# Patient Record
Sex: Female | Born: 1950
Health system: Southern US, Community
[De-identification: ages and names within clinical notes are randomized; demographics above are authoritative.]

## PROBLEM LIST (undated history)

## (undated) DIAGNOSIS — M1990 Unspecified osteoarthritis, unspecified site: Secondary | ICD-10-CM

## (undated) DIAGNOSIS — R112 Nausea with vomiting, unspecified: Secondary | ICD-10-CM

## (undated) DIAGNOSIS — E162 Hypoglycemia, unspecified: Secondary | ICD-10-CM

## (undated) DIAGNOSIS — Z0000 Encounter for general adult medical examination without abnormal findings: Secondary | ICD-10-CM

## (undated) DIAGNOSIS — R5381 Other malaise: Secondary | ICD-10-CM

## (undated) DIAGNOSIS — F419 Anxiety disorder, unspecified: Secondary | ICD-10-CM

## (undated) DIAGNOSIS — F319 Bipolar disorder, unspecified: Secondary | ICD-10-CM

## (undated) DIAGNOSIS — F32A Depression, unspecified: Secondary | ICD-10-CM

## (undated) DIAGNOSIS — Z9889 Other specified postprocedural states: Secondary | ICD-10-CM

## (undated) DIAGNOSIS — R5383 Other fatigue: Secondary | ICD-10-CM

## (undated) DIAGNOSIS — I10 Essential (primary) hypertension: Secondary | ICD-10-CM

## (undated) DIAGNOSIS — E039 Hypothyroidism, unspecified: Secondary | ICD-10-CM

## (undated) DIAGNOSIS — K219 Gastro-esophageal reflux disease without esophagitis: Secondary | ICD-10-CM

## (undated) DIAGNOSIS — E079 Disorder of thyroid, unspecified: Secondary | ICD-10-CM

## (undated) DIAGNOSIS — F4310 Post-traumatic stress disorder, unspecified: Secondary | ICD-10-CM

## (undated) HISTORY — PX: NECK SURGERY: SHX720

## (undated) HISTORY — DX: Other fatigue: R53.83

## (undated) HISTORY — DX: Other malaise: R53.81

## (undated) HISTORY — PX: EYE SURGERY: SHX253

## (undated) HISTORY — PX: TUBAL LIGATION: SHX77

## (undated) HISTORY — DX: Unspecified osteoarthritis, unspecified site: M19.90

## (undated) HISTORY — DX: Encounter for general adult medical examination without abnormal findings: Z00.00

## (undated) HISTORY — DX: Depression, unspecified: F32.A

## (undated) HISTORY — PX: DG 4TH DIGIT LEFT FOOT: HXRAD1649

## (undated) HISTORY — DX: Gastro-esophageal reflux disease without esophagitis: K21.9

---
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
## 2003-07-24 ENCOUNTER — Encounter: Admission: RE | Admit: 2003-07-24 | Discharge: 2003-07-24 | Payer: Self-pay | Admitting: Psychiatry

## 2003-12-08 ENCOUNTER — Ambulatory Visit (HOSPITAL_COMMUNITY): Payer: Self-pay | Admitting: Psychiatry

## 2004-01-22 ENCOUNTER — Ambulatory Visit (HOSPITAL_COMMUNITY): Payer: Self-pay | Admitting: Psychiatry

## 2004-05-22 ENCOUNTER — Ambulatory Visit (HOSPITAL_COMMUNITY): Payer: Self-pay | Admitting: Psychiatry

## 2004-07-24 ENCOUNTER — Ambulatory Visit (HOSPITAL_COMMUNITY): Payer: Self-pay | Admitting: Psychiatry

## 2008-02-28 ENCOUNTER — Ambulatory Visit (HOSPITAL_COMMUNITY): Payer: Self-pay | Admitting: Psychiatry

## 2009-09-05 ENCOUNTER — Ambulatory Visit (HOSPITAL_COMMUNITY): Payer: Self-pay | Admitting: Psychiatry

## 2009-10-12 ENCOUNTER — Ambulatory Visit (HOSPITAL_COMMUNITY): Payer: Self-pay | Admitting: Psychiatry

## 2009-12-03 ENCOUNTER — Ambulatory Visit (HOSPITAL_COMMUNITY): Payer: Self-pay | Admitting: Psychiatry

## 2010-01-28 ENCOUNTER — Ambulatory Visit (HOSPITAL_COMMUNITY): Payer: Self-pay | Admitting: Psychiatry

## 2010-03-25 ENCOUNTER — Encounter (HOSPITAL_COMMUNITY): Payer: Commercial Managed Care - PPO | Admitting: Psychiatry

## 2010-03-25 DIAGNOSIS — F3189 Other bipolar disorder: Secondary | ICD-10-CM

## 2010-05-20 ENCOUNTER — Encounter (HOSPITAL_COMMUNITY): Payer: Commercial Managed Care - PPO | Admitting: Psychiatry

## 2010-05-20 DIAGNOSIS — F3189 Other bipolar disorder: Secondary | ICD-10-CM

## 2010-07-15 ENCOUNTER — Encounter (HOSPITAL_COMMUNITY): Payer: Commercial Managed Care - PPO | Admitting: Psychiatry

## 2010-07-15 DIAGNOSIS — F3189 Other bipolar disorder: Secondary | ICD-10-CM

## 2010-09-16 ENCOUNTER — Encounter (HOSPITAL_COMMUNITY): Payer: Commercial Managed Care - PPO | Admitting: Psychiatry

## 2010-10-21 ENCOUNTER — Encounter (HOSPITAL_COMMUNITY): Payer: Commercial Managed Care - PPO | Admitting: Psychiatry

## 2010-10-21 DIAGNOSIS — F3189 Other bipolar disorder: Secondary | ICD-10-CM

## 2010-12-16 ENCOUNTER — Encounter (HOSPITAL_COMMUNITY): Payer: Commercial Managed Care - PPO | Admitting: Psychiatry

## 2010-12-22 ENCOUNTER — Emergency Department (HOSPITAL_BASED_OUTPATIENT_CLINIC_OR_DEPARTMENT_OTHER)
Admission: EM | Admit: 2010-12-22 | Discharge: 2010-12-22 | Disposition: A | Discharge status: Home / Self Care | Payer: 59 | Attending: Emergency Medicine | Admitting: Emergency Medicine

## 2010-12-22 ENCOUNTER — Emergency Department (INDEPENDENT_AMBULATORY_CARE_PROVIDER_SITE_OTHER): Payer: 59

## 2010-12-22 ENCOUNTER — Encounter: Payer: Self-pay | Admitting: *Deleted

## 2010-12-22 DIAGNOSIS — Z79899 Other long term (current) drug therapy: Secondary | ICD-10-CM | POA: Insufficient documentation

## 2010-12-22 DIAGNOSIS — E079 Disorder of thyroid, unspecified: Secondary | ICD-10-CM | POA: Insufficient documentation

## 2010-12-22 DIAGNOSIS — W19XXXA Unspecified fall, initial encounter: Secondary | ICD-10-CM

## 2010-12-22 DIAGNOSIS — W010XXA Fall on same level from slipping, tripping and stumbling without subsequent striking against object, initial encounter: Secondary | ICD-10-CM | POA: Insufficient documentation

## 2010-12-22 DIAGNOSIS — Y92009 Unspecified place in unspecified non-institutional (private) residence as the place of occurrence of the external cause: Secondary | ICD-10-CM | POA: Insufficient documentation

## 2010-12-22 DIAGNOSIS — F319 Bipolar disorder, unspecified: Secondary | ICD-10-CM | POA: Insufficient documentation

## 2010-12-22 DIAGNOSIS — M25539 Pain in unspecified wrist: Secondary | ICD-10-CM

## 2010-12-22 DIAGNOSIS — S63509A Unspecified sprain of unspecified wrist, initial encounter: Secondary | ICD-10-CM | POA: Insufficient documentation

## 2010-12-22 HISTORY — DX: Bipolar disorder, unspecified: F31.9

## 2010-12-22 HISTORY — DX: Disorder of thyroid, unspecified: E07.9

## 2010-12-22 HISTORY — DX: Hypoglycemia, unspecified: E16.2

## 2010-12-22 HISTORY — DX: Post-traumatic stress disorder, unspecified: F43.10

## 2010-12-22 NOTE — ED Provider Notes (Signed)
Medical screening examination/treatment/procedure(s) were performed by non-physician practitioner and as supervising physician I was immediately available for consultation/collaboration.  Hurman Horn, MD 12/22/10 [phone]

## 2010-12-22 NOTE — ED Notes (Signed)
Slipped in leaves and injured left wrist. + radial pulse. Moves fingers. Feels touch. Cap refill < 3 sec

## 2010-12-22 NOTE — ED Provider Notes (Signed)
History     CSN: [id number] Arrival date & time: 12/22/2010  5:11 PM   First MD Initiated Contact with Patient 12/22/10 1802      Chief Complaint  Patient presents with  . Wrist Injury    (Consider location/radiation/quality/duration/timing/severity/associated sxs/prior treatment) HPI Comments: Pt states that she fell in the leaves and landed on the wrist:pt states that she noticed some swelling in her wrist and it is sore with movement  Patient is a 60 y.o. female presenting with wrist injury. The history is provided by the patient. No language interpreter was used.  Wrist Injury  The incident occurred 3 to 5 hours ago. The incident occurred at home. The injury mechanism was a fall. The pain is present in the left wrist. The quality of the pain is described as aching. The pain is moderate. The pain has been constant since the incident. She reports no foreign bodies present. The symptoms are aggravated by movement.    Past Medical History  Diagnosis Date  . Bipolar disorder   . PTSD (post-traumatic stress disorder)   . Thyroid disease   . Hypoglycemia     History reviewed. No pertinent past surgical history.  History reviewed. No pertinent family history.  History  Substance Use Topics  . Smoking status: Not on file  . Smokeless tobacco: Not on file  . Alcohol Use:     OB History    Grav Para Term Preterm Abortions TAB SAB Ect Mult Living                  Review of Systems  All other systems reviewed and are negative.    Allergies  Codeine; Darvon; and Sulfa drugs cross reactors  Home Medications   Current Outpatient Rx  Name Route Sig Dispense Refill  . AMITRIPTYLINE HCL 50 MG PO TABS Oral Take 50 mg by mouth at bedtime.      . IBUPROFEN 200 MG PO TABS Oral Take 400-800 mg by mouth every 6 (six) hours as needed. For pain     . INFLUENZA VIRUS VACC SPLIT PF IM SUSP Intramuscular Inject into the muscle once.      Marland Kitchen LEVOTHYROXINE SODIUM 88 MCG PO TABS Oral  Take 88 mcg by mouth daily.      Marland Kitchen LITHIUM CARBONATE 300 MG PO CAPS Oral Take 300-600 mg by mouth 2 (two) times daily. Take 1 tab in the morning and 2 tabs at night     . PRESCRIPTION MEDICATION Topical Apply topically as needed. Psoriasis      . RISPERIDONE 2 MG PO TABS Oral Take 2 mg by mouth at bedtime.        BP 137/70  Pulse 100  Temp(Src) 98 F (36.7 C) (Oral)  Resp 18  Ht 5\' 7"  (1.702 m)  Wt 194 lb (87.998 kg)  BMI 30.38 kg/m2  SpO2 100%  Physical Exam  Nursing note and vitals reviewed. Constitutional: She is oriented to person, place, and time. She appears well-developed and well-nourished.  HENT:  Head: Normocephalic.  Cardiovascular: Normal rate and regular rhythm.   Pulmonary/Chest: Effort normal and breath sounds normal.  Musculoskeletal: Normal range of motion.       Mild swelling noted to the left wrist:pt has full WUJ:WJXBJY intact  Neurological: She is alert and oriented to person, place, and time.  Skin: Skin is warm and dry.  Psychiatric: She has a normal mood and affect.    ED Course  Procedures (including critical care time)  Labs Reviewed - No data to display Dg Wrist Complete Left  12/22/2010  *RADIOLOGY REPORT*  Clinical Data: Wrist pain secondary to a fall.  LEFT WRIST - COMPLETE 3+ VIEW  Comparison: None.  Findings: There is no fracture or dislocation.  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  IMPRESSION: No acute osseous abnormalities.  Original Report Authenticated By: Gwynn Burly, M.D.     1. Wrist sprain       MDM  Pt splinted for comfort:no acute findings noted on x-ray        Teressa Lower, NP 12/22/10 1823

## 2010-12-22 NOTE — Discharge Instructions (Signed)
Sprains Sprains are painful injuries to joints as a result of partial or complete tearing of ligaments. HOME CARE INSTRUCTIONS   For the first 24 hours, keep the injured limb raised on 2 pillows while lying down.   Apply ice bags about every 2 hours for 20 to 30 minutes, while awake, to the injured area for the first 24 hours. Then apply as directed by your caregiver. Place the ice in a plastic bag with a towel around it to prevent frostbite to the skin.   Only take over-the-counter or prescription medicines for pain, discomfort, or fever as directed by your caregiver.   If an ace bandage (a stretchy, elastic wrapping bandage) has been applied today, remove and reapply every 3 to 4 hours. Apply firm enough to keep swelling down. Donot apply tightly. Watch fingers or toes for swelling, bluish discoloration, coldness, numbness, or excessive pain. If any of these problems (symptoms) occur, remove the ace bandage and reapply it more loosely. Contact your caregiver or return to this location if these symptoms persist.  Persistent pain and inability to use the injured area for more than 2 to 3 days are warning signs. See a caregiver for a follow-up visit as soon as possible. A hairline fracture (broken bone) may not show on X-rays. Persistent pain and swelling indicate that further evaluation, use of crutches, and/or more X-rays are needed. X-rays may sometimes not show a small fracture until a week or ten days later. Make a follow-up appointment with your own caregiver or to whom we have referred you. A specialist in reading X-rays(radiologist) will re-read your X-rays. Make sure you know how to obtain your X-ray results. Do not assume everything is normal if you do not hear from [place]. SEEK IMMEDIATE MEDICAL CARE IF:  You develop severe pain or more swelling.   The pain is not controlled with medicine.   Your skin or nails below the injury turn blue or grey or feel cold or numb.  Document Released:  01/25/2000 Document Revised: 10/09/2010 Document Reviewed: 09/13/2007 [facility] Patient Information 2012 [place], [place].

## 2011-01-06 ENCOUNTER — Other Ambulatory Visit (HOSPITAL_COMMUNITY): Payer: Self-pay | Admitting: Psychiatry

## 2011-01-06 MED ORDER — RISPERIDONE 2 MG PO TABS: 2.0000 mg | ORAL_TABLET | Freq: Every day | ORAL | Status: DC

## 2011-01-06 MED ORDER — LITHIUM CARBONATE 300 MG PO TABS: 300.0000 mg | ORAL_TABLET | Freq: Every day | ORAL | Status: DC

## 2011-01-06 MED ORDER — AMITRIPTYLINE HCL 50 MG PO TABS: 50.0000 mg | ORAL_TABLET | Freq: Every day | ORAL | Status: DC

## 2011-01-13 ENCOUNTER — Ambulatory Visit (INDEPENDENT_AMBULATORY_CARE_PROVIDER_SITE_OTHER): Payer: 59 | Admitting: Psychiatry

## 2011-01-13 DIAGNOSIS — F3189 Other bipolar disorder: Secondary | ICD-10-CM

## 2011-01-13 DIAGNOSIS — Z79899 Other long term (current) drug therapy: Secondary | ICD-10-CM

## 2011-01-13 NOTE — Progress Notes (Signed)
Patient came for her followup appointment. She is going on her current medication. She is sleeping better. She has been compliant with her medication and reported no side effects. 3 weeks ago she hit her head and then complain of headache but it is resolving. Overall she has been stable on her medication. She reported no agitation anger or mood swings. She denies any angry episodes. She reported no tremors shakes or extrapyramidal side effects. She is seeing her primary care physician regularly.  Mental status examination Patient is pleasant calm cooperative. She described her mood is good and her affect is mood congruent. She maintained good eye contact. Her speech is fast but clear coherent. Her thought process is logical linear and goal-directed. She denies any active or passive suicidal thinking homicidal thinking. There no psychotic symptoms present. She denies any orderly or visual hallucination. She's alert and oriented x3. Her insight judgment and pulse control is okay  Assessment Bipolar disorder NOS  Plan I will continue her current medication which is Risperdal 2 mg at bedtime lithium 300 mg in the morning and 6 mg at bedtime amitriptyline 50 mg at bedtime. I had explained risks and benefits of medication and recommended to call if she has any question or concern. I will see her again in 2 months. I have also order routine CBC metabolic panel and lithium level.

## 2011-01-31 ENCOUNTER — Other Ambulatory Visit (HOSPITAL_COMMUNITY): Payer: Self-pay | Admitting: Physician Assistant

## 2011-01-31 DIAGNOSIS — F3189 Other bipolar disorder: Secondary | ICD-10-CM

## 2011-01-31 MED ORDER — LITHIUM CARBONATE 300 MG PO TABS: ORAL_TABLET | ORAL | Status: DC

## 2011-02-10 ENCOUNTER — Other Ambulatory Visit (HOSPITAL_COMMUNITY): Payer: Self-pay | Admitting: *Deleted

## 2011-02-10 DIAGNOSIS — F319 Bipolar disorder, unspecified: Secondary | ICD-10-CM

## 2011-02-13 MED ORDER — AMITRIPTYLINE HCL 50 MG PO TABS: 50.0000 mg | ORAL_TABLET | Freq: Every day | ORAL | Status: DC

## 2011-02-13 MED ORDER — RISPERIDONE 2 MG PO TABS: 2.0000 mg | ORAL_TABLET | Freq: Every day | ORAL | Status: DC

## 2011-03-10 ENCOUNTER — Other Ambulatory Visit (HOSPITAL_COMMUNITY): Payer: Self-pay | Admitting: Psychiatry

## 2011-03-11 ENCOUNTER — Other Ambulatory Visit (HOSPITAL_COMMUNITY): Payer: Self-pay | Admitting: Psychiatry

## 2011-03-17 ENCOUNTER — Ambulatory Visit (INDEPENDENT_AMBULATORY_CARE_PROVIDER_SITE_OTHER): Payer: 59 | Admitting: Psychiatry

## 2011-03-17 ENCOUNTER — Encounter (HOSPITAL_COMMUNITY): Payer: Self-pay | Admitting: Psychiatry

## 2011-03-17 VITALS — BP 108/84 | HR 96 | Ht 67.0 in | Wt 206.0 lb

## 2011-03-17 DIAGNOSIS — F3189 Other bipolar disorder: Secondary | ICD-10-CM

## 2011-03-17 MED ORDER — RISPERIDONE 2 MG PO TABS: 2.0000 mg | ORAL_TABLET | Freq: Every day | ORAL | Status: DC

## 2011-03-17 MED ORDER — LITHIUM CARBONATE 300 MG PO TABS: ORAL_TABLET | ORAL | Status: DC

## 2011-03-17 MED ORDER — AMITRIPTYLINE HCL 50 MG PO TABS: 50.0000 mg | ORAL_TABLET | Freq: Every day | ORAL | Status: DC

## 2011-03-17 NOTE — Progress Notes (Signed)
Patient came for her followup appointment. She's been stable on her medication. She's sleeping good however she is concerned about her weight gain. She has recently seen her primary care Dr. labs are drawn. Her WBC CBC and blood chemistries are within normal limits. Her lithium level is 0.9. She is sleeping fine. She denies any agitation anger or mood swings. She reported no side effects of medication. She is wondering if her thyroid needs to be changed since she has gained a lot of weight in 2 months.  Medical history Thyroid problem  Obesity  Vitals Reviewed  Mental status examination Patient is casually dressed and fairly groomed. She is morbid obese. She maintained good eye contact. Her speech is soft clear and coherent. Her thought process logical linear and goal-directed. Her attention and concentration is fair. She denies any active or passive suicidal thoughts or homicidal thoughts. She denies any auditory or visual hallucination. Her attention concentration is fair. She's alert and oriented x3. Her insight judgment and pulse control is okay.  Assessment Axis I bipolar disorder Axis II deferred Axis III thyroid problems, obesity Axis IV mild Axis V 65-70  Plan I have reviewed blood work which was done in December. Her labs are normal. Her lithium level is 0.9. She reported no side effects of medication. I have explained risks and benefits of medication in detail. She is scheduled to see her primary care physician to talk about adjusting the thyroid medication. If she continues to have issue with her weight we will try to lower her Risperdal in future visits. I have explained this to benefit of medication. I will see her again in 2 months.

## 2011-05-09 ENCOUNTER — Other Ambulatory Visit (HOSPITAL_COMMUNITY): Payer: Self-pay | Admitting: Psychiatry

## 2011-05-12 ENCOUNTER — Encounter (HOSPITAL_COMMUNITY): Payer: Self-pay | Admitting: Psychiatry

## 2011-05-12 ENCOUNTER — Ambulatory Visit (INDEPENDENT_AMBULATORY_CARE_PROVIDER_SITE_OTHER): Payer: 59 | Admitting: Psychiatry

## 2011-05-12 DIAGNOSIS — Z79899 Other long term (current) drug therapy: Secondary | ICD-10-CM

## 2011-05-12 DIAGNOSIS — F3189 Other bipolar disorder: Secondary | ICD-10-CM

## 2011-05-12 MED ORDER — RISPERIDONE 2 MG PO TABS: 2.0000 mg | ORAL_TABLET | Freq: Every day | ORAL | Status: DC

## 2011-05-12 MED ORDER — LITHIUM CARBONATE 300 MG PO CAPS: ORAL_CAPSULE | ORAL | Status: DC

## 2011-05-12 MED ORDER — LITHIUM CARBONATE 300 MG PO TABS: ORAL_TABLET | ORAL | Status: DC

## 2011-05-12 NOTE — Progress Notes (Signed)
Chief complaint Medication management and followup.  History of present illness Patient came today or her followup appointment. She's been compliant and stable on her medication.  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  She is relief as she is still working 40 hours a week .  She's sleeping good.  She denies any side effects of medication.  She reported her mood has been stable.  She denies any agitation anger or mood swings.  We are still waiting her blood work results from her primary care physician.  She has some tremors but she denies any other concern and does not want to take Cogentin.  She denies any paranoia or any hallucination.  Current psychiatric medication Lithium 300 mg one a morning and 2 at bedtime Risperdal 2 mg at bedtime  Medical history Thyroid problem  Obesity  Mental status examination Patient is casually dressed and fairly groomed. She is pleasant, calm and cooperative. She maintained good eye contact. Her speech is soft clear and coherent. Her thought process logical linear and goal-directed. Her attention and concentration is fair. She denies any active or passive suicidal thoughts or homicidal thoughts. She denies any auditory or visual hallucination. Her attention concentration is fair. She's alert and oriented x3. Her insight judgment and pulse control is okay.  Assessment Axis I bipolar disorder Axis II deferred Axis III thyroid problems, obesity Axis IV mild Axis V 65-70  Plan I will continue her current medication which is lithium 300 mg in the morning and 2 at bedtime and Risperdal 2 mg at bedtime. I recommend to have blood work done since we had not results from her primary care physician and even received it'll be 8 months old .  Patient acknowledged .  CBC , CMP and lithium level ordered .  I recommended to call us if she has any question or concern about the medication otherwise I will see her in 2 months.

## 2011-07-14 ENCOUNTER — Other Ambulatory Visit (HOSPITAL_COMMUNITY): Payer: Self-pay | Admitting: Psychiatry

## 2011-07-14 DIAGNOSIS — F3189 Other bipolar disorder: Secondary | ICD-10-CM

## 2011-07-21 ENCOUNTER — Ambulatory Visit (INDEPENDENT_AMBULATORY_CARE_PROVIDER_SITE_OTHER): Payer: 59 | Admitting: Internal Medicine

## 2011-07-21 ENCOUNTER — Encounter: Payer: Self-pay | Admitting: Internal Medicine

## 2011-07-21 ENCOUNTER — Telehealth: Payer: Self-pay | Admitting: Internal Medicine

## 2011-07-21 VITALS — BP 116/80 | HR 86 | Temp 98.4°F | Resp 18 | Ht 65.5 in | Wt 193.0 lb

## 2011-07-21 DIAGNOSIS — Z1239 Encounter for other screening for malignant neoplasm of breast: Secondary | ICD-10-CM

## 2011-07-21 DIAGNOSIS — Z79899 Other long term (current) drug therapy: Secondary | ICD-10-CM

## 2011-07-21 DIAGNOSIS — F319 Bipolar disorder, unspecified: Secondary | ICD-10-CM

## 2011-07-21 DIAGNOSIS — E039 Hypothyroidism, unspecified: Secondary | ICD-10-CM

## 2011-07-21 LAB — CBC WITH DIFFERENTIAL/PLATELET
Basophils Relative: 0 % (ref 0–1)
Eosinophils Absolute: 0.3 10*3/uL (ref 0.0–0.7)
Eosinophils Relative: 4 % (ref 0–5)
HCT: 41.1 % (ref 36.0–46.0)
Hemoglobin: 13.9 g/dL (ref 12.0–15.0)
MCH: 31 pg (ref 26.0–34.0)
MCHC: 33.8 g/dL (ref 30.0–36.0)
MCV: 91.7 fL (ref 78.0–100.0)
Monocytes Absolute: 0.6 10*3/uL (ref 0.1–1.0)
Monocytes Relative: 8 % (ref 3–12)

## 2011-07-21 LAB — LIPID PANEL
Cholesterol: 202 mg/dL — ABNORMAL HIGH (ref 0–200)
HDL: 44 mg/dL (ref >39)

## 2011-07-21 LAB — BASIC METABOLIC PANEL
BUN: 12 mg/dL (ref 6–23)
Calcium: 10.1 mg/dL (ref 8.4–10.5)
Creat: 1.15 mg/dL — ABNORMAL HIGH (ref 0.50–1.10)
Glucose, Bld: 100 mg/dL — ABNORMAL HIGH (ref 70–99)
Potassium: 4.7 mEq/L (ref 3.5–5.3)

## 2011-07-21 LAB — HEPATIC FUNCTION PANEL
ALT: 19 U/L (ref 0–35)
AST: 15 U/L (ref 0–37)
Alkaline Phosphatase: 98 U/L (ref 39–117)
Bilirubin, Direct: 0.1 mg/dL (ref 0.0–0.3)
Total Bilirubin: 0.4 mg/dL (ref 0.3–1.2)

## 2011-07-21 LAB — T4, FREE: Free T4: 1.05 ng/dL (ref 0.80–1.80)

## 2011-07-21 LAB — TSH: TSH: 1.255 u[IU]/mL (ref 0.350–4.500)

## 2011-07-21 NOTE — Telephone Encounter (Signed)
Lab order entered for December 2013. 

## 2011-07-21 NOTE — Patient Instructions (Signed)
Please schedule tsh,free t4 (hypothyroidism) prior to next visit

## 2011-07-22 LAB — HEMOGLOBIN A1C
Hgb A1c MFr Bld: 5.7 % — ABNORMAL HIGH (ref <5.7)
Mean Plasma Glucose: 117 mg/dL — ABNORMAL HIGH (ref <117)

## 2011-07-27 DIAGNOSIS — F319 Bipolar disorder, unspecified: Secondary | ICD-10-CM | POA: Insufficient documentation

## 2011-07-27 DIAGNOSIS — E039 Hypothyroidism, unspecified: Secondary | ICD-10-CM | POA: Insufficient documentation

## 2011-07-27 NOTE — Assessment & Plan Note (Signed)
Obtain tsh/free t4 

## 2011-07-27 NOTE — Progress Notes (Signed)
  Subjective:    Patient ID: Margaret [name], female    DOB: [birth year], 61 y.o.   MRN: [id number]  HPI Pt presents to clinic for followup of multiple medical problems. Sees psychiatry for bipolar taking lithium and states risperdal recently added. Takes ibuprofen prn without gi adverse effect. Declines screening colonoscopy.  Willing to schedule mammogram and will consider future pap smears. Believes tetanus booster ~2y ago. H/o hypothyroidism without current sx's of hypo or hyperthyroidism. Needs labs drawn here for her upcoming psychiatry appt. No active complaint  Past Medical History  Diagnosis Date  . Bipolar disorder   . PTSD (post-traumatic stress disorder)   . Thyroid disease   . Hypoglycemia    Past Surgical History  Procedure Date  . Tubal ligation     reversal    reports that she has quit smoking. She has never used smokeless tobacco. She reports that she does not drink alcohol or use illicit drugs. family history includes Cancer in her paternal grandmother; Diabetes in her mother; Esophageal cancer in her maternal grandmother; Heart failure in her mother; Hypertension in her brother; Melanoma in her brother; Parkinsonism in her father; and Prostate cancer in her father.  There is no history of Breast cancer and Colon cancer. Allergies  Allergen Reactions  . Codeine Nausea Only  . Darvon     Hallucinations   . Sulfa Drugs Cross Reactors Nausea And Vomiting      Review of Systems  Respiratory: Negative for cough and shortness of breath.   Cardiovascular: Negative for chest pain.  All other systems reviewed and are negative.       Objective:   Physical Exam  Nursing note and vitals reviewed. Constitutional: She appears well-developed and well-nourished. No distress.  HENT:  Head: Normocephalic and atraumatic.  Right Ear: External ear normal.  Left Ear: External ear normal.  Eyes: Conjunctivae are normal. No scleral icterus.  Neck: Neck supple. No thyromegaly present.   Cardiovascular: Normal rate, regular rhythm and normal heart sounds.  Exam reveals no gallop and no friction rub.   No murmur heard. Pulmonary/Chest: Effort normal and breath sounds normal. No respiratory distress. She has no wheezes. She has no rales.  Lymphadenopathy:    She has no cervical adenopathy.  Neurological: She is alert.  Skin: Skin is warm and dry. She is not diaphoretic.  Psychiatric: She has a normal mood and affect.          Assessment & Plan:

## 2011-07-27 NOTE — Assessment & Plan Note (Signed)
Obtain lithium level, chem7, lft and a1c. Cautioned re using nsaids and lithium together. States understanding

## 2011-07-28 ENCOUNTER — Inpatient Hospital Stay (HOSPITAL_BASED_OUTPATIENT_CLINIC_OR_DEPARTMENT_OTHER): Admission: RE | Admit: 2011-07-28 | Payer: 59 | Source: Ambulatory Visit

## 2011-07-28 ENCOUNTER — Ambulatory Visit: Payer: 59 | Admitting: Internal Medicine

## 2011-07-29 ENCOUNTER — Inpatient Hospital Stay (HOSPITAL_BASED_OUTPATIENT_CLINIC_OR_DEPARTMENT_OTHER): Admission: RE | Admit: 2011-07-29 | Payer: 59 | Source: Ambulatory Visit

## 2011-07-30 ENCOUNTER — Ambulatory Visit (HOSPITAL_BASED_OUTPATIENT_CLINIC_OR_DEPARTMENT_OTHER): Payer: 59

## 2011-07-30 ENCOUNTER — Other Ambulatory Visit (HOSPITAL_COMMUNITY): Payer: Self-pay | Admitting: Psychiatry

## 2011-07-31 ENCOUNTER — Ambulatory Visit (HOSPITAL_BASED_OUTPATIENT_CLINIC_OR_DEPARTMENT_OTHER)
Admission: RE | Admit: 2011-07-31 | Discharge: 2011-07-31 | Disposition: A | Discharge status: Home / Self Care | Payer: 59 | Source: Ambulatory Visit | Attending: Internal Medicine | Admitting: Internal Medicine

## 2011-07-31 DIAGNOSIS — Z1239 Encounter for other screening for malignant neoplasm of breast: Secondary | ICD-10-CM

## 2011-07-31 DIAGNOSIS — Z1231 Encounter for screening mammogram for malignant neoplasm of breast: Secondary | ICD-10-CM | POA: Insufficient documentation

## 2011-08-11 ENCOUNTER — Ambulatory Visit (HOSPITAL_COMMUNITY): Payer: 59 | Admitting: Psychiatry

## 2011-09-01 ENCOUNTER — Ambulatory Visit (INDEPENDENT_AMBULATORY_CARE_PROVIDER_SITE_OTHER): Payer: 59 | Admitting: Psychiatry

## 2011-09-01 ENCOUNTER — Encounter (HOSPITAL_COMMUNITY): Payer: Self-pay | Admitting: Psychiatry

## 2011-09-01 DIAGNOSIS — F319 Bipolar disorder, unspecified: Secondary | ICD-10-CM

## 2011-09-01 DIAGNOSIS — F3189 Other bipolar disorder: Secondary | ICD-10-CM

## 2011-09-01 LAB — LITHIUM LEVEL: Lithium Lvl: 1.04 mEq/L (ref 0.80–1.40)

## 2011-09-01 MED ORDER — LITHIUM CARBONATE 300 MG PO CAPS: ORAL_CAPSULE | ORAL | Status: DC

## 2011-09-01 MED ORDER — RISPERIDONE 2 MG PO TABS: 2.0000 mg | ORAL_TABLET | Freq: Every day | ORAL | Status: DC

## 2011-09-01 MED ORDER — AMITRIPTYLINE HCL 50 MG PO TABS: 50.0000 mg | ORAL_TABLET | Freq: Every day | ORAL | Status: DC

## 2011-09-01 NOTE — Progress Notes (Signed)
Chief complaint I have noticed more agitation and mood swing.    History of present illness Patient came today or her followup appointment.  She has notice more mood swing agitation and anger in past few weeks.  She also endorse frequent urination however she believed due to stress at work.  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  Now she is working to job .  She does not like her first Job and reported stress there.  She admitted poor sleep and racing thoughts.  She denies any side effects other than noticed more frequent urination .  She admitted getting easily irritable and frustrated.  However she denies any active or passive suicidal thoughts or homicidal thoughts.  She had blood work which was done in June .  Her hemoglobin A1c is 5.7  , her TSH is 1.25 and cholesterol 200.  Her creatinine is 1.15 and BUN is 12.  Her hepatic function was normal.  However she did not have any lithium level.  Patient denies any tremors or shakes.  Current psychiatric medication Lithium 300 mg one a morning and 2 at bedtime Risperdal 2 mg at bedtime  Medical history Thyroid problem  Obesity  Mental status examination Patient is casually dressed and fairly groomed. She is cooperative and maintained fair eye contact.  She described her mood sometimes irritable and her affect is mood appropriate.  She denies any active or passive suicidal thoughts or homicidal thoughts.  Her attention and concentration is fair.  Her speech is fast but relevant.  There were no delusion or hallucination present at this time.  She denies any tremors or shakes.  Her thought processes fast.  She's alert and oriented x3.  Her insight judgment and pulse control is fair.  Assessment Axis I bipolar disorder Axis II deferred Axis III thyroid problems, obesity Axis IV mild Axis V 65-70  Plan I discuss the patient about the side effects , response to the medication , blood results especially creatinine.  We do not have  any lithium level .  I believe patient may have some side effects of lithium causing frequent urination .  We will do lithium level since it has been not done.  I also discussed with her option regarding medication.  We discuss about Lamictal however we will get lithium level first.  For now we'll continue her current psychiatric medication however once she had a lithium level I will see her again in 3 weeks.  We may consider either increasing Risperdal or change in lithium to Lamictal.  I recommend to call us if she is any question or concern or if she feels worsening of the symptoms.  I will see her again in 3 weeks.  Time spent 30 minutes.

## 2011-09-04 ENCOUNTER — Telehealth: Payer: Self-pay | Admitting: Internal Medicine

## 2011-09-04 NOTE — Telephone Encounter (Signed)
She can schedule OV to have ears checked & we can irrigate with MD request/SLS

## 2011-09-05 NOTE — Telephone Encounter (Signed)
Patient returned phone call. She states that she will call back when ready to schedule appointment

## 2011-09-15 ENCOUNTER — Ambulatory Visit (HOSPITAL_COMMUNITY): Payer: Self-pay | Admitting: Psychiatry

## 2011-09-29 ENCOUNTER — Encounter (HOSPITAL_COMMUNITY): Payer: Self-pay | Admitting: Psychiatry

## 2011-09-29 ENCOUNTER — Ambulatory Visit (INDEPENDENT_AMBULATORY_CARE_PROVIDER_SITE_OTHER): Payer: 59 | Admitting: Psychiatry

## 2011-09-29 VITALS — BP 137/77 | HR 82 | Wt 199.2 lb

## 2011-09-29 DIAGNOSIS — F3189 Other bipolar disorder: Secondary | ICD-10-CM

## 2011-09-29 MED ORDER — RISPERIDONE 2 MG PO TABS: 2.0000 mg | ORAL_TABLET | Freq: Every day | ORAL | Status: DC

## 2011-09-29 MED ORDER — LITHIUM CARBONATE 300 MG PO CAPS: ORAL_CAPSULE | ORAL | Status: DC

## 2011-09-29 MED ORDER — AMITRIPTYLINE HCL 50 MG PO TABS: 50.0000 mg | ORAL_TABLET | Freq: Every day | ORAL | Status: DC

## 2011-09-29 NOTE — Progress Notes (Signed)
Chief complaint I stress at work but I'm doing good.     History of present illness Patient came today or her followup appointment.  She endorse increased stress at work but overall her anger and mood has been better from the past.  She's compliant with the medication and reported no side effects.  Her lithium level is 1.1 on July 22.  It is therapeutic.  Patient admitted less agitation and anger .  She still has frequent urination at night but she is very resistant to change her lithium.  She's been taking lithium since 1970s and had a very good response.  Apart from frequent urination she sleeping better.  She's working to job and has been very busy.  She denies recent agitation anger mood swing.  She denies any active or passive suicidal thoughts.  She had blood work in June 2030 which shows hemoglobin A1c is 5.7  , her TSH is 1.25 and cholesterol 200.  Her creatinine is 1.15 and BUN is 12.  Her hepatic function was normal.  She's not drinking or using any illegal substance.  She doesn't have any tremors or shakes. We had discussed on the last visit that if she continued to have frequent urination then we can consider either increasing Risperdal or switching to Lamictal however patient does not want to change her medication.  Current psychiatric medication Lithium 300 mg one a morning and 2 at bedtime Risperdal 2 mg at bedtime Amitriptyline 50 mg at bedtime.  Past psychiatric history Patient has a long history of bipolar disorder.  She is been admitted twice due to manic symptoms.  Her last psychiatric admission was 10 years ago.  In the past she had tried Abilify with limited response.  She's been taking lithium since 1970s.  She denies any history of suicidal attempt.  Medical history Thyroid problem  Obesity  Psychosocial history Patient has been separated since 2005.  She is single and lives alone.  Mental status examination Patient is casually dressed and fairly groomed. She is  cooperative and maintained fair eye contact.  She described her mood good and her affect is labile.  Her speech is fast but clear and coherent.  Her thought process is logical linear and goal-directed.  Her attention and concentration is distracted at times but overall she is relevant in conversation.  She's alert and oriented x3.  There were no delusion or psychotic symptoms present at this time.  She denies any active or passive suicidal thoughts or homicidal thoughts.  She's oriented x3.  Her insight judgment and pulse control is okay.  Assessment Axis I bipolar disorder Axis II deferred Axis III thyroid problems, obesity Axis IV mild Axis V 65-70  Plan I reviewed chart, last progress note and recent lithium level.  Patient is comfortable on her current psychiatric medication.  She does not want to change her lithium.  We'll continue her current psychiatric medication.  She has therapeutic level of lithium.  I recommend to call us if she is a question or concern about the medication or if she feels worsening of the symptoms.  I will see her again in 2 months.  Time spent 30 minutes.  Portion of this note is generated with voice dictation software and may contain typographical error. Marland Kitchen

## 2011-10-01 ENCOUNTER — Other Ambulatory Visit (HOSPITAL_COMMUNITY): Payer: Self-pay | Admitting: Psychiatry

## 2011-12-01 ENCOUNTER — Ambulatory Visit (HOSPITAL_COMMUNITY): Payer: Self-pay | Admitting: Psychiatry

## 2011-12-08 ENCOUNTER — Ambulatory Visit: Payer: Self-pay | Admitting: Family

## 2011-12-15 ENCOUNTER — Ambulatory Visit (INDEPENDENT_AMBULATORY_CARE_PROVIDER_SITE_OTHER): Payer: 59 | Admitting: Psychiatry

## 2011-12-15 ENCOUNTER — Encounter (HOSPITAL_COMMUNITY): Payer: Self-pay | Admitting: Psychiatry

## 2011-12-15 VITALS — BP 122/75 | HR 86 | Wt 200.4 lb

## 2011-12-15 DIAGNOSIS — F319 Bipolar disorder, unspecified: Secondary | ICD-10-CM

## 2011-12-15 DIAGNOSIS — F3189 Other bipolar disorder: Secondary | ICD-10-CM

## 2011-12-15 MED ORDER — AMITRIPTYLINE HCL 50 MG PO TABS: 50.0000 mg | ORAL_TABLET | Freq: Every day | ORAL | Status: DC

## 2011-12-15 MED ORDER — LITHIUM CARBONATE 300 MG PO CAPS: ORAL_CAPSULE | ORAL | Status: DC

## 2011-12-15 MED ORDER — RISPERIDONE 2 MG PO TABS: 2.0000 mg | ORAL_TABLET | Freq: Every day | ORAL | Status: DC

## 2011-12-15 NOTE — Progress Notes (Signed)
Chief complaint I have some time jerks in my right arm but I'm doing better on the current medication.     History of present illness Patient is 61 year old Caucasian female who came for her followup appointment.  She's been compliant with the medication.  She reports better mood despite his stress at work.  She has noticed some time jerking movements in her right arm which happens few times in the past.  She is not concern about these moments but wondering what is the cause.  I explain some time long use of lithium and Risperdal may give these jerky movements.  Patient does not want to stop her lithium and Risperdal.  She have felt best .  These medication.  I also recommend to see neurologist if the moments continued to get more intense and frequent.  Patient acknowledged.  She like to get retired next year and hoping at that time she may come off from the medication.  She sleeping better.  She denies any recent agitation anger mood swing.  She's also concerned about her left ear hearing loss .  She was told that she has psoriasis and it runs in her family.  Patient does not have any additional appointment with her rheumatologist for further workup.  Her last lithium level was done in July 2013 which was 1.1.  Her creatinine was 1.15 and her BUN is 12.  Her hemoglobin A1c is 5.7 and TSH 1.25 at bedtime.  Patient wants to keep her current medication.  She's not drinking or using any illegal substance.  Current psychiatric medication Lithium 300 mg one a morning and 2 at bedtime Risperdal 2 mg at bedtime Amitriptyline 50 mg at bedtime.  Past psychiatric history Patient has a long history of bipolar disorder.  She is been admitted twice due to manic symptoms.  Her last psychiatric admission was 10 years ago.  In the past she had tried Abilify with limited response.  She's been taking lithium since 1970s.  She denies any history of suicidal attempt.  Medical history Thyroid problem   Obesity  Psychosocial history Patient has been separated since 2005.  She is single and lives alone.  Patient worked at [employer].  Mental status examination Patient is casually dressed and fairly groomed. She is cooperative and maintained fair eye contact.  She is relevant in conversation.  She described her mood is good and her affect is mood appropriate.  Her speech is soft clear and coherent.  Her thought process logical linear and goal-directed.  I do not notice any tremors or shakes at this time.  Her thought processes logical goal-directed.  Her attention and concentration is fair.  She's alert and oriented x3.  She denies any active or passive suicidal thoughts or homicidal thoughts.  She denies any auditory or visual hallucination.  She's alert and oriented x3.  There no psychotic symptoms present at this time.  Her insight judgment and impulse control is okay.  Assessment Axis I bipolar disorder Axis II deferred Axis III thyroid problems, obesity Axis IV mild Axis V 65-70  Plan I encourage her to discuss her primary care physician and do further workup for her right ear hearing loss.  I also encouraged her to see neurologist for jerky movements.  However patient like to monitor if the moments get worse .  She like to continue her current psychiatric medication.  I explained the risk and benefits of medication in detail.  I recommend to call  us if she is any question or concern if she feels worsening of the symptom.  I will see him again in 2 months.  We will consider lithium level and creatinine on her next visit.    Portion of this note is generated with voice dictation software and may contain typographical error. Marland Kitchen

## 2011-12-23 ENCOUNTER — Encounter: Payer: Self-pay | Admitting: Internal Medicine

## 2012-01-05 ENCOUNTER — Ambulatory Visit (INDEPENDENT_AMBULATORY_CARE_PROVIDER_SITE_OTHER): Payer: 59 | Admitting: Internal Medicine

## 2012-01-05 ENCOUNTER — Other Ambulatory Visit (HOSPITAL_COMMUNITY)
Admission: RE | Admit: 2012-01-05 | Discharge: 2012-01-05 | Disposition: A | Discharge status: Home / Self Care | Payer: 59 | Source: Ambulatory Visit | Attending: Internal Medicine | Admitting: Internal Medicine

## 2012-01-05 ENCOUNTER — Encounter: Payer: Self-pay | Admitting: Internal Medicine

## 2012-01-05 VITALS — BP 118/76 | HR 84 | Temp 97.8°F | Resp 16 | Wt 201.2 lb

## 2012-01-05 DIAGNOSIS — Z124 Encounter for screening for malignant neoplasm of cervix: Secondary | ICD-10-CM

## 2012-01-05 DIAGNOSIS — E7800 Pure hypercholesterolemia, unspecified: Secondary | ICD-10-CM

## 2012-01-05 DIAGNOSIS — E039 Hypothyroidism, unspecified: Secondary | ICD-10-CM

## 2012-01-05 DIAGNOSIS — Z01419 Encounter for gynecological examination (general) (routine) without abnormal findings: Secondary | ICD-10-CM | POA: Insufficient documentation

## 2012-01-05 DIAGNOSIS — Z79899 Other long term (current) drug therapy: Secondary | ICD-10-CM

## 2012-01-05 DIAGNOSIS — E785 Hyperlipidemia, unspecified: Secondary | ICD-10-CM

## 2012-01-05 DIAGNOSIS — F319 Bipolar disorder, unspecified: Secondary | ICD-10-CM

## 2012-01-05 NOTE — Assessment & Plan Note (Signed)
Obtain fasting lipid profile 

## 2012-01-05 NOTE — Assessment & Plan Note (Signed)
Obtain TSH and free T4 

## 2012-01-05 NOTE — Assessment & Plan Note (Signed)
Obtain Chem-7 because of lithium use

## 2012-01-05 NOTE — Progress Notes (Signed)
  Subjective:    Patient ID: Margaret [name], female    DOB: [birth year], 61 y.o.   MRN: [id number]  HPI Pt presents to clinic for followup of multiple medical problems and for Pap smear. History of hypothyroidism and due for recheck of TSH and free T4. Bipolar followed by psychiatry and maintained on lithium. No active complaint  Past Medical History  Diagnosis Date  . Bipolar disorder   . PTSD (post-traumatic stress disorder)   . Thyroid disease   . Hypoglycemia    Past Surgical History  Procedure Date  . Tubal ligation     reversal    reports that she has quit smoking. She has never used smokeless tobacco. She reports that she does not drink alcohol or use illicit drugs. family history includes Cancer in her paternal grandmother; Diabetes in her mother; Esophageal cancer in her maternal grandmother; Heart failure in her mother; Hypertension in her brother; Melanoma in her brother; Parkinsonism in her father; and Prostate cancer in her father.  There is no history of Breast cancer and Colon cancer. Allergies  Allergen Reactions  . Codeine Nausea Only  . Darvon     Hallucinations   . Sulfa Drugs Cross Reactors Nausea And Vomiting     Review of Systems see history of present illness     Objective:   Physical Exam  Nursing note and vitals reviewed. Constitutional: She appears well-developed and well-nourished. No distress.  HENT:  Head: Normocephalic and atraumatic.  Eyes: Conjunctivae normal are normal. No scleral icterus.  Genitourinary:       With female nurse escort exam was performed. External genitalia within normal limits. Speculum exam performed with suboptimal visualization of cervix however patient notes pain during today's exam as well as all past pelvic exams. No further speculum maneuvering was attempted. Pap smear sample obtained from what appeared to be distal cervix. Also breast exam performed. No axillary adenopathy, nipple discharge nipple retraction or obvious breast  mass appreciated.  Neurological: She is alert.  Skin: Skin is warm and dry. She is not diaphoretic.  Psychiatric: She has a normal mood and affect.          Assessment & Plan:

## 2012-01-05 NOTE — Assessment & Plan Note (Signed)
Pap smear pending.

## 2012-01-06 LAB — TSH: TSH: 1.252 u[IU]/mL (ref 0.350–4.500)

## 2012-01-06 LAB — BASIC METABOLIC PANEL
Calcium: 10.5 mg/dL (ref 8.4–10.5)
Creat: 1.14 mg/dL — ABNORMAL HIGH (ref 0.50–1.10)

## 2012-01-06 LAB — LIPID PANEL
Cholesterol: 167 mg/dL (ref 0–200)
HDL: 47 mg/dL (ref >39)
Triglycerides: 98 mg/dL (ref <150)

## 2012-01-06 LAB — T4, FREE: Free T4: 1.38 ng/dL (ref 0.80–1.80)

## 2012-01-14 ENCOUNTER — Other Ambulatory Visit: Payer: Self-pay | Admitting: *Deleted

## 2012-01-14 MED ORDER — LEVOTHYROXINE SODIUM 88 MCG PO TABS: 88.0000 ug | ORAL_TABLET | Freq: Every day | ORAL | Status: DC

## 2012-01-14 NOTE — Telephone Encounter (Signed)
Rx to pharmacy/[facility] 

## 2012-01-19 ENCOUNTER — Ambulatory Visit: Payer: 59 | Admitting: Internal Medicine

## 2012-02-11 HISTORY — PX: OTHER SURGICAL HISTORY: SHX169

## 2012-03-01 ENCOUNTER — Other Ambulatory Visit (HOSPITAL_COMMUNITY): Payer: Self-pay | Admitting: *Deleted

## 2012-03-01 ENCOUNTER — Ambulatory Visit (HOSPITAL_COMMUNITY): Payer: Self-pay | Admitting: Psychiatry

## 2012-03-01 DIAGNOSIS — F3189 Other bipolar disorder: Secondary | ICD-10-CM

## 2012-03-01 MED ORDER — LITHIUM CARBONATE 300 MG PO CAPS: ORAL_CAPSULE | ORAL | Status: DC

## 2012-03-08 ENCOUNTER — Ambulatory Visit (INDEPENDENT_AMBULATORY_CARE_PROVIDER_SITE_OTHER): Payer: 59 | Admitting: Psychiatry

## 2012-03-08 ENCOUNTER — Encounter (HOSPITAL_COMMUNITY): Payer: Self-pay | Admitting: Psychiatry

## 2012-03-08 DIAGNOSIS — F319 Bipolar disorder, unspecified: Secondary | ICD-10-CM

## 2012-03-08 DIAGNOSIS — F3189 Other bipolar disorder: Secondary | ICD-10-CM

## 2012-03-08 MED ORDER — AMITRIPTYLINE HCL 50 MG PO TABS: 50.0000 mg | ORAL_TABLET | Freq: Every day | ORAL | Status: DC

## 2012-03-08 MED ORDER — RISPERIDONE 2 MG PO TABS: 2.0000 mg | ORAL_TABLET | Freq: Every day | ORAL | Status: DC

## 2012-03-08 NOTE — Progress Notes (Signed)
Chief complaint I'm doing better on the medication.  I need refills.     History of present illness Patient is 62 year old Caucasian female who came for her followup appointment.  She's been compliant with the medication.  She denies any side effects of medication.  She does not have any jerky movements and past few months.  She stop using ibuprofen on a daily basis due to the concern of kidney and lithium.  She has recently seen her primary care physician and her thyroid was normal.  Overall her mood has been stable.  She denies any agitation anger mood swing.  She denies any tremors or shakes.  She sleeps better.  She has some stress from her work but there has been no issues recently. She's not drinking or using any illegal substance.  Current psychiatric medication Lithium 300 mg one a morning and 2 at bedtime Risperdal 2 mg at bedtime Amitriptyline 50 mg at bedtime.  Past psychiatric history Patient has a long history of bipolar disorder.  She is been admitted twice due to manic symptoms.  Her last psychiatric admission was 10 years ago.  In the past she had tried Abilify with limited response.  She's been taking lithium since 1970s.  She denies any history of suicidal attempt.  Medical history Thyroid problem  Obesity  Psychosocial history Patient has been separated since 2005.  She is single and lives alone.  Patient worked at [employer].  Mental status examination Patient is casually dressed and fairly groomed. She is cooperative and maintained fair eye contact.  She is relevant in conversation.  She described her mood is good and her affect is mood appropriate.  Her speech is soft clear and coherent.  Her thought process logical linear and goal-directed.  I do not notice any tremors or shakes at this time.  Her thought processes logical goal-directed.  Her attention and concentration is fair.  She's alert and oriented x3.  She denies any active or passive  suicidal thoughts or homicidal thoughts.  She denies any auditory or visual hallucination.  She's alert and oriented x3.  There no psychotic symptoms present at this time.  Her insight judgment and impulse control is okay.  Assessment Axis I bipolar disorder Axis II deferred Axis III thyroid problems, obesity Axis IV mild Axis V 65-70  Plan I will continue her current psychiatric medication.  I encourage her to call us if she is any question or concern if he feel worsening of the symptom.  Risk and benefits of medication explain in detail.  Will do lithium level on her next visit.  I will see her again in 3 months.  Recommend call when necessary.  Portion of this note is generated with voice dictation software and may contain typographical error. Marland Kitchen

## 2012-04-01 ENCOUNTER — Other Ambulatory Visit (HOSPITAL_COMMUNITY): Payer: Self-pay | Admitting: Psychiatry

## 2012-04-01 NOTE — Telephone Encounter (Signed)
Given script on 03/08/12 with two additional refills. Too soon to refill

## 2012-04-28 ENCOUNTER — Other Ambulatory Visit (HOSPITAL_COMMUNITY): Payer: Self-pay | Admitting: Psychiatry

## 2012-04-29 ENCOUNTER — Other Ambulatory Visit (HOSPITAL_COMMUNITY): Payer: Self-pay | Admitting: Psychiatry

## 2012-04-29 DIAGNOSIS — F3189 Other bipolar disorder: Secondary | ICD-10-CM

## 2012-04-29 MED ORDER — LITHIUM CARBONATE 300 MG PO CAPS: ORAL_CAPSULE | ORAL | Status: DC

## 2012-05-21 ENCOUNTER — Encounter: Payer: Self-pay | Admitting: Internal Medicine

## 2012-05-31 ENCOUNTER — Other Ambulatory Visit (HOSPITAL_COMMUNITY): Payer: Self-pay | Admitting: Psychiatry

## 2012-05-31 DIAGNOSIS — F3189 Other bipolar disorder: Secondary | ICD-10-CM

## 2012-06-07 ENCOUNTER — Ambulatory Visit (INDEPENDENT_AMBULATORY_CARE_PROVIDER_SITE_OTHER): Payer: 59 | Admitting: Psychiatry

## 2012-06-07 ENCOUNTER — Encounter (HOSPITAL_COMMUNITY): Payer: Self-pay | Admitting: Psychiatry

## 2012-06-07 VITALS — BP 120/80 | HR 88 | Wt 207.0 lb

## 2012-06-07 DIAGNOSIS — F3189 Other bipolar disorder: Secondary | ICD-10-CM

## 2012-06-07 DIAGNOSIS — F319 Bipolar disorder, unspecified: Secondary | ICD-10-CM

## 2012-06-07 DIAGNOSIS — Z79899 Other long term (current) drug therapy: Secondary | ICD-10-CM

## 2012-06-07 LAB — CBC WITH DIFFERENTIAL/PLATELET
Basophils Absolute: 0.1 10*3/uL (ref 0.0–0.1)
HCT: 39.2 % (ref 36.0–46.0)
Hemoglobin: 13.3 g/dL (ref 12.0–15.0)
Lymphocytes Relative: 19 % (ref 12–46)
Monocytes Absolute: 0.8 10*3/uL (ref 0.1–1.0)
Neutro Abs: 6 10*3/uL (ref 1.7–7.7)
RBC: 4.36 MIL/uL (ref 3.87–5.11)
RDW: 14 % (ref 11.5–15.5)
WBC: 9.1 10*3/uL (ref 4.0–10.5)

## 2012-06-07 LAB — HEMOGLOBIN A1C: Mean Plasma Glucose: 114 mg/dL (ref <117)

## 2012-06-07 MED ORDER — RISPERIDONE 2 MG PO TABS: 2.0000 mg | ORAL_TABLET | Freq: Every day | ORAL | Status: DC

## 2012-06-07 MED ORDER — AMITRIPTYLINE HCL 50 MG PO TABS: 50.0000 mg | ORAL_TABLET | Freq: Every day | ORAL | Status: DC

## 2012-06-07 MED ORDER — LITHIUM CARBONATE 300 MG PO CAPS: ORAL_CAPSULE | ORAL | Status: DC

## 2012-06-07 NOTE — Progress Notes (Signed)
[facility] [id number] Progress Note  ELLYCE LAFEVERS [id number] 62 y.o.  06/07/2012 10:30 AM  Chief Complaint: I am thinking to retire.  I like my medication.  History of Present Illness: Patient is 62 year old Caucasian female who came for her followup appointment.  She is compliant with her medication and denies any side effects.  She is tending to get her apartment in July.  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  She is also in the process of buying a house.  She is very excited and happy about it.  She is hoping to have closing in few weeks.  She sleeps better.  She denies any agitation anger or mood swings.  There were no tremors or shakes.  She is not drinking or using any illegal substance.  She's not taking ibuprofen since she was told that it may cause high creatinine as patient is taking lithium.  Patient denies any paranoia or any hallucination.  Suicidal Ideation: No Plan Formed: No Patient has means to carry out plan: No  Homicidal Ideation: No Plan Formed: No Patient has means to carry out plan: No  Review of Systems  Constitutional: Negative.   Respiratory: Negative.   Cardiovascular: Negative.   Gastrointestinal: Negative.   Musculoskeletal: Positive for back pain.  Neurological: Positive for headaches.   Psychiatric: Agitation: No Hallucination: No Depressed Mood: No Insomnia: No Hypersomnia: No Altered Concentration: No Feels Worthless: No Grandiose Ideas: No Belief In Special Powers: No New/Increased Substance Abuse: No Compulsions: No  Neurologic: Headache: Yes Seizure: No Paresthesias: No  Medical History: Patient has a history of bladder problems, obesity and chronic back pain.  Psychosocial history. She is separated since 2005.  She is a single and lives alone.  Outpatient Encounter Prescriptions as of 06/07/2012  Medication Sig Dispense Refill  . amitriptyline (ELAVIL) 50 MG tablet Take 1 tablet (50 mg total) by mouth at bedtime.  30  tablet  2  . influenza  inactive virus vaccine (FLUZONE/FLUARIX) injection Inject into the muscle once.        Marland Kitchen levothyroxine (SYNTHROID, LEVOTHROID) 88 MCG tablet Take 1 tablet (88 mcg total) by mouth daily.  30 tablet  6  . lithium carbonate 300 MG capsule TAKE 1 CAPSULE BY MOUTH EVERY MORNING AND TAKE 2 CAPSULES AT BEDTIME  90 capsule  2  . risperiDONE (RISPERDAL) 2 MG tablet Take 1 tablet (2 mg total) by mouth daily.  30 tablet  2  . [DISCONTINUED] amitriptyline (ELAVIL) 50 MG tablet Take 1 tablet (50 mg total) by mouth at bedtime.  30 tablet  2  . [DISCONTINUED] lithium carbonate 300 MG capsule TAKE 1 CAPSULE BY MOUTH EVERY MORNING AND TAKE 2 CAPSULES AT BEDTIME  90 capsule  0  . [DISCONTINUED] risperiDONE (RISPERDAL) 2 MG tablet Take 1 tablet (2 mg total) by mouth daily.  30 tablet  2  . ibuprofen (ADVIL,MOTRIN) 200 MG tablet Take 400-800 mg by mouth every 6 (six) hours as needed. For pain       . PRESCRIPTION MEDICATION Apply topically as needed. Psoriasis         No facility-administered encounter medications on file as of 06/07/2012.    Past Psychiatric History/Hospitalization(s): Anxiety: No Bipolar Disorder: Yes Depression: Yes Mania: Yes Psychosis: No Schizophrenia: No Personality Disorder: No Hospitalization for psychiatric illness: Yes History of Electroconvulsive Shock Therapy: No Prior Suicide Attempts: No  Physical Exam: Constitutional:  BP 120/80  Pulse 88  Wt 207 lb (93.895 kg)  BMI 33.91  kg/m2  General Appearance: alert, oriented, no acute distress, well nourished and obese  Musculoskeletal: Strength & Muscle Tone: within normal limits Gait & Station: normal Patient leans: N/A  Psychiatric: Speech (describe rate, volume, coherence, spontaneity, and abnormalities if any): Fast but clear and coherent  Thought Process (describe rate, content, abstract reasoning, and computation): Logical and goal directed.  Associations: Coherent, Relevant and  Intact  Thoughts: normal  Mental Status: Orientation: oriented to person, place, time/date and situation Mood & Affect: hypomanic and Labile Attention Span & Concentration: Fair  Medical Decision Making (Choose Three): Established Problem, Stable/Improving (1), Review of Psycho-Social Stressors (1), Review or order clinical lab tests (1), Review of Last Therapy Session (1) and Review of Medication Regimen & Side Effects (2)  Assessment: Axis I: Bipolar disorder  Axis II: Deferred  Axis III: Obesity and hypothyroidism   Axis IV: Mild  Axis V: 65-70   Plan: Review of psychosocial stressors, current medication and response to current medication.  She is doing better on lithium and Gestalt.  She takes amitriptyline at bedtime.  At this time she does not have any side effects including any tremors or shakes.  Discussed weight gain issues and recommend to watch her calorie intake and monitor her diet .  I would also do blood work which has not done in past 7 months.  We will do CBC, CMP, TSH and hemoglobin A1c.  We would also do a lithium level.  Recommend to call us back if she has any questions or concerns in the dorsum of the symptom.  I will see him again in 3 months.  Time spent 25 minutes.  More than 50% of the time spent his identification costing incorporation of care.    Merek Niu T., MD 06/07/2012

## 2012-06-08 LAB — TSH: TSH: 7.333 u[IU]/mL — ABNORMAL HIGH (ref 0.350–4.500)

## 2012-06-09 ENCOUNTER — Telehealth (HOSPITAL_COMMUNITY): Payer: Self-pay | Admitting: Psychiatry

## 2012-06-09 NOTE — Telephone Encounter (Signed)
Left message about her blood work.  Recommend to call us back.

## 2012-06-28 ENCOUNTER — Ambulatory Visit: Payer: Self-pay | Admitting: Internal Medicine

## 2012-07-12 ENCOUNTER — Encounter: Payer: Self-pay | Admitting: Family Medicine

## 2012-07-12 ENCOUNTER — Ambulatory Visit (INDEPENDENT_AMBULATORY_CARE_PROVIDER_SITE_OTHER): Payer: 59 | Admitting: Family Medicine

## 2012-07-12 VITALS — BP 118/82 | HR 95 | Temp 97.9°F | Ht 67.0 in | Wt 205.1 lb

## 2012-07-12 DIAGNOSIS — R5381 Other malaise: Secondary | ICD-10-CM

## 2012-07-12 DIAGNOSIS — L989 Disorder of the skin and subcutaneous tissue, unspecified: Secondary | ICD-10-CM

## 2012-07-12 DIAGNOSIS — F319 Bipolar disorder, unspecified: Secondary | ICD-10-CM

## 2012-07-12 DIAGNOSIS — E039 Hypothyroidism, unspecified: Secondary | ICD-10-CM

## 2012-07-12 DIAGNOSIS — E785 Hyperlipidemia, unspecified: Secondary | ICD-10-CM

## 2012-07-12 LAB — CBC
MCH: 30.1 pg (ref 26.0–34.0)
MCHC: 33.4 g/dL (ref 30.0–36.0)
Platelets: 291 10*3/uL (ref 150–400)

## 2012-07-12 LAB — HEPATIC FUNCTION PANEL
ALT: 22 U/L (ref 0–35)
AST: 18 U/L (ref 0–37)
Alkaline Phosphatase: 81 U/L (ref 39–117)
Indirect Bilirubin: 0.3 mg/dL (ref 0.0–0.9)
Total Protein: 6.9 g/dL (ref 6.0–8.3)

## 2012-07-12 LAB — LIPID PANEL
HDL: 41 mg/dL (ref >39)
LDL Cholesterol: 83 mg/dL (ref 0–99)
Triglycerides: 201 mg/dL — ABNORMAL HIGH (ref <150)

## 2012-07-12 LAB — RENAL FUNCTION PANEL
BUN: 8 mg/dL (ref 6–23)
Chloride: 106 mEq/L (ref 96–112)
Glucose, Bld: 95 mg/dL (ref 70–99)
Potassium: 4.1 mEq/L (ref 3.5–5.3)

## 2012-07-12 NOTE — Patient Instructions (Addendum)
DASH Diet  The DASH diet stands for "Dietary Approaches to Stop Hypertension." It is a healthy eating plan that has been shown to reduce high blood pressure (hypertension) in as little as 14 days, while also possibly providing other significant health benefits. These other health benefits include reducing the risk of breast cancer after menopause and reducing the risk of type 2 diabetes, heart disease, colon cancer, and stroke. Health benefits also include weight loss and slowing kidney failure in patients with chronic kidney disease.   DIET GUIDELINES  · Limit salt (sodium). Your diet should contain less than 1500 mg of sodium daily.  · Limit refined or processed carbohydrates. Your diet should include mostly whole grains. Desserts and added sugars should be used sparingly.  · Include small amounts of heart-healthy fats. These types of fats include nuts, oils, and tub margarine. Limit saturated and trans fats. These fats have been shown to be harmful in the body.  CHOOSING FOODS   The following food groups are based on a 2000 calorie diet. See your Registered Dietitian for individual calorie needs.  Grains and Grain Products (6 to 8 servings daily)  · Eat More Often: Whole-wheat bread, brown rice, whole-grain or wheat pasta, quinoa, popcorn without added fat or salt (air popped).  · Eat Less Often: White bread, white pasta, white rice, cornbread.  Vegetables (4 to 5 servings daily)  · Eat More Often: Fresh, frozen, and canned vegetables. Vegetables may be raw, steamed, roasted, or grilled with a minimal amount of fat.  · Eat Less Often/Avoid: Creamed or fried vegetables. Vegetables in a cheese sauce.  Fruit (4 to 5 servings daily)  · Eat More Often: All fresh, canned (in natural juice), or frozen fruits. Dried fruits without added sugar. One hundred percent fruit juice (½ cup [237 mL] daily).  · Eat Less Often: Dried fruits with added sugar. Canned fruit in light or heavy syrup.  Lean Meats, Fish, and Poultry (2  servings or less daily. One serving is 3 to 4 oz [85-114 g]).  · Eat More Often: Ninety percent or leaner ground beef, tenderloin, sirloin. Round cuts of beef, chicken breast, turkey breast. All fish. Grill, bake, or broil your meat. Nothing should be fried.  · Eat Less Often/Avoid: Fatty cuts of meat, turkey, or chicken leg, thigh, or wing. Fried cuts of meat or fish.  Dairy (2 to 3 servings)  · Eat More Often: Low-fat or fat-free milk, low-fat plain or light yogurt, reduced-fat or part-skim cheese.  · Eat Less Often/Avoid: Milk (whole, 2%). Whole milk yogurt. Full-fat cheeses.  Nuts, Seeds, and Legumes (4 to 5 servings per week)  · Eat More Often: All without added salt.  · Eat Less Often/Avoid: Salted nuts and seeds, canned beans with added salt.  Fats and Sweets (limited)  · Eat More Often: Vegetable oils, tub margarines without trans fats, sugar-free gelatin. Mayonnaise and salad dressings.  · Eat Less Often/Avoid: Coconut oils, palm oils, butter, stick margarine, cream, half and half, cookies, candy, pie.  FOR MORE INFORMATION  The Dash Diet Eating Plan: www.dashdiet.org  Document Released: 01/16/2011 Document Revised: 04/21/2011 Document Reviewed: 01/16/2011  Exit[employer]® Patient Information ©2014 [employer].

## 2012-07-13 ENCOUNTER — Encounter: Payer: Self-pay | Admitting: Family Medicine

## 2012-07-13 NOTE — Progress Notes (Signed)
Patient ID: TYANNE DEROCHER, female   DOB: [birth year], 62 y.o.   MRN: [id number] TRINNITY BREUNIG [id number] [birth year] 07/13/2012      Progress Note-Follow Up  Subjective  Chief Complaint  Chief Complaint  Patient presents with  . Follow-up    HPI  Patient is a 62 year old female who is in generally doing well but does have a few complaints. One is she notes persistent fatigue. Just complains of some dry mouth. She denies any recent illness. No headache, fevers, chest pain, palpitations, shortness of breath, GI or GU concerns. Is following with psychiatry for her bipolar disorder reports she's doing well  Past Medical History  Diagnosis Date  . Bipolar disorder   . PTSD (post-traumatic stress disorder)   . Thyroid disease   . Hypoglycemia     Past Surgical History  Procedure Laterality Date  . Tubal ligation      reversal    Family History  Problem Relation Age of Onset  . Breast cancer Neg Hx   . Colon cancer Neg Hx   . Prostate cancer Father   . Diabetes Mother     Brother 1 of 2  . Heart failure Mother   . Hypertension Brother   . Esophageal cancer Maternal Grandmother   . Cancer Paternal Grandmother     cancer of jawbone 1 of 2  . Melanoma Brother   . Parkinsonism Father     deceased    History   Social History  . Marital Status: Divorced    Spouse Name: N/A    Number of Children: N/A  . Years of Education: N/A   Occupational History  . Not on file.   Social History Main Topics  . Smoking status: Former Games developer  . Smokeless tobacco: Never Used  . Alcohol Use: No  . Drug Use: No  . Sexually Active: Not on file   Other Topics Concern  . Not on file   Social History Narrative  . No narrative on file    Current Outpatient Prescriptions on File Prior to Visit  Medication Sig Dispense Refill  . amitriptyline (ELAVIL) 50 MG tablet Take 1 tablet (50 mg total) by mouth at bedtime.  30 tablet  2  . ibuprofen (ADVIL,MOTRIN) 200 MG tablet Take 400-800 mg by mouth  every 6 (six) hours as needed. For pain       . influenza  inactive virus vaccine (FLUZONE/FLUARIX) injection Inject into the muscle once.        Marland Kitchen levothyroxine (SYNTHROID, LEVOTHROID) 88 MCG tablet Take 1 tablet (88 mcg total) by mouth daily.  30 tablet  6  . lithium carbonate 300 MG capsule TAKE 1 CAPSULE BY MOUTH EVERY MORNING AND TAKE 2 CAPSULES AT BEDTIME  90 capsule  2  . PRESCRIPTION MEDICATION Apply topically as needed. Psoriasis        . risperiDONE (RISPERDAL) 2 MG tablet Take 1 tablet (2 mg total) by mouth daily.  30 tablet  2   No current facility-administered medications on file prior to visit.    Allergies  Allergen Reactions  . Codeine Nausea Only  . Darvon     Hallucinations   . Sulfa Drugs Cross Reactors Nausea And Vomiting    Review of Systems  Review of Systems  Constitutional: Positive for malaise/fatigue. Negative for fever.  HENT: Negative for congestion.   Eyes: Negative for discharge.  Respiratory: Negative for shortness of breath.   Cardiovascular: Negative for chest pain, palpitations and leg swelling.  Gastrointestinal: Negative for nausea, abdominal pain and diarrhea.  Genitourinary: Negative for dysuria.  Musculoskeletal: Negative for falls.  Skin: Negative for rash.  Neurological: Negative for loss of consciousness and headaches.  Endo/Heme/Allergies: Negative for polydipsia.  Psychiatric/Behavioral: Negative for depression and suicidal ideas. The patient is not nervous/anxious and does not have insomnia.     Objective  BP 118/82  Pulse 95  Temp(Src) 97.9 F (36.6 C) (Oral)  Ht 5\' 7"  (1.702 m)  Wt 205 lb 1.9 oz (93.042 kg)  BMI 32.12 kg/m2  SpO2 97%  Physical Exam  Physical Exam  Constitutional: She is oriented to person, place, and time and well-developed, well-nourished, and in no distress. No distress.  HENT:  Head: Normocephalic and atraumatic.  Eyes: Conjunctivae are normal.  Neck: Neck supple. No thyromegaly present.   Cardiovascular: Normal rate, regular rhythm and normal heart sounds.   No murmur heard. Pulmonary/Chest: Effort normal and breath sounds normal. She has no wheezes.  Abdominal: She exhibits no distension and no mass.  Musculoskeletal: She exhibits no edema.  Lymphadenopathy:    She has no cervical adenopathy.  Neurological: She is alert and oriented to person, place, and time.  Skin: Skin is warm and dry. No rash noted. She is not diaphoretic.  Psychiatric: Memory, affect and judgment normal.    Lab Results  Component Value Date   TSH 7.735* 07/12/2012   Lab Results  Component Value Date   WBC 10.3 07/12/2012   HGB 13.3 07/12/2012   HCT 39.8 07/12/2012   MCV 90.0 07/12/2012   PLT 291 07/12/2012   Lab Results  Component Value Date   CREATININE 1.10 07/12/2012   BUN 8 07/12/2012   NA 141 07/12/2012   K 4.1 07/12/2012   CL 106 07/12/2012   CO2 26 07/12/2012   Lab Results  Component Value Date   ALT 22 07/12/2012   AST 18 07/12/2012   ALKPHOS 81 07/12/2012   BILITOT 0.4 07/12/2012   Lab Results  Component Value Date   CHOL 164 07/12/2012   Lab Results  Component Value Date   HDL 41 07/12/2012   Lab Results  Component Value Date   LDLCALC 83 07/12/2012   Lab Results  Component Value Date   TRIG 201* 07/12/2012   Lab Results  Component Value Date   CHOLHDL 4.0 07/12/2012     Assessment & Plan  Borderline hyperlipidemia Triglycerides some, minimize simple carbs and add krill oil caps  Bipolar disorder Follows with psychiatry and is stable on current meds  Hypothyroidism tsh still elevated will have her increase her Levothyroxine slightly. Will continue the 88 mcg dose but will add an extra dose on saturdays

## 2012-07-13 NOTE — Assessment & Plan Note (Signed)
tsh still elevated will have her increase her Levothyroxine slightly. Will continue the 88 mcg dose but will add an extra dose on saturdays

## 2012-07-13 NOTE — Assessment & Plan Note (Signed)
Follows with psychiatry and is stable on current meds

## 2012-07-13 NOTE — Assessment & Plan Note (Signed)
Triglycerides some, minimize simple carbs and add krill oil caps

## 2012-07-14 ENCOUNTER — Telehealth: Payer: Self-pay

## 2012-07-14 MED ORDER — LEVOTHYROXINE SODIUM 88 MCG PO TABS: ORAL_TABLET | ORAL | Status: DC

## 2012-07-14 MED ORDER — LEVOTHYROXINE SODIUM 100 MCG PO TABS: 100.0000 ug | ORAL_TABLET | Freq: Every day | ORAL | Status: DC

## 2012-07-14 NOTE — Telephone Encounter (Signed)
Interesting, they are both good options and I obviously would consider either option. Was she made aware of either option? If so stick with that one, if not use the 88 mcg dose

## 2012-07-14 NOTE — Telephone Encounter (Signed)
Left a message for pt to return my call  RX sent

## 2012-07-14 NOTE — Telephone Encounter (Signed)
Message copied by Court Joy on Wed Jul 14, 2012  4:29 PM ------      Message from: Danise Edge A      Created: Tue Jul 13, 2012  9:37 PM       Her tsh is still up have her increase her Levothyroxine to 88 mcg tabs 1 tab po daily except Saturday take 2 tabs  ------

## 2012-07-14 NOTE — Telephone Encounter (Signed)
There were 2 different messages sent to me about Levothyroxine? One was for 88 mcg take 1 daily and 2 tabs on Saturday and one was 100 mcg daily.  Please advise?

## 2012-07-14 NOTE — Addendum Note (Signed)
Addended by: Court Joy on: 07/14/2012 10:06 AM   Modules accepted: Orders

## 2012-07-15 NOTE — Telephone Encounter (Signed)
Spoke to pharmacy and Margaret [name] states that pt was came by and got the 100 mcg because she stated MD told her to take that one.  i will update med list

## 2012-09-06 ENCOUNTER — Ambulatory Visit (HOSPITAL_COMMUNITY): Payer: Self-pay | Admitting: Psychiatry

## 2012-09-10 ENCOUNTER — Other Ambulatory Visit: Payer: Self-pay | Admitting: Family Medicine

## 2012-09-16 ENCOUNTER — Encounter: Payer: Self-pay | Admitting: Family Medicine

## 2012-09-16 ENCOUNTER — Ambulatory Visit (INDEPENDENT_AMBULATORY_CARE_PROVIDER_SITE_OTHER): Payer: 59 | Admitting: Family Medicine

## 2012-09-16 VITALS — BP 110/80 | HR 94 | Temp 98.7°F | Resp 16 | Ht 67.0 in | Wt 208.0 lb

## 2012-09-16 DIAGNOSIS — K219 Gastro-esophageal reflux disease without esophagitis: Secondary | ICD-10-CM

## 2012-09-16 DIAGNOSIS — R7309 Other abnormal glucose: Secondary | ICD-10-CM

## 2012-09-16 DIAGNOSIS — R739 Hyperglycemia, unspecified: Secondary | ICD-10-CM

## 2012-09-16 DIAGNOSIS — R609 Edema, unspecified: Secondary | ICD-10-CM

## 2012-09-16 DIAGNOSIS — R109 Unspecified abdominal pain: Secondary | ICD-10-CM

## 2012-09-16 DIAGNOSIS — E039 Hypothyroidism, unspecified: Secondary | ICD-10-CM

## 2012-09-16 DIAGNOSIS — R5383 Other fatigue: Secondary | ICD-10-CM

## 2012-09-16 DIAGNOSIS — R5381 Other malaise: Secondary | ICD-10-CM

## 2012-09-16 DIAGNOSIS — E785 Hyperlipidemia, unspecified: Secondary | ICD-10-CM

## 2012-09-16 DIAGNOSIS — R0602 Shortness of breath: Secondary | ICD-10-CM

## 2012-09-16 LAB — HEPATIC FUNCTION PANEL
ALT: 23 U/L (ref 0–35)
Albumin: 4.2 g/dL (ref 3.5–5.2)
Bilirubin, Direct: 0.1 mg/dL (ref 0.0–0.3)
Total Bilirubin: 0.4 mg/dL (ref 0.3–1.2)

## 2012-09-16 LAB — CBC
MCHC: 33.6 g/dL (ref 30.0–36.0)
RDW: 14.1 % (ref 11.5–15.5)

## 2012-09-16 LAB — TSH: TSH: 1.709 u[IU]/mL (ref 0.350–4.500)

## 2012-09-16 MED ORDER — FUROSEMIDE 20 MG PO TABS: 20.0000 mg | ORAL_TABLET | Freq: Every day | ORAL | Status: DC | PRN

## 2012-09-16 NOTE — Patient Instructions (Signed)
Probiotic daily such as Digestive Advantage or a generic Add a fiber supplement such as Metamucil/Psyllium once to twice daily or Benefiber powder  Compression hose, light weight 10-20 mmhg on in am off in pm, good company [employer]  Fatigue Fatigue is a feeling of tiredness, lack of energy, lack of motivation, or feeling tired all the time. Having enough rest, good nutrition, and reducing stress will normally reduce fatigue. Consult your caregiver if it persists. The nature of your fatigue will help your caregiver to find out its cause. The treatment is based on the cause.  CAUSES  There are many causes for fatigue. Most of the time, fatigue can be traced to one or more of your habits or routines. Most causes fit into one or more of three general areas. They are: Lifestyle problems  Sleep disturbances.  Overwork.  Physical exertion.  Unhealthy habits.  Poor eating habits or eating disorders.  Alcohol and/or drug use .  Lack of proper nutrition (malnutrition). Psychological problems  Stress and/or anxiety problems.  Depression.  Grief.  Boredom. Medical Problems or Conditions  Anemia.  Pregnancy.  Thyroid gland problems.  Recovery from major surgery.  Continuous pain.  Emphysema or asthma that is not well controlled  Allergic conditions.  Diabetes.  Infections (such as mononucleosis).  Obesity.  Sleep disorders, such as sleep apnea.  Heart failure or other heart-related problems.  Cancer.  Kidney disease.  Liver disease.  Effects of certain medicines such as antihistamines, cough and cold remedies, prescription pain medicines, heart and blood pressure medicines, drugs used for treatment of cancer, and some antidepressants. SYMPTOMS  The symptoms of fatigue include:   Lack of energy.  Lack of drive (motivation).  Drowsiness.  Feeling of indifference to the surroundings. DIAGNOSIS  The details of how you feel help guide your caregiver in finding  out what is causing the fatigue. You will be asked about your present and past health condition. It is important to review all medicines that you take, including prescription and non-prescription items. A thorough exam will be done. You will be questioned about your feelings, habits, and normal lifestyle. Your caregiver may suggest blood tests, urine tests, or other tests to look for common medical causes of fatigue.  TREATMENT  Fatigue is treated by correcting the underlying cause. For example, if you have continuous pain or depression, treating these causes will improve how you feel. Similarly, adjusting the dose of certain medicines will help in reducing fatigue.  HOME CARE INSTRUCTIONS   Try to get the required amount of good sleep every night.  Eat a healthy and nutritious diet, and drink enough water throughout the day.  Practice ways of relaxing (including yoga or meditation).  Exercise regularly.  Make plans to change situations that cause stress. Act on those plans so that stresses decrease over time. Keep your work and personal routine reasonable.  Avoid street drugs and minimize use of alcohol.  Start taking a daily multivitamin after consulting your caregiver. SEEK MEDICAL CARE IF:   You have persistent tiredness, which cannot be accounted for.  You have fever.  You have unintentional weight loss.  You have headaches.  You have disturbed sleep throughout the night.  You are feeling sad.  You have constipation.  You have dry skin.  You have gained weight.  You are taking any new or different medicines that you suspect are causing fatigue.  You are unable to sleep at night.  You develop any unusual swelling of your legs or  other parts of your body. SEEK IMMEDIATE MEDICAL CARE IF:   You are feeling confused.  Your vision is blurred.  You feel faint or pass out.  You develop severe headache.  You develop severe abdominal, pelvic, or back pain.  You  develop chest pain, shortness of breath, or an irregular or fast heartbeat.  You are unable to pass a normal amount of urine.  You develop abnormal bleeding such as bleeding from the rectum or you vomit blood.  You have thoughts about harming yourself or committing suicide.  You are worried that you might harm someone else. MAKE SURE YOU:   Understand these instructions.  Will watch your condition.  Will get help right away if you are not doing well or get worse. Document Released: 11/24/2006 Document Revised: 04/21/2011 Document Reviewed: 11/24/2006 [facility] Patient Information 2014 [place], [place].

## 2012-09-16 NOTE — Progress Notes (Signed)
Patient ID: Margaret [name], female   DOB: December 15, 1950, 62 y.o.   MRN: [id number] HELGA ASBURY [id number] 1950-06-17 09/16/2012      Progress Note-Follow Up  Subjective  Chief Complaint  Chief Complaint  Patient presents with  . Fatigue    Pt reports fatigue, weakness, shortness of breath and bilateral foot and leg pain and swelling worsening x 1 month.    HPI  Patient is a 62 year old Caucasian female who is in today complaining of fatigue. She says it has been slowly worsening over several weeks. She is also complaining of diarrhea roughly 2 loose stool daily. No fevers or chills. No chest pain or palpitations. She's having of the heart. Some dyspepsia and bloating noted. Complains of swelling and pain in her joints as well. No fevers or chills. No headache or neurologic complaints  Past Medical History  Diagnosis Date  . Bipolar disorder   . PTSD (post-traumatic stress disorder)   . Thyroid disease   . Hypoglycemia     Past Surgical History  Procedure Laterality Date  . Tubal ligation      reversal    Family History  Problem Relation Age of Onset  . Breast cancer Neg Hx   . Colon cancer Neg Hx   . Prostate cancer Father   . Diabetes Mother     Brother 1 of 2  . Heart failure Mother   . Hypertension Brother   . Esophageal cancer Maternal Grandmother   . Cancer Paternal Grandmother     cancer of jawbone 1 of 2  . Melanoma Brother   . Parkinsonism Father     deceased    History   Social History  . Marital Status: Divorced    Spouse Name: N/A    Number of Children: N/A  . Years of Education: N/A   Occupational History  . Not on file.   Social History Main Topics  . Smoking status: Former Games developer  . Smokeless tobacco: Never Used  . Alcohol Use: No  . Drug Use: No  . Sexually Active: Not on file   Other Topics Concern  . Not on file   Social History Narrative  . No narrative on file    Current Outpatient Prescriptions on File Prior to Visit  Medication Sig  Dispense Refill  . amitriptyline (ELAVIL) 50 MG tablet Take 1 tablet (50 mg total) by mouth at bedtime.  30 tablet  2  . ibuprofen (ADVIL,MOTRIN) 200 MG tablet Take 400-800 mg by mouth every 6 (six) hours as needed. For pain       . levothyroxine (SYNTHROID, LEVOTHROID) 100 MCG tablet TAKE 1 TABLET (100 MCG TOTAL) BY MOUTH DAILY BEFORE BREAKFAST.  30 tablet  5  . lithium carbonate 300 MG capsule TAKE 1 CAPSULE BY MOUTH EVERY MORNING AND TAKE 2 CAPSULES AT BEDTIME  90 capsule  2  . PRESCRIPTION MEDICATION Apply topically as needed. Psoriasis        . risperiDONE (RISPERDAL) 2 MG tablet Take 1 tablet (2 mg total) by mouth daily.  30 tablet  2   No current facility-administered medications on file prior to visit.    Allergies  Allergen Reactions  . Codeine Nausea Only  . Darvon     Hallucinations   . Sulfa Drugs Cross Reactors Nausea And Vomiting    Review of Systems  Review of Systems  Constitutional: Positive for malaise/fatigue. Negative for fever.  HENT: Negative for congestion.   Eyes: Negative for pain and discharge.  Respiratory: Negative for shortness of breath.   Cardiovascular: Negative for chest pain, palpitations and leg swelling.  Gastrointestinal: Positive for heartburn and abdominal pain. Negative for nausea and diarrhea.  Genitourinary: Negative for dysuria.  Musculoskeletal: Negative for falls.  Skin: Negative for rash.  Neurological: Negative for loss of consciousness and headaches.  Endo/Heme/Allergies: Negative for polydipsia.  Psychiatric/Behavioral: Negative for depression and suicidal ideas. The patient is not nervous/anxious and does not have insomnia.     Objective  BP 110/80  Pulse 94  Temp(Src) 98.7 F (37.1 C) (Oral)  Resp 16  Ht 5\' 7"  (1.702 m)  Wt 208 lb 0.6 oz (94.366 kg)  BMI 32.58 kg/m2  SpO2 97%  Physical Exam  Physical Exam  Constitutional: She is oriented to person, place, and time and well-developed, well-nourished, and in no  distress. No distress.  HENT:  Head: Normocephalic and atraumatic.  Eyes: Conjunctivae are normal.  Neck: Neck supple. No thyromegaly present.  Cardiovascular: Normal rate and regular rhythm.  Exam reveals no gallop.   No murmur heard. Pulmonary/Chest: Effort normal and breath sounds normal. She has no wheezes.  Abdominal: She exhibits no distension and no mass.  Musculoskeletal: She exhibits no edema.  Lymphadenopathy:    She has no cervical adenopathy.  Neurological: She is alert and oriented to person, place, and time.  Skin: Skin is warm and dry. No rash noted. She is not diaphoretic.  Psychiatric: Memory, affect and judgment normal.    Lab Results  Component Value Date   TSH 7.735* 07/12/2012   Lab Results  Component Value Date   WBC 10.3 07/12/2012   HGB 13.3 07/12/2012   HCT 39.8 07/12/2012   MCV 90.0 07/12/2012   PLT 291 07/12/2012   Lab Results  Component Value Date   CREATININE 1.10 07/12/2012   BUN 8 07/12/2012   NA 141 07/12/2012   K 4.1 07/12/2012   CL 106 07/12/2012   CO2 26 07/12/2012   Lab Results  Component Value Date   ALT 22 07/12/2012   AST 18 07/12/2012   ALKPHOS 81 07/12/2012   BILITOT 0.4 07/12/2012   Lab Results  Component Value Date   CHOL 164 07/12/2012   Lab Results  Component Value Date   HDL 41 07/12/2012   Lab Results  Component Value Date   LDLCALC 83 07/12/2012   Lab Results  Component Value Date   TRIG 201* 07/12/2012   Lab Results  Component Value Date   CHOLHDL 4.0 07/12/2012     Assessment & Plan   Hyperglycemia hgba1c 5.8, minimize simple carbs.   Hypothyroidism Thyroid studies wnl, continue current dose of Levothyroxine.   Borderline hyperlipidemia Mild elevation of triglycerides, minimize simple carbs. Add krill oil caps daily  Esophageal reflux H pylori negative, avoid offending foods, add a probiotic, may use Tums prn, add Rantidine if symptoms persist.   Other malaise and fatigue Multifactorial, encouraged 8 hours sleep, regular  exercise, small, frequent meals, return if symptoms worsen.

## 2012-09-17 LAB — HEMOGLOBIN A1C: Hgb A1c MFr Bld: 5.8 % — ABNORMAL HIGH (ref <5.7)

## 2012-09-19 ENCOUNTER — Encounter: Payer: Self-pay | Admitting: Family Medicine

## 2012-09-19 DIAGNOSIS — R739 Hyperglycemia, unspecified: Secondary | ICD-10-CM | POA: Insufficient documentation

## 2012-09-19 DIAGNOSIS — K219 Gastro-esophageal reflux disease without esophagitis: Secondary | ICD-10-CM

## 2012-09-19 DIAGNOSIS — R5381 Other malaise: Secondary | ICD-10-CM | POA: Insufficient documentation

## 2012-09-19 HISTORY — DX: Gastro-esophageal reflux disease without esophagitis: K21.9

## 2012-09-19 HISTORY — DX: Other malaise: R53.81

## 2012-09-19 NOTE — Assessment & Plan Note (Signed)
Thyroid studies wnl, continue current dose of Levothyroxine.

## 2012-09-19 NOTE — Assessment & Plan Note (Signed)
Multifactorial, encouraged 8 hours sleep, regular exercise, small, frequent meals, return if symptoms worsen.

## 2012-09-19 NOTE — Assessment & Plan Note (Signed)
Mild elevation of triglycerides, minimize simple carbs. Add krill oil caps daily

## 2012-09-19 NOTE — Assessment & Plan Note (Signed)
hgba1c 5.8, minimize simple carbs.

## 2012-09-19 NOTE — Assessment & Plan Note (Signed)
H pylori negative, avoid offending foods, add a probiotic, may use Tums prn, add Rantidine if symptoms persist.

## 2012-09-22 MED ORDER — RANITIDINE HCL 300 MG PO TABS: 300.0000 mg | ORAL_TABLET | Freq: Every day | ORAL | Status: DC

## 2012-09-22 NOTE — Addendum Note (Signed)
Addended by: Court Joy on: 09/22/2012 12:59 PM   Modules accepted: Orders

## 2012-09-23 ENCOUNTER — Telehealth: Payer: Self-pay

## 2012-09-23 DIAGNOSIS — D72829 Elevated white blood cell count, unspecified: Secondary | ICD-10-CM

## 2012-09-23 NOTE — Telephone Encounter (Signed)
Pt informed of MD's instructions and lab order placed

## 2012-09-27 ENCOUNTER — Other Ambulatory Visit (HOSPITAL_COMMUNITY): Payer: Self-pay | Admitting: Psychiatry

## 2012-09-27 DIAGNOSIS — F3189 Other bipolar disorder: Secondary | ICD-10-CM

## 2012-10-06 ENCOUNTER — Ambulatory Visit (HOSPITAL_BASED_OUTPATIENT_CLINIC_OR_DEPARTMENT_OTHER): Payer: Self-pay

## 2012-10-18 ENCOUNTER — Ambulatory Visit (INDEPENDENT_AMBULATORY_CARE_PROVIDER_SITE_OTHER): Payer: 59 | Admitting: Psychiatry

## 2012-10-18 ENCOUNTER — Encounter (HOSPITAL_COMMUNITY): Payer: Self-pay | Admitting: Psychiatry

## 2012-10-18 VITALS — BP 130/72 | HR 89 | Ht 67.0 in | Wt 213.0 lb

## 2012-10-18 DIAGNOSIS — F3189 Other bipolar disorder: Secondary | ICD-10-CM

## 2012-10-18 DIAGNOSIS — F319 Bipolar disorder, unspecified: Secondary | ICD-10-CM

## 2012-10-18 MED ORDER — LITHIUM CARBONATE 300 MG PO CAPS: ORAL_CAPSULE | ORAL | Status: DC

## 2012-10-18 MED ORDER — RISPERIDONE 2 MG PO TABS: ORAL_TABLET | ORAL | Status: DC

## 2012-10-18 MED ORDER — AMITRIPTYLINE HCL 50 MG PO TABS: ORAL_TABLET | ORAL | Status: DC

## 2012-10-18 NOTE — Progress Notes (Signed)
[facility] [id number] Progress Note  Margaret [name] [id number] 62 y.o.  10/18/2012 11:39 AM  Chief Complaint: My thyroid medication is suggested .  I'm doing better.    History of Present Illness: Patient is 62 year old Caucasian female who came for her followup appointment.  Patient was seen by her endocrinologist recently and her Synthroid was adjusted.  She is feeling much better.  She recently moved to a new home.  She likes it.  She cut down her work and working only 20 hours a week.  She is thinking to have a foster child for extra income.  Overall she is sleeping better.  She denies any irritability anger or mood swings.  She denies any crying spells.  She has not been using any illegal substance.  She has a blood work which was done 4 weeks ago which shows TSH 1.709, WBC count 11.5 and hemoglobin A1c is 5.8.  Her lipid panel shows high triglyceride.  She does not take ibuprofen unless she needed.  Her lithium level is 0.90.  Patient denies any tremors shakes or any paranoia.  Her creatinine is also better from the past.  Suicidal Ideation: No Plan Formed: No Patient has means to carry out plan: No  Homicidal Ideation: No Plan Formed: No Patient has means to carry out plan: No  Review of Systems  Constitutional: Negative.   Respiratory: Negative.   Cardiovascular: Negative.   Gastrointestinal: Negative.   Musculoskeletal: Positive for back pain.  Neurological: Positive for headaches.   Psychiatric: Agitation: No Hallucination: No Depressed Mood: No Insomnia: No Hypersomnia: No Altered Concentration: No Feels Worthless: No Grandiose Ideas: No Belief In Special Powers: No New/Increased Substance Abuse: No Compulsions: No  Neurologic: Headache: Yes Seizure: No Paresthesias: No  Medical History: Patient has a history of bladder problems, obesity and chronic back pain.  Psychosocial history. She is separated since 2005.  She is a single and lives  alone.  Outpatient Encounter Prescriptions as of 10/18/2012  Medication Sig Dispense Refill  . amitriptyline (ELAVIL) 50 MG tablet TAKE 1 TABLET (50 MG TOTAL) BY MOUTH AT BEDTIME.  30 tablet  2  . furosemide (LASIX) 20 MG tablet Take 1 tablet (20 mg total) by mouth daily as needed for fluid or edema.  30 tablet  1  . ibuprofen (ADVIL,MOTRIN) 200 MG tablet Take 400-800 mg by mouth every 6 (six) hours as needed. For pain       . levothyroxine (SYNTHROID, LEVOTHROID) 100 MCG tablet TAKE 1 TABLET (100 MCG TOTAL) BY MOUTH DAILY BEFORE BREAKFAST.  30 tablet  5  . lithium carbonate 300 MG capsule TAKE 1 CAPSULE BY MOUTH EVERY MORNING AND TAKE 2 CAPSULES AT BEDTIME  90 capsule  2  . PRESCRIPTION MEDICATION Apply topically as needed. Psoriasis        . ranitidine (ZANTAC) 300 MG tablet Take 1 tablet (300 mg total) by mouth at bedtime.  30 tablet  1  . risperiDONE (RISPERDAL) 2 MG tablet TAKE 1 TABLET (2 MG TOTAL) BY MOUTH DAILY.  30 tablet  2  . [DISCONTINUED] amitriptyline (ELAVIL) 50 MG tablet TAKE 1 TABLET (50 MG TOTAL) BY MOUTH AT BEDTIME.  30 tablet  0  . [DISCONTINUED] lithium carbonate 300 MG capsule TAKE 1 CAPSULE BY MOUTH EVERY MORNING AND TAKE 2 CAPSULES AT BEDTIME  90 capsule  0  . [DISCONTINUED] risperiDONE (RISPERDAL) 2 MG tablet TAKE 1 TABLET (2 MG TOTAL) BY MOUTH DAILY.  30 tablet  0   No  facility-administered encounter medications on file as of 10/18/2012.   Recent Results (from the past 2160 hour(s))  TSH     Status: None   Collection Time    09/16/12  4:08 PM      Result Value Range   TSH 1.709  0.350 - 4.500 uIU/mL  CBC     Status: Abnormal   Collection Time    09/16/12  4:08 PM      Result Value Range   WBC 11.5 (*) 4.0 - 10.5 K/uL   RBC 4.32  3.87 - 5.11 MIL/uL   Hemoglobin 13.1  12.0 - 15.0 g/dL   HCT 04.5  40.9 - 81.1 %   MCV 90.3  78.0 - 100.0 fL   MCH 30.3  26.0 - 34.0 pg   MCHC 33.6  30.0 - 36.0 g/dL   RDW 91.4  78.2 - 95.6 %   Platelets 328  150 - 400 K/uL  HEPATIC  FUNCTION PANEL     Status: None   Collection Time    09/16/12  4:08 PM      Result Value Range   Total Bilirubin 0.4  0.3 - 1.2 mg/dL   Bilirubin, Direct 0.1  0.0 - 0.3 mg/dL   Indirect Bilirubin 0.3  0.0 - 0.9 mg/dL   Alkaline Phosphatase 89  39 - 117 U/L   AST 18  0 - 37 U/L   ALT 23  0 - 35 U/L   Total Protein 6.7  6.0 - 8.3 g/dL   Albumin 4.2  3.5 - 5.2 g/dL  HEMOGLOBIN O1H     Status: Abnormal   Collection Time    09/16/12  4:08 PM      Result Value Range   Hemoglobin A1C 5.8 (*) <5.7 %   Comment:                                                                            According to the ADA Clinical Practice Recommendations for 2011, when     HbA1c is used as a screening test:             >=6.5%   Diagnostic of Diabetes Mellitus                (if abnormal result is confirmed)           5.7-6.4%   Increased risk of developing Diabetes Mellitus           References:Diagnosis and Classification of Diabetes Mellitus,Diabetes     Care,2011,34(Suppl 1):S62-S69 and Standards of Medical Care in             Diabetes - 2011,Diabetes Care,2011,34 (Suppl 1):S11-S61.         Mean Plasma Glucose 120 (*) <117 mg/dL  H. PYLORI ANTIBODY, IGG     Status: None   Collection Time    09/16/12  4:08 PM      Result Value Range   H Pylori IgG <0.40     Comment: No significant level of IgG antibody to H. pylori detected.              ISR = Immune Status Ratio                  <  0.90         ISR       Negative                  0.90 - 1.09   ISR       Equivocal                  >=1.10        ISR       Positive           The above results were obtained with the Immulite 2000 H. pylori IgG     EIA.  Results obtained from other manufacturer's assay methods may not     be used interchangeably.         Past Psychiatric History/Hospitalization(s): Anxiety: No Bipolar Disorder: Yes Depression: Yes Mania: Yes Psychosis: No Schizophrenia: No Personality Disorder: No Hospitalization for  psychiatric illness: Yes History of Electroconvulsive Shock Therapy: No Prior Suicide Attempts: No  Physical Exam: Constitutional:  BP 130/72  Pulse 89  Ht 5\' 7"  (1.702 m)  Wt 213 lb (96.616 kg)  BMI 33.35 kg/m2  General Appearance: alert, oriented, no acute distress, well nourished and obese  Musculoskeletal: Strength & Muscle Tone: within normal limits Gait & Station: normal Patient leans:  N/A  Psychiatric: Speech (describe rate, volume, coherence, spontaneity, and abnormalities if any): Fast but clear and coherent  Thought Process (describe rate, content, abstract reasoning, and computation): Logical and goal directed.  Associations: Coherent, Relevant and Intact  Thoughts: normal  Mental Status: Orientation: oriented to person, place, time/date and situation Mood & Affect: hypomanic and Labile Attention Span & Concentration: Fair  Medical Decision Making (Choose Three): Established Problem, Stable/Improving (1), Review of Psycho-Social Stressors (1), Review or order clinical lab tests (1), Review of Last Therapy Session (1) and Review of Medication Regimen & Side Effects (2)  Assessment: Axis I: Bipolar disorder  Axis II: Deferred  Axis III: Obesity and hypothyroidism   Axis IV: Mild  Axis V: 65-70   Plan:  I reviewed the results , and medication and psychosocial stressors.  Recommend continue her current psychiatric medication.  Patient like to get into foster care child , and discuss the pros and cons of extra work.  However patient enjoys 30 hours a week and wants to try his supplemental income.  Recommend to call us back on if she has any question or any concern.  Followup in 3 months.  Time spent 25 minutes.  More than 50% of the time spent his identification costing incorporation of care.    Ambur Province T., MD 10/18/2012

## 2012-10-19 ENCOUNTER — Encounter (HOSPITAL_COMMUNITY): Payer: Self-pay | Admitting: *Deleted

## 2012-10-22 ENCOUNTER — Telehealth (HOSPITAL_COMMUNITY): Payer: Self-pay

## 2012-10-22 NOTE — Telephone Encounter (Signed)
10/22/12 Patient came and pick-up letter for jury duty.Marland KitchenMarguerite Olea

## 2012-12-03 ENCOUNTER — Telehealth: Payer: Self-pay | Admitting: Family Medicine

## 2012-12-03 DIAGNOSIS — R109 Unspecified abdominal pain: Secondary | ICD-10-CM

## 2012-12-03 DIAGNOSIS — R197 Diarrhea, unspecified: Secondary | ICD-10-CM

## 2012-12-03 NOTE — Telephone Encounter (Signed)
FYI

## 2012-12-03 NOTE — Telephone Encounter (Signed)
Patient Information:  Caller Name: Sholonda  Phone: [phone]  Patient: Margaret [name], Margaret [name]  Gender: Female  DOB: May 25, 1950  Age: 62 Years  PCP: Danise Edge [facility])  Office Follow Up:  Does the office need to follow up with this patient?: No  Instructions For The Office: N/A  RN Note:  Pt requested an appt for Tuesday, 12/07/12. Transferred to scheduler per profile.  Symptoms  Reason For Call & Symptoms: Pt calling regarding SOB with ambulation, chronic fatigue and back pain. Pt was seen in August for same. Did not have echo that MD requested done. Now feels like sx are worsening and would like re-evaluation. Denies any current distress.  Reviewed Health History In EMR: Yes  Reviewed Medications In EMR: Yes  Reviewed Allergies In EMR: Yes  Reviewed Surgeries / Procedures: Yes  Date of Onset of Symptoms: 09/10/2012  Guideline(s) Used:  Weakness (Generalized) and Fatigue  Disposition Per Guideline:   See Within 3 Days in Office  Reason For Disposition Reached:   Mild weakness (i.e., does not interfere with ability to work, go to school, normal activities) and persists > 1 week  Advice Given:  N/A  Patient Will Follow Care Advice:  YES

## 2012-12-14 ENCOUNTER — Ambulatory Visit: Payer: Self-pay | Admitting: Family Medicine

## 2012-12-16 ENCOUNTER — Other Ambulatory Visit: Payer: Self-pay

## 2012-12-21 ENCOUNTER — Ambulatory Visit (INDEPENDENT_AMBULATORY_CARE_PROVIDER_SITE_OTHER): Payer: 59 | Admitting: Family Medicine

## 2012-12-21 ENCOUNTER — Encounter: Payer: Self-pay | Admitting: Family Medicine

## 2012-12-21 VITALS — BP 132/84 | HR 93 | Temp 98.2°F | Resp 16 | Ht 67.0 in | Wt 217.2 lb

## 2012-12-21 DIAGNOSIS — F319 Bipolar disorder, unspecified: Secondary | ICD-10-CM

## 2012-12-21 DIAGNOSIS — E039 Hypothyroidism, unspecified: Secondary | ICD-10-CM

## 2012-12-21 DIAGNOSIS — H6123 Impacted cerumen, bilateral: Secondary | ICD-10-CM

## 2012-12-21 DIAGNOSIS — N289 Disorder of kidney and ureter, unspecified: Secondary | ICD-10-CM

## 2012-12-21 DIAGNOSIS — R002 Palpitations: Secondary | ICD-10-CM

## 2012-12-21 DIAGNOSIS — R739 Hyperglycemia, unspecified: Secondary | ICD-10-CM

## 2012-12-21 DIAGNOSIS — R109 Unspecified abdominal pain: Secondary | ICD-10-CM

## 2012-12-21 DIAGNOSIS — E079 Disorder of thyroid, unspecified: Secondary | ICD-10-CM

## 2012-12-21 DIAGNOSIS — R7309 Other abnormal glucose: Secondary | ICD-10-CM

## 2012-12-21 DIAGNOSIS — H6120 Impacted cerumen, unspecified ear: Secondary | ICD-10-CM

## 2012-12-21 DIAGNOSIS — K219 Gastro-esophageal reflux disease without esophagitis: Secondary | ICD-10-CM

## 2012-12-21 DIAGNOSIS — R197 Diarrhea, unspecified: Secondary | ICD-10-CM

## 2012-12-21 MED ORDER — NEOMYCIN-POLYMYXIN-HC 1 % OT SOLN: 3.0000 [drp] | Freq: Every day | OTIC | Status: DC

## 2012-12-21 MED ORDER — RANITIDINE HCL 300 MG PO TABS: 300.0000 mg | ORAL_TABLET | Freq: Every day | ORAL | Status: DC

## 2012-12-21 NOTE — Patient Instructions (Addendum)
Probiotic such as Digestive Advantage  Benefiber powder three times   Diarrhea Diarrhea is frequent loose and watery bowel movements. It can cause you to feel weak and dehydrated. Dehydration can cause you to become tired and thirsty, have a dry mouth, and have decreased urination that often is dark yellow. Diarrhea is a sign of another problem, most often an infection that will not last long. In most cases, diarrhea typically lasts 2 3 days. However, it can last longer if it is a sign of something more serious. It is important to treat your diarrhea as directed by your caregive to lessen or prevent future episodes of diarrhea. CAUSES  Some common causes include:  Gastrointestinal infections caused by viruses, bacteria, or parasites.  Food poisoning or food allergies.  Certain medicines, such as antibiotics, chemotherapy, and laxatives.  Artificial sweeteners and fructose.  Digestive disorders. HOME CARE INSTRUCTIONS  Ensure adequate fluid intake (hydration): have 1 cup (8 oz) of fluid for each diarrhea episode. Avoid fluids that contain simple sugars or sports drinks, fruit juices, whole milk products, and sodas. Your urine should be clear or pale yellow if you are drinking enough fluids. Hydrate with an oral rehydration solution that you can purchase at pharmacies, retail stores, and online. You can prepare an oral rehydration solution at home by mixing the following ingredients together:    tsp table salt.   tsp baking soda.   tsp salt substitute containing potassium chloride.  1  tablespoons sugar.  1 L (34 oz) of water.  Certain foods and beverages may increase the speed at which food moves through the gastrointestinal (GI) tract. These foods and beverages should be avoided and include:  Caffeinated and alcoholic beverages.  High-fiber foods, such as raw fruits and vegetables, nuts, seeds, and whole grain breads and cereals.  Foods and beverages sweetened with sugar  alcohols, such as xylitol, sorbitol, and mannitol.  Some foods may be well tolerated and may help thicken stool including:  Starchy foods, such as rice, toast, pasta, low-sugar cereal, oatmeal, grits, baked potatoes, crackers, and bagels.  Bananas.  Applesauce.  Add probiotic-rich foods to help increase healthy bacteria in the GI tract, such as yogurt and fermented milk products.  Wash your hands well after each diarrhea episode.  Only take over-the-counter or prescription medicines as directed by your caregiver.  Take a warm bath to relieve any burning or pain from frequent diarrhea episodes. SEEK IMMEDIATE MEDICAL CARE IF:   You are unable to keep fluids down.  You have persistent vomiting.  You have blood in your stool, or your stools are black and tarry.  You do not urinate in 6 8 hours, or there is only a small amount of very dark urine.  You have abdominal pain that increases or localizes.  You have weakness, dizziness, confusion, or lightheadedness.  You have a severe headache.  Your diarrhea gets worse or does not get better.  You have a fever or persistent symptoms for more than 2 3 days.  You have a fever and your symptoms suddenly get worse. MAKE SURE YOU:   Understand these instructions.  Will watch your condition.  Will get help right away if you are not doing well or get worse. Document Released: 01/17/2002 Document Revised: 01/14/2012 Document Reviewed: 10/05/2011 [facility] Patient Information 2014 [place], [place]. Diet for Diarrhea, Adult Frequent, runny stools (diarrhea) may be caused or worsened by food or drink. Diarrhea may be relieved by changing your diet. Since diarrhea can last up to  7 days, it is easy for you to lose too much fluid from the body and become dehydrated. Fluids that are lost need to be replaced. Along with a modified diet, make sure you drink enough fluids to keep your urine clear or pale yellow. DIET INSTRUCTIONS  Ensure  adequate fluid intake (hydration): have 1 cup (8 oz) of fluid for each diarrhea episode. Avoid fluids that contain simple sugars or sports drinks, fruit juices, whole milk products, and sodas. Your urine should be clear or pale yellow if you are drinking enough fluids. Hydrate with an oral rehydration solution that you can purchase at pharmacies, retail stores, and online. You can prepare an oral rehydration solution at home by mixing the following ingredients together:    tsp table salt.   tsp baking soda.   tsp salt substitute containing potassium chloride.  1  tablespoons sugar.  1 L (34 oz) of water.  Certain foods and beverages may increase the speed at which food moves through the gastrointestinal (GI) tract. These foods and beverages should be avoided and include:  Caffeinated and alcoholic beverages.  High-fiber foods, such as raw fruits and vegetables, nuts, seeds, and whole grain breads and cereals.  Foods and beverages sweetened with sugar alcohols, such as xylitol, sorbitol, and mannitol.  Some foods may be well tolerated and may help thicken stool including:  Starchy foods, such as rice, toast, pasta, low-sugar cereal, oatmeal, grits, baked potatoes, crackers, and bagels.   Bananas.   Applesauce.  Add probiotic-rich foods to help increase healthy bacteria in the GI tract, such as yogurt and fermented milk products. RECOMMENDED FOODS AND BEVERAGES Starches Choose foods with less than 2 g of fiber per serving.  Recommended:  White, [place], and pita breads, plain rolls, buns, bagels. Plain muffins, matzo. Soda, saltine, or graham crackers. Pretzels, melba toast, zwieback. Cooked cereals made with water: cornmeal, farina, cream cereals. Dry cereals: refined corn, wheat, rice. Potatoes prepared any way without skins, refined macaroni, spaghetti, noodles, refined rice.  Avoid:  Bread, rolls, or crackers made with whole wheat, multi-grains, rye, bran seeds, nuts, or  coconut. Corn tortillas or taco shells. Cereals containing whole grains, multi-grains, bran, coconut, nuts, raisins. Cooked or dry oatmeal. Coarse wheat cereals, granola. Cereals advertised as "high-fiber." Potato skins. Whole grain pasta, wild or brown rice. Popcorn. Sweet potatoes, yams. Sweet rolls, doughnuts, waffles, pancakes, sweet breads. Vegetables  Recommended: Strained tomato and vegetable juices. Most well-cooked and canned vegetables without seeds. Fresh: Tender lettuce, cucumber without the skin, cabbage, spinach, bean sprouts.  Avoid: Fresh, cooked, or canned: Artichokes, baked beans, beet greens, broccoli, Brussels sprouts, corn, kale, legumes, peas, sweet potatoes. Cooked: Green or red cabbage, spinach. Avoid large servings of any vegetables because vegetables shrink when cooked, and they contain more fiber per serving than fresh vegetables. Fruit  Recommended: Cooked or canned: Apricots, applesauce, cantaloupe, cherries, fruit cocktail, grapefruit, grapes, kiwi, mandarin oranges, peaches, pears, plums, watermelon. Fresh: Apples without skin, ripe banana, grapes, cantaloupe, cherries, grapefruit, peaches, oranges, plums. Keep servings limited to  cup or 1 piece.  Avoid: Fresh: Apples with skin, apricots, mangoes, pears, raspberries, strawberries. Prune juice, stewed or dried prunes. Dried fruits, raisins, dates. Large servings of all fresh fruits. Protein  Recommended: Ground or well-cooked tender beef, ham, veal, lamb, pork, or poultry. Eggs. Fish, oysters, shrimp, lobster, other seafoods. Liver, organ meats.  Avoid: Tough, fibrous meats with gristle. Peanut butter, smooth or chunky. Cheese, nuts, seeds, legumes, dried peas, beans, lentils. Dairy  Recommended: Yogurt, lactose-free  milk, kefir, drinkable yogurt, buttermilk, soy milk, or plain hard cheese.  Avoid: Milk, chocolate milk, beverages made with milk, such as milkshakes. Soups  Recommended: Bouillon, broth, or soups  made from allowed foods. Any strained soup.  Avoid: Soups made from vegetables that are not allowed, cream or milk-based soups. Desserts and Sweets  Recommended: Sugar-free gelatin, sugar-free frozen ice pops made without sugar alcohol.  Avoid: Plain cakes and cookies, pie made with fruit, pudding, custard, cream pie. Gelatin, fruit, ice, sherbet, frozen ice pops. Ice cream, ice milk without nuts. Plain hard candy, honey, jelly, molasses, syrup, sugar, chocolate syrup, gumdrops, marshmallows. Fats and Oils  Recommended: Limit fats to less than 8 tsp per day.  Avoid: Seeds, nuts, olives, avocados. Margarine, butter, cream, mayonnaise, salad oils, plain salad dressings. Plain gravy, crisp bacon without rind. Beverages  Recommended: Water, decaffeinated teas, oral rehydration solutions, sugar-free beverages not sweetened with sugar alcohols.  Avoid: Fruit juices, caffeinated beverages (coffee, tea, soda), alcohol, sports drinks, or lemon-lime soda. Condiments  Recommended: Ketchup, mustard, horseradish, vinegar, cocoa powder. Spices in moderation: allspice, basil, bay leaves, celery powder or leaves, cinnamon, cumin powder, curry powder, ginger, mace, marjoram, onion or garlic powder, oregano, paprika, parsley flakes, ground pepper, rosemary, sage, savory, tarragon, thyme, turmeric.  Avoid: Coconut, honey. Document Released: 04/19/2003 Document Revised: 10/22/2011 Document Reviewed: 06/13/2011 [facility] Patient Information 2014 [place], [place].

## 2012-12-21 NOTE — Progress Notes (Signed)
Pre visit review using our clinic review tool, if applicable. No additional management support is needed unless otherwise documented below in the visit note/SLS  

## 2012-12-22 LAB — RENAL FUNCTION PANEL
Albumin: 4 g/dL (ref 3.5–5.2)
Calcium: 9.5 mg/dL (ref 8.4–10.5)
Potassium: 4 mEq/L (ref 3.5–5.3)
Sodium: 139 mEq/L (ref 135–145)

## 2012-12-22 LAB — HEPATIC FUNCTION PANEL
ALT: 30 U/L (ref 0–35)
AST: 20 U/L (ref 0–37)
Albumin: 4 g/dL (ref 3.5–5.2)
Alkaline Phosphatase: 88 U/L (ref 39–117)
Total Bilirubin: 0.3 mg/dL (ref 0.3–1.2)
Total Protein: 6.7 g/dL (ref 6.0–8.3)

## 2012-12-22 LAB — MAGNESIUM: Magnesium: 2 mg/dL (ref 1.5–2.5)

## 2012-12-22 LAB — CBC
Hemoglobin: 13.2 g/dL (ref 12.0–15.0)
MCH: 31.7 pg (ref 26.0–34.0)
MCV: 92.8 fL (ref 78.0–100.0)
Platelets: 281 10*3/uL (ref 150–400)
RBC: 4.16 MIL/uL (ref 3.87–5.11)
WBC: 9.7 10*3/uL (ref 4.0–10.5)

## 2012-12-22 LAB — SEDIMENTATION RATE: Sed Rate: 25 mm/hr — ABNORMAL HIGH (ref 0–22)

## 2012-12-22 LAB — HEMOGLOBIN A1C
Hgb A1c MFr Bld: 6 % — ABNORMAL HIGH (ref <5.7)
Mean Plasma Glucose: 126 mg/dL — ABNORMAL HIGH (ref <117)

## 2012-12-23 LAB — URINALYSIS
Bilirubin Urine: NEGATIVE
Nitrite: NEGATIVE
Specific Gravity, Urine: 1.005 (ref 1.005–1.030)
Urobilinogen, UA: 0.2 mg/dL (ref 0.0–1.0)
pH: 6 (ref 5.0–8.0)

## 2012-12-23 LAB — URINE CULTURE: Organism ID, Bacteria: NO GROWTH

## 2012-12-24 ENCOUNTER — Other Ambulatory Visit: Payer: Self-pay | Admitting: Family Medicine

## 2012-12-26 ENCOUNTER — Encounter: Payer: Self-pay | Admitting: Family Medicine

## 2012-12-26 DIAGNOSIS — R197 Diarrhea, unspecified: Secondary | ICD-10-CM | POA: Insufficient documentation

## 2012-12-26 DIAGNOSIS — N289 Disorder of kidney and ureter, unspecified: Secondary | ICD-10-CM

## 2012-12-26 DIAGNOSIS — H6120 Impacted cerumen, unspecified ear: Secondary | ICD-10-CM | POA: Insufficient documentation

## 2012-12-26 HISTORY — DX: Disorder of kidney and ureter, unspecified: N28.9

## 2012-12-26 LAB — CLOSTRIDIUM DIFFICILE BY PCR: Toxigenic C. Difficile by PCR: NOT DETECTED

## 2012-12-26 NOTE — Assessment & Plan Note (Signed)
hgba1c 6.0, minimize simple carbs and increase exercise

## 2012-12-26 NOTE — Assessment & Plan Note (Signed)
Mild, will monitor 

## 2012-12-26 NOTE — Assessment & Plan Note (Signed)
Doing well on current meds, no change in Levothyroxine dose

## 2012-12-26 NOTE — Assessment & Plan Note (Signed)
Encouraged probiotics, avoid offending foods, add Benefiber tid and check stool cultures and testing, if no obvious cause may need to followup with GI

## 2012-12-26 NOTE — Assessment & Plan Note (Signed)
Possible early otitis externa. Started on Cortisporin otic and flush ears at next visit if not improving.

## 2012-12-26 NOTE — Assessment & Plan Note (Signed)
Avoid offending foods, add a probiotics, maintain a hi fiber diet

## 2012-12-26 NOTE — Assessment & Plan Note (Signed)
Patient reports doing well on current meds, no changes

## 2012-12-26 NOTE — Progress Notes (Signed)
Patient ID: Margaret [name], female   DOB: [birth year], 62 y.o.   MRN: [id number] Margaret [name] [id number] 03-06-1950 12/26/2012      Progress Note-Follow Up  Subjective  Chief Complaint  Chief Complaint  Patient presents with  . Follow-up    6-mth. [Borderline Hyperlipidemia; Bipolar D/O; Hypothyroidism]    HPI  Patient is a 62 year old Caucasian female who is in followup. She continues to struggle with some fatigue but is otherwise doing fairly well. Is following with behavioral health for her bipolar and borderline disorders and reports feeling well on her current meds. Her biggest issue today is of persistent diarrhea. She describes it as watery and foul smelling. Denies significant abdominal pain, fevers or chills. No nausea vomiting. No bloody or tarry stool. No chest pain, palpitations, shortness of breath, or GU concerns. Is taking medications as prescribed  Past Medical History  Diagnosis Date  . Bipolar disorder   . PTSD (post-traumatic stress disorder)   . Thyroid disease   . Hypoglycemia   . Other malaise and fatigue 09/19/2012  . Hyperglycemia 09/19/2012  . Esophageal reflux 09/19/2012  . Cerumen impaction 12/26/2012  . Renal insufficiency 12/26/2012    Past Surgical History  Procedure Laterality Date  . Tubal ligation      reversal    Family History  Problem Relation Age of Onset  . Breast cancer Neg Hx   . Colon cancer Neg Hx   . Prostate cancer Father   . Diabetes Mother     Brother 1 of 2  . Heart failure Mother   . Hypertension Brother   . Esophageal cancer Maternal Grandmother   . Cancer Paternal Grandmother     cancer of jawbone 1 of 2  . Melanoma Brother   . Parkinsonism Father     deceased    History   Social History  . Marital Status: Divorced    Spouse Name: N/A    Number of Children: N/A  . Years of Education: N/A   Occupational History  . Not on file.   Social History Main Topics  . Smoking status: Former Games developer  . Smokeless tobacco:  Never Used  . Alcohol Use: No  . Drug Use: No  . Sexual Activity: Not on file   Other Topics Concern  . Not on file   Social History Narrative  . No narrative on file    Current Outpatient Prescriptions on File Prior to Visit  Medication Sig Dispense Refill  . amitriptyline (ELAVIL) 50 MG tablet TAKE 1 TABLET (50 MG TOTAL) BY MOUTH AT BEDTIME.  30 tablet  2  . levothyroxine (SYNTHROID, LEVOTHROID) 100 MCG tablet TAKE 1 TABLET (100 MCG TOTAL) BY MOUTH DAILY BEFORE BREAKFAST.  30 tablet  5  . lithium carbonate 300 MG capsule TAKE 1 CAPSULE BY MOUTH EVERY MORNING AND TAKE 2 CAPSULES AT BEDTIME  90 capsule  2  . PRESCRIPTION MEDICATION Apply topically as needed. Psoriasis        . risperiDONE (RISPERDAL) 2 MG tablet TAKE 1 TABLET (2 MG TOTAL) BY MOUTH DAILY.  30 tablet  2   No current facility-administered medications on file prior to visit.    Allergies  Allergen Reactions  . Codeine Nausea Only  . Darvon     Hallucinations   . Sulfa Drugs Cross Reactors Nausea And Vomiting    Review of Systems  Review of Systems  Constitutional: Negative for fever and malaise/fatigue.  HENT: Negative for congestion.   Eyes: Negative for  discharge.  Respiratory: Negative for shortness of breath.   Cardiovascular: Negative for chest pain, palpitations and leg swelling.  Gastrointestinal: Positive for diarrhea. Negative for nausea and abdominal pain.  Genitourinary: Negative for dysuria.  Musculoskeletal: Negative for falls.  Skin: Negative for rash.  Neurological: Negative for loss of consciousness and headaches.  Endo/Heme/Allergies: Negative for polydipsia.  Psychiatric/Behavioral: Negative for depression and suicidal ideas. The patient is not nervous/anxious and does not have insomnia.     Objective  BP 132/84  Pulse 93  Temp(Src) 98.2 F (36.8 C) (Oral)  Resp 16  Ht 5\' 7"  (1.702 m)  Wt 217 lb 4 oz (98.544 kg)  BMI 34.02 kg/m2  SpO2 97%  Physical Exam  Physical Exam   Constitutional: She is oriented to person, place, and time and well-developed, well-nourished, and in no distress. No distress.  HENT:  Head: Normocephalic and atraumatic.  Eyes: Conjunctivae are normal.  Neck: Neck supple. No thyromegaly present.  Cardiovascular: Normal rate, regular rhythm and normal heart sounds.   No murmur heard. Pulmonary/Chest: Effort normal and breath sounds normal. She has no wheezes.  Abdominal: She exhibits no distension and no mass.  Musculoskeletal: She exhibits no edema.  Lymphadenopathy:    She has no cervical adenopathy.  Neurological: She is alert and oriented to person, place, and time.  Skin: Skin is warm and dry. No rash noted. She is not diaphoretic.  Psychiatric: Memory, affect and judgment normal.    Lab Results  Component Value Date   TSH 0.817 12/21/2012   Lab Results  Component Value Date   WBC 9.7 12/21/2012   HGB 13.2 12/21/2012   HCT 38.6 12/21/2012   MCV 92.8 12/21/2012   PLT 281 12/21/2012   Lab Results  Component Value Date   CREATININE 1.17* 12/21/2012   BUN 11 12/21/2012   NA 139 12/21/2012   K 4.0 12/21/2012   CL 105 12/21/2012   CO2 22 12/21/2012   Lab Results  Component Value Date   ALT 30 12/21/2012   AST 20 12/21/2012   ALKPHOS 88 12/21/2012   BILITOT 0.3 12/21/2012   Lab Results  Component Value Date   CHOL 164 07/12/2012   Lab Results  Component Value Date   HDL 41 07/12/2012   Lab Results  Component Value Date   LDLCALC 83 07/12/2012   Lab Results  Component Value Date   TRIG 201* 07/12/2012   Lab Results  Component Value Date   CHOLHDL 4.0 07/12/2012     Assessment & Plan  Bipolar disorder Patient reports doing well on current meds, no changes  Esophageal reflux Avoid offending foods, add a probiotics, maintain a hi fiber diet  Hypothyroidism Doing well on current meds, no change in Levothyroxine dose  Cerumen impaction Possible early otitis externa. Started on Cortisporin otic and flush  ears at next visit if not improving.   Renal insufficiency Mild, will monitor  Hyperglycemia hgba1c 6.0, minimize simple carbs and increase exercise  Diarrhea Encouraged probiotics, avoid offending foods, add Benefiber tid and check stool cultures and testing, if no obvious cause may need to followup with GI

## 2012-12-27 ENCOUNTER — Encounter: Payer: Self-pay | Admitting: Family Medicine

## 2012-12-27 LAB — OVA AND PARASITE SCREEN: OP: NONE SEEN

## 2013-01-17 ENCOUNTER — Encounter (HOSPITAL_COMMUNITY): Payer: Self-pay | Admitting: Psychiatry

## 2013-01-17 ENCOUNTER — Ambulatory Visit (INDEPENDENT_AMBULATORY_CARE_PROVIDER_SITE_OTHER): Payer: 59 | Admitting: Psychiatry

## 2013-01-17 VITALS — BP 152/81 | HR 96 | Ht 67.0 in | Wt 214.8 lb

## 2013-01-17 DIAGNOSIS — F3189 Other bipolar disorder: Secondary | ICD-10-CM

## 2013-01-17 DIAGNOSIS — F319 Bipolar disorder, unspecified: Secondary | ICD-10-CM

## 2013-01-17 MED ORDER — LITHIUM CARBONATE 300 MG PO CAPS: ORAL_CAPSULE | ORAL | Status: DC

## 2013-01-17 MED ORDER — AMITRIPTYLINE HCL 50 MG PO TABS: ORAL_TABLET | ORAL | Status: DC

## 2013-01-17 MED ORDER — RISPERIDONE 2 MG PO TABS: ORAL_TABLET | ORAL | Status: DC

## 2013-01-17 NOTE — Progress Notes (Signed)
[facility] [address] Progress Note  Margaret [name] [id number] 62 y.o.  01/17/2013 1:28 PM  Chief Complaint: Medication management and followup.    History of Present Illness: Waynetta appointment.  She is compliant with her psychotropic medication.  She is only seeing her primary care physician.  Her last creatinine was 1.17.  She stop using ibuprofen.  She had a good Thanksgiving.  She is doing better on her medications other than some time insomnia.  She denies any irritability anger or any mood swing.  She denies any crying spells.  She is taking lithium 900 mg daily, amitriptyline 50 mg daily and Risperdal 2 mg at bedtime.  She has no tremors, stiffness or any EPS.  She wants to continue her current psychotropic medication. She is not using any illegal substance.    Suicidal Ideation: No Plan Formed: No Patient has means to carry out plan: No  Homicidal Ideation: No Plan Formed: No Patient has means to carry out plan: No  ROS Psychiatric: Agitation: No Hallucination: No Depressed Mood: No Insomnia: No Hypersomnia: No Altered Concentration: No Feels Worthless: No Grandiose Ideas: No Belief In Special Powers: No New/Increased Substance Abuse: No Compulsions: No  Neurologic: Headache: Yes Seizure: No Paresthesias: No  Medical History: Patient has a history of bladder problems, obesity and chronic back pain.  Psychosocial history. She is separated since 2005.  She is a single and lives alone.  Outpatient Encounter Prescriptions as of 01/17/2013  Medication Sig  . amitriptyline (ELAVIL) 50 MG tablet TAKE 1 TABLET (50 MG TOTAL) BY MOUTH AT BEDTIME.  Marland Kitchen levothyroxine (SYNTHROID, LEVOTHROID) 100 MCG tablet TAKE 1 TABLET (100 MCG TOTAL) BY MOUTH DAILY BEFORE BREAKFAST.  Marland Kitchen lithium carbonate 300 MG capsule TAKE 1 CAPSULE BY MOUTH EVERY MORNING AND TAKE 2 CAPSULES AT BEDTIME  . NEOMYCIN-POLYMYXIN-HYDROCORTISONE (CORTISPORIN) 1 % SOLN otic solution Place 3 drops into both ears  at bedtime.  Marland Kitchen PRESCRIPTION MEDICATION Apply topically as needed. Psoriasis    . ranitidine (ZANTAC) 300 MG tablet Take 1 tablet (300 mg total) by mouth at bedtime.  . risperiDONE (RISPERDAL) 2 MG tablet TAKE 1 TABLET (2 MG TOTAL) BY MOUTH DAILY.  . [DISCONTINUED] amitriptyline (ELAVIL) 50 MG tablet TAKE 1 TABLET (50 MG TOTAL) BY MOUTH AT BEDTIME.  . [DISCONTINUED] lithium carbonate 300 MG capsule TAKE 1 CAPSULE BY MOUTH EVERY MORNING AND TAKE 2 CAPSULES AT BEDTIME  . [DISCONTINUED] risperiDONE (RISPERDAL) 2 MG tablet TAKE 1 TABLET (2 MG TOTAL) BY MOUTH DAILY.   Recent Results (from the past 2160 hour(s))  CBC     Status: None   Collection Time    12/21/12 12:01 AM      Result Value Range   WBC 9.7  4.0 - 10.5 K/uL   RBC 4.16  3.87 - 5.11 MIL/uL   Hemoglobin 13.2  12.0 - 15.0 g/dL   HCT 19.1  47.8 - 29.5 %   MCV 92.8  78.0 - 100.0 fL   MCH 31.7  26.0 - 34.0 pg   MCHC 34.2  30.0 - 36.0 g/dL   RDW 62.1  30.8 - 65.7 %   Platelets 281  150 - 400 K/uL  SEDIMENTATION RATE     Status: Abnormal   Collection Time    12/21/12 12:01 AM      Result Value Range   Sed Rate 25 (*) 0 - 22 mm/hr  URINALYSIS     Status: None   Collection Time    12/21/12 12:01 AM  Result Value Range   Color, Urine YELLOW  YELLOW   APPearance CLEAR  CLEAR   Specific Gravity, Urine 1.005  1.005 - 1.030   pH 6.0  5.0 - 8.0   Glucose, UA NEG  NEG mg/dL   Bilirubin Urine NEG  NEG   Ketones, ur NEG  NEG mg/dL   Hgb urine dipstick NEG  NEG   Protein, ur NEG  NEG mg/dL   Urobilinogen, UA 0.2  0.0 - 1.0 mg/dL   Nitrite NEG  NEG   Leukocytes, UA NEG  NEG  RENAL FUNCTION PANEL     Status: Abnormal   Collection Time    12/21/12 12:01 AM      Result Value Range   Sodium 139  135 - 145 mEq/L   Potassium 4.0  3.5 - 5.3 mEq/L   Chloride 105  96 - 112 mEq/L   CO2 22  19 - 32 mEq/L   Glucose, Bld 165 (*) 70 - 99 mg/dL   BUN 11  6 - 23 mg/dL   Creat 4.69 (*) 6.29 - 1.10 mg/dL   Albumin 4.0  3.5 - 5.2 g/dL    Calcium 9.5  8.4 - 52.8 mg/dL   Phosphorus 3.7  2.3 - 4.6 mg/dL  HEMOGLOBIN U1L     Status: Abnormal   Collection Time    12/21/12 12:01 AM      Result Value Range   Hemoglobin A1C 6.0 (*) <5.7 %   Comment:                                                                            According to the ADA Clinical Practice Recommendations for 2011, when     HbA1c is used as a screening test:             >=6.5%   Diagnostic of Diabetes Mellitus                (if abnormal result is confirmed)           5.7-6.4%   Increased risk of developing Diabetes Mellitus           References:Diagnosis and Classification of Diabetes Mellitus,Diabetes     Care,2011,34(Suppl 1):S62-S69 and Standards of Medical Care in             Diabetes - 2011,Diabetes Care,2011,34 (Suppl 1):S11-S61.         Mean Plasma Glucose 126 (*) <117 mg/dL  TSH     Status: None   Collection Time    12/21/12 12:01 AM      Result Value Range   TSH 0.817  0.350 - 4.500 uIU/mL  HEPATIC FUNCTION PANEL     Status: None   Collection Time    12/21/12 12:01 AM      Result Value Range   Total Bilirubin 0.3  0.3 - 1.2 mg/dL   Bilirubin, Direct 0.1  0.0 - 0.3 mg/dL   Indirect Bilirubin 0.2  0.0 - 0.9 mg/dL   Alkaline Phosphatase 88  39 - 117 U/L   AST 20  0 - 37 U/L   ALT 30  0 - 35 U/L   Total Protein 6.7  6.0 - 8.3 g/dL  Albumin 4.0  3.5 - 5.2 g/dL  T4, FREE     Status: None   Collection Time    12/21/12 12:01 AM      Result Value Range   Free T4 1.17  0.80 - 1.80 ng/dL  MAGNESIUM     Status: None   Collection Time    12/21/12 12:01 AM      Result Value Range   Magnesium 2.0  1.5 - 2.5 mg/dL  URINE CULTURE     Status: None   Collection Time    12/21/12  6:57 PM      Result Value Range   Organism ID, Bacteria NO GROWTH    STOOL CULTURE     Status: None   Collection Time    12/24/12 11:47 AM      Result Value Range   Organism ID, Bacteria No Salmonella,Shigella,Campylobacter,Yersinia,or     Organism ID, Bacteria  No E.coli 0157:H7 isolated.    OVA AND PARASITE SCREEN     Status: None   Collection Time    12/24/12 11:51 AM      Result Value Range   OP No Ova or Parasites Seen     CLOSTRIDIUM DIFFICILE BY PCR     Status: None   Collection Time    12/24/12 11:53 AM      Result Value Range   C difficile by pcr Not Detected  Not Detected   Comment:       This assay detects the presence of Clostridium difficile DNA coding     for toxin B (tcdB) by real-time polymerase chain reaction (PCR)     amplification.     This test was developed and its performance characteristics have been     determined by [employer]. Performance characteristics refer     to the analytical performance of the test. This test has not been     cleared or approved by the [place] Food and Drug Administration. The FDA     has determined that such clearance or approval is not necessary. This     laboratory is certified under the Clinical Laboratory Improvement     Amendments of 1988 as qualified to perform high complexity clinical     laboratory testing.   Past Psychiatric History/Hospitalization(s): Anxiety: No Bipolar Disorder: Yes Depression: Yes Mania: Yes Psychosis: No Schizophrenia: No Personality Disorder: No Hospitalization for psychiatric illness: Yes History of Electroconvulsive Shock Therapy: No Prior Suicide Attempts: No  Physical Exam: Constitutional:  BP 152/81  Pulse 96  Ht 5\' 7"  (1.702 m)  Wt 214 lb 12.8 oz (97.433 kg)  BMI 33.63 kg/m2  General Appearance: alert, oriented, no acute distress, well nourished and obese  Musculoskeletal: Strength & Muscle Tone: within normal limits Gait & Station: normal Patient leans:  N/A  Psychiatric: Speech (describe rate, volume, coherence, spontaneity, and abnormalities if any): Fast but clear and coherent  Thought Process (describe rate, content, abstract reasoning, and computation): Logical and goal directed.  Associations: Coherent, Relevant and  Intact  Thoughts: normal  Mental Status: Orientation: oriented to person, place, time/date and situation Mood & Affect: hypomanic and Labile Attention Span & Concentration: Fair  Medical Decision Making (Choose Three): Established Problem, Stable/Improving (1), Review or order clinical lab tests (1), Review of Last Therapy Session (1) and Review of Medication Regimen & Side Effects (2)  Assessment: Axis I: Bipolar disorder  Axis II: Deferred  Axis III: Obesity and hypothyroidism   Axis IV: Mild  Axis V: 65-70  Plan:  I will continue her current psychotropic medication.  Followup in 3 months.  Recommend to call us back if she has any question or any concern.  Corisa Montini T., MD 01/17/2013

## 2013-02-08 ENCOUNTER — Encounter: Payer: Self-pay | Admitting: Family Medicine

## 2013-02-08 ENCOUNTER — Ambulatory Visit (INDEPENDENT_AMBULATORY_CARE_PROVIDER_SITE_OTHER): Payer: 59 | Admitting: Family Medicine

## 2013-02-08 VITALS — BP 118/80 | HR 98 | Temp 98.7°F | Ht 67.0 in | Wt 220.4 lb

## 2013-02-08 DIAGNOSIS — Z79899 Other long term (current) drug therapy: Secondary | ICD-10-CM

## 2013-02-08 DIAGNOSIS — N289 Disorder of kidney and ureter, unspecified: Secondary | ICD-10-CM

## 2013-02-08 DIAGNOSIS — M1990 Unspecified osteoarthritis, unspecified site: Secondary | ICD-10-CM

## 2013-02-08 DIAGNOSIS — M129 Arthropathy, unspecified: Secondary | ICD-10-CM

## 2013-02-08 LAB — RENAL FUNCTION PANEL
Albumin: 4.3 g/dL (ref 3.5–5.2)
BUN: 10 mg/dL (ref 6–23)
CO2: 27 mEq/L (ref 19–32)
Chloride: 103 mEq/L (ref 96–112)
Creat: 1.17 mg/dL — ABNORMAL HIGH (ref 0.50–1.10)
Phosphorus: 3.7 mg/dL (ref 2.3–4.6)

## 2013-02-08 LAB — CBC
MCHC: 33.8 g/dL (ref 30.0–36.0)
MCV: 90.2 fL (ref 78.0–100.0)
Platelets: 304 10*3/uL (ref 150–400)
RDW: 14.3 % (ref 11.5–15.5)
WBC: 12 10*3/uL — ABNORMAL HIGH (ref 4.0–10.5)

## 2013-02-08 NOTE — Progress Notes (Signed)
Pre visit review using our clinic review tool, if applicable. No additional management support is needed unless otherwise documented below in the visit note. 

## 2013-02-08 NOTE — Patient Instructions (Signed)
Chronic Kidney Disease Chronic kidney disease occurs when the kidneys are damaged over a long period. The kidneys are two organs that lie on either side of the spine between the middle of the back and the front of the abdomen. The kidneys:   Remove wastes and extra water from the blood.   Produce important hormones. These help keep bones strong, regulate blood pressure, and help create red blood cells.   Balance the fluids and chemicals in the blood and tissues. A small amount of kidney damage may not cause problems, but a large amount of damage may make it difficult or impossible for the kidneys to work the way they should. If steps are not taken to slow down the kidney damage or stop it from getting worse, the kidneys may stop working permanently. Most of the time, chronic kidney disease does not go away. However, it can often be controlled, and those with the disease can usually live normal lives. CAUSES  The most common causes of chronic kidney disease are diabetes and high blood pressure (hypertension). Chronic kidney disease may also be caused by:   Diseases that cause kidneys' filters to become inflamed.   Diseases that affect the immune system.   Genetic diseases.   Medicines that damage the kidneys, such as anti-inflammatory medicines.  Poisoning or exposure to toxic substances.   A reoccurring kidney or urinary infection.   A problem with urine flow. This may be caused by:   Cancer.   Kidney stones.   An enlarged prostate in males. SYMPTOMS  Because the kidney damage in chronic kidney disease occurs slowly, symptoms develop slowly and may not be obvious until the kidney damage becomes severe. A person may have a kidney disease for years without showing any symptoms. Symptoms can include:   Swelling (edema) of the legs, ankles, or feet.   Tiredness (lethargy).   Nausea or vomiting.   Confusion.   Problems with urination, such as:   Decreased urine  production.   Frequent urination, especially at night.   Frequent accidents in children who are potty trained.   Muscle twitches and cramps.   Shortness of breath.  Weakness.   Persistent itchiness.   Loss of appetite.  Metallic taste in the mouth.  Trouble sleeping.  Slowed development in children.  Short stature in children. DIAGNOSIS  Chronic kidney disease may be detected and diagnosed by tests, including blood, urine, imaging, or kidney biopsy tests.  TREATMENT  Most chronic kidney diseases cannot be cured. Treatment usually involves relieving symptoms and preventing or slowing the progression of the disease. Treatment may include:   A special diet. You may need to avoid alcohol and foods thatare salty and high in potassium.   Medicines. These may:   Lower blood pressure.   Relieve anemia.   Relieve swelling.   Protect the bones. HOME CARE INSTRUCTIONS   Follow your prescribed diet.   Only take over-the-counter or prescription medicines as directed by your caregiver.  Do not take any new medicines (prescription, over-the-counter, or nutritional supplements) unless approved by your caregiver. Many medicines can worsen your kidney damage or need to have the dose adjusted.   Quit smoking if you are a smoker. Talk to your caregiver about a smoking cessation program.   Keep all follow-up appointments as directed by your caregiver. SEEK IMMEDIATE MEDICAL CARE IF:  Your symptoms get worse or you develop new symptoms.   You develop symptoms of end-stage kidney disease. These include:   Headaches.     Abnormally dark or light skin.   Numbness in the hands or feet.   Easy bruising.   Frequent hiccups.   Menstruation stops.   You have a fever.   You have decreased urine production.   You havepain or bleeding when urinating. MAKE SURE YOU:  Understand these instructions.  Will watch your condition.  Will get help right  away if you are not doing well or get worse. FOR MORE INFORMATION  American Association of Kidney Patients: www.aakp.org National Kidney Foundation: www.kidney.org American Kidney Fund: www.akfinc.org Life Options Rehabilitation Program: www.lifeoptions.org and www.kidneyschool.org Document Released: 11/06/2007 Document Revised: 01/14/2012 Document Reviewed: 09/26/2011 ExitCare Patient Information 2014 [employer].  

## 2013-02-11 ENCOUNTER — Encounter: Payer: Self-pay | Admitting: Family Medicine

## 2013-02-14 ENCOUNTER — Encounter: Payer: Self-pay | Admitting: Family Medicine

## 2013-02-14 DIAGNOSIS — M1990 Unspecified osteoarthritis, unspecified site: Secondary | ICD-10-CM | POA: Insufficient documentation

## 2013-02-14 HISTORY — DX: Unspecified osteoarthritis, unspecified site: M19.90

## 2013-02-14 NOTE — Assessment & Plan Note (Signed)
Mild, maintain adequate hdyration

## 2013-02-14 NOTE — Assessment & Plan Note (Signed)
May alternate low dose Ibuprofen and low dose Tylenol, stay as active as tolerated.

## 2013-02-14 NOTE — Progress Notes (Signed)
Patient ID: PASCALE [name], female   DOB: Nov 25, 1950, 63 y.o.   MRN: [id number] Margaret [name] [id number] [birth year] 02/14/2013      Progress Note-Follow Up  Subjective  Chief Complaint  Chief Complaint  Patient presents with  . Arthritis    pain    HPI  Patient is a 63 year old female who is in today complaining of diffuse pain. Patient complains of myalgias and arthralgias diffusely. Did get relief with low-dose ibuprofen and Tylenol. Was asked to stop for renal dysfunction by psychiatry. Her pain has worsened since then. No other acute complaints. No fevers or chills, chest pain, palpitations or shortness of breath noted  Past Medical History  Diagnosis Date  . Bipolar disorder   . PTSD (post-traumatic stress disorder)   . Thyroid disease   . Hypoglycemia   . Other malaise and fatigue 09/19/2012  . Hyperglycemia 09/19/2012  . Esophageal reflux 09/19/2012  . Cerumen impaction 12/26/2012  . Renal insufficiency 12/26/2012  . Diarrhea 12/26/2012  . Arthritis 02/14/2013    Past Surgical History  Procedure Laterality Date  . Tubal ligation      reversal    Family History  Problem Relation Age of Onset  . Breast cancer Neg Hx   . Colon cancer Neg Hx   . Prostate cancer Father   . Diabetes Mother     Brother 1 of 2  . Heart failure Mother   . Hypertension Brother   . Esophageal cancer Maternal Grandmother   . Cancer Paternal Grandmother     cancer of jawbone 1 of 2  . Melanoma Brother   . Parkinsonism Father     deceased    History   Social History  . Marital Status: Divorced    Spouse Name: N/A    Number of Children: N/A  . Years of Education: N/A   Occupational History  . Not on file.   Social History Main Topics  . Smoking status: Former Research scientist (life sciences)  . Smokeless tobacco: Never Used  . Alcohol Use: No  . Drug Use: No  . Sexual Activity: Not on file   Other Topics Concern  . Not on file   Social History Narrative  . No narrative on file    Current Outpatient  Prescriptions on File Prior to Visit  Medication Sig Dispense Refill  . amitriptyline (ELAVIL) 50 MG tablet TAKE 1 TABLET (50 MG TOTAL) BY MOUTH AT BEDTIME.  30 tablet  2  . levothyroxine (SYNTHROID, LEVOTHROID) 100 MCG tablet TAKE 1 TABLET (100 MCG TOTAL) BY MOUTH DAILY BEFORE BREAKFAST.  30 tablet  5  . lithium carbonate 300 MG capsule TAKE 1 CAPSULE BY MOUTH EVERY MORNING AND TAKE 2 CAPSULES AT BEDTIME  90 capsule  2  . NEOMYCIN-POLYMYXIN-HYDROCORTISONE (CORTISPORIN) 1 % SOLN otic solution Place 3 drops into both ears at bedtime.  10 mL  0  . PRESCRIPTION MEDICATION Apply topically as needed. Psoriasis        . ranitidine (ZANTAC) 300 MG tablet Take 1 tablet (300 mg total) by mouth at bedtime.  30 tablet  1  . risperiDONE (RISPERDAL) 2 MG tablet TAKE 1 TABLET (2 MG TOTAL) BY MOUTH DAILY.  30 tablet  2   No current facility-administered medications on file prior to visit.    Allergies  Allergen Reactions  . Codeine Nausea Only  . Darvon     Hallucinations   . Sulfa Drugs Cross Reactors Nausea And Vomiting    Review of Systems  Review of  Systems  Constitutional: Negative for fever and malaise/fatigue.  HENT: Negative for congestion.   Eyes: Negative for discharge.  Respiratory: Negative for shortness of breath.   Cardiovascular: Negative for chest pain, palpitations and leg swelling.  Gastrointestinal: Negative for nausea, abdominal pain and diarrhea.  Genitourinary: Negative for dysuria.  Musculoskeletal: Positive for back pain, joint pain and myalgias. Negative for falls.  Skin: Negative for rash.  Neurological: Negative for loss of consciousness and headaches.  Endo/Heme/Allergies: Negative for polydipsia.  Psychiatric/Behavioral: Negative for depression and suicidal ideas. The patient is not nervous/anxious and does not have insomnia.     Objective  BP 118/80  Pulse 98  Temp(Src) 98.7 F (37.1 C) (Oral)  Ht 5\' 7"  (1.702 m)  Wt 220 lb 6.4 oz (99.973 kg)  BMI 34.51  kg/m2  SpO2 95%  Physical Exam  Physical Exam  Constitutional: She is oriented to person, place, and time and well-developed, well-nourished, and in no distress. No distress.  HENT:  Head: Normocephalic and atraumatic.  Eyes: Conjunctivae are normal.  Neck: Neck supple. No thyromegaly present.  Cardiovascular: Normal rate, regular rhythm and normal heart sounds.   No murmur heard. Pulmonary/Chest: Effort normal and breath sounds normal. She has no wheezes.  Abdominal: She exhibits no distension and no mass.  Musculoskeletal: She exhibits no edema.  Lymphadenopathy:    She has no cervical adenopathy.  Neurological: She is alert and oriented to person, place, and time.  Skin: Skin is warm and dry. No rash noted. She is not diaphoretic.  Psychiatric: Memory, affect and judgment normal.    Lab Results  Component Value Date   TSH 0.817 12/21/2012   Lab Results  Component Value Date   WBC 12.0* 02/08/2013   HGB 13.4 02/08/2013   HCT 39.6 02/08/2013   MCV 90.2 02/08/2013   PLT 304 02/08/2013   Lab Results  Component Value Date   CREATININE 1.17* 02/08/2013   BUN 10 02/08/2013   NA 138 02/08/2013   K 4.3 02/08/2013   CL 103 02/08/2013   CO2 27 02/08/2013   Lab Results  Component Value Date   ALT 30 12/21/2012   AST 20 12/21/2012   ALKPHOS 88 12/21/2012   BILITOT 0.3 12/21/2012   Lab Results  Component Value Date   CHOL 164 07/12/2012   Lab Results  Component Value Date   HDL 41 07/12/2012   Lab Results  Component Value Date   LDLCALC 83 07/12/2012   Lab Results  Component Value Date   TRIG 201* 07/12/2012   Lab Results  Component Value Date   CHOLHDL 4.0 07/12/2012     Assessment & Plan  Renal insufficiency Mild, maintain adequate hdyration  Arthritis May alternate low dose Ibuprofen and low dose Tylenol, stay as active as tolerated.

## 2013-02-14 NOTE — Telephone Encounter (Signed)
Please advise mychart message? Pt was advised that MD is out of the office and would return tomorrow (02-15-13)

## 2013-02-23 ENCOUNTER — Other Ambulatory Visit: Payer: Self-pay | Admitting: Family Medicine

## 2013-03-04 ENCOUNTER — Encounter: Payer: Self-pay | Admitting: Family Medicine

## 2013-03-04 ENCOUNTER — Ambulatory Visit (INDEPENDENT_AMBULATORY_CARE_PROVIDER_SITE_OTHER): Payer: 59 | Admitting: Family Medicine

## 2013-03-04 VITALS — BP 122/80 | HR 98 | Temp 98.2°F | Ht 67.0 in | Wt 216.0 lb

## 2013-03-04 DIAGNOSIS — R739 Hyperglycemia, unspecified: Secondary | ICD-10-CM

## 2013-03-04 DIAGNOSIS — B353 Tinea pedis: Secondary | ICD-10-CM

## 2013-03-04 DIAGNOSIS — R7309 Other abnormal glucose: Secondary | ICD-10-CM

## 2013-03-04 MED ORDER — TERBINAFINE 1 % EX GEL: 1.0000 | Freq: Two times a day (BID) | CUTANEOUS | Status: DC

## 2013-03-04 MED ORDER — TERBINAFINE HCL 250 MG PO TABS: 250.0000 mg | ORAL_TABLET | Freq: Every day | ORAL | Status: DC

## 2013-03-04 NOTE — Progress Notes (Signed)
Pre visit review using our clinic review tool, if applicable. No additional management support is needed unless otherwise documented below in the visit note. 

## 2013-03-04 NOTE — Patient Instructions (Signed)
Athlete's Foot Athlete's foot (tinea pedis) is a fungal infection of the skin on the feet. It often occurs on the skin between the toes or underneath the toes. It can also occur on the soles of the feet. Athlete's foot is more likely to occur in hot, humid weather. Not washing your feet or changing your socks often enough can contribute to athlete's foot. The infection can spread from person to person (contagious). CAUSES Athlete's foot is caused by a fungus. This fungus thrives in warm, moist places. Most people get athlete's foot by sharing shower stalls, towels, and wet floors with an infected person. People with weakened immune systems, including those with diabetes, may be more likely to get athlete's foot. SYMPTOMS   Itchy areas between the toes or on the soles of the feet.  White, flaky, or scaly areas between the toes or on the soles of the feet.  Tiny, intensely itchy blisters between the toes or on the soles of the feet.  Tiny cuts on the skin. These cuts can develop a bacterial infection.  Thick or discolored toenails. DIAGNOSIS  Your caregiver can usually tell what the problem is by doing a physical exam. Your caregiver may also take a skin sample from the rash area. The skin sample may be examined under a microscope, or it may be tested to see if fungus will grow in the sample. A sample may also be taken from your toenail for testing. TREATMENT  Over-the-counter and prescription medicines can be used to kill the fungus. These medicines are available as powders or creams. Your caregiver can suggest medicines for you. Fungal infections respond slowly to treatment. You may need to continue using your medicine for several weeks. PREVENTION   Do not share towels.  Wear sandals in wet areas, such as shared locker rooms and shared showers.  Keep your feet dry. Wear shoes that allow air to circulate. Wear cotton or wool socks. HOME CARE INSTRUCTIONS   Take medicines as directed by  your caregiver. Do not use steroid creams on athlete's foot.  Keep your feet clean and cool. Wash your feet daily and dry them thoroughly, especially between your toes.  Change your socks every day. Wear cotton or wool socks. In hot climates, you may need to change your socks 2 to 3 times per day.  Wear sandals or canvas tennis shoes with good air circulation.  If you have blisters, soak your feet in Burow's solution or Epsom salts for 20 to 30 minutes, 2 times a day to dry out the blisters. Make sure you dry your feet thoroughly afterward. SEEK MEDICAL CARE IF:   You have a fever.  You have swelling, soreness, warmth, or redness in your foot.  You are not getting better after 7 days of treatment.  You are not completely cured after 30 days.  You have any problems caused by your medicines. MAKE SURE YOU:   Understand these instructions.  Will watch your condition.  Will get help right away if you are not doing well or get worse. Document Released: 01/25/2000 Document Revised: 04/21/2011 Document Reviewed: 11/15/2010 ExitCare Patient Information 2014 [employer].  

## 2013-03-07 ENCOUNTER — Encounter: Payer: Self-pay | Admitting: Family Medicine

## 2013-03-07 DIAGNOSIS — B353 Tinea pedis: Secondary | ICD-10-CM | POA: Insufficient documentation

## 2013-03-07 NOTE — Progress Notes (Signed)
Patient ID: Margaret [name], female   DOB: Jun 15, 1950, 63 y.o.   MRN: [id number] Margaret [name] [id number] 02-Sep-1950 03/07/2013      Progress Note-Follow Up  Subjective  Chief Complaint  Chief Complaint  Patient presents with  . foot problems    right heel cracked Xyears  . Injections    prevnar    HPI  Patient 63 year old Caucasian female who is in today complaining of foot pain. Has been going on for years. Has to do with very thick, scaly dry skin on het heels. Heals are very thick and skin is split deep on right heel which is very painful when she stepped. No fevers or chills. No puncture discharge. Malaise or myalgias.  Past Medical History  Diagnosis Date  . Bipolar disorder   . PTSD (post-traumatic stress disorder)   . Thyroid disease   . Hypoglycemia   . Other malaise and fatigue 09/19/2012  . Hyperglycemia 09/19/2012  . Esophageal reflux 09/19/2012  . Cerumen impaction 12/26/2012  . Renal insufficiency 12/26/2012  . Diarrhea 12/26/2012  . Arthritis 02/14/2013    Past Surgical History  Procedure Laterality Date  . Tubal ligation      reversal    Family History  Problem Relation Age of Onset  . Breast cancer Neg Hx   . Colon cancer Neg Hx   . Prostate cancer Father   . Diabetes Mother     Brother 1 of 2  . Heart failure Mother   . Hypertension Brother   . Esophageal cancer Maternal Grandmother   . Cancer Paternal Grandmother     cancer of jawbone 1 of 2  . Melanoma Brother   . Parkinsonism Father     deceased    History   Social History  . Marital Status: Divorced    Spouse Name: N/A    Number of Children: N/A  . Years of Education: N/A   Occupational History  . Not on file.   Social History Main Topics  . Smoking status: Former Research scientist (life sciences)  . Smokeless tobacco: Never Used  . Alcohol Use: No  . Drug Use: No  . Sexual Activity: Not on file   Other Topics Concern  . Not on file   Social History Narrative  . No narrative on file    Current Outpatient  Prescriptions on File Prior to Visit  Medication Sig Dispense Refill  . amitriptyline (ELAVIL) 50 MG tablet TAKE 1 TABLET (50 MG TOTAL) BY MOUTH AT BEDTIME.  30 tablet  2  . levothyroxine (SYNTHROID, LEVOTHROID) 100 MCG tablet TAKE 1 TABLET (100 MCG TOTAL) BY MOUTH DAILY BEFORE BREAKFAST.  30 tablet  5  . lithium carbonate 300 MG capsule TAKE 1 CAPSULE BY MOUTH EVERY MORNING AND TAKE 2 CAPSULES AT BEDTIME  90 capsule  2  . NEOMYCIN-POLYMYXIN-HYDROCORTISONE (CORTISPORIN) 1 % SOLN otic solution Place 3 drops into both ears at bedtime.  10 mL  0  . PRESCRIPTION MEDICATION Apply topically as needed. Psoriasis        . ranitidine (ZANTAC) 300 MG tablet TAKE 1 TABLET (300 MG TOTAL) BY MOUTH AT BEDTIME.  30 tablet  3  . risperiDONE (RISPERDAL) 2 MG tablet TAKE 1 TABLET (2 MG TOTAL) BY MOUTH DAILY.  30 tablet  2   No current facility-administered medications on file prior to visit.    Allergies  Allergen Reactions  . Codeine Nausea Only  . Darvon     Hallucinations   . Sulfa Drugs Cross Reactors Nausea  And Vomiting    Review of Systems  Review of Systems  Constitutional: Negative for fever and malaise/fatigue.  HENT: Negative for congestion.   Eyes: Negative for discharge.  Respiratory: Negative for shortness of breath.   Cardiovascular: Negative for chest pain, palpitations and leg swelling.  Gastrointestinal: Negative for nausea, abdominal pain and diarrhea.  Genitourinary: Negative for dysuria.  Musculoskeletal: Positive for joint pain. Negative for falls.       Foot pain at heals  Skin: Negative for rash.  Neurological: Negative for loss of consciousness and headaches.  Endo/Heme/Allergies: Negative for polydipsia.  Psychiatric/Behavioral: Negative for depression and suicidal ideas. The patient is not nervous/anxious and does not have insomnia.     Objective  BP 122/80  Pulse 98  Temp(Src) 98.2 F (36.8 C) (Oral)  Ht 5\' 7"  (1.702 m)  Wt 216 lb 0.6 oz (97.995 kg)  BMI 33.83  kg/m2  SpO2 99%  Physical Exam  Physical Exam  Constitutional: She is oriented to person, place, and time and well-developed, well-nourished, and in no distress. No distress.  HENT:  Head: Normocephalic and atraumatic.  Eyes: Conjunctivae are normal.  Neck: Neck supple. No thyromegaly present.  Cardiovascular: Normal rate, regular rhythm and normal heart sounds.   No murmur heard. Pulmonary/Chest: Effort normal and breath sounds normal. She has no wheezes.  Abdominal: She exhibits no distension and no mass.  Musculoskeletal: She exhibits no edema.  Lymphadenopathy:    She has no cervical adenopathy.  Neurological: She is alert and oriented to person, place, and time.  Skin: Skin is warm and dry. Rash noted. She is not diaphoretic.  Very thick dry, scaly skin b/l feet,skincracking on right heal,no fluctuance, dicharge or warmth  Psychiatric: Memory, affect and judgment normal.    Lab Results  Component Value Date   TSH 0.817 12/21/2012   Lab Results  Component Value Date   WBC 12.0* 02/08/2013   HGB 13.4 02/08/2013   HCT 39.6 02/08/2013   MCV 90.2 02/08/2013   PLT 304 02/08/2013   Lab Results  Component Value Date   CREATININE 1.17* 02/08/2013   BUN 10 02/08/2013   NA 138 02/08/2013   K 4.3 02/08/2013   CL 103 02/08/2013   CO2 27 02/08/2013   Lab Results  Component Value Date   ALT 30 12/21/2012   AST 20 12/21/2012   ALKPHOS 88 12/21/2012   BILITOT 0.3 12/21/2012   Lab Results  Component Value Date   CHOL 164 07/12/2012   Lab Results  Component Value Date   HDL 41 07/12/2012   Lab Results  Component Value Date   LDLCALC 83 07/12/2012   Lab Results  Component Value Date   TRIG 201* 07/12/2012   Lab Results  Component Value Date   CHOLHDL 4.0 07/12/2012     Assessment & Plan  Tinea pedis Severe thickened cracked skin on feet. Started on Terbenafine and referred to podiatry  Hyperglycemia Encouraged to minimize simple carb intake

## 2013-03-07 NOTE — Assessment & Plan Note (Signed)
Severe thickened cracked skin on feet. Started on Terbenafine and referred to podiatry

## 2013-03-07 NOTE — Assessment & Plan Note (Signed)
Encouraged to minimize simple carb intake

## 2013-03-09 ENCOUNTER — Ambulatory Visit: Payer: Self-pay | Admitting: Podiatry

## 2013-03-14 ENCOUNTER — Other Ambulatory Visit: Payer: Self-pay | Admitting: Family Medicine

## 2013-03-14 ENCOUNTER — Ambulatory Visit: Payer: Self-pay | Admitting: Podiatry

## 2013-04-19 ENCOUNTER — Ambulatory Visit (HOSPITAL_COMMUNITY): Payer: Self-pay | Admitting: Psychiatry

## 2013-04-25 ENCOUNTER — Ambulatory Visit (INDEPENDENT_AMBULATORY_CARE_PROVIDER_SITE_OTHER): Payer: 59 | Admitting: Psychiatry

## 2013-04-25 ENCOUNTER — Encounter (HOSPITAL_COMMUNITY): Payer: Self-pay | Admitting: Psychiatry

## 2013-04-25 VITALS — BP 158/86 | HR 96 | Ht 67.0 in | Wt 214.6 lb

## 2013-04-25 DIAGNOSIS — F3189 Other bipolar disorder: Secondary | ICD-10-CM

## 2013-04-25 DIAGNOSIS — F319 Bipolar disorder, unspecified: Secondary | ICD-10-CM

## 2013-04-25 MED ORDER — LITHIUM CARBONATE 300 MG PO CAPS: ORAL_CAPSULE | ORAL | Status: DC

## 2013-04-25 MED ORDER — RISPERIDONE 2 MG PO TABS: ORAL_TABLET | ORAL | Status: DC

## 2013-04-25 MED ORDER — AMITRIPTYLINE HCL 50 MG PO TABS: ORAL_TABLET | ORAL | Status: DC

## 2013-04-25 NOTE — Progress Notes (Signed)
[facility] [phone] Progress Note  Margaret [name] [id number] 63 y.o.  04/25/2013 1:59 PM  Chief Complaint: Medication management and followup.    History of Present Illness: [name] came for her followup appointment .  She is compliant with her psychotropic medication.  She continues two work part-time but she is hoping to quit one job in the future.  She easily seen her primary care physician and there has been no changes in her medication.  She has complete blood work however she has no lithium level.  Patient denies any agitation, anger or any mood swing.  She is sleeping okay.  She denies any hallucination or any paranoia.  She denies any crying spells.  She wants to continue her current psychotropic medication.  She is not using any illegal substance.    Suicidal Ideation: No Plan Formed: No Patient has means to carry out plan: No  Homicidal Ideation: No Plan Formed: No Patient has means to carry out plan: No  ROS Psychiatric: Agitation: No Hallucination: No Depressed Mood: No Insomnia: No Hypersomnia: No Altered Concentration: No Feels Worthless: No Grandiose Ideas: No Belief In Special Powers: No New/Increased Substance Abuse: No Compulsions: No  Neurologic: Headache: Yes Seizure: No Paresthesias: No  Medical History: Patient has a history of bladder problems, obesity and chronic back pain.  Psychosocial history. She is separated since 2005.  She is a single and lives alone.  Outpatient Encounter Prescriptions as of 04/25/2013  Medication Sig  . amitriptyline (ELAVIL) 50 MG tablet TAKE 1 TABLET (50 MG TOTAL) BY MOUTH AT BEDTIME.  Marland Kitchen lithium carbonate 300 MG capsule TAKE 1 CAPSULE BY MOUTH EVERY MORNING AND TAKE 2 CAPSULES AT BEDTIME  . risperiDONE (RISPERDAL) 2 MG tablet TAKE 1 TABLET (2 MG TOTAL) BY MOUTH DAILY.  . [DISCONTINUED] amitriptyline (ELAVIL) 50 MG tablet TAKE 1 TABLET (50 MG TOTAL) BY MOUTH AT BEDTIME.  . [DISCONTINUED] lithium carbonate 300 MG  capsule TAKE 1 CAPSULE BY MOUTH EVERY MORNING AND TAKE 2 CAPSULES AT BEDTIME  . [DISCONTINUED] risperiDONE (RISPERDAL) 2 MG tablet TAKE 1 TABLET (2 MG TOTAL) BY MOUTH DAILY.  Marland Kitchen levothyroxine (SYNTHROID, LEVOTHROID) 100 MCG tablet TAKE 1 TABLET (100 MCG TOTAL) BY MOUTH DAILY BEFORE BREAKFAST.  Marland Kitchen NEOMYCIN-POLYMYXIN-HYDROCORTISONE (CORTISPORIN) 1 % SOLN otic solution Place 3 drops into both ears at bedtime.  Marland Kitchen PRESCRIPTION MEDICATION Apply topically as needed. Psoriasis    . ranitidine (ZANTAC) 300 MG tablet TAKE 1 TABLET (300 MG TOTAL) BY MOUTH AT BEDTIME.  Marland Kitchen terbinafine (LAMISIL) 250 MG tablet Take 1 tablet (250 mg total) by mouth daily.  . Terbinafine 1 % GEL Apply 1 applicator topically 2 (two) times daily.   Recent Results (from the past 2160 hour(s))  RENAL FUNCTION PANEL     Status: Abnormal   Collection Time    02/08/13  3:57 PM      Result Value Ref Range   Sodium 138  135 - 145 mEq/L   Potassium 4.3  3.5 - 5.3 mEq/L   Chloride 103  96 - 112 mEq/L   CO2 27  19 - 32 mEq/L   Glucose, Bld 94  70 - 99 mg/dL   BUN 10  6 - 23 mg/dL   Creat 1.17 (*) 0.50 - 1.10 mg/dL   Albumin 4.3  3.5 - 5.2 g/dL   Calcium 9.7  8.4 - 10.5 mg/dL   Phosphorus 3.7  2.3 - 4.6 mg/dL  CBC     Status: Abnormal   Collection Time  02/08/13  3:57 PM      Result Value Ref Range   WBC 12.0 (*) 4.0 - 10.5 K/uL   RBC 4.39  3.87 - 5.11 MIL/uL   Hemoglobin 13.4  12.0 - 15.0 g/dL   HCT 39.6  36.0 - 46.0 %   MCV 90.2  78.0 - 100.0 fL   MCH 30.5  26.0 - 34.0 pg   MCHC 33.8  30.0 - 36.0 g/dL   RDW 14.3  11.5 - 15.5 %   Platelets 304  150 - 400 K/uL   Past Psychiatric History/Hospitalization(s): Anxiety: No Bipolar Disorder: Yes Depression: Yes Mania: Yes Psychosis: No Schizophrenia: No Personality Disorder: No Hospitalization for psychiatric illness: Yes History of Electroconvulsive Shock Therapy: No Prior Suicide Attempts: No  Physical Exam: Constitutional:  BP 158/86  Pulse 96  Ht 5\' 7"  (1.702  m)  Wt 214 lb 9.6 oz (97.342 kg)  BMI 33.60 kg/m2  General Appearance: alert, oriented, no acute distress, well nourished and obese  Musculoskeletal: Strength & Muscle Tone: within normal limits Gait & Station: normal Patient leans:  N/A  Psychiatric: Speech (describe rate, volume, coherence, spontaneity, and abnormalities if any): Fast but clear and coherent  Thought Process (describe rate, content, abstract reasoning, and computation): Logical and goal directed.  Associations: Coherent, Relevant and Intact  Thoughts: normal  Mental Status: Orientation: oriented to person, place, time/date and situation Mood & Affect: hypomanic and Labile Attention Span & Concentration: Fair  Established Problem, Stable/Improving (1), Review or order clinical lab tests (1), Review of Last Therapy Session (1) and Review of Medication Regimen & Side Effects (2)  Assessment: Axis I: Bipolar disorder  Axis II: Deferred  Axis III: Obesity and hypothyroidism   Axis IV: Mild  Axis V: 65-70   Plan:  I review her blood work and her current medication.  I will order lithium level since she does not have any lithium level done.  I will continue her current psychotropic medication which is amitriptyline 50 mg at bedtime, lithium carbonate 300 mg in the morning and 600 mg at bedtime and Risperdal 2 mg at bedtime.  I discussed the risk and benefits of medication .  Recommended to call us back if she has any question or any concern.  Followup in 3 months.  Braeden Dolinski T., MD 04/25/2013

## 2013-05-24 ENCOUNTER — Encounter: Payer: Self-pay | Admitting: Family Medicine

## 2013-05-30 ENCOUNTER — Encounter: Payer: Self-pay | Admitting: Family

## 2013-05-30 ENCOUNTER — Ambulatory Visit (INDEPENDENT_AMBULATORY_CARE_PROVIDER_SITE_OTHER): Payer: 59 | Admitting: Family

## 2013-05-30 VITALS — BP 108/80 | HR 91 | Temp 98.1°F | Resp 18 | Ht 67.0 in | Wt 219.1 lb

## 2013-05-30 DIAGNOSIS — M5440 Lumbago with sciatica, unspecified side: Secondary | ICD-10-CM

## 2013-05-30 DIAGNOSIS — M79605 Pain in left leg: Secondary | ICD-10-CM

## 2013-05-30 DIAGNOSIS — M5430 Sciatica, unspecified side: Secondary | ICD-10-CM

## 2013-05-30 DIAGNOSIS — H6120 Impacted cerumen, unspecified ear: Secondary | ICD-10-CM

## 2013-05-30 DIAGNOSIS — M545 Low back pain, unspecified: Secondary | ICD-10-CM

## 2013-05-30 DIAGNOSIS — R300 Dysuria: Secondary | ICD-10-CM

## 2013-05-30 DIAGNOSIS — N399 Disorder of urinary system, unspecified: Secondary | ICD-10-CM

## 2013-05-30 DIAGNOSIS — R3989 Other symptoms and signs involving the genitourinary system: Secondary | ICD-10-CM | POA: Insufficient documentation

## 2013-05-30 LAB — POCT URINALYSIS DIPSTICK
Bilirubin, UA: NEGATIVE
Blood, UA: NEGATIVE
Glucose, UA: NEGATIVE
Ketones, UA: NEGATIVE
Leukocytes, UA: NEGATIVE
Nitrite, UA: NEGATIVE
Protein, UA: 0.2
Spec Grav, UA: <=1.005
Urobilinogen, UA: NEGATIVE
pH, UA: 6.5

## 2013-05-30 NOTE — Assessment & Plan Note (Signed)
UA is unremarkable. Will send urine for culture.

## 2013-05-30 NOTE — Patient Instructions (Signed)
You will be contacted about your MRI. You may use tylenol as needed for back pain.  Use over the counter ear wax softening drops such as debrox or ceruminex.  Apply drops once a week and then flush with warm water using a bulb syringe remove wax build up.

## 2013-05-30 NOTE — Progress Notes (Signed)
Subjective:    Patient ID: Margaret [name], female    DOB: Nov 24, 1950, 63 y.o.   MRN: [id number]  HPI  Ms. Margaret [name] is a 63 yr old female who presents today with two concerns:  1) urinary discomfort- mild, no frank dysuria.    2) Low back pain- pain is located in the lower back and radiates down the left leg. Has been present x 6 months.  Has associated numbness of the left leg. Also pain radiates in to the right buttock.  She reports that she takes 2 ibuprofen at night which "dulls pain at night."    Review of Systems See HPI  Past Medical History  Diagnosis Date  . Bipolar disorder   . PTSD (post-traumatic stress disorder)   . Thyroid disease   . Hypoglycemia   . Other malaise and fatigue 09/19/2012  . Hyperglycemia 09/19/2012  . Esophageal reflux 09/19/2012  . Cerumen impaction 12/26/2012  . Renal insufficiency 12/26/2012  . Diarrhea 12/26/2012  . Arthritis 02/14/2013    History   Social History  . Marital Status: Divorced    Spouse Name: N/A    Number of Children: N/A  . Years of Education: N/A   Occupational History  . Not on file.   Social History Main Topics  . Smoking status: Former Research scientist (life sciences)  . Smokeless tobacco: Never Used  . Alcohol Use: No  . Drug Use: No  . Sexual Activity: Not on file   Other Topics Concern  . Not on file   Social History Narrative  . No narrative on file    Past Surgical History  Procedure Laterality Date  . Tubal ligation      reversal    Family History  Problem Relation Age of Onset  . Breast cancer Neg Hx   . Colon cancer Neg Hx   . Prostate cancer Father   . Diabetes Mother     Brother 1 of 2  . Heart failure Mother   . Hypertension Brother   . Esophageal cancer Maternal Grandmother   . Cancer Paternal Grandmother     cancer of jawbone 1 of 2  . Melanoma Brother   . Parkinsonism Father     deceased    Allergies  Allergen Reactions  . Codeine Nausea Only  . Darvon     Hallucinations   . Sulfa Drugs Cross Reactors  Nausea And Vomiting    Current Outpatient Prescriptions on File Prior to Visit  Medication Sig Dispense Refill  . amitriptyline (ELAVIL) 50 MG tablet TAKE 1 TABLET (50 MG TOTAL) BY MOUTH AT BEDTIME.  30 tablet  2  . levothyroxine (SYNTHROID, LEVOTHROID) 100 MCG tablet TAKE 1 TABLET (100 MCG TOTAL) BY MOUTH DAILY BEFORE BREAKFAST.  30 tablet  5  . lithium carbonate 300 MG capsule TAKE 1 CAPSULE BY MOUTH EVERY MORNING AND TAKE 2 CAPSULES AT BEDTIME  90 capsule  2  . PRESCRIPTION MEDICATION Apply topically as needed. Psoriasis        . ranitidine (ZANTAC) 300 MG tablet TAKE 1 TABLET (300 MG TOTAL) BY MOUTH AT BEDTIME.  30 tablet  3  . risperiDONE (RISPERDAL) 2 MG tablet TAKE 1 TABLET (2 MG TOTAL) BY MOUTH DAILY.  30 tablet  2   No current facility-administered medications on file prior to visit.    BP 108/80  Pulse 91  Temp(Src) 98.1 F (36.7 C) (Oral)  Resp 18  Ht 5\' 7"  (1.702 m)  Wt 219 lb 1.3 oz (99.374 kg)  BMI 34.30 kg/m2  SpO2 98%        Objective:   Physical Exam  Constitutional: She is oriented to person, place, and time. She appears well-developed and well-nourished. No distress.  HENT:  Head: Normocephalic and atraumatic.  Mouth/Throat: No oropharyngeal exudate, posterior oropharyngeal edema or posterior oropharyngeal erythema.  Bilateral cerumen impaction is noted.  Cardiovascular: Normal rate and regular rhythm.   No murmur heard. Pulmonary/Chest: Effort normal and breath sounds normal. No respiratory distress. She has no wheezes. She has no rales. She exhibits no tenderness.  Neurological: She is alert and oriented to person, place, and time.  Psychiatric: She has a normal mood and affect. Her behavior is normal. Judgment and thought content normal.          Assessment & Plan:

## 2013-05-30 NOTE — Progress Notes (Signed)
Pre visit review using our clinic review tool, if applicable. No additional management support is needed unless otherwise documented below in the visit note. 

## 2013-05-31 ENCOUNTER — Ambulatory Visit: Payer: Self-pay | Admitting: Family Medicine

## 2013-05-31 DIAGNOSIS — M5440 Lumbago with sciatica, unspecified side: Secondary | ICD-10-CM

## 2013-05-31 DIAGNOSIS — M545 Low back pain, unspecified: Secondary | ICD-10-CM | POA: Insufficient documentation

## 2013-05-31 NOTE — Assessment & Plan Note (Signed)
Ceruminosis is noted.  Wax is removed by syringing and manual debridement. Instructions for home care to prevent wax buildup are given.  

## 2013-05-31 NOTE — Assessment & Plan Note (Signed)
Will refer for MRI of the lumbar spine. She is intolerant to prednisone and muscle relaxers and cannot take NSAIDS due to renal insufficiency.  Further recommendations pending MRI results.

## 2013-06-11 ENCOUNTER — Ambulatory Visit (HOSPITAL_BASED_OUTPATIENT_CLINIC_OR_DEPARTMENT_OTHER): Payer: 59

## 2013-06-14 ENCOUNTER — Ambulatory Visit: Payer: Self-pay | Admitting: Family Medicine

## 2013-06-20 ENCOUNTER — Ambulatory Visit (INDEPENDENT_AMBULATORY_CARE_PROVIDER_SITE_OTHER): Payer: 59 | Admitting: Family Medicine

## 2013-06-20 ENCOUNTER — Other Ambulatory Visit: Payer: Self-pay | Admitting: Family Medicine

## 2013-06-20 ENCOUNTER — Encounter: Payer: Self-pay | Admitting: Family Medicine

## 2013-06-20 VITALS — BP 130/82 | HR 99 | Temp 98.0°F | Ht 67.0 in | Wt 217.1 lb

## 2013-06-20 DIAGNOSIS — N289 Disorder of kidney and ureter, unspecified: Secondary | ICD-10-CM

## 2013-06-20 DIAGNOSIS — R5381 Other malaise: Secondary | ICD-10-CM

## 2013-06-20 DIAGNOSIS — D72829 Elevated white blood cell count, unspecified: Secondary | ICD-10-CM

## 2013-06-20 DIAGNOSIS — R5383 Other fatigue: Principal | ICD-10-CM

## 2013-06-20 DIAGNOSIS — K219 Gastro-esophageal reflux disease without esophagitis: Secondary | ICD-10-CM

## 2013-06-20 DIAGNOSIS — R7309 Other abnormal glucose: Secondary | ICD-10-CM

## 2013-06-20 DIAGNOSIS — R739 Hyperglycemia, unspecified: Secondary | ICD-10-CM

## 2013-06-20 DIAGNOSIS — R0602 Shortness of breath: Secondary | ICD-10-CM

## 2013-06-20 DIAGNOSIS — R197 Diarrhea, unspecified: Secondary | ICD-10-CM

## 2013-06-20 DIAGNOSIS — E039 Hypothyroidism, unspecified: Secondary | ICD-10-CM

## 2013-06-20 LAB — CBC
HEMATOCRIT: 39.2 % (ref 36.0–46.0)
HEMOGLOBIN: 13.3 g/dL (ref 12.0–15.0)
MCH: 30.2 pg (ref 26.0–34.0)
MCHC: 33.9 g/dL (ref 30.0–36.0)
MCV: 89.1 fL (ref 78.0–100.0)
Platelets: 277 10*3/uL (ref 150–400)
RBC: 4.4 MIL/uL (ref 3.87–5.11)
RDW: 14 % (ref 11.5–15.5)
WBC: 11.6 10*3/uL — ABNORMAL HIGH (ref 4.0–10.5)

## 2013-06-20 LAB — RENAL FUNCTION PANEL
Albumin: 4 g/dL (ref 3.5–5.2)
BUN: 11 mg/dL (ref 6–23)
CALCIUM: 9.7 mg/dL (ref 8.4–10.5)
CO2: 24 meq/L (ref 19–32)
Chloride: 106 mEq/L (ref 96–112)
Creat: 1.35 mg/dL — ABNORMAL HIGH (ref 0.50–1.10)
GLUCOSE: 121 mg/dL — AB (ref 70–99)
Phosphorus: 3.8 mg/dL (ref 2.3–4.6)
Potassium: 4.1 mEq/L (ref 3.5–5.3)
SODIUM: 139 meq/L (ref 135–145)

## 2013-06-20 LAB — TSH: TSH: 2.201 u[IU]/mL (ref 0.350–4.500)

## 2013-06-20 NOTE — Assessment & Plan Note (Signed)
On Levothyroxine, continue to monitor 

## 2013-06-20 NOTE — Assessment & Plan Note (Signed)
hgba1c today, minimize simple carbs. Increase exercise as tolerated.

## 2013-06-20 NOTE — Patient Instructions (Signed)

## 2013-06-20 NOTE — Assessment & Plan Note (Signed)
resolved 

## 2013-06-20 NOTE — Progress Notes (Signed)
Patient ID: Margaret [name], female   DOB: Jul 06, 1950, 63 y.o.   MRN: [id number] Margaret [name] [id number] 09-01-1950 06/20/2013      Progress Note-Follow Up  Subjective  Chief Complaint  Chief Complaint  Patient presents with  . Follow-up    4 month    HPI  Patient is a 63 year old female in today for routine medical care. She is doing better than at her last visit her Diovan abdominal discomfort resolved. She has not had any other recent illness. Denies CP/palp/SOB/HA/congestion/fevers/GI or GU c/o. Taking meds as prescribed  Past Medical History  Diagnosis Date  . Bipolar disorder   . PTSD (post-traumatic stress disorder)   . Thyroid disease   . Hypoglycemia   . Other malaise and fatigue 09/19/2012  . Hyperglycemia 09/19/2012  . Esophageal reflux 09/19/2012  . Cerumen impaction 12/26/2012  . Renal insufficiency 12/26/2012  . Diarrhea 12/26/2012  . Arthritis 02/14/2013    Past Surgical History  Procedure Laterality Date  . Tubal ligation      reversal    Family History  Problem Relation Age of Onset  . Breast cancer Neg Hx   . Colon cancer Neg Hx   . Prostate cancer Father   . Diabetes Mother     Brother 1 of 2  . Heart failure Mother   . Hypertension Brother   . Esophageal cancer Maternal Grandmother   . Cancer Paternal Grandmother     cancer of jawbone 1 of 2  . Melanoma Brother   . Parkinsonism Father     deceased    History   Social History  . Marital Status: Divorced    Spouse Name: N/A    Number of Children: N/A  . Years of Education: N/A   Occupational History  . Not on file.   Social History Main Topics  . Smoking status: Former Research scientist (life sciences)  . Smokeless tobacco: Never Used  . Alcohol Use: No  . Drug Use: No  . Sexual Activity: Not on file   Other Topics Concern  . Not on file   Social History Narrative  . No narrative on file    Current Outpatient Prescriptions on File Prior to Visit  Medication Sig Dispense Refill  . amitriptyline (ELAVIL) 50  MG tablet TAKE 1 TABLET (50 MG TOTAL) BY MOUTH AT BEDTIME.  30 tablet  2  . levothyroxine (SYNTHROID, LEVOTHROID) 100 MCG tablet TAKE 1 TABLET (100 MCG TOTAL) BY MOUTH DAILY BEFORE BREAKFAST.  30 tablet  5  . lithium carbonate 300 MG capsule TAKE 1 CAPSULE BY MOUTH EVERY MORNING AND TAKE 2 CAPSULES AT BEDTIME  90 capsule  2  . PRESCRIPTION MEDICATION Apply topically as needed. Psoriasis        . ranitidine (ZANTAC) 300 MG tablet TAKE 1 TABLET (300 MG TOTAL) BY MOUTH AT BEDTIME.  30 tablet  3  . risperiDONE (RISPERDAL) 2 MG tablet TAKE 1 TABLET (2 MG TOTAL) BY MOUTH DAILY.  30 tablet  2   No current facility-administered medications on file prior to visit.    Allergies  Allergen Reactions  . Codeine Nausea Only  . Darvon     Hallucinations   . Sulfa Drugs Cross Reactors Nausea And Vomiting    Review of Systems  Review of Systems  Constitutional: Positive for malaise/fatigue. Negative for fever.  HENT: Negative for congestion.   Eyes: Negative for pain and discharge.  Respiratory: Negative for shortness of breath.   Cardiovascular: Negative for chest pain,  palpitations and leg swelling.  Gastrointestinal: Negative for nausea, abdominal pain and diarrhea.  Genitourinary: Negative for dysuria.  Musculoskeletal: Negative for falls.  Skin: Negative for rash.  Neurological: Negative for loss of consciousness and headaches.  Endo/Heme/Allergies: Negative for polydipsia.  Psychiatric/Behavioral: Negative for depression and suicidal ideas. The patient is nervous/anxious. The patient does not have insomnia.     Objective  BP 130/82  Pulse 99  Temp(Src) 98 F (36.7 C) (Oral)  Ht 5\' 7"  (1.702 m)  Wt 217 lb 1.9 oz (98.485 kg)  BMI 34.00 kg/m2  SpO2 95%  Physical Exam Physical Exam  Constitutional: She is oriented to person, place, and time and well-developed, well-nourished, and in no distress. No distress.  HENT:  Head: Normocephalic and atraumatic.  Eyes: Conjunctivae are  normal.  Neck: Neck supple. No thyromegaly present.  Cardiovascular: Normal rate, regular rhythm and normal heart sounds.   No murmur heard. Pulmonary/Chest: Effort normal and breath sounds normal. She has no wheezes.  Abdominal: She exhibits no distension and no mass.  Musculoskeletal: She exhibits no edema.  Lymphadenopathy:    She has no cervical adenopathy.  Neurological: She is alert and oriented to person, place, and time.  Skin: Skin is warm and dry. No rash noted. She is not diaphoretic.  Psychiatric: Memory, affect and judgment normal.    Lab Results  Component Value Date   TSH 0.817 12/21/2012   Lab Results  Component Value Date   WBC 12.0* 02/08/2013   HGB 13.4 02/08/2013   HCT 39.6 02/08/2013   MCV 90.2 02/08/2013   PLT 304 02/08/2013   Lab Results  Component Value Date   CREATININE 1.17* 02/08/2013   BUN 10 02/08/2013   NA 138 02/08/2013   K 4.3 02/08/2013   CL 103 02/08/2013   CO2 27 02/08/2013   Lab Results  Component Value Date   ALT 30 12/21/2012   AST 20 12/21/2012   ALKPHOS 88 12/21/2012   BILITOT 0.3 12/21/2012   Lab Results  Component Value Date   CHOL 164 07/12/2012   Lab Results  Component Value Date   HDL 41 07/12/2012   Lab Results  Component Value Date   LDLCALC 83 07/12/2012   Lab Results  Component Value Date   TRIG 201* 07/12/2012   Lab Results  Component Value Date   CHOLHDL 4.0 07/12/2012     Assessment & Plan  Esophageal reflux Avoid offending foods, start probiotics. Do not eat large meals in late evening and consider raising head of bed.   Hypothyroidism On Levothyroxine, continue to monitor  Renal insufficiency Mild, continue to monitor, maintain adequate hydration  Hyperglycemia hgba1c today, minimize simple carbs. Increase exercise as tolerated.   Diarrhea resolved

## 2013-06-20 NOTE — Assessment & Plan Note (Signed)
Mild, continue to monitor, maintain adequate hydration

## 2013-06-20 NOTE — Progress Notes (Signed)
Pre visit review using our clinic review tool, if applicable. No additional management support is needed unless otherwise documented below in the visit note. 

## 2013-06-20 NOTE — Assessment & Plan Note (Signed)
Avoid offending foods, start probiotics. Do not eat large meals in late evening and consider raising head of bed.  

## 2013-06-21 LAB — HEMOGLOBIN A1C
HEMOGLOBIN A1C: 6.2 % — AB (ref <5.7)
Mean Plasma Glucose: 131 mg/dL — ABNORMAL HIGH (ref <117)

## 2013-07-18 ENCOUNTER — Telehealth: Payer: Self-pay

## 2013-07-18 NOTE — Telephone Encounter (Signed)
FMLA paperwork dropped off and put on mds desk

## 2013-07-20 NOTE — Telephone Encounter (Signed)
Left a detailed message stating that per md we need to know if the fmla paperwork needs to be filled out according to the back pain from April or and ongoing issue? If ongoing when did all this start?

## 2013-07-20 NOTE — Telephone Encounter (Signed)
Pt called back stating this is chronic and ongoing back pain

## 2013-07-22 NOTE — Telephone Encounter (Signed)
Left detailed message on machine, paperwork ready to pick up.

## 2013-07-26 ENCOUNTER — Ambulatory Visit (HOSPITAL_COMMUNITY): Payer: Self-pay | Admitting: Psychiatry

## 2013-07-27 ENCOUNTER — Other Ambulatory Visit (HOSPITAL_COMMUNITY): Payer: Self-pay | Admitting: Psychiatry

## 2013-07-29 LAB — LITHIUM LEVEL: Lithium Lvl: 1 mEq/L (ref 0.80–1.40)

## 2013-08-08 ENCOUNTER — Ambulatory Visit (HOSPITAL_COMMUNITY): Payer: Self-pay | Admitting: Psychiatry

## 2013-08-15 ENCOUNTER — Ambulatory Visit (HOSPITAL_COMMUNITY): Payer: Self-pay | Admitting: Psychiatry

## 2013-08-15 ENCOUNTER — Ambulatory Visit (INDEPENDENT_AMBULATORY_CARE_PROVIDER_SITE_OTHER): Payer: 59 | Admitting: Psychiatry

## 2013-08-15 ENCOUNTER — Encounter (HOSPITAL_COMMUNITY): Payer: Self-pay | Admitting: Psychiatry

## 2013-08-15 VITALS — BP 151/75 | HR 100 | Ht 67.0 in | Wt 221.0 lb

## 2013-08-15 DIAGNOSIS — F319 Bipolar disorder, unspecified: Secondary | ICD-10-CM

## 2013-08-15 MED ORDER — CARBAMAZEPINE ER 100 MG PO TB12: 100.0000 mg | ORAL_TABLET | Freq: Two times a day (BID) | ORAL | Status: DC

## 2013-08-15 MED ORDER — RISPERIDONE 2 MG PO TABS: ORAL_TABLET | ORAL | Status: DC

## 2013-08-15 MED ORDER — AMITRIPTYLINE HCL 50 MG PO TABS: ORAL_TABLET | ORAL | Status: DC

## 2013-08-15 NOTE — Progress Notes (Signed)
[place] [phone] Progress Note  Margaret [name] [id number] 63 y.o.  08/15/2013 1:13 PM  Chief Complaint: Medication management and followup.    History of Present Illness: Margaret [name] came for her followup appointment .  She states that her medication but recently she's been visiting to her primary care physician because she is complaining of foot pain .  She had a blood work which shows creatinine 1.35 .  Her lithium level is 1.0.she is working full-time at [employer] in [employer].  She likes her job but she admitted also stressful.  She sleeping on and off.  She is concerned about her physical health.  Her hemoglobin A1c is 6.2.  She denies any agitation, anger, mood swing but endorse racing thoughts and frustration.  She denies any paranoia or hallucination.  She denies any suicidal thoughts or homicidal thoughts.    Suicidal Ideation: No Plan Formed: No Patient has means to carry out plan: No  Homicidal Ideation: No Plan Formed: No Patient has means to carry out plan: No  ROS Psychiatric: Agitation: No Hallucination: No Depressed Mood: No Insomnia: No Hypersomnia: No Altered Concentration: No Feels Worthless: No Grandiose Ideas: No Belief In Special Powers: No New/Increased Substance Abuse: No Compulsions: No  Neurologic: Headache: Yes Seizure: No Paresthesias: No  Medical History: Patient has a history of bladder problems, obesity and chronic back pain.  Psychosocial history. She is separated since 2005.  She is a single and lives alone.  Outpatient Encounter Prescriptions as of 08/15/2013  Medication Sig  . amitriptyline (ELAVIL) 50 MG tablet TAKE 1 TABLET BY MOUTH AT BEDTIME.  Marland Kitchen levothyroxine (SYNTHROID, LEVOTHROID) 100 MCG tablet TAKE 1 TABLET (100 MCG TOTAL) BY MOUTH DAILY BEFORE BREAKFAST.  Marland Kitchen risperiDONE (RISPERDAL) 2 MG tablet TAKE 1 TABLET BY MOUTH DAILY.  . [DISCONTINUED] amitriptyline (ELAVIL) 50 MG tablet TAKE 1 TABLET BY MOUTH AT BEDTIME.  .  [DISCONTINUED] lithium carbonate 300 MG capsule TAKE 1 CAPSULE BY MOUTH EVERY MORNING AND TAKE 2 CAPSULES AT BEDTIME  . [DISCONTINUED] risperiDONE (RISPERDAL) 2 MG tablet TAKE 1 TABLET BY MOUTH DAILY.  . carbamazepine (TEGRETOL-XR) 100 MG 12 hr tablet Take 1 tablet (100 mg total) by mouth 2 (two) times daily.  Marland Kitchen PRESCRIPTION MEDICATION Apply topically as needed. Psoriasis    . ranitidine (ZANTAC) 300 MG tablet TAKE 1 TABLET (300 MG TOTAL) BY MOUTH AT BEDTIME.   Recent Results (from the past 2160 hour(s))  POCT URINALYSIS DIPSTICK     Status: None   Collection Time    05/30/13  1:46 PM      Result Value Ref Range   Color, UA yellow     Clarity, UA clear     Glucose, UA negative     Bilirubin, UA negative     Ketones, UA negative     Spec Grav, UA <=1.005     Blood, UA negative     pH, UA 6.5     Protein, UA 0.2     Urobilinogen, UA negative     Nitrite, UA negative     Leukocytes, UA Negative    CBC     Status: Abnormal   Collection Time    06/20/13  4:49 PM      Result Value Ref Range   WBC 11.6 (*) 4.0 - 10.5 K/uL   RBC 4.40  3.87 - 5.11 MIL/uL   Hemoglobin 13.3  12.0 - 15.0 g/dL   HCT 39.2  36.0 - 46.0 %   MCV  89.1  78.0 - 100.0 fL   MCH 30.2  26.0 - 34.0 pg   MCHC 33.9  30.0 - 36.0 g/dL   RDW 14.0  11.5 - 15.5 %   Platelets 277  150 - 400 K/uL  RENAL FUNCTION PANEL     Status: Abnormal   Collection Time    06/20/13  4:49 PM      Result Value Ref Range   Sodium 139  135 - 145 mEq/L   Potassium 4.1  3.5 - 5.3 mEq/L   Chloride 106  96 - 112 mEq/L   CO2 24  19 - 32 mEq/L   Glucose, Bld 121 (*) 70 - 99 mg/dL   BUN 11  6 - 23 mg/dL   Creat 1.35 (*) 0.50 - 1.10 mg/dL   Albumin 4.0  3.5 - 5.2 g/dL   Calcium 9.7  8.4 - 10.5 mg/dL   Phosphorus 3.8  2.3 - 4.6 mg/dL  TSH     Status: None   Collection Time    06/20/13  4:49 PM      Result Value Ref Range   TSH 2.201  0.350 - 4.500 uIU/mL  HEMOGLOBIN A1C     Status: Abnormal   Collection Time    06/20/13  4:49 PM       Result Value Ref Range   Hemoglobin A1C 6.2 (*) <5.7 %   Comment:                                                                            According to the ADA Clinical Practice Recommendations for 2011, when     HbA1c is used as a screening test:             >=6.5%   Diagnostic of Diabetes Mellitus                (if abnormal result is confirmed)           5.7-6.4%   Increased risk of developing Diabetes Mellitus           References:Diagnosis and Classification of Diabetes Mellitus,Diabetes     IEPP,2951,88(CZYSA 1):S62-S69 and Standards of Medical Care in             Diabetes - 2011,Diabetes Care,2011,34 (Suppl 1):S11-S61.         Mean Plasma Glucose 131 (*) <117 mg/dL  LITHIUM LEVEL     Status: None   Collection Time    07/28/13  9:17 AM      Result Value Ref Range   Lithium Lvl 1.00  0.80 - 1.40 mEq/L   Past Psychiatric History/Hospitalization(s): Anxiety: No Bipolar Disorder: Yes Depression: Yes Mania: Yes Psychosis: No Schizophrenia: No Personality Disorder: No Hospitalization for psychiatric illness: Yes History of Electroconvulsive Shock Therapy: No Prior Suicide Attempts: No  Physical Exam: Constitutional:  BP 151/75  Pulse 100  Ht 5\' 7"  (1.702 m)  Wt 221 lb (100.245 kg)  BMI 34.61 kg/m2  General Appearance: alert, oriented, no acute distress, well nourished and obese  Musculoskeletal: Strength & Muscle Tone: within normal limits Gait & Station: normal Patient leans:  N/A  Mental status examination Patient is casually dressed and fairly groomed.  Her speech  is fast but clear and coherent.  She describes her mood as anxious and her affect is mood appropriate.  She denies any auditory or visual hallucination.  She denies any active or passive suicidal thoughts or homicidal thoughts.  There was no paranoia or any delusions.  Her attention and concentration is fair.  She has no tremors or shakes.  Her thought processes logical and goal-directed.  Her  psychomotor activity is slightly increased.  Her fund of knowledge is good.  She is alert and oriented x3.  Her insight judgment and impulse control is okay.  Established Problem, Stable/Improving (1), Review of Psycho-Social Stressors (1), Review or order clinical lab tests (1), Review and summation of old records (2), Review of Last Therapy Session (1), Review of Medication Regimen & Side Effects (2) and Review of New Medication or Change in Dosage (2)  Assessment: Axis I: Bipolar disorder  Axis II: Deferred  Axis III: Obesity and hypothyroidism   Axis IV: Mild  Axis V: 65-70   Plan:  I review her blood work, collateral information from her primary care physician and lithium level.  Her creatinine is 1.35 which is high the normal.  We discussed lithium side effects.  Recommended to try a different medication for mood stabilizer.  Patient after some discussion he agreed to try Tegretol.  I am reluctant to increase her Risperdal since she had hemoglobin A1c 6.2.  I will discontinue lithium and try Tegretol 100 mg twice a day.  Discussed the metabolic side effects, hematological side effects, sedation and grogginess with the medication.  Continue Risperdal and amitriptyline at present dose.  I will see her again in 2 weeks, consider increasing the dose of Tegretol if needed.  Recommended to call us back if she has any question or any concern. Time spent 25 minutes.  More than 50% of the time spent in psychoeducation, counseling and coordination of care.  Discuss safety plan that anytime having active suicidal thoughts or homicidal thoughts then patient need to call 911 or go to the local emergency room.  Dailin Sosnowski T., MD 08/15/2013

## 2013-08-29 ENCOUNTER — Ambulatory Visit (INDEPENDENT_AMBULATORY_CARE_PROVIDER_SITE_OTHER): Payer: 59 | Admitting: Psychiatry

## 2013-08-29 ENCOUNTER — Encounter (HOSPITAL_COMMUNITY): Payer: Self-pay | Admitting: Psychiatry

## 2013-08-29 VITALS — BP 150/83 | HR 92 | Ht 67.0 in | Wt 219.0 lb

## 2013-08-29 DIAGNOSIS — F319 Bipolar disorder, unspecified: Secondary | ICD-10-CM

## 2013-08-29 MED ORDER — CARBAMAZEPINE ER 100 MG PO TB12: 100.0000 mg | ORAL_TABLET | Freq: Two times a day (BID) | ORAL | Status: DC

## 2013-08-29 MED ORDER — AMITRIPTYLINE HCL 50 MG PO TABS: ORAL_TABLET | ORAL | Status: DC

## 2013-08-29 MED ORDER — RISPERIDONE 2 MG PO TABS: ORAL_TABLET | ORAL | Status: DC

## 2013-08-29 NOTE — Progress Notes (Addendum)
[facility] [phone] Progress Note  DELECIA [name] [id number] 63 y.o.  08/29/2013 11:05 AM  Chief Complaint: Medication management and followup.    History of Present Illness: Margaret [name] came for her followup appointment .  She is taking Tegretol which was started on her last visit .  Her lithium was discontinued because of the high creatinine.  Patient is complaining of diarrhea however she does not believe it is related to the medication.  She started diarrhea 2 days ago and now she is taking over-the-counter antidiarrheal medication.  She does not want to stop Tegretol because she feels his medicine is helping her .  She does not have highs or lows.  She is sleeping better other than she has diarrhea.  She has good energy level.  She is more social active however she has noticed some time crying spells but does not believe it is because of sadness.  She is relieved that she does not have thirst and excessive sedation.  She has no tremors or shakes.  She wants to try this new medication little longer.  She denies any agitation, anger, mood swing.  She denies any paranoia or any hallucination.  She is working and she lives alone.  Her vitals are stable.  Suicidal Ideation: No Plan Formed: No Patient has means to carry out plan: No  Homicidal Ideation: No Plan Formed: No Patient has means to carry out plan: No  Review of Systems  Gastrointestinal: Positive for nausea and diarrhea.  Skin: Negative for itching and rash.   Psychiatric: Agitation: No Hallucination: No Depressed Mood: No Insomnia: No Hypersomnia: No Altered Concentration: No Feels Worthless: No Grandiose Ideas: No Belief In Special Powers: No New/Increased Substance Abuse: No Compulsions: No  Neurologic: Headache: Yes Seizure: No Paresthesias: No  Medical History:  Her primary care physician is Margaret [name]. Patient has a history of bladder problems, obesity and chronic back pain.  Psychosocial history. She is  separated since 2005.  She is a single and lives alone.  Outpatient Encounter Prescriptions as of 08/29/2013  Medication Sig  . amitriptyline (ELAVIL) 50 MG tablet TAKE 1 TABLET BY MOUTH AT BEDTIME.  . carbamazepine (TEGRETOL-XR) 100 MG 12 hr tablet Take 1 tablet (100 mg total) by mouth 2 (two) times daily.  . risperiDONE (RISPERDAL) 2 MG tablet TAKE 1 TABLET BY MOUTH DAILY.  . [DISCONTINUED] amitriptyline (ELAVIL) 50 MG tablet TAKE 1 TABLET BY MOUTH AT BEDTIME.  . [DISCONTINUED] carbamazepine (TEGRETOL-XR) 100 MG 12 hr tablet Take 1 tablet (100 mg total) by mouth 2 (two) times daily.  . [DISCONTINUED] risperiDONE (RISPERDAL) 2 MG tablet TAKE 1 TABLET BY MOUTH DAILY.  Marland Kitchen levothyroxine (SYNTHROID, LEVOTHROID) 100 MCG tablet TAKE 1 TABLET (100 MCG TOTAL) BY MOUTH DAILY BEFORE BREAKFAST.  Marland Kitchen PRESCRIPTION MEDICATION Apply topically as needed. Psoriasis    . ranitidine (ZANTAC) 300 MG tablet TAKE 1 TABLET (300 MG TOTAL) BY MOUTH AT BEDTIME.   Recent Results (from the past 2160 hour(s))  CBC     Status: Abnormal   Collection Time    06/20/13  4:49 PM      Result Value Ref Range   WBC 11.6 (*) 4.0 - 10.5 K/uL   RBC 4.40  3.87 - 5.11 MIL/uL   Hemoglobin 13.3  12.0 - 15.0 g/dL   HCT 39.2  36.0 - 46.0 %   MCV 89.1  78.0 - 100.0 fL   MCH 30.2  26.0 - 34.0 pg   MCHC 33.9  30.0 -  36.0 g/dL   RDW 14.0  11.5 - 15.5 %   Platelets 277  150 - 400 K/uL  RENAL FUNCTION PANEL     Status: Abnormal   Collection Time    06/20/13  4:49 PM      Result Value Ref Range   Sodium 139  135 - 145 mEq/L   Potassium 4.1  3.5 - 5.3 mEq/L   Chloride 106  96 - 112 mEq/L   CO2 24  19 - 32 mEq/L   Glucose, Bld 121 (*) 70 - 99 mg/dL   BUN 11  6 - 23 mg/dL   Creat 1.35 (*) 0.50 - 1.10 mg/dL   Albumin 4.0  3.5 - 5.2 g/dL   Calcium 9.7  8.4 - 10.5 mg/dL   Phosphorus 3.8  2.3 - 4.6 mg/dL  TSH     Status: None   Collection Time    06/20/13  4:49 PM      Result Value Ref Range   TSH 2.201  0.350 - 4.500 uIU/mL   HEMOGLOBIN A1C     Status: Abnormal   Collection Time    06/20/13  4:49 PM      Result Value Ref Range   Hemoglobin A1C 6.2 (*) <5.7 %   Comment:                                                                            According to the ADA Clinical Practice Recommendations for 2011, when     HbA1c is used as a screening test:             >=6.5%   Diagnostic of Diabetes Mellitus                (if abnormal result is confirmed)           5.7-6.4%   Increased risk of developing Diabetes Mellitus           References:Diagnosis and Classification of Diabetes Mellitus,Diabetes     FVCB,4496,75(FFMBW 1):S62-S69 and Standards of Medical Care in             Diabetes - 2011,Diabetes Care,2011,34 (Suppl 1):S11-S61.         Mean Plasma Glucose 131 (*) <117 mg/dL  LITHIUM LEVEL     Status: None   Collection Time    07/28/13  9:17 AM      Result Value Ref Range   Lithium Lvl 1.00  0.80 - 1.40 mEq/L   Past Psychiatric History/Hospitalization(s): Anxiety: No Bipolar Disorder: Yes Depression: Yes Mania: Yes Psychosis: No Schizophrenia: No Personality Disorder: No Hospitalization for psychiatric illness: Yes History of Electroconvulsive Shock Therapy: No Prior Suicide Attempts: No  Physical Exam: Constitutional:  BP 150/83  Pulse 92  Ht 5\' 7"  (1.702 m)  Wt 219 lb (99.338 kg)  BMI 34.29 kg/m2  General Appearance: alert, oriented, no acute distress, well nourished and obese  Musculoskeletal: Strength & Muscle Tone: within normal limits Gait & Station: normal Patient leans:  N/A  Mental status examination Patient is casually dressed and fairly groomed.  Her speech is fast but clear and coherent.  She describes her mood euthymic and her affect is neutral.  She denies any auditory  or visual hallucination.  She denies any active or passive suicidal thoughts or homicidal thoughts.  There was no paranoia or any delusions.  Her attention and concentration is fair.  She has no tremors or  shakes.  Her thought processes logical and goal-directed.  Her psychomotor activity is slightly increased.  Her fund of knowledge is good.  She is alert and oriented x3.  Her insight judgment and impulse control is okay.  Established Problem, Stable/Improving (1), Review of Psycho-Social Stressors (1), Review of Last Therapy Session (1) and Review of Medication Regimen & Side Effects (2)  Assessment: Axis I: Bipolar disorder  Axis II: Deferred  Axis III: Obesity and hypothyroidism   Axis IV: Mild  Axis V: 65-70   Plan:  At this time I will not further increase the dose of Tegretol.  Explain that she should watch carefully her diarrhea and if he does not stop and she should contact her primary care physician.  I also suggested that she should call us if diarrhea does not stop.  I will see her again in 4 weeks .  We will consider blood for altered adjusting the dose of Tegretol.  Continue Risperdal the present dose.  Continue amitriptyline at present dose.  Follow up in 4 weeks.  Signora Zucco T., MD 08/29/2013

## 2013-09-12 ENCOUNTER — Other Ambulatory Visit: Payer: Self-pay | Admitting: Family Medicine

## 2013-09-26 ENCOUNTER — Ambulatory Visit (HOSPITAL_COMMUNITY): Payer: Self-pay | Admitting: Psychiatry

## 2013-10-10 ENCOUNTER — Other Ambulatory Visit (HOSPITAL_COMMUNITY): Payer: Self-pay | Admitting: Psychiatry

## 2013-10-10 ENCOUNTER — Ambulatory Visit (INDEPENDENT_AMBULATORY_CARE_PROVIDER_SITE_OTHER): Payer: 59 | Admitting: Psychiatry

## 2013-10-10 ENCOUNTER — Encounter (HOSPITAL_COMMUNITY): Payer: Self-pay | Admitting: Psychiatry

## 2013-10-10 VITALS — BP 139/81 | HR 83 | Ht 67.0 in | Wt 208.4 lb

## 2013-10-10 DIAGNOSIS — Z79899 Other long term (current) drug therapy: Secondary | ICD-10-CM

## 2013-10-10 DIAGNOSIS — F319 Bipolar disorder, unspecified: Secondary | ICD-10-CM

## 2013-10-10 MED ORDER — RISPERIDONE 2 MG PO TABS: ORAL_TABLET | ORAL | Status: DC

## 2013-10-10 MED ORDER — CARBAMAZEPINE ER 100 MG PO TB12: 100.0000 mg | ORAL_TABLET | Freq: Two times a day (BID) | ORAL | Status: DC

## 2013-10-10 MED ORDER — AMITRIPTYLINE HCL 50 MG PO TABS: ORAL_TABLET | ORAL | Status: DC

## 2013-10-10 NOTE — Progress Notes (Signed)
[place] [phone] Progress Note  AMIJAH TIMOTHY [id number] 63 y.o.  10/10/2013 11:05 AM  Chief Complaint: Medication management and followup.    History of Present Illness: Margaret [name] came for her followup appointment .  She likes Tegretol better than lithium.  She is able to loss of 9 pounds since her last visit.  She has good energy level.  She denies any irritability or any anger.  She described her mood is stable and she is able to sleep at night.  She denies any diarrhea or any GI symptoms.  We switched from lithium on July 6 visit because her creatinine was slightly high.  Patient denies any side effects of medication.  She is more active and social.  She is watching her calorie intake.  She started going to a gym everyday.  Her vitals are stable.  Patient lives on herself.  She is working in some time she reported her job as a stressful period she does not drink or use any illegal substances.  Suicidal Ideation: No Plan Formed: No Patient has means to carry out plan: No  Homicidal Ideation: No Plan Formed: No Patient has means to carry out plan: No  Review of Systems  Skin: Negative for itching and rash.   Psychiatric: Agitation: No Hallucination: No Depressed Mood: No Insomnia: No Hypersomnia: No Altered Concentration: No Feels Worthless: No Grandiose Ideas: No Belief In Special Powers: No New/Increased Substance Abuse: No Compulsions: No  Neurologic: Headache: Yes Seizure: No Paresthesias: No  Medical History:  Her primary care physician is Penni Homans. Patient has a history of bladder problems, obesity and chronic back pain.  Psychosocial history. She is separated since 2005.  She is a single and lives alone.  Outpatient Encounter Prescriptions as of 10/10/2013  Medication Sig  . amitriptyline (ELAVIL) 50 MG tablet TAKE 1 TABLET BY MOUTH AT BEDTIME.  . carbamazepine (TEGRETOL-XR) 100 MG 12 hr tablet Take 1 tablet (100 mg total) by mouth 2 (two) times  daily.  Marland Kitchen levothyroxine (SYNTHROID, LEVOTHROID) 100 MCG tablet TAKE 1 TABLET BY MOUTH DAILY BEFORE BREAKFAST.  Marland Kitchen PRESCRIPTION MEDICATION Apply topically as needed. Psoriasis    . ranitidine (ZANTAC) 300 MG tablet TAKE 1 TABLET (300 MG TOTAL) BY MOUTH AT BEDTIME.  Marland Kitchen risperiDONE (RISPERDAL) 2 MG tablet TAKE 1 TABLET BY MOUTH DAILY.  . [DISCONTINUED] amitriptyline (ELAVIL) 50 MG tablet TAKE 1 TABLET BY MOUTH AT BEDTIME.  . [DISCONTINUED] carbamazepine (TEGRETOL-XR) 100 MG 12 hr tablet Take 1 tablet (100 mg total) by mouth 2 (two) times daily.  . [DISCONTINUED] risperiDONE (RISPERDAL) 2 MG tablet TAKE 1 TABLET BY MOUTH DAILY.   Recent Results (from the past 2160 hour(s))  LITHIUM LEVEL     Status: None   Collection Time    07/28/13  9:17 AM      Result Value Ref Range   Lithium Lvl 1.00  0.80 - 1.40 mEq/L   Past Psychiatric History/Hospitalization(s): Anxiety: No Bipolar Disorder: Yes Depression: Yes Mania: Yes Psychosis: No Schizophrenia: No Personality Disorder: No Hospitalization for psychiatric illness: Yes History of Electroconvulsive Shock Therapy: No Prior Suicide Attempts: No  Physical Exam: Constitutional:  BP 139/81  Pulse 83  Ht 5\' 7"  (1.702 m)  Wt 208 lb 6.4 oz (94.53 kg)  BMI 32.63 kg/m2  General Appearance: alert, oriented, no acute distress, well nourished and obese  Musculoskeletal: Strength & Muscle Tone: within normal limits Gait & Station: normal Patient leans:  N/A  Mental status examination Patient is casually dressed  and fairly groomed.  Her speech is fast but clear and coherent.  She describes her mood good and her affect is appropriate.  She denies any auditory or visual hallucination.  She denies any active or passive suicidal thoughts or homicidal thoughts.  There was no paranoia or any delusions.  Her attention and concentration is fair.  She has no tremors or shakes.  Her thought processes logical and goal-directed.  Her psychomotor activity is  slightly increased.  Her fund of knowledge is good.  She is alert and oriented x3.  Her insight judgment and impulse control is okay.  Established Problem, Stable/Improving (1), Review of Psycho-Social Stressors (1), Review or order clinical lab tests (1), Review of Last Therapy Session (1) and Review of Medication Regimen & Side Effects (2)  Assessment: Axis I: Bipolar disorder  Axis II: Deferred  Axis III: Obesity and hypothyroidism   Axis IV: Mild  Axis V: 65-70   Plan:  Patient is doing better on current dose of Tegretol.  She is taking 100 mg twice a day.  Her diarrhea is dissolved.  She likes Tegretol and wants to continue current dosage.  We will do Tegretol level and basic metabolic panel since she had high creatinine on her last blood work.  Recommended to continue Risperdal 2 mg at bedtime and amitriptyline 50 mg at bedtime.  I will see her again in 2 months.  Margaret Radney T., MD 10/10/2013

## 2013-11-07 ENCOUNTER — Ambulatory Visit (INDEPENDENT_AMBULATORY_CARE_PROVIDER_SITE_OTHER): Payer: 59 | Admitting: Medical

## 2013-11-07 ENCOUNTER — Encounter: Payer: Self-pay | Admitting: Medical

## 2013-11-07 VITALS — BP 122/79 | HR 84 | Temp 98.1°F | Ht 65.75 in | Wt 204.8 lb

## 2013-11-07 DIAGNOSIS — E039 Hypothyroidism, unspecified: Secondary | ICD-10-CM

## 2013-11-07 DIAGNOSIS — R5381 Other malaise: Secondary | ICD-10-CM

## 2013-11-07 DIAGNOSIS — L659 Nonscarring hair loss, unspecified: Secondary | ICD-10-CM

## 2013-11-07 DIAGNOSIS — R5383 Other fatigue: Secondary | ICD-10-CM

## 2013-11-07 LAB — CBC WITH DIFFERENTIAL/PLATELET
Basophils Absolute: 0 10*3/uL (ref 0.0–0.1)
Basophils Relative: 0.6 % (ref 0.0–3.0)
EOS PCT: 4.3 % (ref 0.0–5.0)
Eosinophils Absolute: 0.2 10*3/uL (ref 0.0–0.7)
HCT: 41.6 % (ref 36.0–46.0)
Hemoglobin: 13.9 g/dL (ref 12.0–15.0)
LYMPHS ABS: 1.1 10*3/uL (ref 0.7–4.0)
LYMPHS PCT: 22.2 % (ref 12.0–46.0)
MCHC: 33.3 g/dL (ref 30.0–36.0)
MCV: 91.7 fl (ref 78.0–100.0)
MONOS PCT: 11.8 % (ref 3.0–12.0)
Monocytes Absolute: 0.6 10*3/uL (ref 0.1–1.0)
Neutro Abs: 3 10*3/uL (ref 1.4–7.7)
Neutrophils Relative %: 61.1 % (ref 43.0–77.0)
PLATELETS: 222 10*3/uL (ref 150.0–400.0)
RBC: 4.54 Mil/uL (ref 3.87–5.11)
RDW: 14 % (ref 11.5–15.5)
WBC: 4.8 10*3/uL (ref 4.0–10.5)

## 2013-11-07 LAB — COMPREHENSIVE METABOLIC PANEL
ALT: 26 U/L (ref 0–35)
AST: 19 U/L (ref 0–37)
Albumin: 4.2 g/dL (ref 3.5–5.2)
Alkaline Phosphatase: 82 U/L (ref 39–117)
BILIRUBIN TOTAL: 0.4 mg/dL (ref 0.2–1.2)
BUN: 22 mg/dL (ref 6–23)
CALCIUM: 9.4 mg/dL (ref 8.4–10.5)
CHLORIDE: 108 meq/L (ref 96–112)
CO2: 24 meq/L (ref 19–32)
Creatinine, Ser: 1.2 mg/dL (ref 0.4–1.2)
GFR: 48.67 mL/min — AB (ref >60.00)
Glucose, Bld: 91 mg/dL (ref 70–99)
Potassium: 4.5 mEq/L (ref 3.5–5.1)
SODIUM: 138 meq/L (ref 135–145)
Total Protein: 7 g/dL (ref 6.0–8.3)

## 2013-11-07 LAB — TSH: TSH: 0.71 u[IU]/mL (ref 0.35–4.50)

## 2013-11-07 NOTE — Assessment & Plan Note (Signed)
Will check her tsh today.

## 2013-11-07 NOTE — Patient Instructions (Addendum)
I will do some basic labs for  your mild fatigue(Your weight loss was purposeful so won't do work up for the weight loss). Diffuse hair loss(alopecia) can be related to thyroid disorder, rapid weight loss, anemia and some nutritional deficiencies. We will do some labs today. If no cause found consider referral to dermatologist.  Follow up in 2 weeks or as needed.

## 2013-11-07 NOTE — Progress Notes (Signed)
   Subjective:    Patient ID: Margaret [name], female    DOB: 1950-09-03, 63 y.o.   MRN: [id number]  HPI  Pt states her hair is falling out in handfuls. She states hx of some hair loss when her thyroid has been out of control. Pt states recent purposeful weight loss through diet. Low fat diet. Pt states when comes has to clean her hair brush after combing  and sometimes will just fall. States no more than usual stress.(High stress always). Females in her family did not suffer hair loss. Also in past with weight loss will loose hair. Pt is not on lithium past 3 months.  She states he energy level waxes and wanes over past 2 wks. One day hardly any energy. Next day fine. No bruising reported. NO dark/black stools reported.    Review of Systems  Constitutional: Negative for chills and fatigue.  HENT: Negative.   Respiratory: Negative for cough, choking, chest tightness, wheezing and stridor.   Cardiovascular: Negative for chest pain and palpitations.  Gastrointestinal: Negative.   Genitourinary: Negative.   Musculoskeletal: Negative for back pain, myalgias and neck pain.  Skin:       Diffuse hair loss over past month.  Neurological: Negative.   Psychiatric/Behavioral: Negative.        No mood problems reported today.       Objective:   Physical Exam  Constitutional: She is oriented to person, place, and time. She appears well-developed and well-nourished. No distress.  HENT:  Head: Normocephalic and atraumatic.  Eyes: Conjunctivae are normal. Pupils are equal, round, and reactive to light.  Neck: Normal range of motion. Neck supple. No JVD present. No tracheal deviation present. No thyromegaly present.  Cardiovascular: Normal rate, regular rhythm and normal heart sounds.   Pulmonary/Chest: Effort normal and breath sounds normal. No respiratory distress. She has no wheezes. She has no rales. She exhibits no tenderness.  Lymphadenopathy:    She has no cervical adenopathy.  Neurological:  She is alert and oriented to person, place, and time.  Skin: She is not diaphoretic.  Mild diffuse hair loss on inspection of her scalp. Mostly at part line. No patches of baldness noted.  Psychiatric: She has a normal mood and affect. Judgment and thought content normal.    Diffusely mild thining of hair. No bald spots. Thinning most obvious on her part line.        Assessment & Plan:

## 2013-11-07 NOTE — Assessment & Plan Note (Signed)
Diffuse type may be related to hypothyroid or anemia. Will check labs first. Note she is no longer on lithium. Pt has had recent purposeful weight loss. She states in past with weight loss would notice thinning of hair. If labs negative and hairl oss persisting consider referral to dermatologist.

## 2013-11-07 NOTE — Assessment & Plan Note (Signed)
Mild fatigue recently and along with tsh will check cbc and cmp.

## 2013-11-08 LAB — BASIC METABOLIC PANEL WITH GFR
BUN: 21 mg/dL (ref 6–23)
CALCIUM: 9.7 mg/dL (ref 8.4–10.5)
CHLORIDE: 108 meq/L (ref 96–112)
CO2: 26 mEq/L (ref 19–32)
Creat: 1.22 mg/dL — ABNORMAL HIGH (ref 0.50–1.10)
GFR, Est African American: 54 mL/min — ABNORMAL LOW
GFR, Est Non African American: 47 mL/min — ABNORMAL LOW
Glucose, Bld: 92 mg/dL (ref 70–99)
Potassium: 4.9 mEq/L (ref 3.5–5.3)
SODIUM: 142 meq/L (ref 135–145)

## 2013-11-08 LAB — CARBAMAZEPINE LEVEL, TOTAL: CARBAMAZEPINE LVL: 4.5 ug/mL (ref 4.0–12.0)

## 2013-11-09 ENCOUNTER — Telehealth: Payer: Self-pay | Admitting: Family Medicine

## 2013-11-09 DIAGNOSIS — L659 Nonscarring hair loss, unspecified: Secondary | ICD-10-CM

## 2013-11-09 NOTE — Telephone Encounter (Signed)
Refer to dermatology for alopecia.

## 2013-11-09 NOTE — Telephone Encounter (Signed)
Please check the patient's chart to verify the provider that was seen before sending message, as this patient was seen by Percell Miller, and this can extend the process time for response to the patient/SLS Thanks.

## 2013-11-09 NOTE — Telephone Encounter (Signed)
Caller name: Markel Relation to pt: self  Call back number: [phone]   Reason for call:   inquring about results from 11/07/2013, patient received a missed call and can not retrieve message from off cell phone.please call home [phone]

## 2013-12-05 ENCOUNTER — Other Ambulatory Visit (HOSPITAL_COMMUNITY): Payer: Self-pay | Admitting: Psychiatry

## 2013-12-19 ENCOUNTER — Encounter (HOSPITAL_COMMUNITY): Payer: Self-pay | Admitting: Psychiatry

## 2013-12-19 ENCOUNTER — Ambulatory Visit (INDEPENDENT_AMBULATORY_CARE_PROVIDER_SITE_OTHER): Payer: 59 | Admitting: Psychiatry

## 2013-12-19 VITALS — BP 135/61 | HR 85 | Ht 67.0 in | Wt 200.2 lb

## 2013-12-19 DIAGNOSIS — F319 Bipolar disorder, unspecified: Secondary | ICD-10-CM

## 2013-12-19 MED ORDER — AMITRIPTYLINE HCL 50 MG PO TABS: ORAL_TABLET | ORAL | Status: DC

## 2013-12-19 MED ORDER — CARBAMAZEPINE ER 200 MG PO CP12: 200.0000 mg | ORAL_CAPSULE | Freq: Two times a day (BID) | ORAL | Status: DC

## 2013-12-19 MED ORDER — RISPERIDONE 2 MG PO TABS: ORAL_TABLET | ORAL | Status: DC

## 2013-12-19 NOTE — Progress Notes (Signed)
[facility] [phone] Progress Note  Margaret [name] [id number] 63 y.o.  12/19/2013 2:29 PM  Chief Complaint: I am feeling depressed and anxious.  I don't think my medicine is working.   History of Present Illness: Margaret [name] came for her followup appointment .  She is complaining of increased depression and distress.  She admitted some time crying spells and irritability.  She endorse feeling of hopelessness and does not feel that her current medicine is working very well.  She is taking Tegretol and her level is low. Her recent BUN/creatinine is better and normal.  She was taking lithium however she has high creatinine and it was discontinued.  She is taking Tegretol for past few months.  Patient denies distressed level is gone up at work.  She feels very depressed and admitted more withdrawn and lack of energy.  She denies any suicidal thoughts or homicidal thoughts but denies sadness and depressed mood she is sleeping more than usual.  She denies any side effects of medication.  She denies any drinking or using any illegal substances.  Her vitals are okay.  Her appetite is okay.  She lives by herself.  She is working part-time job is stressful.  She has not gone to the gym on a regular basis.   Suicidal Ideation: No Plan Formed: No Patient has means to carry out plan: No  Homicidal Ideation: No Plan Formed: No Patient has means to carry out plan: No  Review of Systems  Constitutional: Positive for malaise/fatigue.  Skin: Negative for itching and rash.  Psychiatric/Behavioral: Positive for depression. The patient is nervous/anxious.    Psychiatric: Agitation: No Hallucination: No Depressed Mood: Yes Insomnia: No Hypersomnia: Yes Altered Concentration: No Feels Worthless: No Grandiose Ideas: No Belief In Special Powers: No New/Increased Substance Abuse: No Compulsions: No  Neurologic: Headache: Yes Seizure: No Paresthesias: No  Medical History:  Her primary care physician  is Penni Homans. Patient has a history of bladder problems, obesity and chronic back pain.  Psychosocial history. She is separated since 2005.  She is a single and lives alone.  Outpatient Encounter Prescriptions as of 12/19/2013  Medication Sig  . amitriptyline (ELAVIL) 50 MG tablet TAKE 1 TABLET BY MOUTH AT BEDTIME.  . carbamazepine (CARBATROL) 200 MG 12 hr capsule Take 1 capsule (200 mg total) by mouth 2 (two) times daily.  Marland Kitchen levothyroxine (SYNTHROID, LEVOTHROID) 100 MCG tablet TAKE 1 TABLET BY MOUTH DAILY BEFORE BREAKFAST.  Marland Kitchen PRESCRIPTION MEDICATION Apply topically as needed. Psoriasis    . ranitidine (ZANTAC) 300 MG tablet TAKE 1 TABLET (300 MG TOTAL) BY MOUTH AT BEDTIME.  Marland Kitchen risperiDONE (RISPERDAL) 2 MG tablet TAKE 1 TABLET BY MOUTH DAILY.  . [DISCONTINUED] amitriptyline (ELAVIL) 50 MG tablet TAKE 1 TABLET BY MOUTH AT BEDTIME.  . [DISCONTINUED] carbamazepine (CARBATROL) 100 MG 12 hr capsule TAKE 1 CAPSULE(100 MG TOTAL) BY MOUTH 2 (TWO) TIMES DAILY.  . [DISCONTINUED] risperiDONE (RISPERDAL) 2 MG tablet TAKE 1 TABLET BY MOUTH DAILY.   Recent Results (from the past 2160 hour(s))  Carbamazepine Level (Tegretol), total     Status: None   Collection Time: 11/07/13 12:00 AM  Result Value Ref Range   Carbamazepine Lvl 4.5 4.0 - 12.0 ug/mL  BASIC METABOLIC PANEL WITH GFR     Status: Abnormal   Collection Time: 11/07/13 12:00 AM  Result Value Ref Range   Sodium 142 135 - 145 mEq/L   Potassium 4.9 3.5 - 5.3 mEq/L   Chloride 108 96 - 112 mEq/L  CO2 26 19 - 32 mEq/L   Glucose, Bld 92 70 - 99 mg/dL   BUN 21 6 - 23 mg/dL   Creat 1.22 (H) 0.50 - 1.10 mg/dL   Calcium 9.7 8.4 - 10.5 mg/dL   GFR, Est African American 54 (L) mL/min   GFR, Est Non African American 47 (L) mL/min    Comment:   The estimated GFR is a calculation valid for adults (>=68 years old) that uses the CKD-EPI algorithm to adjust for age and sex. It is   not to be used for children, pregnant women, hospitalized patients,     patients on dialysis, or with rapidly changing kidney function. According to the NKDEP, eGFR >89 is normal, 60-89 shows mild impairment, 30-59 shows moderate impairment, 15-29 shows severe impairment and <15 is ESRD.    TSH     Status: None   Collection Time: 11/07/13  9:19 AM  Result Value Ref Range   TSH 0.71 0.35 - 4.50 uIU/mL  CBC w/Diff     Status: None   Collection Time: 11/07/13  9:19 AM  Result Value Ref Range   WBC 4.8 4.0 - 10.5 K/uL   RBC 4.54 3.87 - 5.11 Mil/uL   Hemoglobin 13.9 12.0 - 15.0 g/dL   HCT 41.6 36.0 - 46.0 %   MCV 91.7 78.0 - 100.0 fl   MCHC 33.3 30.0 - 36.0 g/dL   RDW 14.0 11.5 - 15.5 %   Platelets 222.0 150.0 - 400.0 K/uL   Neutrophils Relative % 61.1 43.0 - 77.0 %   Lymphocytes Relative 22.2 12.0 - 46.0 %   Monocytes Relative 11.8 3.0 - 12.0 %   Eosinophils Relative 4.3 0.0 - 5.0 %   Basophils Relative 0.6 0.0 - 3.0 %   Neutro Abs 3.0 1.4 - 7.7 K/uL   Lymphs Abs 1.1 0.7 - 4.0 K/uL   Monocytes Absolute 0.6 0.1 - 1.0 K/uL   Eosinophils Absolute 0.2 0.0 - 0.7 K/uL   Basophils Absolute 0.0 0.0 - 0.1 K/uL  Comp Met (CMET)     Status: Abnormal   Collection Time: 11/07/13  9:19 AM  Result Value Ref Range   Sodium 138 135 - 145 mEq/L   Potassium 4.5 3.5 - 5.1 mEq/L   Chloride 108 96 - 112 mEq/L   CO2 24 19 - 32 mEq/L   Glucose, Bld 91 70 - 99 mg/dL   BUN 22 6 - 23 mg/dL   Creatinine, Ser 1.2 0.4 - 1.2 mg/dL   Total Bilirubin 0.4 0.2 - 1.2 mg/dL   Alkaline Phosphatase 82 39 - 117 U/L   AST 19 0 - 37 U/L   ALT 26 0 - 35 U/L   Total Protein 7.0 6.0 - 8.3 g/dL   Albumin 4.2 3.5 - 5.2 g/dL   Calcium 9.4 8.4 - 10.5 mg/dL   GFR 48.67 (L) >60.00 mL/min   Past Psychiatric History/Hospitalization(s): Anxiety: No Bipolar Disorder: Yes Depression: Yes Mania: Yes Psychosis: No Schizophrenia: No Personality Disorder: No Hospitalization for psychiatric illness: Yes History of Electroconvulsive Shock Therapy: No Prior Suicide Attempts: No  Physical  Exam: Constitutional:  BP 135/61 mmHg  Pulse 85  Ht _0  (1.702 m)  Wt 200 lb 3.2 oz (90.81 kg)  BMI 31.35 kg/m2  General Appearance: alert, oriented, no acute distress, well nourished and obese  Musculoskeletal: Strength & Muscle Tone: within normal limits Gait & Station: normal Patient leans:  N/A  Mental status examination Patient is casually dressed and fairly groomed.  Her speech is fast but clear and coherent.  She describes her mood depressed and sad.  Her affect is constricted.  She denies any auditory or visual hallucination.  She denies any active or passive suicidal thoughts or homicidal thoughts.  There was no paranoia or any delusions.  Her attention and concentration is fair.  She has no tremors or shakes.  Her thought processes logical and goal-directed.  Her psychomotor activity is slightly increased.  Her fund of knowledge is good.  She is alert and oriented x3.  Her insight judgment and impulse control is okay.  Established Problem, Stable/Improving (1), Review of Psycho-Social Stressors (1), Review or order clinical lab tests (1), Established Problem, Worsening (2), Review of Last Therapy Session (1), Review of Medication Regimen & Side Effects (2) and Review of New Medication or Change in Dosage (2)  Assessment: Axis I: Bipolar disorder  Axis II: Deferred  Axis III: Obesity and hypothyroidism   Axis IV: Mild  Axis V: 65-70   Plan:  I review her blood work results, her Tegretol level is 4.5.  Her BUN/creatinine is normal.  I would increase Tegretol 200 mg twice a day.  Discussed medication side effects especially sedation in the beginning.  Continue amitriptyline and Risperdal at present dose.  Recommended to call us back if she has any question or any concern.  I will see her again in 4 weeks.  If patient does not improve we will consider a different mood stabilizer.  Time spent 25 minutes.  More than 50% of the time spent in psychoeducation, counseling and  coordination of care.  Discuss safety plan that anytime having active suicidal thoughts or homicidal thoughts then patient need to call 911 or go to the local emergency room.     ARFEEN,SYED T., MD 12/19/2013

## 2014-01-16 ENCOUNTER — Ambulatory Visit (HOSPITAL_COMMUNITY): Payer: Self-pay | Admitting: Psychiatry

## 2014-01-23 ENCOUNTER — Encounter (HOSPITAL_COMMUNITY): Payer: Self-pay | Admitting: Psychiatry

## 2014-01-23 ENCOUNTER — Ambulatory Visit (INDEPENDENT_AMBULATORY_CARE_PROVIDER_SITE_OTHER): Payer: 59 | Admitting: Psychiatry

## 2014-01-23 VITALS — BP 143/82 | HR 77 | Ht 67.0 in | Wt 197.8 lb

## 2014-01-23 DIAGNOSIS — F319 Bipolar disorder, unspecified: Secondary | ICD-10-CM

## 2014-01-23 MED ORDER — CARBAMAZEPINE ER 200 MG PO CP12: 200.0000 mg | ORAL_CAPSULE | Freq: Two times a day (BID) | ORAL | Status: DC

## 2014-01-23 MED ORDER — RISPERIDONE 2 MG PO TABS: ORAL_TABLET | ORAL | Status: DC

## 2014-01-23 MED ORDER — AMITRIPTYLINE HCL 50 MG PO TABS: ORAL_TABLET | ORAL | Status: DC

## 2014-01-23 NOTE — Progress Notes (Signed)
[facility] [id number] Progress Note  Margaret [name] [id number] 63 y.o.  01/23/2014 10:42 AM  Chief Complaint: I am feeling much better.   History of Present Illness: Margaret [name] came for her followup appointment .  She is taking Tegretol 200 mg twice a day.  She is feeling much better.  She denies any irritability, anger, crying spells.  On the last visit.  Increase the dose and she is tolerating very well.  She wants to continue to talk 200 mg twice a day.  Her sleep is improved.  She continued to have stress at work but overall she is handling very well.  She is not happy because she may work on Christmas.  She has no tremors or shakes.  Her appetite is good and she is happy that she lost weight.  Her vitals are stable.  Patient is working part-time .  She denies drinking or using any illegal substances.  She lives by herself.  Suicidal Ideation: No Plan Formed: No Patient has means to carry out plan: No  Homicidal Ideation: No Plan Formed: No Patient has means to carry out plan: No  ROS Psychiatric: Agitation: No Hallucination: No Depressed Mood: No Insomnia: No Hypersomnia: No Altered Concentration: No Feels Worthless: No Grandiose Ideas: No Belief In Special Powers: No New/Increased Substance Abuse: No Compulsions: No  Neurologic: Headache: No Seizure: No Paresthesias: No  Medical History:  Her primary care physician is Margaret [name]. Patient has a history of bladder problems, obesity and chronic back pain.  Outpatient Encounter Prescriptions as of 01/23/2014  Medication Sig  . amitriptyline (ELAVIL) 50 MG tablet TAKE 1 TABLET BY MOUTH AT BEDTIME.  . carbamazepine (CARBATROL) 200 MG 12 hr capsule Take 1 capsule (200 mg total) by mouth 2 (two) times daily.  . levothyroxine (SYNTHROID, LEVOTHROID) 100 MCG tablet TAKE 1 TABLET BY MOUTH DAILY BEFORE BREAKFAST.  . PRESCRIPTION MEDICATION Apply topically as needed. Psoriasis    . ranitidine (ZANTAC) 300 MG tablet TAKE 1  TABLET (300 MG TOTAL) BY MOUTH AT BEDTIME.  . risperiDONE (RISPERDAL) 2 MG tablet TAKE 1 TABLET BY MOUTH DAILY.  . [DISCONTINUED] amitriptyline (ELAVIL) 50 MG tablet TAKE 1 TABLET BY MOUTH AT BEDTIME.  . [DISCONTINUED] carbamazepine (CARBATROL) 200 MG 12 hr capsule Take 1 capsule (200 mg total) by mouth 2 (two) times daily.  . [DISCONTINUED] risperiDONE (RISPERDAL) 2 MG tablet TAKE 1 TABLET BY MOUTH DAILY.   Recent Results (from the past 2160 hour(s))  Carbamazepine Level (Tegretol), total     Status: None   Collection Time: 11/07/13 12:00 AM  Result Value Ref Range   Carbamazepine Lvl 4.5 4.0 - 12.0 ug/mL  BASIC METABOLIC PANEL WITH GFR     Status: Abnormal   Collection Time: 11/07/13 12:00 AM  Result Value Ref Range   Sodium 142 135 - 145 mEq/L   Potassium 4.9 3.5 - 5.3 mEq/L   Chloride 108 96 - 112 mEq/L   CO2 26 19 - 32 mEq/L   Glucose, Bld 92 70 - 99 mg/dL   BUN 21 6 - 23 mg/dL   Creat 1.22 (H) 0.50 - 1.10 mg/dL   Calcium 9.7 8.4 - 10.5 mg/dL   GFR, Est African American 54 (L) mL/min   GFR, Est Non African American 47 (L) mL/min    Comment:   The estimated GFR is a calculation valid for adults (>=18 years old) that uses the CKD-EPI algorithm to adjust for age and sex. It is   not to be   used for children, pregnant women, hospitalized patients,    patients on dialysis, or with rapidly changing kidney function. According to the NKDEP, eGFR >89 is normal, 60-89 shows mild impairment, 30-59 shows moderate impairment, 15-29 shows severe impairment and <15 is ESRD.    TSH     Status: None   Collection Time: 11/07/13  9:19 AM  Result Value Ref Range   TSH 0.71 0.35 - 4.50 uIU/mL  CBC w/Diff     Status: None   Collection Time: 11/07/13  9:19 AM  Result Value Ref Range   WBC 4.8 4.0 - 10.5 K/uL   RBC 4.54 3.87 - 5.11 Mil/uL   Hemoglobin 13.9 12.0 - 15.0 g/dL   HCT 41.6 36.0 - 46.0 %   MCV 91.7 78.0 - 100.0 fl   MCHC 33.3 30.0 - 36.0 g/dL   RDW 14.0 11.5 - 15.5 %   Platelets  222.0 150.0 - 400.0 K/uL   Neutrophils Relative % 61.1 43.0 - 77.0 %   Lymphocytes Relative 22.2 12.0 - 46.0 %   Monocytes Relative 11.8 3.0 - 12.0 %   Eosinophils Relative 4.3 0.0 - 5.0 %   Basophils Relative 0.6 0.0 - 3.0 %   Neutro Abs 3.0 1.4 - 7.7 K/uL   Lymphs Abs 1.1 0.7 - 4.0 K/uL   Monocytes Absolute 0.6 0.1 - 1.0 K/uL   Eosinophils Absolute 0.2 0.0 - 0.7 K/uL   Basophils Absolute 0.0 0.0 - 0.1 K/uL  Comp Met (CMET)     Status: Abnormal   Collection Time: 11/07/13  9:19 AM  Result Value Ref Range   Sodium 138 135 - 145 mEq/L   Potassium 4.5 3.5 - 5.1 mEq/L   Chloride 108 96 - 112 mEq/L   CO2 24 19 - 32 mEq/L   Glucose, Bld 91 70 - 99 mg/dL   BUN 22 6 - 23 mg/dL   Creatinine, Ser 1.2 0.4 - 1.2 mg/dL   Total Bilirubin 0.4 0.2 - 1.2 mg/dL   Alkaline Phosphatase 82 39 - 117 U/L   AST 19 0 - 37 U/L   ALT 26 0 - 35 U/L   Total Protein 7.0 6.0 - 8.3 g/dL   Albumin 4.2 3.5 - 5.2 g/dL   Calcium 9.4 8.4 - 10.5 mg/dL   GFR 48.67 (L) >60.00 mL/min   Past Psychiatric History/Hospitalization(s): Anxiety: No Bipolar Disorder: Yes Depression: Yes Mania: Yes Psychosis: No Schizophrenia: No Personality Disorder: No Hospitalization for psychiatric illness: Yes History of Electroconvulsive Shock Therapy: No Prior Suicide Attempts: No  Physical Exam: Constitutional:  BP 143/82 mmHg  Pulse 77  Ht 5' 7" (1.702 m)  Wt 197 lb 12.8 oz (89.721 kg)  BMI 30.97 kg/m2  General Appearance: alert, oriented, no acute distress, well nourished and obese  Musculoskeletal: Strength & Muscle Tone: within normal limits Gait & Station: normal Patient leans:  N/A  Mental status examination Patient is casually dressed and fairly groomed.  Her speech is fast but clear and coherent.  She describes her mood good and her affect is improved from the past.  She denies any auditory or visual hallucination.  She denies any active or passive suicidal thoughts or homicidal thoughts.  There was no  paranoia or any delusions.  Her attention and concentration is fair.  She has no tremors or shakes.  Her thought processes logical and goal-directed.  Her psychomotor activity is normal.  Her fund of knowledge is good.  She is alert and oriented x3.  Her insight   judgment and impulse control is okay.  Established Problem, Stable/Improving (1), Review of Last Therapy Session (1) and Review of Medication Regimen & Side Effects (2)  Assessment: Axis I: Bipolar disorder  Axis II: Deferred  Axis III: Obesity and hypothyroidism   Axis IV: Mild  Axis V: 65-70   Plan:  Patient is doing better on Tegretol 200 mg twice a day, amitriptyline 50 mg at bedtime and Risperdal 2 mg at bedtime.  She has no tremors or shakes.  Continue current medication we will consider Tegretol level on her next visit.  Follow-up in 3 months.  Patient is not interested in counseling.  Recommended to call us back if she has any question, concern or if she feels worsening of the symptoms.   Needham Biggins T., MD 01/23/2014

## 2014-02-08 ENCOUNTER — Ambulatory Visit (INDEPENDENT_AMBULATORY_CARE_PROVIDER_SITE_OTHER): Payer: 59 | Admitting: Physician Assistant

## 2014-02-08 ENCOUNTER — Ambulatory Visit: Payer: Self-pay | Admitting: Medical

## 2014-02-08 ENCOUNTER — Encounter: Payer: Self-pay | Admitting: Physician Assistant

## 2014-02-08 VITALS — BP 145/83 | HR 84 | Temp 98.4°F | Resp 16 | Ht 66.0 in | Wt 197.1 lb

## 2014-02-08 DIAGNOSIS — B029 Zoster without complications: Secondary | ICD-10-CM

## 2014-02-08 MED ORDER — VALACYCLOVIR HCL 1 G PO TABS: 1000.0000 mg | ORAL_TABLET | Freq: Three times a day (TID) | ORAL | Status: DC

## 2014-02-08 NOTE — Progress Notes (Signed)
Patient presents to clinic today c/o blistering, itchy and painful rash of right inguinal region first noted 5 days ago.  Patient denies hx of similar rash.  Does endorse history or chicken pox.  Denies change to detergents, lotions or hygiene products.  Past Medical History  Diagnosis Date  . Bipolar disorder   . PTSD (post-traumatic stress disorder)   . Thyroid disease   . Hypoglycemia   . Other malaise and fatigue 09/19/2012  . Hyperglycemia 09/19/2012  . Esophageal reflux 09/19/2012  . Cerumen impaction 12/26/2012  . Renal insufficiency 12/26/2012  . Diarrhea 12/26/2012  . Arthritis 02/14/2013    Current Outpatient Prescriptions on File Prior to Visit  Medication Sig Dispense Refill  . amitriptyline (ELAVIL) 50 MG tablet TAKE 1 TABLET BY MOUTH AT BEDTIME. 30 tablet 2  . carbamazepine (CARBATROL) 200 MG 12 hr capsule Take 1 capsule (200 mg total) by mouth 2 (two) times daily. 60 capsule 2  . levothyroxine (SYNTHROID, LEVOTHROID) 100 MCG tablet TAKE 1 TABLET BY MOUTH DAILY BEFORE BREAKFAST. 30 tablet 5  . PRESCRIPTION MEDICATION Apply topically as needed. Psoriasis      . risperiDONE (RISPERDAL) 2 MG tablet TAKE 1 TABLET BY MOUTH DAILY. 30 tablet 2   No current facility-administered medications on file prior to visit.    Allergies  Allergen Reactions  . Codeine Nausea Only  . Darvon     Hallucinations   . Sulfa Drugs Cross Reactors Nausea And Vomiting    Family History  Problem Relation Age of Onset  . Breast cancer Neg Hx   . Colon cancer Neg Hx   . Prostate cancer Father   . Diabetes Mother     Brother 1 of 2  . Heart failure Mother   . Hypertension Brother   . Esophageal cancer Maternal Grandmother   . Cancer Paternal Grandmother     cancer of jawbone 1 of 2  . Melanoma Brother   . Parkinsonism Father     deceased    History   Social History  . Marital Status: Divorced    Spouse Name: N/A    Number of Children: N/A  . Years of Education: N/A    Social History Main Topics  . Smoking status: Former Research scientist (life sciences)  . Smokeless tobacco: Never Used  . Alcohol Use: No  . Drug Use: No  . Sexual Activity: None   Other Topics Concern  . None   Social History Narrative   Review of Systems - See HPI.  All other ROS are negative.  BP 145/83 mmHg  Pulse 84  Temp(Src) 98.4 F (36.9 C) (Oral)  Resp 16  Ht 5\' 6"  (1.676 m)  Wt 197 lb 2 oz (89.415 kg)  BMI 31.83 kg/m2  SpO2 99%  Physical Exam  Constitutional: She is well-developed, well-nourished, and in no distress.  HENT:  Head: Normocephalic and atraumatic.  Eyes: Conjunctivae are normal. Pupils are equal, round, and reactive to light.  Neck: Neck supple.  Cardiovascular: Normal rate, regular rhythm, normal heart sounds and intact distal pulses.   Pulmonary/Chest: Effort normal. No respiratory distress. She has no wheezes. She has no rales. She exhibits no tenderness.  Skin: Skin is warm and dry.     Vitals reviewed.   No results found for this or any previous visit (from the past 2160 hour(s)).  Assessment/Plan: Shingles rash Discussed importance of early-initiation of antivirals to help with resolution of shingles rash. Now at day 5.  Unlikely antiviral will help resolve  rash but may still be beneficial in reducing change of post-herpetic neuralgia.  Will initiate therapy with Valtrex TID x 7 days. Pain medication offered but patient declines. Encouraged OTC pain medications for relied.  Patient instructed to keep rash covered until completely crusted over as the virus is contagious.  Follow-up PRN.

## 2014-02-08 NOTE — Patient Instructions (Signed)
Please take Valtrex as directed until all tablets are gone.  This will hopefully reduce the risk of complications, although at this point it will likely not help with the rash itself. Apply the lidocaine ointment to help numb the area.  Use OTC pain medications. Keep the rash covered as it is contagious until all lesions have scabbed over.  Shingles Shingles is caused by the same virus that causes chickenpox. The first feelings may be pain or tingling. A rash will follow in a couple days. The rash may occur on any area of the body. Long-lasting pain is more likely in an elderly person. It can last months to years. There are medicines that can help prevent pain if you start taking them early. HOME CARE   Take cool baths or place cool cloths on the rash as told by your doctor.  Take medicine only as told by your doctor.  Rest as told by your doctor.  Keep your rash clean with mild soap and cool water or as told by your doctor.  Do not scratch your rash. You may use calamine lotion to relieve itchy skin as told by your doctor.  Keep your rash covered with a loose bandage (dressing).  Avoid touching:  Babies.  Pregnant women.  Children with inflamed skin (eczema).  People who have gotten organ transplants.  People with chronic illnesses, such as leukemia or AIDS.  Wear loose-fitting clothing.  If the rash is on the face, you may need to see a specialist. Keep all appointments. Shingles must be kept away from the eyes, if possible.  Keep all follow-up visits as told by your doctor. GET HELP RIGHT AWAY IF:   You have any pain on the face or eye.  You lose feeling on one side of your face.  You have ear pain or ringing in your ear.  You cannot taste as well.  Your medicines do not help the pain.  Your redness or puffiness (swelling) spreads.  You feel like you are getting worse.  You have a fever. MAKE SURE YOU:   Understand these instructions.  Will watch your  condition.  Will get help right away if you are not doing well or get worse. Document Released: 07/16/2007 Document Revised: 06/13/2013 Document Reviewed: 07/16/2007 [facility] Patient Information 2015 [place], [place]. This information is not intended to replace advice given to you by your health care provider. Make sure you discuss any questions you have with your health care provider.

## 2014-02-08 NOTE — Progress Notes (Signed)
Pre visit review using our clinic review tool, if applicable. No additional management support is needed unless otherwise documented below in the visit note/SLS  

## 2014-02-12 NOTE — Assessment & Plan Note (Signed)
Discussed importance of early-initiation of antivirals to help with resolution of shingles rash. Now at day 5.  Unlikely antiviral will help resolve rash but may still be beneficial in reducing change of post-herpetic neuralgia.  Will initiate therapy with Valtrex TID x 7 days. Pain medication offered but patient declines. Encouraged OTC pain medications for relied.  Patient instructed to keep rash covered until completely crusted over as the virus is contagious.  Follow-up PRN.

## 2014-02-15 ENCOUNTER — Emergency Department (HOSPITAL_COMMUNITY)
Admission: EM | Admit: 2014-02-15 | Discharge: 2014-02-15 | Disposition: A | Discharge status: Home / Self Care | Payer: 59 | Attending: Emergency Medicine | Admitting: Emergency Medicine

## 2014-02-15 ENCOUNTER — Encounter (HOSPITAL_COMMUNITY): Payer: Self-pay | Admitting: Emergency Medicine

## 2014-02-15 ENCOUNTER — Emergency Department (HOSPITAL_COMMUNITY): Payer: 59

## 2014-02-15 ENCOUNTER — Telehealth: Payer: Self-pay | Admitting: Family Medicine

## 2014-02-15 DIAGNOSIS — E079 Disorder of thyroid, unspecified: Secondary | ICD-10-CM | POA: Insufficient documentation

## 2014-02-15 DIAGNOSIS — R109 Unspecified abdominal pain: Secondary | ICD-10-CM | POA: Diagnosis present

## 2014-02-15 DIAGNOSIS — M1990 Unspecified osteoarthritis, unspecified site: Secondary | ICD-10-CM | POA: Insufficient documentation

## 2014-02-15 DIAGNOSIS — Z79899 Other long term (current) drug therapy: Secondary | ICD-10-CM | POA: Insufficient documentation

## 2014-02-15 DIAGNOSIS — N23 Unspecified renal colic: Secondary | ICD-10-CM | POA: Diagnosis not present

## 2014-02-15 DIAGNOSIS — F319 Bipolar disorder, unspecified: Secondary | ICD-10-CM | POA: Diagnosis not present

## 2014-02-15 DIAGNOSIS — Z9851 Tubal ligation status: Secondary | ICD-10-CM | POA: Insufficient documentation

## 2014-02-15 DIAGNOSIS — Z8719 Personal history of other diseases of the digestive system: Secondary | ICD-10-CM | POA: Insufficient documentation

## 2014-02-15 DIAGNOSIS — R112 Nausea with vomiting, unspecified: Secondary | ICD-10-CM | POA: Insufficient documentation

## 2014-02-15 DIAGNOSIS — Z8669 Personal history of other diseases of the nervous system and sense organs: Secondary | ICD-10-CM | POA: Insufficient documentation

## 2014-02-15 DIAGNOSIS — F4310 Post-traumatic stress disorder, unspecified: Secondary | ICD-10-CM | POA: Diagnosis not present

## 2014-02-15 DIAGNOSIS — Z87891 Personal history of nicotine dependence: Secondary | ICD-10-CM | POA: Diagnosis not present

## 2014-02-15 LAB — URINE MICROSCOPIC-ADD ON

## 2014-02-15 LAB — CBC WITH DIFFERENTIAL/PLATELET
BASOS ABS: 0 10*3/uL (ref 0.0–0.1)
Basophils Relative: 0 % (ref 0–1)
Eosinophils Absolute: 0.1 10*3/uL (ref 0.0–0.7)
Eosinophils Relative: 1 % (ref 0–5)
HEMATOCRIT: 39.4 % (ref 36.0–46.0)
HEMOGLOBIN: 13.4 g/dL (ref 12.0–15.0)
LYMPHS ABS: 0.8 10*3/uL (ref 0.7–4.0)
LYMPHS PCT: 8 % — AB (ref 12–46)
MCH: 30.9 pg (ref 26.0–34.0)
MCHC: 34 g/dL (ref 30.0–36.0)
MCV: 91 fL (ref 78.0–100.0)
MONO ABS: 0.7 10*3/uL (ref 0.1–1.0)
MONOS PCT: 8 % (ref 3–12)
Neutro Abs: 7.5 10*3/uL (ref 1.7–7.7)
Neutrophils Relative %: 83 % — ABNORMAL HIGH (ref 43–77)
PLATELETS: 178 10*3/uL (ref 150–400)
RBC: 4.33 MIL/uL (ref 3.87–5.11)
RDW: 13.2 % (ref 11.5–15.5)
WBC: 9 10*3/uL (ref 4.0–10.5)

## 2014-02-15 LAB — URINALYSIS, ROUTINE W REFLEX MICROSCOPIC
Bilirubin Urine: NEGATIVE
GLUCOSE, UA: NEGATIVE mg/dL
Ketones, ur: NEGATIVE mg/dL
NITRITE: NEGATIVE
PH: 6 (ref 5.0–8.0)
Protein, ur: NEGATIVE mg/dL
Specific Gravity, Urine: 1.01 (ref 1.005–1.030)
UROBILINOGEN UA: 0.2 mg/dL (ref 0.0–1.0)

## 2014-02-15 LAB — LIPASE, BLOOD: Lipase: 31 U/L (ref 11–59)

## 2014-02-15 LAB — COMPREHENSIVE METABOLIC PANEL
ALBUMIN: 3.8 g/dL (ref 3.5–5.2)
ALT: 20 U/L (ref 0–35)
AST: 21 U/L (ref 0–37)
Alkaline Phosphatase: 81 U/L (ref 39–117)
Anion gap: 9 (ref 5–15)
BUN: 21 mg/dL (ref 6–23)
CHLORIDE: 108 meq/L (ref 96–112)
CO2: 20 mmol/L (ref 19–32)
CREATININE: 1.24 mg/dL — AB (ref 0.50–1.10)
Calcium: 8.9 mg/dL (ref 8.4–10.5)
GFR, EST AFRICAN AMERICAN: 52 mL/min — AB (ref >90)
GFR, EST NON AFRICAN AMERICAN: 45 mL/min — AB (ref >90)
Glucose, Bld: 163 mg/dL — ABNORMAL HIGH (ref 70–99)
Potassium: 4.1 mmol/L (ref 3.5–5.1)
SODIUM: 137 mmol/L (ref 135–145)
Total Bilirubin: 0.5 mg/dL (ref 0.3–1.2)
Total Protein: 5.9 g/dL — ABNORMAL LOW (ref 6.0–8.3)

## 2014-02-15 MED ORDER — ONDANSETRON HCL 4 MG/2ML IJ SOLN
4.0000 mg | Freq: Once | INTRAMUSCULAR | Status: AC
  Administered 2014-02-15: 4 mg via INTRAVENOUS

## 2014-02-15 MED ORDER — HYDROMORPHONE HCL 1 MG/ML IJ SOLN
0.5000 mg | Freq: Once | INTRAMUSCULAR | Status: AC
  Administered 2014-02-15: 0.5 mg via INTRAVENOUS
  Filled 2014-02-15: qty 1

## 2014-02-15 MED ORDER — DIPHENHYDRAMINE HCL 50 MG/ML IJ SOLN
25.0000 mg | Freq: Once | INTRAMUSCULAR | Status: DC
  Filled 2014-02-15: qty 1

## 2014-02-15 MED ORDER — OXYCODONE-ACETAMINOPHEN 5-325 MG PO TABS: 1.0000 | ORAL_TABLET | ORAL | Status: DC | PRN

## 2014-02-15 MED ORDER — METOCLOPRAMIDE HCL 10 MG PO TABS: 10.0000 mg | ORAL_TABLET | Freq: Four times a day (QID) | ORAL | Status: DC | PRN

## 2014-02-15 MED ORDER — SODIUM CHLORIDE 0.9 % IV SOLN: 1000.0000 mL | INTRAVENOUS | Status: DC

## 2014-02-15 MED ORDER — METOCLOPRAMIDE HCL 5 MG/ML IJ SOLN
10.0000 mg | Freq: Once | INTRAMUSCULAR | Status: AC
  Administered 2014-02-15: 10 mg via INTRAVENOUS
  Filled 2014-02-15: qty 2

## 2014-02-15 MED ORDER — HYDROMORPHONE HCL 1 MG/ML IJ SOLN
1.0000 mg | Freq: Once | INTRAMUSCULAR | Status: AC
  Administered 2014-02-15: 0.5 mg via INTRAVENOUS
  Filled 2014-02-15: qty 1

## 2014-02-15 MED ORDER — SODIUM CHLORIDE 0.9 % IV SOLN
1000.0000 mL | Freq: Once | INTRAVENOUS | Status: AC
  Administered 2014-02-15: 1000 mL via INTRAVENOUS

## 2014-02-15 NOTE — ED Notes (Signed)
[facility] adm 4mg  zofran, pt reports no relief in nausea. Pt actively vomiting upon arrival to department.

## 2014-02-15 NOTE — Telephone Encounter (Signed)
No kidney stones are not typically formed over a weeks time and are not even associated with this medication.  This issue was likely due to diet and metabolism.

## 2014-02-15 NOTE — Telephone Encounter (Signed)
LMOM with contact name and number RE: advice only per provider/SLS

## 2014-02-15 NOTE — ED Notes (Signed)
Patient transported to CT 

## 2014-02-15 NOTE — ED Provider Notes (Signed)
CSN: [id number]     Arrival date & time 02/15/14  0439 History   First MD Initiated Contact with Patient 02/15/14 0541     Chief Complaint  Patient presents with  . Emesis  . Abdominal Pain     (Consider location/radiation/quality/duration/timing/severity/associated sxs/prior Treatment) Patient is a 64 y.o. female presenting with vomiting and abdominal pain. The history is provided by the patient.  Emesis Associated symptoms: abdominal pain   Abdominal Pain Associated symptoms: vomiting   She was awakened at 1 AM with severe pain in the left flank which is radiated to the left lower abdomen. There is associated nausea and vomiting. She denies fever chills. Nothing makes the pain better nothing makes it worse. She came in by ambulance and was given ondansetron which has not helped the nausea. After arrival in the ED, she received a second dose of ondansetron with minimal relief. She's never had pain like this before. Of note, she currently is being treated for shingles in the right inguinal area and is on valacyclovir.  Past Medical History  Diagnosis Date  . Bipolar disorder   . PTSD (post-traumatic stress disorder)   . Thyroid disease   . Hypoglycemia   . Other malaise and fatigue 09/19/2012  . Hyperglycemia 09/19/2012  . Esophageal reflux 09/19/2012  . Cerumen impaction 12/26/2012  . Renal insufficiency 12/26/2012  . Diarrhea 12/26/2012  . Arthritis 02/14/2013   Past Surgical History  Procedure Laterality Date  . Tubal ligation      reversal   Family History  Problem Relation Age of Onset  . Breast cancer Neg Hx   . Colon cancer Neg Hx   . Prostate cancer Father   . Diabetes Mother     Brother 1 of 2  . Heart failure Mother   . Hypertension Brother   . Esophageal cancer Maternal Grandmother   . Cancer Paternal Grandmother     cancer of jawbone 1 of 2  . Melanoma Brother   . Parkinsonism Father     deceased   History  Substance Use Topics  . Smoking status: Former  Research scientist (life sciences)  . Smokeless tobacco: Never Used  . Alcohol Use: No   OB History    No data available     Review of Systems  Gastrointestinal: Positive for vomiting and abdominal pain.  All other systems reviewed and are negative.     Allergies  Codeine; Darvon; and Sulfa drugs cross reactors  Home Medications   Prior to Admission medications   Medication Sig Start Date End Date Taking? Authorizing Provider  amitriptyline (ELAVIL) 50 MG tablet TAKE 1 TABLET BY MOUTH AT BEDTIME. 01/23/14  Yes [name], MD  carbamazepine (CARBATROL) 200 MG 12 hr capsule Take 1 capsule (200 mg total) by mouth 2 (two) times daily. 01/23/14  Yes [name], MD  levothyroxine (SYNTHROID, LEVOTHROID) 100 MCG tablet TAKE 1 TABLET BY MOUTH DAILY BEFORE BREAKFAST. 09/12/13  Yes Mosie Lukes, MD  risperiDONE (RISPERDAL) 2 MG tablet TAKE 1 TABLET BY MOUTH DAILY. 01/23/14  Yes [name], MD  PRESCRIPTION MEDICATION Apply topically as needed. Psoriasis      Historical Provider, MD  valACYclovir (VALTREX) 1000 MG tablet Take 1 tablet (1,000 mg total) by mouth 3 (three) times daily. 02/08/14   Brunetta Jeans, PA-C   BP 147/53 mmHg  Pulse 84  Temp(Src) 98.5 F (36.9 C)  Resp 17  Ht 5\' 7"  (1.702 m)  Wt 185 lb (83.915 kg)  BMI 28.97  kg/m2  SpO2 96% Physical Exam  Nursing note and vitals reviewed.  64 year old female, resting comfortably and in no acute distress. Vital signs are significant for mild hypertension. Oxygen saturation is 96%, which is normal. Head is normocephalic and atraumatic. PERRLA, EOMI. Oropharynx is clear. Neck is nontender and supple without adenopathy or JVD. Back is nontender and there is no CVA tenderness. Lungs are clear without rales, wheezes, or rhonchi. Chest is nontender. Heart has regular rate and rhythm without murmur. Abdomen is soft, flat, with moderate tenderness in the left mid and lower abdomen. There is no rebound or guarding. There are no masses or  hepatosplenomegaly and peristalsis is normoactive. Extremities have no cyanosis or edema, full range of motion is present. Skin is warm and dry without rash. Neurologic: Mental status is normal, cranial nerves are intact, there are no motor or sensory deficits.  ED Course  Procedures (including critical care time) Labs Review Results for orders placed or performed during the hospital encounter of 02/15/14  CBC with Differential  Result Value Ref Range   WBC 9.0 4.0 - 10.5 K/uL   RBC 4.33 3.87 - 5.11 MIL/uL   Hemoglobin 13.4 12.0 - 15.0 g/dL   HCT 39.4 36.0 - 46.0 %   MCV 91.0 78.0 - 100.0 fL   MCH 30.9 26.0 - 34.0 pg   MCHC 34.0 30.0 - 36.0 g/dL   RDW 13.2 11.5 - 15.5 %   Platelets 178 150 - 400 K/uL   Neutrophils Relative % 83 (H) 43 - 77 %   Neutro Abs 7.5 1.7 - 7.7 K/uL   Lymphocytes Relative 8 (L) 12 - 46 %   Lymphs Abs 0.8 0.7 - 4.0 K/uL   Monocytes Relative 8 3 - 12 %   Monocytes Absolute 0.7 0.1 - 1.0 K/uL   Eosinophils Relative 1 0 - 5 %   Eosinophils Absolute 0.1 0.0 - 0.7 K/uL   Basophils Relative 0 0 - 1 %   Basophils Absolute 0.0 0.0 - 0.1 K/uL  Comprehensive metabolic panel  Result Value Ref Range   Sodium 137 135 - 145 mmol/L   Potassium 4.1 3.5 - 5.1 mmol/L   Chloride 108 96 - 112 mEq/L   CO2 20 19 - 32 mmol/L   Glucose, Bld 163 (H) 70 - 99 mg/dL   BUN 21 6 - 23 mg/dL   Creatinine, Ser 1.24 (H) 0.50 - 1.10 mg/dL   Calcium 8.9 8.4 - 10.5 mg/dL   Total Protein 5.9 (L) 6.0 - 8.3 g/dL   Albumin 3.8 3.5 - 5.2 g/dL   AST 21 0 - 37 U/L   ALT 20 0 - 35 U/L   Alkaline Phosphatase 81 39 - 117 U/L   Total Bilirubin 0.5 0.3 - 1.2 mg/dL   GFR calc non Af Amer 45 (L) >90 mL/min   GFR calc Af Amer 52 (L) >90 mL/min   Anion gap 9 5 - 15  Lipase, blood  Result Value Ref Range   Lipase 31 11 - 59 U/L   Imaging Review Ct Renal Stone Study  02/15/2014   CLINICAL DATA:  Sudden onset left lower quadrant and left flank pain waking patient from sleep at 0300 hr today.   EXAM: CT ABDOMEN AND PELVIS WITHOUT CONTRAST  TECHNIQUE: Multidetector CT imaging of the abdomen and pelvis was performed following the standard protocol without IV contrast.  COMPARISON:  None.  FINDINGS: Dependent atelectasis in the lung bases.  There is marked left renal  hydronephrosis with decompression of the ureter. There is extensive pararenal infiltration. A definite obstructing stone is not identified. Obstruction may be caused by a ureteropelvic junction stricture or non radiopaque stone. This could also represent sequela of a recently passed stone. Additional calcifications are demonstrated within both kidneys suggesting nonobstructing stones although the calcifications are somewhat poorly defined possibly indicating nonspecific nephrocalcinosis. Calcification in the right lower pole measures 6 mm and in the left lower pole measures 6 mm. Right kidney demonstrates no hydronephrosis or ureterectasis. The bladder wall is not thickened and no bladder stones are identified. There is a rounded calcification inferior to the bladder along the midline at the level of the symphysis pubis. This measures about 6 mm in diameter. This could represent a stone in the urethra. Calcified phleboliths are present.  The unenhanced appearance of the liver, spleen, gallbladder, pancreas, adrenal glands, abdominal aorta, inferior vena cava, and retroperitoneal lymph nodes are unremarkable. Stomach, small bowel, and colon are mostly decompressed. No free air or free fluid in the abdomen.  Pelvis: The appendix is normal. Uterus and ovaries are not enlarged. No free or loculated pelvic fluid collections. No pelvic mass or lymphadenopathy. Normal alignment of the lumbar spine. No destructive bone lesions are identified.  IMPRESSION: Marked left renal hydronephrosis and perirenal infiltration with decompression of the ureter. No obstructing stone is identified. Changes could be due to UPJ stricture or non radiopaque stone versus  recently passed stone. Possible stone demonstrated over the urethra. Bilateral nonobstructing intrarenal calcifications.   Electronically Signed   By: Lucienne Capers M.D.   On: 02/15/2014 06:55   Images viewed by me.   MDM   Final diagnoses:  Left flank pain  Renal colic    Flank and abdominal pain suspicious for renal colic. Old records are reviewed and she has no prior imaging of that area. She'll be given IV fluids, hydromorphone for pain, metoclopramide for nausea and will be sent for CT scan to evaluate for possible ureterolithiasis.  Patient had good relief of pain with hydromorphone in good relief of nausea with metoclopramide. CT shows marked hydronephrosis on the left but no stone visible and ureter are decompressed. This is felt to be most likely due to a recently passed stone. Picture could be seen with a radiolucent stone, but patient's marked improvement seems more consistent with recently passed stone. She is discharged with prescriptions for oxycodone-acetaminophen and metoclopramide and is referred to urology for follow-up.  Delora Fuel, MD 47/09/29 [id number]

## 2014-02-15 NOTE — ED Notes (Signed)
MD at bedside. 

## 2014-02-15 NOTE — Telephone Encounter (Signed)
Caller name: tametha Relation to pt: self Call back number: [phone] Pharmacy:  Reason for call:   Patient was seen last week for shingles and was given medication. She states that she was seen in the ED earlier for kidney stones. She wants to know if this medication could have caused this? Saw Cody for this.

## 2014-02-15 NOTE — ED Notes (Signed)
Pt is from home, pt presents with lower left abdominal pain that radiates to her left flank with associated N/V. Pt denies difficulty voiding. A&OX4. Pt reports sx woke her up from her sleep at 0200 this morning.

## 2014-02-15 NOTE — Discharge Instructions (Signed)
Kidney Stones Kidney stones (urolithiasis) are deposits that form inside your kidneys. The intense pain is caused by the stone moving through the urinary tract. When the stone moves, the ureter goes into spasm around the stone. The stone is usually passed in the urine.  CAUSES   A disorder that makes certain neck glands produce too much parathyroid hormone (primary hyperparathyroidism).  A buildup of uric acid crystals, similar to gout in your joints.  Narrowing (stricture) of the ureter.  A kidney obstruction present at birth (congenital obstruction).  Previous surgery on the kidney or ureters.  Numerous kidney infections. SYMPTOMS   Feeling sick to your stomach (nauseous).  Throwing up (vomiting).  Blood in the urine (hematuria).  Pain that usually spreads (radiates) to the groin.  Frequency or urgency of urination. DIAGNOSIS   Taking a history and physical exam.  Blood or urine tests.  CT scan.  Occasionally, an examination of the inside of the urinary bladder (cystoscopy) is performed. TREATMENT   Observation.  Increasing your fluid intake.  Extracorporeal shock wave lithotripsy--This is a noninvasive procedure that uses shock waves to break up kidney stones.  Surgery may be needed if you have severe pain or persistent obstruction. There are various surgical procedures. Most of the procedures are performed with the use of small instruments. Only small incisions are needed to accommodate these instruments, so recovery time is minimized. The size, location, and chemical composition are all important variables that will determine the proper choice of action for you. Talk to your health care provider to better understand your situation so that you will minimize the risk of injury to yourself and your kidney.  HOME CARE INSTRUCTIONS   Drink enough water and fluids to keep your urine clear or pale yellow. This will help you to pass the stone or stone fragments.  Strain  all urine through the provided strainer. Keep all particulate matter and stones for your health care provider to see. The stone causing the pain may be as small as a grain of salt. It is very important to use the strainer each and every time you pass your urine. The collection of your stone will allow your health care provider to analyze it and verify that a stone has actually passed. The stone analysis will often identify what you can do to reduce the incidence of recurrences.  Only take over-the-counter or prescription medicines for pain, discomfort, or fever as directed by your health care provider.  Make a follow-up appointment with your health care provider as directed.  Get follow-up X-rays if required. The absence of pain does not always mean that the stone has passed. It may have only stopped moving. If the urine remains completely obstructed, it can cause loss of kidney function or even complete destruction of the kidney. It is your responsibility to make sure X-rays and follow-ups are completed. Ultrasounds of the kidney can show blockages and the status of the kidney. Ultrasounds are not associated with any radiation and can be performed easily in a matter of minutes. SEEK MEDICAL CARE IF:  You experience pain that is progressive and unresponsive to any pain medicine you have been prescribed. SEEK IMMEDIATE MEDICAL CARE IF:   Pain cannot be controlled with the prescribed medicine.  You have a fever or shaking chills.  The severity or intensity of pain increases over 18 hours and is not relieved by pain medicine.  You develop a new onset of abdominal pain.  You feel faint or pass out.  You are unable to urinate. MAKE SURE YOU:   Understand these instructions.  Will watch your condition.  Will get help right away if you are not doing well or get worse. Document Released: 01/27/2005 Document Revised: 09/29/2012 Document Reviewed: 06/30/2012 [facility] Patient Information 2015  [place], [place]. This information is not intended to replace advice given to you by your health care provider. Make sure you discuss any questions you have with your health care provider.  Acetaminophen; Oxycodone tablets What is this medicine? ACETAMINOPHEN; OXYCODONE (a set a MEE noe fen; ox i KOE done) is a pain reliever. It is used to treat mild to moderate pain. This medicine may be used for other purposes; ask your health care provider or pharmacist if you have questions. COMMON BRAND NAME(S): Endocet, Magnacet, Narvox, Percocet, Perloxx, Primalev, Primlev, Roxicet, Xolox What should I tell my health care provider before I take this medicine? They need to know if you have any of these conditions: -brain tumor -Crohn's disease, inflammatory bowel disease, or ulcerative colitis -drug abuse or addiction -head injury -heart or circulation problems -if you often drink alcohol -kidney disease or problems going to the bathroom -liver disease -lung disease, asthma, or breathing problems -an unusual or allergic reaction to acetaminophen, oxycodone, other opioid analgesics, other medicines, foods, dyes, or preservatives -pregnant or trying to get pregnant -breast-feeding How should I use this medicine? Take this medicine by mouth with a full glass of water. Follow the directions on the prescription label. Take your medicine at regular intervals. Do not take your medicine more often than directed. Talk to your pediatrician regarding the use of this medicine in children. Special care may be needed. Patients over 45 years old may have a stronger reaction and need a smaller dose. Overdosage: If you think you have taken too much of this medicine contact a poison control center or emergency room at once. NOTE: This medicine is only for you. Do not share this medicine with others. What if I miss a dose? If you miss a dose, take it as soon as you can. If it is almost time for your next dose, take only  that dose. Do not take double or extra doses. What may interact with this medicine? -alcohol -antihistamines -barbiturates like amobarbital, butalbital, butabarbital, methohexital, pentobarbital, phenobarbital, thiopental, and secobarbital -benztropine -drugs for bladder problems like solifenacin, trospium, oxybutynin, tolterodine, hyoscyamine, and methscopolamine -drugs for breathing problems like ipratropium and tiotropium -drugs for certain stomach or intestine problems like propantheline, homatropine methylbromide, glycopyrrolate, atropine, belladonna, and dicyclomine -general anesthetics like etomidate, ketamine, nitrous oxide, propofol, desflurane, enflurane, halothane, isoflurane, and sevoflurane -medicines for depression, anxiety, or psychotic disturbances -medicines for sleep -muscle relaxants -naltrexone -narcotic medicines (opiates) for pain -phenothiazines like perphenazine, thioridazine, chlorpromazine, mesoridazine, fluphenazine, prochlorperazine, promazine, and trifluoperazine -scopolamine -tramadol -trihexyphenidyl This list may not describe all possible interactions. Give your health care provider a list of all the medicines, herbs, non-prescription drugs, or dietary supplements you use. Also tell them if you smoke, drink alcohol, or use illegal drugs. Some items may interact with your medicine. What should I watch for while using this medicine? Tell your doctor or health care professional if your pain does not go away, if it gets worse, or if you have new or a different type of pain. You may develop tolerance to the medicine. Tolerance means that you will need a higher dose of the medication for pain relief. Tolerance is normal and is expected if you take this medicine for a long time.  Do not suddenly stop taking your medicine because you may develop a severe reaction. Your body becomes used to the medicine. This does NOT mean you are addicted. Addiction is a behavior related  to getting and using a drug for a non-medical reason. If you have pain, you have a medical reason to take pain medicine. Your doctor will tell you how much medicine to take. If your doctor wants you to stop the medicine, the dose will be slowly lowered over time to avoid any side effects. You may get drowsy or dizzy. Do not drive, use machinery, or do anything that needs mental alertness until you know how this medicine affects you. Do not stand or sit up quickly, especially if you are an older patient. This reduces the risk of dizzy or fainting spells. Alcohol may interfere with the effect of this medicine. Avoid alcoholic drinks. There are different types of narcotic medicines (opiates) for pain. If you take more than one type at the same time, you may have more side effects. Give your health care provider a list of all medicines you use. Your doctor will tell you how much medicine to take. Do not take more medicine than directed. Call emergency for help if you have problems breathing. The medicine will cause constipation. Try to have a bowel movement at least every 2 to 3 days. If you do not have a bowel movement for 3 days, call your doctor or health care professional. Do not take Tylenol (acetaminophen) or medicines that have acetaminophen with this medicine. Too much acetaminophen can be very dangerous. Many nonprescription medicines contain acetaminophen. Always read the labels carefully to avoid taking more acetaminophen. What side effects may I notice from receiving this medicine? Side effects that you should report to your doctor or health care professional as soon as possible: -allergic reactions like skin rash, itching or hives, swelling of the face, lips, or tongue -breathing difficulties, wheezing -confusion -light headedness or fainting spells -severe stomach pain -unusually weak or tired -yellowing of the skin or the whites of the eyes Side effects that usually do not require medical  attention (report to your doctor or health care professional if they continue or are bothersome): -dizziness -drowsiness -nausea -vomiting This list may not describe all possible side effects. Call your doctor for medical advice about side effects. You may report side effects to FDA at 1-800-FDA-1088. Where should I keep my medicine? Keep out of the reach of children. This medicine can be abused. Keep your medicine in a safe place to protect it from theft. Do not share this medicine with anyone. Selling or giving away this medicine is dangerous and against the law. Store at room temperature between 20 and 25 degrees C (68 and 77 degrees F). Keep container tightly closed. Protect from light. This medicine may cause accidental overdose and death if it is taken by other adults, children, or pets. Flush any unused medicine down the toilet to reduce the chance of harm. Do not use the medicine after the expiration date. NOTE: This sheet is a summary. It may not cover all possible information. If you have questions about this medicine, talk to your doctor, pharmacist, or health care provider.  2015, Elsevier/Gold Standard. (2012-09-20 13:17:35)  Metoclopramide tablets What is this medicine? METOCLOPRAMIDE (met oh kloe PRA mide) is used to treat the symptoms of gastroesophageal reflux disease (GERD) like heartburn. It is also used to treat people with slow emptying of the stomach and intestinal tract. This medicine may  be used for other purposes; ask your health care provider or pharmacist if you have questions. COMMON BRAND NAME(S): Reglan What should I tell my health care provider before I take this medicine? They need to know if you have any of these conditions: -breast cancer -depression -diabetes -heart failure -high blood pressure -kidney disease -liver disease -Parkinson's disease or a movement disorder -pheochromocytoma -seizures -stomach obstruction, bleeding, or perforation -an  unusual or allergic reaction to metoclopramide, procainamide, sulfites, other medicines, foods, dyes, or preservatives -pregnant or trying to get pregnant -breast-feeding How should I use this medicine? Take this medicine by mouth with a glass of water. Follow the directions on the prescription label. Take this medicine on an empty stomach, about 30 minutes before eating. Take your doses at regular intervals. Do not take your medicine more often than directed. Do not stop taking except on the advice of your doctor or health care professional. A special MedGuide will be given to you by the pharmacist with each prescription and refill. Be sure to read this information carefully each time. Talk to your pediatrician regarding the use of this medicine in children. Special care may be needed. Overdosage: If you think you have taken too much of this medicine contact a poison control center or emergency room at once. NOTE: This medicine is only for you. Do not share this medicine with others. What if I miss a dose? If you miss a dose, take it as soon as you can. If it is almost time for your next dose, take only that dose. Do not take double or extra doses. What may interact with this medicine? -acetaminophen -cyclosporine -digoxin -medicines for blood pressure -medicines for diabetes, including insulin -medicines for hay fever and other allergies -medicines for depression, especially an Monoamine Oxidase Inhibitor (MAOI) -medicines for Parkinson's disease, like levodopa -medicines for sleep or for pain -tetracycline This list may not describe all possible interactions. Give your health care provider a list of all the medicines, herbs, non-prescription drugs, or dietary supplements you use. Also tell them if you smoke, drink alcohol, or use illegal drugs. Some items may interact with your medicine. What should I watch for while using this medicine? It may take a few weeks for your stomach condition  to start to get better. However, do not take this medicine for longer than 12 weeks. The longer you take this medicine, and the more you take it, the greater your chances are of developing serious side effects. If you are an elderly patient, a female patient, or you have diabetes, you may be at an increased risk for side effects from this medicine. Contact your doctor immediately if you start having movements you cannot control such as lip smacking, rapid movements of the tongue, involuntary or uncontrollable movements of the eyes, head, arms and legs, or muscle twitches and spasms. Patients and their families should watch out for worsening depression or thoughts of suicide. Also watch out for any sudden or severe changes in feelings such as feeling anxious, agitated, panicky, irritable, hostile, aggressive, impulsive, severely restless, overly excited and hyperactive, or not being able to sleep. If this happens, especially at the beginning of treatment or after a change in dose, call your doctor. Do not treat yourself for high fever. Ask your doctor or health care professional for advice. You may get drowsy or dizzy. Do not drive, use machinery, or do anything that needs mental alertness until you know how this drug affects you. Do not stand or sit  up quickly, especially if you are an older patient. This reduces the risk of dizzy or fainting spells. Alcohol can make you more drowsy and dizzy. Avoid alcoholic drinks. What side effects may I notice from receiving this medicine? Side effects that you should report to your doctor or health care professional as soon as possible: -allergic reactions like skin rash, itching or hives, swelling of the face, lips, or tongue -abnormal production of milk in females -breast enlargement in both males and females -change in the way you walk -difficulty moving, speaking or swallowing -drooling, lip smacking, or rapid movements of the tongue -excessive  sweating -fever -involuntary or uncontrollable movements of the eyes, head, arms and legs -irregular heartbeat or palpitations -muscle twitches and spasms -unusually weak or tired Side effects that usually do not require medical attention (report to your doctor or health care professional if they continue or are bothersome): -change in sex drive or performance -depressed mood -diarrhea -difficulty sleeping -headache -menstrual changes -restless or nervous This list may not describe all possible side effects. Call your doctor for medical advice about side effects. You may report side effects to FDA at 1-800-FDA-1088. Where should I keep my medicine? Keep out of the reach of children. Store at room temperature between 20 and 25 degrees C (68 and 77 degrees F). Protect from light. Keep container tightly closed. Throw away any unused medicine after the expiration date. NOTE: This sheet is a summary. It may not cover all possible information. If you have questions about this medicine, talk to your doctor, pharmacist, or health care provider.  2015, Elsevier/Gold Standard. (2011-05-27 13:04:38)

## 2014-03-10 ENCOUNTER — Other Ambulatory Visit: Payer: Self-pay | Admitting: Family Medicine

## 2014-04-11 ENCOUNTER — Encounter: Payer: Self-pay | Admitting: Primary Care

## 2014-04-11 ENCOUNTER — Telehealth: Payer: Self-pay | Admitting: Family Medicine

## 2014-04-11 ENCOUNTER — Ambulatory Visit (HOSPITAL_BASED_OUTPATIENT_CLINIC_OR_DEPARTMENT_OTHER)
Admission: RE | Admit: 2014-04-11 | Discharge: 2014-04-11 | Disposition: A | Discharge status: Home / Self Care | Payer: 59 | Source: Ambulatory Visit | Attending: Primary Care | Admitting: Primary Care

## 2014-04-11 ENCOUNTER — Ambulatory Visit (INDEPENDENT_AMBULATORY_CARE_PROVIDER_SITE_OTHER): Payer: 59 | Admitting: Primary Care

## 2014-04-11 VITALS — BP 125/56 | HR 86 | Temp 98.1°F | Resp 16 | Wt 187.8 lb

## 2014-04-11 DIAGNOSIS — S82442A Displaced spiral fracture of shaft of left fibula, initial encounter for closed fracture: Secondary | ICD-10-CM

## 2014-04-11 DIAGNOSIS — S99912A Unspecified injury of left ankle, initial encounter: Secondary | ICD-10-CM

## 2014-04-11 DIAGNOSIS — X58XXXA Exposure to other specified factors, initial encounter: Secondary | ICD-10-CM | POA: Insufficient documentation

## 2014-04-11 NOTE — Telephone Encounter (Signed)
Caller name: Nadezhda Relation to pt: self Call back number: Pharmacy:  Reason for call:   Patient requesting a callback on her work phone when [employer] results come in.

## 2014-04-11 NOTE — Progress Notes (Addendum)
Subjective:    Patient ID: Margaret [name], female    DOB: 1950/04/12, 64 y.o.   MRN: [id number]  HPI  Margaret [name] is a 64 year old female who presents today with a chief complaint of left ankle pain since yesterday afternoon after a fall. She  tripped over one of her dog gates and states "my foot just didn't clear when I stepped over". She denies loss of consciousness, and did "graze" her head against the wall but does not have headaches. She has taken ibuprofen without relief and has applied ice to her ankle which resulted in decreased swelling. She was able to bear weight immediately after incident but does report pain during ambulation, 0/10 at rest and 10/10 with walking. Walking makes pain worse. No previous injury to left ankle. She denies frequent falls and reports falling once in the past year.   Review of Systems  Respiratory: Negative for shortness of breath.   Cardiovascular: Positive for leg swelling. Negative for chest pain.       Moderate swelling to left ankle involving lateral malleolus and anterior foot  Musculoskeletal: Positive for myalgias.  Skin: Positive for color change and wound.       Redness to left ankle Abrasion to left tibial tuberosity  Neurological: Negative for dizziness, light-headedness and headaches.   Past Medical History  Diagnosis Date  . Bipolar disorder   . PTSD (post-traumatic stress disorder)   . Thyroid disease   . Hypoglycemia   . Other malaise and fatigue 09/19/2012  . Hyperglycemia 09/19/2012  . Esophageal reflux 09/19/2012  . Cerumen impaction 12/26/2012  . Renal insufficiency 12/26/2012  . Diarrhea 12/26/2012  . Arthritis 02/14/2013    History   Social History  . Marital Status: Divorced    Spouse Name: N/A  . Number of Children: N/A  . Years of Education: N/A   Occupational History  . Not on file.   Social History Main Topics  . Smoking status: Former Research scientist (life sciences)  . Smokeless tobacco: Never Used  . Alcohol Use: No  . Drug Use: No  .  Sexual Activity: Not on file   Other Topics Concern  . Not on file   Social History Narrative    Past Surgical History  Procedure Laterality Date  . Tubal ligation      reversal    Family History  Problem Relation Age of Onset  . Breast cancer Neg Hx   . Colon cancer Neg Hx   . Prostate cancer Father   . Diabetes Mother     Brother 1 of 2  . Heart failure Mother   . Hypertension Brother   . Esophageal cancer Maternal Grandmother   . Cancer Paternal Grandmother     cancer of jawbone 1 of 2  . Melanoma Brother   . Parkinsonism Father     deceased    Allergies  Allergen Reactions  . Codeine Nausea Only  . Darvon     Hallucinations   . Sulfa Drugs Cross Reactors Nausea And Vomiting    Current Outpatient Prescriptions on File Prior to Visit  Medication Sig Dispense Refill  . amitriptyline (ELAVIL) 50 MG tablet TAKE 1 TABLET BY MOUTH AT BEDTIME. 30 tablet 2  . carbamazepine (CARBATROL) 200 MG 12 hr capsule Take 1 capsule (200 mg total) by mouth 2 (two) times daily. 60 capsule 2  . levothyroxine (SYNTHROID, LEVOTHROID) 100 MCG tablet TAKE 1 TABLET BY MOUTH DAILY BEFORE BREAKFAST. 30 tablet 5  . risperiDONE (RISPERDAL)  2 MG tablet TAKE 1 TABLET BY MOUTH DAILY. 30 tablet 2  . metoCLOPramide (REGLAN) 10 MG tablet Take 1 tablet (10 mg total) by mouth every 6 (six) hours as needed for nausea. (Patient not taking: Reported on 04/11/2014) 30 tablet 0  . oxyCODONE-acetaminophen (PERCOCET) 5-325 MG per tablet Take 1 tablet by mouth every 4 (four) hours as needed for moderate pain. (Patient not taking: Reported on 04/11/2014) 20 tablet 0   No current facility-administered medications on file prior to visit.    BP 125/56 mmHg  Pulse 86  Temp(Src) 98.1 F (36.7 C) (Oral)  Resp 16  Wt 187 lb 12.8 oz (85.186 kg)  SpO2 100%       Objective:   Physical Exam  Constitutional: She is oriented to person, place, and time. She appears well-developed.  HENT:  Head: Normocephalic.    Eyes: EOM are normal. Pupils are equal, round, and reactive to light.  Cardiovascular: Normal rate.   Pulmonary/Chest: Effort normal and breath sounds normal.  Musculoskeletal: She exhibits edema and tenderness.  Limited plantar and dorsal flexion to left ankle. Swelling present to left malleolus and anterior foot.   Neurological: She is alert and oriented to person, place, and time.  Skin: Skin is warm and dry.  Small abrasion to surface of tibial tuberosity. Denies pain.          Assessment & Plan:  Suspect this is an ankle sprain without fracture. Xray ordered due to moderate swelling and reports of 10/10 pain during ambulation. Home care instructions provided for rest, ice, compression, and elevation of extremity. Ibuprofen as needed for pain and inflammation.  Addendum: Xray results show: Spiral oblique fracture of the distal left fibula. Referral placed for ortho. Called patient and notified her of results and that she would be contacted by ortho for an appointment.

## 2014-04-11 NOTE — Telephone Encounter (Signed)
error 

## 2014-04-11 NOTE — Addendum Note (Signed)
Addended by: Pleas Koch on: 04/11/2014 01:45 PM   Modules accepted: Orders

## 2014-04-11 NOTE — Progress Notes (Signed)
Pre visit review using our clinic review tool, if applicable. No additional management support is needed unless otherwise documented below in the visit note. 

## 2014-04-11 NOTE — Patient Instructions (Addendum)
Complete xray on first floor prior to leaving. You may continue to take ibuprofen as directed. Ensure to follow home care instructions listed below. We will notify you of your xray results. We hope you feel better soon!  Ankle Sprain An ankle sprain is an injury to the strong, fibrous tissues (ligaments) that hold the bones of your ankle joint together.  CAUSES An ankle sprain is usually caused by a fall or by twisting your ankle. Ankle sprains most commonly occur when you step on the outer edge of your foot, and your ankle turns inward. People who participate in sports are more prone to these types of injuries.  SYMPTOMS   Pain in your ankle. The pain may be present at rest or only when you are trying to stand or walk.  Swelling.  Bruising. Bruising may develop immediately or within 1 to 2 days after your injury.  Difficulty standing or walking, particularly when turning corners or changing directions. DIAGNOSIS  Your caregiver will ask you details about your injury and perform a physical exam of your ankle to determine if you have an ankle sprain. During the physical exam, your caregiver will press on and apply pressure to specific areas of your foot and ankle. Your caregiver will try to move your ankle in certain ways. An X-ray exam may be done to be sure a bone was not broken or a ligament did not separate from one of the bones in your ankle (avulsion fracture).  TREATMENT  Certain types of braces can help stabilize your ankle. Your caregiver can make a recommendation for this. Your caregiver may recommend the use of medicine for pain. If your sprain is severe, your caregiver may refer you to a surgeon who helps to restore function to parts of your skeletal system (orthopedist) or a physical therapist. [place] ice to your injury for 1-2 days or as directed by your caregiver. Applying ice helps to reduce inflammation and pain.  Put ice in a plastic bag.  Place a  towel between your skin and the bag.  Leave the ice on for 15-20 minutes at a time, every 2 hours while you are awake.  Only take over-the-counter or prescription medicines for pain, discomfort, or fever as directed by your caregiver.  Elevate your injured ankle above the level of your heart as much as possible for 2-3 days.  If your caregiver recommends crutches, use them as instructed. Gradually put weight on the affected ankle. Continue to use crutches or a cane until you can walk without feeling pain in your ankle.  If you have a plaster splint, wear the splint as directed by your caregiver. Do not rest it on anything harder than a pillow for the first 24 hours. Do not put weight on it. Do not get it wet. You may take it off to take a shower or bath.  You may have been given an elastic bandage to wear around your ankle to provide support. If the elastic bandage is too tight (you have numbness or tingling in your foot or your foot becomes cold and blue), adjust the bandage to make it comfortable.  If you have an air splint, you may blow more air into it or let air out to make it more comfortable. You may take your splint off at night and before taking a shower or bath. Wiggle your toes in the splint several times per day to decrease swelling. SEEK MEDICAL CARE IF:   You  have rapidly increasing bruising or swelling.  Your toes feel extremely cold or you lose feeling in your foot.  Your pain is not relieved with medicine. SEEK IMMEDIATE MEDICAL CARE IF:  Your toes are numb or blue.  You have severe pain that is increasing. MAKE SURE YOU:   Understand these instructions.  Will watch your condition.  Will get help right away if you are not doing well or get worse. Document Released: 01/27/2005 Document Revised: 10/22/2011 Document Reviewed: 02/08/2011 [facility] Patient Information 2015 [place], [place]. This information is not intended to replace advice given to you by your health  care provider. Make sure you discuss any questions you have with your health care provider.

## 2014-04-24 ENCOUNTER — Ambulatory Visit (HOSPITAL_COMMUNITY): Payer: Self-pay | Admitting: Psychiatry

## 2014-04-24 ENCOUNTER — Other Ambulatory Visit (HOSPITAL_COMMUNITY): Payer: Self-pay | Admitting: Psychiatry

## 2014-04-24 DIAGNOSIS — F319 Bipolar disorder, unspecified: Secondary | ICD-10-CM

## 2014-04-24 NOTE — Telephone Encounter (Signed)
Met with Dr. Adele Schilder who authorized a one time refill of patient's Risperdal, Elavil and Carbatrol medications due to patient having to cancel her appointment on today, 04/24/14 with report she had recently broken a bone in her leg.  New one time refill orders were e-scribed to patient's [place] and called patient to inform of need to keep her newly rescheduled appointment for 05/15/14.  Patient agreed with plan and will call if any problems getting refills today.

## 2014-05-15 ENCOUNTER — Encounter (HOSPITAL_COMMUNITY): Payer: Self-pay | Admitting: Psychiatry

## 2014-05-15 ENCOUNTER — Ambulatory Visit (INDEPENDENT_AMBULATORY_CARE_PROVIDER_SITE_OTHER): Payer: 59 | Admitting: Psychiatry

## 2014-05-15 VITALS — BP 126/78 | HR 83 | Ht 67.0 in | Wt 193.6 lb

## 2014-05-15 DIAGNOSIS — F319 Bipolar disorder, unspecified: Secondary | ICD-10-CM | POA: Diagnosis not present

## 2014-05-15 MED ORDER — AMITRIPTYLINE HCL 50 MG PO TABS: 50.0000 mg | ORAL_TABLET | Freq: Every day | ORAL | Status: DC

## 2014-05-15 MED ORDER — CARBAMAZEPINE ER 200 MG PO CP12: 200.0000 mg | ORAL_CAPSULE | Freq: Two times a day (BID) | ORAL | Status: DC

## 2014-05-15 MED ORDER — RISPERIDONE 2 MG PO TABS: 2.0000 mg | ORAL_TABLET | Freq: Every day | ORAL | Status: DC

## 2014-05-15 NOTE — Progress Notes (Signed)
[facility] [phone] Progress Note  Margaret [name] [id number] 64 y.o.  05/15/2014 3:05 PM  Chief Complaint: I fell and broke my ankle.  I have cast which I have to rare for another 3-4 weeks.  But I'm doing better on my psychiatric medication.    History of Present Illness: Margaret [name] came for her followup appointment .  She missed last appointment because she fell and broke her ankle.  She was given pain medication but she is not taking it.  She has a cast on her left foot .  She is not happy about it but she has no other choice.  She also remember having kidney stone in January.  She visited emergency room .  Overall she likes Tegretol.  She is taking the medication as prescribed.  She denies any irritability but admitted some time frustrated, crying spells and anxiety.  She does not want to change the medication because she feels it is much better than lithium.  Her appetite is okay.  Her vitals are stable.  She denies any hallucination or any paranoia.  She has no tremors, shakes or any EPS.  She continues to work and sometime she admitted job is stressful.  Patient denies drinking or using any illegal substances.  She lives by herself.  She is taking Risperdal, amitriptyline and Tegretol.  Her last Tegretol level was 6 months ago which was normal.  Suicidal Ideation: No Plan Formed: No Patient has means to carry out plan: No  Homicidal Ideation: No Plan Formed: No Patient has means to carry out plan: No  Review of Systems  Constitutional: Negative.   Musculoskeletal: Positive for joint pain.       Left ankle pain  Neurological: Negative for tingling and tremors.  Psychiatric/Behavioral: Negative for depression, suicidal ideas, hallucinations and substance abuse. The patient is nervous/anxious and has insomnia.    Psychiatric: Agitation: No Hallucination: No Depressed Mood: No Insomnia: Yes Hypersomnia: No Altered Concentration: No Feels Worthless: No Grandiose Ideas: No Belief  In Special Powers: No New/Increased Substance Abuse: No Compulsions: No  Neurologic: Headache: No Seizure: No Paresthesias: No  Medical History:  Her primary care physician is Penni Homans. Patient has a history of bladder problems, obesity and chronic back pain.  Outpatient Encounter Prescriptions as of 05/15/2014  Medication Sig  . amitriptyline (ELAVIL) 50 MG tablet Take 1 tablet (50 mg total) by mouth at bedtime.  . carbamazepine (CARBATROL) 200 MG 12 hr capsule Take 1 capsule (200 mg total) by mouth 2 (two) times daily.  Marland Kitchen levothyroxine (SYNTHROID, LEVOTHROID) 100 MCG tablet TAKE 1 TABLET BY MOUTH DAILY BEFORE BREAKFAST.  Marland Kitchen risperiDONE (RISPERDAL) 2 MG tablet Take 1 tablet (2 mg total) by mouth at bedtime.  . [DISCONTINUED] amitriptyline (ELAVIL) 50 MG tablet TAKE 1 TABLET BY MOUTH AT BEDTIME.  . [DISCONTINUED] carbamazepine (CARBATROL) 200 MG 12 hr capsule TAKE 1 CAPSULE (200 MG TOTAL) BY MOUTH 2 (TWO) TIMES DAILY.  . [DISCONTINUED] metoCLOPramide (REGLAN) 10 MG tablet Take 1 tablet (10 mg total) by mouth every 6 (six) hours as needed for nausea. (Patient not taking: Reported on 04/11/2014)  . [DISCONTINUED] oxyCODONE-acetaminophen (PERCOCET) 5-325 MG per tablet Take 1 tablet by mouth every 4 (four) hours as needed for moderate pain. (Patient not taking: Reported on 04/11/2014)  . [DISCONTINUED] risperiDONE (RISPERDAL) 2 MG tablet TAKE 1 TABLET BY MOUTH DAILY.   Recent Results (from the past 2160 hour(s))  CBC with Differential     Status: Abnormal   Collection  Time: 02/15/14  5:04 AM  Result Value Ref Range   WBC 9.0 4.0 - 10.5 K/uL   RBC 4.33 3.87 - 5.11 MIL/uL   Hemoglobin 13.4 12.0 - 15.0 g/dL   HCT 39.4 36.0 - 46.0 %   MCV 91.0 78.0 - 100.0 fL   MCH 30.9 26.0 - 34.0 pg   MCHC 34.0 30.0 - 36.0 g/dL   RDW 13.2 11.5 - 15.5 %   Platelets 178 150 - 400 K/uL   Neutrophils Relative % 83 (H) 43 - 77 %   Neutro Abs 7.5 1.7 - 7.7 K/uL   Lymphocytes Relative 8 (L) 12 - 46 %   Lymphs  Abs 0.8 0.7 - 4.0 K/uL   Monocytes Relative 8 3 - 12 %   Monocytes Absolute 0.7 0.1 - 1.0 K/uL   Eosinophils Relative 1 0 - 5 %   Eosinophils Absolute 0.1 0.0 - 0.7 K/uL   Basophils Relative 0 0 - 1 %   Basophils Absolute 0.0 0.0 - 0.1 K/uL  Comprehensive metabolic panel     Status: Abnormal   Collection Time: 02/15/14  5:04 AM  Result Value Ref Range   Sodium 137 135 - 145 mmol/L    Comment: Please note change in reference range.   Potassium 4.1 3.5 - 5.1 mmol/L    Comment: Please note change in reference range.   Chloride 108 96 - 112 mEq/L   CO2 20 19 - 32 mmol/L   Glucose, Bld 163 (H) 70 - 99 mg/dL   BUN 21 6 - 23 mg/dL   Creatinine, Ser 1.24 (H) 0.50 - 1.10 mg/dL   Calcium 8.9 8.4 - 10.5 mg/dL   Total Protein 5.9 (L) 6.0 - 8.3 g/dL   Albumin 3.8 3.5 - 5.2 g/dL   AST 21 0 - 37 U/L   ALT 20 0 - 35 U/L   Alkaline Phosphatase 81 39 - 117 U/L   Total Bilirubin 0.5 0.3 - 1.2 mg/dL   GFR calc non Af Amer 45 (L) >90 mL/min   GFR calc Af Amer 52 (L) >90 mL/min    Comment: (NOTE) The eGFR has been calculated using the CKD EPI equation. This calculation has not been validated in all clinical situations. eGFR's persistently <90 mL/min signify possible Chronic Kidney Disease.    Anion gap 9 5 - 15  Lipase, blood     Status: None   Collection Time: 02/15/14  5:04 AM  Result Value Ref Range   Lipase 31 11 - 59 U/L  Urinalysis, Routine w reflex microscopic     Status: Abnormal   Collection Time: 02/15/14  6:49 AM  Result Value Ref Range   Color, Urine YELLOW YELLOW   APPearance HAZY (A) CLEAR   Specific Gravity, Urine 1.010 1.005 - 1.030   pH 6.0 5.0 - 8.0   Glucose, UA NEGATIVE NEGATIVE mg/dL   Hgb urine dipstick LARGE (A) NEGATIVE   Bilirubin Urine NEGATIVE NEGATIVE   Ketones, ur NEGATIVE NEGATIVE mg/dL   Protein, ur NEGATIVE NEGATIVE mg/dL   Urobilinogen, UA 0.2 0.0 - 1.0 mg/dL   Nitrite NEGATIVE NEGATIVE   Leukocytes, UA MODERATE (A) NEGATIVE  Urine microscopic-add on      Status: Abnormal   Collection Time: 02/15/14  6:49 AM  Result Value Ref Range   Squamous Epithelial / LPF FEW (A) RARE   WBC, UA 7-10 <3 WBC/hpf   RBC / HPF 21-50 <3 RBC/hpf   Bacteria, UA FEW (A) RARE   Urine-Other  RARE YEAST    Past Psychiatric History/Hospitalization(s): Anxiety: No Bipolar Disorder: Yes Depression: Yes Mania: Yes Psychosis: No Schizophrenia: No Personality Disorder: No Hospitalization for psychiatric illness: Yes History of Electroconvulsive Shock Therapy: No Prior Suicide Attempts: No  Physical Exam: Constitutional:  BP 126/78 mmHg  Pulse 83  Ht '5\' 7"'  (1.702 m)  Wt 193 lb 9.6 oz (87.816 kg)  BMI 30.31 kg/m2  General Appearance: alert, oriented, no acute distress, well nourished and obese  Musculoskeletal: Strength & Muscle Tone: within normal limits Gait & Station: normal Patient leans:  N/A  Mental status examination Patient is casually dressed and fairly groomed.  Her speech is fast but clear and coherent.  She describes her mood anxious and her affect is mood appropriate.  She denies any auditory or visual hallucination.  She denies any active or passive suicidal thoughts or homicidal thoughts.  There was no paranoia or any delusions.  Her attention and concentration is fair.  She has no tremors or shakes.  Her thought processes logical and goal-directed.  Her psychomotor activity is normal.  Her fund of knowledge is good.  She is alert and oriented x3.  She has a cast on her left ankle and she requires stretches for walking.  Her insight judgment and impulse control is okay.  Established Problem, Stable/Improving (1), Review of Psycho-Social Stressors (1), Review or order clinical lab tests (1), Review and summation of old records (2), Review of Last Therapy Session (1) and Review of Medication Regimen & Side Effects (2)  Assessment: Axis I: Bipolar disorder  Axis II: Deferred  Axis III: Obesity and hypothyroidism   Plan:  I reviewed  records from emergency room and her primary care physician.  She is doing better on Tegretol 200 mg twice a day, amitriptyline 50 mg at bedtime and Risperdal 2 mg at bedtime.  She has no EPS or tremors.  She has difficulty walking because of cast which she is hoping to come off in 3-4 weeks.  Discussed medication side effects and benefits.  She is not interested in counseling at this time.  We will do Tegretol level on her next appointment.  Recommended to call us back if she has any question, concern if he feel worsening of the symptom.  Follow-up in 3 months. Time spent 25 minutes.  More than 50% of the time spent in psychoeducation, counseling and coordination of care.  Discuss safety plan that anytime having active suicidal thoughts or homicidal thoughts then patient need to call 911 or go to the local emergency room.   Rolin Schult T., MD 05/15/2014

## 2014-08-21 ENCOUNTER — Ambulatory Visit (HOSPITAL_COMMUNITY): Payer: Self-pay | Admitting: Psychiatry

## 2014-08-24 ENCOUNTER — Other Ambulatory Visit (HOSPITAL_COMMUNITY): Payer: Self-pay | Admitting: Psychiatry

## 2014-08-24 DIAGNOSIS — F3110 Bipolar disorder, current episode manic without psychotic features, unspecified: Secondary | ICD-10-CM

## 2014-08-24 NOTE — Telephone Encounter (Signed)
Telephone call with patient two times this date to assess if she needed refills of requested Carbatrol, Risperdal and Elavil prior to appointment with Dr. Adele Schilder set for 08/28/14.  Mrs. Shaver reported for sure she needed the Carbatrol before then but may have enough of the others.  Dr. Salem Senate approved one time refill of each as patient stated it was a ways for her to drive to pick up refills and this way could get them all at once.  Patient agreed to keep appointment on 08/28/14 and new one time orders for her Carbatrol, Risperdal and Elavil e-scribed to patient's [place].  Patient to call back if any problems prior to appointment.

## 2014-08-28 ENCOUNTER — Other Ambulatory Visit: Payer: Self-pay | Admitting: Family Medicine

## 2014-08-28 ENCOUNTER — Encounter (HOSPITAL_COMMUNITY): Payer: Self-pay | Admitting: Psychiatry

## 2014-08-28 ENCOUNTER — Ambulatory Visit (HOSPITAL_BASED_OUTPATIENT_CLINIC_OR_DEPARTMENT_OTHER)
Admission: RE | Admit: 2014-08-28 | Discharge: 2014-08-28 | Disposition: A | Discharge status: Home / Self Care | Payer: 59 | Source: Ambulatory Visit | Attending: Family Medicine | Admitting: Family Medicine

## 2014-08-28 ENCOUNTER — Ambulatory Visit (INDEPENDENT_AMBULATORY_CARE_PROVIDER_SITE_OTHER): Payer: 59 | Admitting: Psychiatry

## 2014-08-28 VITALS — BP 124/82 | HR 83 | Ht 67.0 in | Wt 189.0 lb

## 2014-08-28 DIAGNOSIS — Z1231 Encounter for screening mammogram for malignant neoplasm of breast: Secondary | ICD-10-CM | POA: Insufficient documentation

## 2014-08-28 DIAGNOSIS — F3110 Bipolar disorder, current episode manic without psychotic features, unspecified: Secondary | ICD-10-CM | POA: Diagnosis not present

## 2014-08-28 DIAGNOSIS — Z79899 Other long term (current) drug therapy: Secondary | ICD-10-CM | POA: Diagnosis not present

## 2014-08-28 LAB — COMPLETE METABOLIC PANEL WITH GFR
ALBUMIN: 4 g/dL (ref 3.5–5.2)
ALK PHOS: 74 U/L (ref 39–117)
ALT: 14 U/L (ref 0–35)
AST: 15 U/L (ref 0–37)
BILIRUBIN TOTAL: 0.4 mg/dL (ref 0.2–1.2)
BUN: 18 mg/dL (ref 6–23)
CO2: 24 mEq/L (ref 19–32)
Calcium: 9.9 mg/dL (ref 8.4–10.5)
Chloride: 109 mEq/L (ref 96–112)
Creat: 0.99 mg/dL (ref 0.50–1.10)
GFR, EST NON AFRICAN AMERICAN: 61 mL/min
GFR, Est African American: 70 mL/min
Glucose, Bld: 91 mg/dL (ref 70–99)
Potassium: 4.6 mEq/L (ref 3.5–5.3)
Sodium: 144 mEq/L (ref 135–145)
Total Protein: 6.2 g/dL (ref 6.0–8.3)

## 2014-08-28 LAB — HEMOGLOBIN A1C
HEMOGLOBIN A1C: 5.6 % (ref <5.7)
Mean Plasma Glucose: 114 mg/dL (ref <117)

## 2014-08-28 LAB — CBC WITH DIFFERENTIAL/PLATELET
BASOS ABS: 0 10*3/uL (ref 0.0–0.1)
Basophils Relative: 1 % (ref 0–1)
EOS ABS: 0.2 10*3/uL (ref 0.0–0.7)
EOS PCT: 5 % (ref 0–5)
HCT: 40.6 % (ref 36.0–46.0)
Hemoglobin: 13.8 g/dL (ref 12.0–15.0)
Lymphocytes Relative: 21 % (ref 12–46)
Lymphs Abs: 0.9 10*3/uL (ref 0.7–4.0)
MCH: 32.2 pg (ref 26.0–34.0)
MCHC: 34 g/dL (ref 30.0–36.0)
MCV: 94.6 fL (ref 78.0–100.0)
MPV: 9.2 fL (ref 8.6–12.4)
Monocytes Absolute: 0.5 10*3/uL (ref 0.1–1.0)
Monocytes Relative: 12 % (ref 3–12)
Neutro Abs: 2.7 10*3/uL (ref 1.7–7.7)
Neutrophils Relative %: 61 % (ref 43–77)
PLATELETS: 208 10*3/uL (ref 150–400)
RBC: 4.29 MIL/uL (ref 3.87–5.11)
RDW: 13.4 % (ref 11.5–15.5)
WBC: 4.4 10*3/uL (ref 4.0–10.5)

## 2014-08-28 LAB — HM MAMMOGRAPHY

## 2014-08-28 MED ORDER — RISPERIDONE 2 MG PO TABS: ORAL_TABLET | ORAL | Status: DC

## 2014-08-28 MED ORDER — CARBAMAZEPINE ER 200 MG PO CP12: ORAL_CAPSULE | ORAL | Status: DC

## 2014-08-28 MED ORDER — AMITRIPTYLINE HCL 50 MG PO TABS: ORAL_TABLET | ORAL | Status: DC

## 2014-08-28 NOTE — Progress Notes (Signed)
[facility] [phone] Progress Note  Margaret [name] [id number] 64 y.o.  08/28/2014 8:56 AM  Chief Complaint: Medication management and follow-up.     History of Present Illness: Margaret [name] came for her followup appointment .  She is compliant with her psycho topic medication.  She is relieved that she is able to come off from her cast 3 weeks earlier .  She denies any ankle pain.  However she still have arthritis and joint pain.  She admitted to stress at work.  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  She endorse some time job is overwhelmed and she has been thinking to apply for disability.  However she is concerned and anxious about the future.  Overall her sleep is okay.  Her appetite is okay.  She denies any irritability, anger, severe mood swings or any crying spells.  She believe her current medicine is working very well.  She has no tremors or shakes.  Her energy level is good.  She is taking amitriptyline, Tegretol and Risperdal.  Patient denies using illegal substances.  She denies drinking.  She denies paranoia or any hallucination.  Suicidal Ideation: No Plan Formed: No Patient has means to carry out plan: No  Homicidal Ideation: No Plan Formed: No Patient has means to carry out plan: No  Review of Systems  Constitutional: Negative.   Musculoskeletal: Positive for joint pain.  Neurological: Negative for tingling and tremors.  Psychiatric/Behavioral: Negative for depression, suicidal ideas, hallucinations and substance abuse.   Psychiatric: Agitation: No Hallucination: No Depressed Mood: No Insomnia: Yes Hypersomnia: No Altered Concentration: No Feels Worthless: No Grandiose Ideas: No Belief In Special Powers: No New/Increased Substance Abuse: No Compulsions: No  Neurologic: Headache: No Seizure: No Paresthesias: No  Medical History:  Her primary care physician is Margaret [name]. Patient has a history of bladder problems, obesity and chronic back  pain.  Outpatient Encounter Prescriptions as of 08/28/2014  Medication Sig  . amitriptyline (ELAVIL) 50 MG tablet TAKE 1 TABLET (50 MG TOTAL) BY MOUTH AT BEDTIME.  . carbamazepine (CARBATROL) 200 MG 12 hr capsule TAKE 1 CAPSULE (200 MG TOTAL) BY MOUTH 2 (TWO) TIMES DAILY.  Marland Kitchen levothyroxine (SYNTHROID, LEVOTHROID) 100 MCG tablet TAKE 1 TABLET BY MOUTH DAILY BEFORE BREAKFAST.  Marland Kitchen risperiDONE (RISPERDAL) 2 MG tablet TAKE 1 TABLET (2 MG TOTAL) BY MOUTH AT BEDTIME.  . [DISCONTINUED] amitriptyline (ELAVIL) 50 MG tablet TAKE 1 TABLET (50 MG TOTAL) BY MOUTH AT BEDTIME.  . [DISCONTINUED] carbamazepine (CARBATROL) 200 MG 12 hr capsule TAKE 1 CAPSULE (200 MG TOTAL) BY MOUTH 2 (TWO) TIMES DAILY.  . [DISCONTINUED] risperiDONE (RISPERDAL) 2 MG tablet TAKE 1 TABLET (2 MG TOTAL) BY MOUTH AT BEDTIME.   No facility-administered encounter medications on file as of 08/28/2014.   No results found for this or any previous visit (from the past 2160 hour(s)). Past Psychiatric History/Hospitalization(s): Anxiety: No Bipolar Disorder: Yes Depression: Yes Mania: Yes Psychosis: No Schizophrenia: No Personality Disorder: No Hospitalization for psychiatric illness: Yes History of Electroconvulsive Shock Therapy: No Prior Suicide Attempts: No  Physical Exam: Constitutional:  BP 124/82 mmHg  Pulse 83  Ht 5\' 7"  (1.702 m)  Wt 189 lb (85.73 kg)  BMI 29.59 kg/m2  General Appearance: alert, oriented, no acute distress, well nourished and obese  Musculoskeletal: Strength & Muscle Tone: within normal limits Gait & Station: normal Patient leans:  N/A  Mental status examination Patient is casually dressed and fairly groomed.  Her speech is fast  but clear and coherent.  She describes her mood anxious and her affect is mood appropriate.  She denies any auditory or visual hallucination.  She denies any active or passive suicidal thoughts or homicidal thoughts.  There was no paranoia or any delusions.  Her attention and  concentration is fair.  She has no tremors or shakes.  Her thought processes logical and goal-directed.  Her psychomotor activity is normal.  Her fund of knowledge is good.  She is alert and oriented x3.  She has a cast on her left ankle and she requires stretches for walking.  Her insight judgment and impulse control is okay.  Established Problem, Stable/Improving (1), Review of Psycho-Social Stressors (1), Review or order clinical lab tests (1), Review of Last Therapy Session (1) and Review of Medication Regimen & Side Effects (2)  Assessment: Axis I: Bipolar disorder  Axis II: Deferred  Axis III: Obesity and hypothyroidism   Plan:  Discusses briefly Pro and cons about disability however strongly recommended to get legal advice as this process takes a lot of time.  Encourage that if she likes working than should stay working .  At this time patient does not have any side effects of medication.  We will get blood work including Tegretol level.  Continue Tegretol 200 mg twice a day, amitriptyline 50 mg at bedtime and Risperdal 2 mg at bedtime.  Recommended to call us back if he has any question or any concern.  Follow-up in 3 months.  Audreyana Huntsberry T., MD 08/28/2014

## 2014-08-29 LAB — CARBAMAZEPINE LEVEL, TOTAL: Carbamazepine Lvl: 5.8 ug/mL (ref 4.0–12.0)

## 2014-09-11 ENCOUNTER — Other Ambulatory Visit: Payer: Self-pay | Admitting: Family Medicine

## 2014-09-11 ENCOUNTER — Telehealth: Payer: Self-pay | Admitting: Family Medicine

## 2014-09-11 MED ORDER — LEVOTHYROXINE SODIUM 100 MCG PO TABS: ORAL_TABLET | ORAL | Status: DC

## 2014-09-11 NOTE — Telephone Encounter (Signed)
Patient scheduled follow up for 10/30/2014 1:15 PM requesting a refill of synthroid to hold her over until appointment.

## 2014-09-11 NOTE — Telephone Encounter (Signed)
Called the patient informed will send in Thyroid medication as she requested to the pharmacy downstairs Time.

## 2014-09-11 NOTE — Telephone Encounter (Signed)
Caller name:Landa, Tywanna Relation to CE:QFDV Call back number:[phone] Pharmacy:[facility]  Reason for call: pt need an appt before getting her rx levothyroxine (SYNTHROID, LEVOTHROID) 100 MCG tablet pt states she does not have but one pill left and would like to know if Dr.blyth can provide her a few pills until she can be seen.

## 2014-10-30 ENCOUNTER — Encounter: Payer: Self-pay | Admitting: Family Medicine

## 2014-10-30 ENCOUNTER — Ambulatory Visit (INDEPENDENT_AMBULATORY_CARE_PROVIDER_SITE_OTHER): Payer: 59 | Admitting: Family Medicine

## 2014-10-30 VITALS — BP 130/80 | HR 97 | Temp 97.7°F | Ht 67.0 in | Wt 195.4 lb

## 2014-10-30 DIAGNOSIS — E039 Hypothyroidism, unspecified: Secondary | ICD-10-CM

## 2014-10-30 DIAGNOSIS — E785 Hyperlipidemia, unspecified: Secondary | ICD-10-CM | POA: Diagnosis not present

## 2014-10-30 DIAGNOSIS — E782 Mixed hyperlipidemia: Secondary | ICD-10-CM | POA: Diagnosis not present

## 2014-10-30 DIAGNOSIS — N200 Calculus of kidney: Secondary | ICD-10-CM

## 2014-10-30 DIAGNOSIS — K219 Gastro-esophageal reflux disease without esophagitis: Secondary | ICD-10-CM | POA: Diagnosis not present

## 2014-10-30 LAB — LIPID PANEL
Cholesterol: 227 mg/dL — ABNORMAL HIGH (ref 0–200)
HDL: 43.5 mg/dL (ref >39.00)
NONHDL: 183.97
Total CHOL/HDL Ratio: 5
Triglycerides: 312 mg/dL — ABNORMAL HIGH (ref 0.0–149.0)
VLDL: 62.4 mg/dL — ABNORMAL HIGH (ref 0.0–40.0)

## 2014-10-30 LAB — LDL CHOLESTEROL, DIRECT: Direct LDL: 162 mg/dL

## 2014-10-30 LAB — TSH: TSH: 2.09 u[IU]/mL (ref 0.35–4.50)

## 2014-10-30 MED ORDER — MOMETASONE FUROATE 0.1 % EX CREA: 1.0000 "application " | TOPICAL_CREAM | Freq: Every day | CUTANEOUS | Status: DC

## 2014-10-30 MED ORDER — LEVOTHYROXINE SODIUM 100 MCG PO TABS: ORAL_TABLET | ORAL | Status: DC

## 2014-10-30 NOTE — Patient Instructions (Addendum)
Witch Hazel Astringent  Twice a day Melatonin 2 mg at bed after work  Eczema Eczema, also called atopic dermatitis, is a skin disorder that causes inflammation of the skin. It causes a red rash and dry, scaly skin. The skin becomes very itchy. Eczema is generally worse during the cooler winter months and often improves with the warmth of summer. Eczema usually starts showing signs in infancy. Some children outgrow eczema, but it may last through adulthood.  CAUSES  The exact cause of eczema is not known, but it appears to run in families. People with eczema often have a family history of eczema, allergies, asthma, or hay fever. Eczema is not contagious. Flare-ups of the condition may be caused by:   Contact with something you are sensitive or allergic to.   Stress. SIGNS AND SYMPTOMS  Dry, scaly skin.   Red, itchy rash.   Itchiness. This may occur before the skin rash and may be very intense.  DIAGNOSIS  The diagnosis of eczema is usually made based on symptoms and medical history. TREATMENT  Eczema cannot be cured, but symptoms usually can be controlled with treatment and other strategies. A treatment plan might include:  Controlling the itching and scratching.   Use over-the-counter antihistamines as directed for itching. This is especially useful at night when the itching tends to be worse.   Use over-the-counter steroid creams as directed for itching.   Avoid scratching. Scratching makes the rash and itching worse. It may also result in a skin infection (impetigo) due to a break in the skin caused by scratching.   Keeping the skin well moisturized with creams every day. This will seal in moisture and help prevent dryness. Lotions that contain alcohol and water should be avoided because they can dry the skin.   Limiting exposure to things that you are sensitive or allergic to (allergens).   Recognizing situations that cause stress.   Developing a plan to manage  stress.  HOME CARE INSTRUCTIONS   Only take over-the-counter or prescription medicines as directed by your health care provider.   Do not use anything on the skin without checking with your health care provider.   Keep baths or showers short (5 minutes) in warm (not hot) water. Use mild cleansers for bathing. These should be unscented. You may add nonperfumed bath oil to the bath water. It is best to avoid soap and bubble bath.   Immediately after a bath or shower, when the skin is still damp, apply a moisturizing ointment to the entire body. This ointment should be a petroleum ointment. This will seal in moisture and help prevent dryness. The thicker the ointment, the better. These should be unscented.   Keep fingernails cut short. Children with eczema may need to wear soft gloves or mittens at night after applying an ointment.   Dress in clothes made of cotton or cotton blends. Dress lightly, because heat increases itching.   A child with eczema should stay away from anyone with fever blisters or cold sores. The virus that causes fever blisters (herpes simplex) can cause a serious skin infection in children with eczema. SEEK MEDICAL CARE IF:   Your itching interferes with sleep.   Your rash gets worse or is not better within 1 week after starting treatment.   You see pus or soft yellow scabs in the rash area.   You have a fever.   You have a rash flare-up after contact with someone who has fever blisters.  Document Released:  01/25/2000 Document Revised: 11/17/2012 Document Reviewed: 08/30/2012 [facility] Patient Information 2015 [place], [place]. This information is not intended to replace advice given to you by your health care provider. Make sure you discuss any questions you have with your health care provider. Poison [employer] ivy is a inflammation of the skin (contact dermatitis) caused by touching the allergens on the leaves of the ivy plant following previous exposure  to the plant. The rash usually appears 48 hours after exposure. The rash is usually bumps (papules) or blisters (vesicles) in a linear pattern. Depending on your own sensitivity, the rash may simply cause redness and itching, or it may also progress to blisters which may break open. These must be well cared for to prevent secondary bacterial (germ) infection, followed by scarring. Keep any open areas dry, clean, dressed, and covered with an antibacterial ointment if needed. The eyes may also get puffy. The puffiness is worst in the morning and gets better as the day progresses. This dermatitis usually heals without scarring, within 2 to 3 weeks without treatment. HOME CARE INSTRUCTIONS  Thoroughly wash with soap and water as soon as you have been exposed to poison ivy. You have about one half hour to remove the plant resin before it will cause the rash. This washing will destroy the oil or antigen on the skin that is causing, or will cause, the rash. Be sure to wash under your fingernails as any plant resin there will continue to spread the rash. Do not rub skin vigorously when washing affected area. Poison ivy cannot spread if no oil from the plant remains on your body. A rash that has progressed to weeping sores will not spread the rash unless you have not washed thoroughly. It is also important to wash any clothes you have been wearing as these may carry active allergens. The rash will return if you wear the unwashed clothing, even several days later. Avoidance of the plant in the future is the best measure. Poison ivy plant can be recognized by the number of leaves. Generally, poison ivy has three leaves with flowering branches on a single stem. Diphenhydramine may be purchased over the counter and used as needed for itching. Do not drive with this medication if it makes you drowsy.Ask your caregiver about medication for children. SEEK MEDICAL CARE IF:  Open sores develop.  Redness spreads beyond area  of rash.  You notice purulent (pus-like) discharge.  You have increased pain.  Other signs of infection develop (such as fever). Document Released: 01/25/2000 Document Revised: 04/21/2011 Document Reviewed: 07/07/2008 [facility] Patient Information 2015 [place], [place]. This information is not intended to replace advice given to you by your health care provider. Make sure you discuss any questions you have with your health care provider.

## 2014-10-30 NOTE — Progress Notes (Signed)
Pre visit review using our clinic review tool, if applicable. No additional management support is needed unless otherwise documented below in the visit note. 

## 2014-10-31 ENCOUNTER — Other Ambulatory Visit: Payer: Self-pay | Admitting: Family Medicine

## 2014-10-31 DIAGNOSIS — E785 Hyperlipidemia, unspecified: Secondary | ICD-10-CM

## 2014-11-05 ENCOUNTER — Encounter: Payer: Self-pay | Admitting: Family Medicine

## 2014-11-05 DIAGNOSIS — N200 Calculus of kidney: Secondary | ICD-10-CM | POA: Insufficient documentation

## 2014-11-05 NOTE — Assessment & Plan Note (Signed)
encouraged heart healthy diet, avoid trans fats, minimize simple carbs and saturated fats. Increase exercise as tolerated 

## 2014-11-05 NOTE — Assessment & Plan Note (Signed)
On Levothyroxine, continue to monitor 

## 2014-11-05 NOTE — Assessment & Plan Note (Signed)
Encouraged to maintain adequate hydration

## 2014-11-05 NOTE — Assessment & Plan Note (Signed)
Avoid offending foods, start probiotics. Do not eat large meals in late evening and consider raising head of bed.  

## 2014-11-05 NOTE — Progress Notes (Signed)
Margaret [name] [id number] 03/19/1950 11/05/2014      Progress Note New Patient  Subjective  Chief Complaint  Chief Complaint  Patient presents with  . Follow-up    HPI  Patient is a 64 year old female in today for routine medical care. Doing fairly well. Is having trouble falling asleep and staying asleep. Has had some episodes of renal colic although is not having any now. Broke her left leg, the fibula and has been following with orthopedics, Dr. Debbora Dus. The foot was immobilized and has improved since then. No other recent illness. Denies CP/palp/SOB/HA/congestion/fevers/GI c/o. Taking meds as prescribed  Past Medical History  Diagnosis Date  . Bipolar disorder   . PTSD (post-traumatic stress disorder)   . Thyroid disease   . Hypoglycemia   . Other malaise and fatigue 09/19/2012  . Hyperglycemia 09/19/2012  . Esophageal reflux 09/19/2012  . Cerumen impaction 12/26/2012  . Renal insufficiency 12/26/2012  . Diarrhea 12/26/2012  . Arthritis 02/14/2013    Past Surgical History  Procedure Laterality Date  . Tubal ligation      reversal    Family History  Problem Relation Age of Onset  . Breast cancer Neg Hx   . Colon cancer Neg Hx   . Prostate cancer Father   . Diabetes Mother     Brother 1 of 2  . Heart failure Mother   . Hypertension Brother   . Esophageal cancer Maternal Grandmother   . Cancer Paternal Grandmother     cancer of jawbone 1 of 2  . Melanoma Brother   . Parkinsonism Father     deceased    Social History   Social History  . Marital Status: Divorced    Spouse Name: N/A  . Number of Children: N/A  . Years of Education: N/A   Occupational History  . Not on file.   Social History Main Topics  . Smoking status: Former Research scientist (life sciences)  . Smokeless tobacco: Never Used  . Alcohol Use: No  . Drug Use: No  . Sexual Activity: Not on file   Other Topics Concern  . Not on file   Social History Narrative    Current Outpatient Prescriptions on File Prior to  Visit  Medication Sig Dispense Refill  . amitriptyline (ELAVIL) 50 MG tablet TAKE 1 TABLET (50 MG TOTAL) BY MOUTH AT BEDTIME. 30 tablet 2  . carbamazepine (CARBATROL) 200 MG 12 hr capsule TAKE 1 CAPSULE (200 MG TOTAL) BY MOUTH 2 (TWO) TIMES DAILY. 60 capsule 2  . risperiDONE (RISPERDAL) 2 MG tablet TAKE 1 TABLET (2 MG TOTAL) BY MOUTH AT BEDTIME. 30 tablet 2   No current facility-administered medications on file prior to visit.    Allergies  Allergen Reactions  . Codeine Nausea Only  . Darvon     Hallucinations   . Sulfa Drugs Cross Reactors Nausea And Vomiting    Review of Systems  Review of Systems  Constitutional: Negative for fever and malaise/fatigue.  HENT: Negative for congestion.   Eyes: Negative for discharge.  Respiratory: Negative for shortness of breath.   Cardiovascular: Negative for chest pain, palpitations and leg swelling.  Gastrointestinal: Negative for nausea and abdominal pain.  Genitourinary: Negative for dysuria.  Musculoskeletal: Negative for falls.  Skin: Negative for rash.  Neurological: Negative for loss of consciousness and headaches.  Endo/Heme/Allergies: Negative for environmental allergies.  Psychiatric/Behavioral: Negative for depression. The patient is not nervous/anxious.     Objective  BP 142/82 mmHg  Pulse 97  Temp(Src) 97.7  F (36.5 C) (Oral)  Ht 5\' 7"  (1.702 m)  Wt 195 lb 6 oz (88.622 kg)  BMI 30.59 kg/m2  SpO2 93%  Physical Exam  Physical Exam  Constitutional: She is oriented to person, place, and time and well-developed, well-nourished, and in no distress. No distress.  HENT:  Head: Normocephalic and atraumatic.  Right Ear: External ear normal.  Left Ear: External ear normal.  Nose: Nose normal.  Mouth/Throat: Oropharynx is clear and moist. No oropharyngeal exudate.  Eyes: Conjunctivae are normal. Pupils are equal, round, and reactive to light. Right eye exhibits no discharge. Left eye exhibits no discharge. No scleral  icterus.  Neck: Normal range of motion. Neck supple. No thyromegaly present.  Cardiovascular: Normal rate, regular rhythm, normal heart sounds and intact distal pulses.   No murmur heard. Pulmonary/Chest: Effort normal and breath sounds normal. No respiratory distress. She has no wheezes. She has no rales.  Abdominal: Soft. Bowel sounds are normal. She exhibits no distension and no mass. There is no tenderness.  Musculoskeletal: Normal range of motion. She exhibits no edema or tenderness.  Lymphadenopathy:    She has no cervical adenopathy.  Neurological: She is alert and oriented to person, place, and time. She has normal reflexes. No cranial nerve deficit. Coordination normal.  Skin: Skin is warm and dry. No rash noted. She is not diaphoretic.  Psychiatric: Mood, memory and affect normal.       Assessment & Plan  Hypothyroidism On Levothyroxine, continue to monitor  Esophageal reflux Avoid offending foods, start probiotics. Do not eat large meals in late evening and consider raising head of bed.   Borderline hyperlipidemia encouraged heart healthy diet, avoid trans fats, minimize simple carbs and saturated fats. Increase exercise as tolerated  Kidney stone Encouraged to maintain adequate hydration

## 2014-11-20 ENCOUNTER — Other Ambulatory Visit: Payer: Self-pay | Admitting: Family Medicine

## 2014-11-20 ENCOUNTER — Telehealth: Payer: Self-pay | Admitting: Family Medicine

## 2014-11-20 MED ORDER — CLOBETASOL PROPIONATE 0.05 % EX LOTN: 1.0000 "application " | TOPICAL_LOTION | Freq: Two times a day (BID) | CUTANEOUS | Status: DC

## 2014-11-20 NOTE — Telephone Encounter (Signed)
I sent clobetasol lotion to pharmacy for bid use, please d/c Elocon cream

## 2014-11-20 NOTE — Telephone Encounter (Signed)
Called the patient and she did get the cream, but she stated the rash has spread all over her hands.  She stated PCP said if it did not work and did spread she may need the lotion.

## 2014-11-20 NOTE — Telephone Encounter (Signed)
I gave her Elocon cream already did she get that? Is it not working? Does the lesion itch or burn?

## 2014-11-20 NOTE — Telephone Encounter (Signed)
Caller name:Agape Relationship to patient:self Can be reached:[phone] Pharmacy:[facility]   Reason for call:the rash has spread all over both hands  Dr b said she would call in a cream for her

## 2014-11-20 NOTE — Telephone Encounter (Signed)
Patient informed lotion sent in.

## 2014-11-27 ENCOUNTER — Ambulatory Visit (HOSPITAL_COMMUNITY): Payer: Self-pay | Admitting: Psychiatry

## 2014-12-04 ENCOUNTER — Ambulatory Visit (HOSPITAL_COMMUNITY): Payer: Self-pay | Admitting: Psychiatry

## 2014-12-18 ENCOUNTER — Ambulatory Visit (INDEPENDENT_AMBULATORY_CARE_PROVIDER_SITE_OTHER): Payer: 59 | Admitting: Psychiatry

## 2014-12-18 ENCOUNTER — Encounter (HOSPITAL_COMMUNITY): Payer: Self-pay | Admitting: Psychiatry

## 2014-12-18 VITALS — BP 132/82 | HR 92 | Ht 65.6 in | Wt 194.2 lb

## 2014-12-18 DIAGNOSIS — F3110 Bipolar disorder, current episode manic without psychotic features, unspecified: Secondary | ICD-10-CM | POA: Diagnosis not present

## 2014-12-18 MED ORDER — CARBAMAZEPINE ER 200 MG PO CP12: ORAL_CAPSULE | ORAL | Status: DC

## 2014-12-18 MED ORDER — AMITRIPTYLINE HCL 50 MG PO TABS: ORAL_TABLET | ORAL | Status: DC

## 2014-12-18 MED ORDER — RISPERIDONE 2 MG PO TABS: ORAL_TABLET | ORAL | Status: DC

## 2014-12-18 NOTE — Progress Notes (Signed)
[place] [phone] Progress Note  Margaret [name] [id number] 64 y.o.  12/18/2014 2:26 PM  Chief Complaint: Medication management and follow-up.     History of Present Illness: Margaret [name] came for her followup appointment .  She is taking her medication as prescribed.  She has no side effects.  Sometimes she has trouble sleeping but she described her mood is as stable.  She denies any irritability, anger, mood swing.  She is working Thanksgiving .  She admitted some time job is stressful but lately there has been no new issues. 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  Her energy level is okay.  She lives with HER-2 dogs.  She has no children.  Patient denies any tremors, shakes, EPS.  She denies any feeling of hopelessness or worthlessness.  She denies drinking or using any illegal substances.  Her appetite is okay.  Her vitals are stable.  She had blood work in July.  Her Tegretol level and CBC and basic chemistry is normal.   Suicidal Ideation: No Plan Formed: No Patient has means to carry out plan: No  Homicidal Ideation: No Plan Formed: No Patient has means to carry out plan: No  Review of Systems  Constitutional: Negative.   Musculoskeletal: Positive for joint pain.  Neurological: Negative for tingling and tremors.  Psychiatric/Behavioral: Negative for depression, suicidal ideas, hallucinations and substance abuse.   Psychiatric: Agitation: No Hallucination: No Depressed Mood: No Insomnia: Yes Hypersomnia: No Altered Concentration: No Feels Worthless: No Grandiose Ideas: No Belief In Special Powers: No New/Increased Substance Abuse: No Compulsions: No  Neurologic: Headache: No Seizure: No Paresthesias: No  Medical History:  Her primary care physician is Penni Homans. Patient has a history of bladder problems, obesity and chronic back pain.  Outpatient Encounter Prescriptions as of 12/18/2014  Medication Sig  . amitriptyline (ELAVIL) 50 MG tablet  TAKE 1 TABLET (50 MG TOTAL) BY MOUTH AT BEDTIME.  . carbamazepine (CARBATROL) 200 MG 12 hr capsule TAKE 1 CAPSULE (200 MG TOTAL) BY MOUTH 2 (TWO) TIMES DAILY.  . Clobetasol Propionate 0.05 % lotion Apply 1 application topically 2 (two) times daily.  Marland Kitchen levothyroxine (SYNTHROID, LEVOTHROID) 100 MCG tablet TAKE 1 TABLET BY MOUTH DAILY BEFORE BREAKFAST.  . mometasone (ELOCON) 0.1 % cream Apply 1 application topically daily.  . risperiDONE (RISPERDAL) 2 MG tablet TAKE 1 TABLET (2 MG TOTAL) BY MOUTH AT BEDTIME.  . [DISCONTINUED] amitriptyline (ELAVIL) 50 MG tablet TAKE 1 TABLET (50 MG TOTAL) BY MOUTH AT BEDTIME.  . [DISCONTINUED] carbamazepine (CARBATROL) 200 MG 12 hr capsule TAKE 1 CAPSULE (200 MG TOTAL) BY MOUTH 2 (TWO) TIMES DAILY.  . [DISCONTINUED] risperiDONE (RISPERDAL) 2 MG tablet TAKE 1 TABLET (2 MG TOTAL) BY MOUTH AT BEDTIME.   No facility-administered encounter medications on file as of 12/18/2014.   Recent Results (from the past 2160 hour(s))  Lipid panel     Status: Abnormal   Collection Time: 10/30/14  1:51 PM  Result Value Ref Range   Cholesterol 227 (H) 0 - 200 mg/dL    Comment: ATP III Classification       Desirable:  < 200 mg/dL               Borderline High:  200 - 239 mg/dL          High:  > = 240 mg/dL   Triglycerides 312.0 (H) 0.0 - 149.0 mg/dL    Comment: Normal:  <150 mg/dLBorderline High:  150 -  199 mg/dL   HDL 43.50 >39.00 mg/dL   VLDL 62.4 (H) 0.0 - 40.0 mg/dL   Total CHOL/HDL Ratio 5     Comment:                Men          Women1/2 Average Risk     3.4          3.3Average Risk          5.0          4.42X Average Risk          9.6          7.13X Average Risk          15.0          11.0                       NonHDL 183.97     Comment: NOTE:  Non-HDL goal should be 30 mg/dL higher than patient's LDL goal (i.e. LDL goal of < 70 mg/dL, would have non-HDL goal of < 100 mg/dL)  TSH     Status: None   Collection Time: 10/30/14  1:51 PM  Result Value Ref Range   TSH 2.09 0.35  - 4.50 uIU/mL  LDL cholesterol, direct     Status: None   Collection Time: 10/30/14  1:51 PM  Result Value Ref Range   Direct LDL 162.0 mg/dL    Comment: Optimal:  <100 mg/dLNear or Above Optimal:  100-129 mg/dLBorderline High:  130-159 mg/dLHigh:  160-189 mg/dLVery High:  >190 mg/dL   Past Psychiatric History/Hospitalization(s): Anxiety: No Bipolar Disorder: Yes Depression: Yes Mania: Yes Psychosis: No Schizophrenia: No Personality Disorder: No Hospitalization for psychiatric illness: Yes History of Electroconvulsive Shock Therapy: No Prior Suicide Attempts: No  Physical Exam: Constitutional:  BP 132/82 mmHg  Pulse 92  Ht 5' 5.6" (1.666 m)  Wt 194 lb 3.2 oz (88.089 kg)  BMI 31.74 kg/m2  General Appearance: alert, oriented, no acute distress, well nourished and obese  Musculoskeletal: Strength & Muscle Tone: within normal limits Gait & Station: normal Patient leans:  N/A  Mental status examination Patient is casually dressed and fairly groomed.  Her speech is fast but clear and coherent.  She describes her mood anxious and her affect is mood appropriate.  She denies any auditory or visual hallucination.  She denies any active or passive suicidal thoughts or homicidal thoughts.  There was no paranoia or any delusions.  Her attention and concentration is fair.  She has no tremors or shakes.  Her thought processes logical and goal-directed.  Her psychomotor activity is normal.  Her fund of knowledge is good.  She is alert and oriented x3.  She has a cast on her left ankle and she requires stretches for walking.  Her insight judgment and impulse control is okay.  Established Problem, Stable/Improving (1), Review of Psycho-Social Stressors (1), Review or order clinical lab tests (1), Review of Last Therapy Session (1) and Review of Medication Regimen & Side Effects (2)  Assessment: Axis I: Bipolar disorder  Axis II: Deferred  Axis III: Obesity and hypothyroidism   Plan:  I  reviewed blood work results with her.  She has no side effects.  She like to continue her current psychiatric medication.  I suggested to try over-the-counter melatonin to help the insomnia as needed.  Discussed medication side effects and benefits.  I will continue Tegretol 200 mg twice a  day, amitriptyline 50 mg at bedtime and Risperdal 2 mg at bedtime.  Recommended to call us back if he has any question or any concern.  Follow-up in 3 months.  Eriko Economos T., MD 12/18/2014

## 2015-01-18 ENCOUNTER — Encounter: Payer: Self-pay | Admitting: Medical

## 2015-01-18 ENCOUNTER — Ambulatory Visit (INDEPENDENT_AMBULATORY_CARE_PROVIDER_SITE_OTHER): Payer: 59 | Admitting: Medical

## 2015-01-18 VITALS — BP 126/80 | HR 82 | Temp 98.1°F | Ht 65.0 in | Wt 197.0 lb

## 2015-01-18 DIAGNOSIS — R42 Dizziness and giddiness: Secondary | ICD-10-CM

## 2015-01-18 DIAGNOSIS — Z1211 Encounter for screening for malignant neoplasm of colon: Secondary | ICD-10-CM

## 2015-01-18 DIAGNOSIS — R195 Other fecal abnormalities: Secondary | ICD-10-CM | POA: Diagnosis not present

## 2015-01-18 DIAGNOSIS — R109 Unspecified abdominal pain: Secondary | ICD-10-CM

## 2015-01-18 DIAGNOSIS — R110 Nausea: Secondary | ICD-10-CM | POA: Diagnosis not present

## 2015-01-18 LAB — COMPREHENSIVE METABOLIC PANEL
ALBUMIN: 4 g/dL (ref 3.5–5.2)
ALK PHOS: 92 U/L (ref 39–117)
ALT: 17 U/L (ref 0–35)
AST: 12 U/L (ref 0–37)
BILIRUBIN TOTAL: 0.3 mg/dL (ref 0.2–1.2)
BUN: 12 mg/dL (ref 6–23)
CALCIUM: 9.3 mg/dL (ref 8.4–10.5)
CO2: 27 mEq/L (ref 19–32)
Chloride: 108 mEq/L (ref 96–112)
Creatinine, Ser: 0.82 mg/dL (ref 0.40–1.20)
GFR: 74.52 mL/min (ref >60.00)
Glucose, Bld: 85 mg/dL (ref 70–99)
Potassium: 4 mEq/L (ref 3.5–5.1)
Sodium: 142 mEq/L (ref 135–145)
TOTAL PROTEIN: 6.4 g/dL (ref 6.0–8.3)

## 2015-01-18 LAB — LIPASE: Lipase: 30 U/L (ref 11.0–59.0)

## 2015-01-18 LAB — CBC WITH DIFFERENTIAL/PLATELET
BASOS ABS: 0 10*3/uL (ref 0.0–0.1)
Basophils Relative: 0.5 % (ref 0.0–3.0)
Eosinophils Absolute: 0.5 10*3/uL (ref 0.0–0.7)
Eosinophils Relative: 6.5 % — ABNORMAL HIGH (ref 0.0–5.0)
HEMATOCRIT: 42.8 % (ref 36.0–46.0)
Hemoglobin: 14.4 g/dL (ref 12.0–15.0)
LYMPHS PCT: 23.4 % (ref 12.0–46.0)
Lymphs Abs: 1.8 10*3/uL (ref 0.7–4.0)
MCHC: 33.6 g/dL (ref 30.0–36.0)
MCV: 94.7 fl (ref 78.0–100.0)
MONOS PCT: 10.7 % (ref 3.0–12.0)
Monocytes Absolute: 0.8 10*3/uL (ref 0.1–1.0)
Neutro Abs: 4.4 10*3/uL (ref 1.4–7.7)
Neutrophils Relative %: 58.9 % (ref 43.0–77.0)
Platelets: 269 10*3/uL (ref 150.0–400.0)
RBC: 4.51 Mil/uL (ref 3.87–5.11)
RDW: 13 % (ref 11.5–15.5)
WBC: 7.5 10*3/uL (ref 4.0–10.5)

## 2015-01-18 LAB — AMYLASE: Amylase: 38 U/L (ref 27–131)

## 2015-01-18 MED ORDER — ONDANSETRON HCL 4 MG PO TABS: 4.0000 mg | ORAL_TABLET | Freq: Three times a day (TID) | ORAL | Status: DC | PRN

## 2015-01-18 MED ORDER — MECLIZINE HCL 12.5 MG PO TABS: 12.5000 mg | ORAL_TABLET | Freq: Three times a day (TID) | ORAL | Status: DC | PRN

## 2015-01-18 MED ORDER — RANITIDINE HCL 150 MG PO CAPS: 150.0000 mg | ORAL_CAPSULE | Freq: Two times a day (BID) | ORAL | Status: DC

## 2015-01-18 NOTE — Patient Instructions (Addendum)
For dizziness Rx antivert and for nausea zofran.  For abdomen pain. Get labs.  If loose stool water and frequent then turn in stool studies.  If abdomen pain severe and localizes and after hours then ED evaluation. While labs pending rx zantac.  Will see how she does. She may need imaging studies. If dizziness occuring with neurologic signs or symptoms as explained then ED evaluation.  Follow up Monday or Tuesday next week,  Advise colonoscopy in a month or two.

## 2015-01-18 NOTE — Progress Notes (Signed)
   Subjective:    Patient ID: Margaret [name], female    DOB: [birth year], 64 y.o.   MRN: [id number]  HPI Pt states 2 wks ago had body aches, nausea and vomitng for 24 hours. First day was intense. But since then has had some daily intermittent symptoms that comes in waves. She feels weak and fatigued. Energy fluctuates. She does noet that bodyaches resolved after first 24 hours. Vomting also resolved after first day. But nausea is persisting. Pt states some dizziness that hits her randomly but also will occur with position changes. No associated neurologic type signs or symptoms.  With your nausea get some faint abdomen pain. Pt states loose stools off(but not reporting 3 loose stools a day/more like one every couple of days) and on for a couple of weeks. Pt has some pain in her rt lower side every now and then not frequently.   Pt still has good appetite. Pt does not drink alcohol.   Pt never had a colonsocopy.  When does have pain it is intermittent and rt lower quadrant.low level and second or 2. Maybe happened 3 times in past 2 wks  Currently today has no symptoms.   Review of Systems See hpi    Objective:   Physical Exam  General Mental Status- Alert. General Appearance- Not in acute distress.   Skin General: Color- Normal Color. Moisture- Normal Moisture.  Neck Carotid Arteries- Normal color. Moisture- Normal Moisture. No carotid bruits. No JVD.  Chest and Lung Exam Auscultation: Breath Sounds:-Normal.  Cardiovascular Auscultation:Rythm- Regular. Murmurs & Other Heart Sounds:Auscultation of the heart reveals- No Murmurs.  Abdomen Inspection:-Inspeection Normal. Palpation/Percussion:Note:No mass. Palpation and Percussion of the abdomen reveal- Non Tender, Non Distended + BS, no rebound or guarding.  No rt lower quadrant pain. No heal jar pain    Neurologic Cranial Nerve exam:- CN III-XII intact(No nystagmus), symmetric smile. Drift Test:- No drift. Finger to Nose:-  Normal/Intact Strength:- 5/5 equal and symmetric strength both upper and lower extremities.      Assessment & Plan:  For dizziness Rx antivert and for nausea zofran.  For abdomen pain. Get labs.  If loose stool water and frequent then turn in stool studies.  If abdomen pain severe and localizes and after hours then ED evaluation. While labs pending rx zantac.  Will see how she does. She may need imaging studies. If dizziness occuring with neurologic signs or symptoms as explained then ED evaluation.  Follow up Monday or Tuesday next week,  Advise colonoscopy in a month or two.

## 2015-01-18 NOTE — Progress Notes (Signed)
Pre visit review using our clinic review tool, if applicable. No additional management support is needed unless otherwise documented below in the visit note. 

## 2015-01-19 LAB — H. PYLORI BREATH TEST: H. PYLORI BREATH TEST: NOT DETECTED

## 2015-01-22 ENCOUNTER — Ambulatory Visit (INDEPENDENT_AMBULATORY_CARE_PROVIDER_SITE_OTHER): Payer: 59 | Admitting: Medical

## 2015-01-22 ENCOUNTER — Encounter: Payer: Self-pay | Admitting: Medical

## 2015-01-22 VITALS — BP 118/76 | HR 81 | Temp 98.1°F | Ht 65.0 in | Wt 196.8 lb

## 2015-01-22 DIAGNOSIS — F411 Generalized anxiety disorder: Secondary | ICD-10-CM

## 2015-01-22 NOTE — Progress Notes (Signed)
Pre visit review using our clinic review tool, if applicable. No additional management support is needed unless otherwise documented below in the visit note. 

## 2015-01-22 NOTE — Patient Instructions (Signed)
You have some anxiety recently but overall you state you are much improved. Follow up with psychiatrist but if your anxiety flares call him sooner.  Your abdomen pain, dizziness and other symptoms resolved with cutting back on caffeine. Your labs were negative. If recurrent symptoms please let us know.  Follow up as regularly scheduled with pcp or as needed.

## 2015-01-22 NOTE — Progress Notes (Signed)
Subjective:    Patient ID: Margaret [name], female    DOB: 1950/09/09, 64 y.o.   MRN: [id number]  HPI  Pt in for follow up. She states that over all feels a lot better. She thinks she may have had anxiety and stress. Pt states she was drinking 1 cup of coffee. Also 2-3 bottle of large bottles. Yesterday when she started caffeine free coke she feels a lot better. Basically states everything resolved now except mild anxiety. She has history of this. She psychiatrist.  Pt states dizziness resolved and abdomen pain as well. Pt never took any of the medication that I wrote.    Review of Systems  Constitutional: Negative for fever, chills and fatigue.  Respiratory: Negative for cough and wheezing.   Cardiovascular: Negative for chest pain and palpitations.  Gastrointestinal: Negative for nausea, abdominal pain and diarrhea.       Resolved.  Skin: Negative for rash.  Neurological: Negative for dizziness, weakness, numbness and headaches.  Hematological: Negative for adenopathy. Does not bruise/bleed easily.  Psychiatric/Behavioral: Negative for confusion and agitation. The patient is nervous/anxious.        Some low grade stress/anxiety presently.    Past Medical History  Diagnosis Date  . Bipolar disorder ([place])   . PTSD (post-traumatic stress disorder)   . Thyroid disease   . Hypoglycemia   . Other malaise and fatigue 09/19/2012  . Hyperglycemia 09/19/2012  . Esophageal reflux 09/19/2012  . Cerumen impaction 12/26/2012  . Renal insufficiency 12/26/2012  . Diarrhea 12/26/2012  . Arthritis 02/14/2013  . Kidney stone 11/05/2014    Social History   Social History  . Marital Status: Divorced    Spouse Name: N/A  . Number of Children: N/A  . Years of Education: N/A   Occupational History  . Not on file.   Social History Main Topics  . Smoking status: Former Research scientist (life sciences)  . Smokeless tobacco: Never Used  . Alcohol Use: No  . Drug Use: No  . Sexual Activity: Not on file   Other Topics  Concern  . Not on file   Social History Narrative    Past Surgical History  Procedure Laterality Date  . Tubal ligation      reversal    Family History  Problem Relation Age of Onset  . Breast cancer Neg Hx   . Colon cancer Neg Hx   . Prostate cancer Father   . Diabetes Mother     Brother 1 of 2  . Heart failure Mother   . Hypertension Brother   . Esophageal cancer Maternal Grandmother   . Cancer Paternal Grandmother     cancer of jawbone 1 of 2  . Melanoma Brother   . Parkinsonism Father     deceased    Allergies  Allergen Reactions  . Codeine Nausea Only  . Darvon     Hallucinations   . Sulfa Drugs Cross Reactors Nausea And Vomiting    Current Outpatient Prescriptions on File Prior to Visit  Medication Sig Dispense Refill  . amitriptyline (ELAVIL) 50 MG tablet TAKE 1 TABLET (50 MG TOTAL) BY MOUTH AT BEDTIME. 30 tablet 2  . carbamazepine (CARBATROL) 200 MG 12 hr capsule TAKE 1 CAPSULE (200 MG TOTAL) BY MOUTH 2 (TWO) TIMES DAILY. 60 capsule 2  . Clobetasol Propionate 0.05 % lotion Apply 1 application topically 2 (two) times daily. 118 mL 1  . levothyroxine (SYNTHROID, LEVOTHROID) 100 MCG tablet TAKE 1 TABLET BY MOUTH DAILY BEFORE BREAKFAST. 30 tablet  8  . meclizine (ANTIVERT) 12.5 MG tablet Take 1 tablet (12.5 mg total) by mouth 3 (three) times daily as needed for dizziness. 30 tablet 0  . mometasone (ELOCON) 0.1 % cream Apply 1 application topically daily. 50 g 1  . ondansetron (ZOFRAN) 4 MG tablet Take 1 tablet (4 mg total) by mouth every 8 (eight) hours as needed for nausea or vomiting. 20 tablet 0  . ranitidine (ZANTAC) 150 MG capsule Take 1 capsule (150 mg total) by mouth 2 (two) times daily. 60 capsule 0  . risperiDONE (RISPERDAL) 2 MG tablet TAKE 1 TABLET (2 MG TOTAL) BY MOUTH AT BEDTIME. 30 tablet 2   No current facility-administered medications on file prior to visit.    BP 118/76 mmHg  Pulse 81  Temp(Src) 98.1 F (36.7 C) (Oral)  Ht 5\' 5"  (1.651 m)   Wt 196 lb 12.8 oz (89.268 kg)  BMI 32.75 kg/m2  SpO2 97%       Objective:   Physical Exam  General Mental Status- Alert. General Appearance- Not in acute distress.   Skin General: Color- Normal Color. Moisture- Normal Moisture.    Chest and Lung Exam Auscultation: Breath Sounds:-Normal. CTA.  Cardiovascular Auscultation:Rythm- Regular,Rate, and rhythm/ Murmurs & Other Heart Sounds:Auscultation of the heart reveals- No Murmurs.  Abdomen Inspection:-Inspeection Normal. Palpation/Percussion:Note:No mass. Palpation and Percussion of the abdomen reveal- Non Tender, Non Distended + BS, no rebound or guarding.    Neurologic Cranial Nerve exam:- CN III-XII intact(No nystagmus), symmetric smile. Strength:- 5/5 equal and symmetric strength both upper and lower extremities.      Assessment & Plan:  You have some anxiety recently but overall you state you are much improved. Follow up with psychiatrist but if your anxiety flares call him sooner.  Your abdomen pain, dizziness and other symptoms resolved with cutting back on caffeine. Your labs were negative. If recurrent symptoms please let us know.  Follow up as regularly scheduled with pcp or as needed.

## 2015-01-24 ENCOUNTER — Encounter: Payer: Self-pay | Admitting: Medical

## 2015-01-24 ENCOUNTER — Telehealth: Payer: Self-pay | Admitting: *Deleted

## 2015-01-24 NOTE — Telephone Encounter (Signed)
I printed letter explaining dates and abscences. Pt will pick up on day she come in for office visit to discuss [employer] form and how she is doing from prior illness.

## 2015-01-24 NOTE — Telephone Encounter (Signed)
Pt returned call. She states she has missed a total of 6 days from the recent illness she saw Percell Miller for. She states she will send a MyChart message with the exact dates. JG//CMA

## 2015-01-24 NOTE — Telephone Encounter (Signed)
FMLA forms received via fax from [employer]. Not sure what condition forms are for. Called and lm for pt to please return call. When pt returns call, please verify what medical condition the forms are for. Thanks, JG//CMA

## 2015-01-31 NOTE — Telephone Encounter (Signed)
Forms forwarded to [facility]. JG//CMA

## 2015-02-01 ENCOUNTER — Ambulatory Visit (HOSPITAL_COMMUNITY)
Admission: RE | Admit: 2015-02-01 | Discharge: 2015-02-01 | Disposition: A | Discharge status: Home / Self Care | Payer: 59 | Source: Ambulatory Visit | Attending: Family Medicine | Admitting: Family Medicine

## 2015-02-01 ENCOUNTER — Ambulatory Visit (INDEPENDENT_AMBULATORY_CARE_PROVIDER_SITE_OTHER): Payer: 59

## 2015-02-01 ENCOUNTER — Ambulatory Visit (INDEPENDENT_AMBULATORY_CARE_PROVIDER_SITE_OTHER): Payer: 59 | Admitting: Family Medicine

## 2015-02-01 VITALS — BP 150/90 | HR 90 | Temp 98.1°F | Resp 18

## 2015-02-01 DIAGNOSIS — R8271 Bacteriuria: Secondary | ICD-10-CM | POA: Diagnosis not present

## 2015-02-01 DIAGNOSIS — I708 Atherosclerosis of other arteries: Secondary | ICD-10-CM | POA: Diagnosis not present

## 2015-02-01 DIAGNOSIS — N1330 Unspecified hydronephrosis: Secondary | ICD-10-CM | POA: Diagnosis not present

## 2015-02-01 DIAGNOSIS — R1032 Left lower quadrant pain: Secondary | ICD-10-CM

## 2015-02-01 LAB — POCT URINALYSIS DIP (MANUAL ENTRY)
BILIRUBIN UA: NEGATIVE
Bilirubin, UA: NEGATIVE
Glucose, UA: NEGATIVE
Nitrite, UA: NEGATIVE
PH UA: 6.5
PROTEIN UA: NEGATIVE
RBC UA: NEGATIVE
SPEC GRAV UA: 1.01
Urobilinogen, UA: 0.2

## 2015-02-01 LAB — COMPREHENSIVE METABOLIC PANEL
ALBUMIN: 4.3 g/dL (ref 3.6–5.1)
ALT: 11 U/L (ref 6–29)
AST: 11 U/L (ref 10–35)
Alkaline Phosphatase: 114 U/L (ref 33–130)
BUN: 21 mg/dL (ref 7–25)
CALCIUM: 9.6 mg/dL (ref 8.6–10.4)
CHLORIDE: 105 mmol/L (ref 98–110)
CO2: 25 mmol/L (ref 20–31)
Creat: 1.04 mg/dL — ABNORMAL HIGH (ref 0.50–0.99)
GLUCOSE: 107 mg/dL — AB (ref 65–99)
POTASSIUM: 4.4 mmol/L (ref 3.5–5.3)
Sodium: 140 mmol/L (ref 135–146)
Total Bilirubin: 0.2 mg/dL (ref 0.2–1.2)
Total Protein: 6.6 g/dL (ref 6.1–8.1)

## 2015-02-01 LAB — POC MICROSCOPIC URINALYSIS (UMFC): MUCUS RE: ABSENT

## 2015-02-01 LAB — POCT CBC
GRANULOCYTE PERCENT: 67.4 % (ref 37–80)
HEMATOCRIT: 39.9 % (ref 37.7–47.9)
HEMOGLOBIN: 14.3 g/dL (ref 12.2–16.2)
LYMPH, POC: 1.3 (ref 0.6–3.4)
MCH, POC: 33.1 pg — AB (ref 27–31.2)
MCHC: 35.8 g/dL — AB (ref 31.8–35.4)
MCV: 92.4 fL (ref 80–97)
MID (cbc): 0.5 (ref 0–0.9)
MPV: 5.8 fL (ref 0–99.8)
POC GRANULOCYTE: 3.8 (ref 2–6.9)
POC LYMPH PERCENT: 23.1 %L (ref 10–50)
POC MID %: 9.5 %M (ref 0–12)
Platelet Count, POC: 233 10*3/uL (ref 142–424)
RBC: 4.32 M/uL (ref 4.04–5.48)
RDW, POC: 13.2 %
WBC: 5.7 10*3/uL (ref 4.6–10.2)

## 2015-02-01 MED ORDER — IOHEXOL 300 MG/ML  SOLN
100.0000 mL | Freq: Once | INTRAMUSCULAR | Status: AC | PRN
  Administered 2015-02-01: 100 mL via INTRAVENOUS

## 2015-02-01 MED ORDER — LEVOFLOXACIN 500 MG PO TABS: 500.0000 mg | ORAL_TABLET | Freq: Every day | ORAL | Status: DC

## 2015-02-01 NOTE — Patient Instructions (Signed)
I will talk to you on the phone after your CT scan.  We will then make a final plan for treatment- if no evidence of diverticulitis will start abx for a kidney infection.  If any evidence of diverticulitis we will use antibiotics for that instead.    Please proceed to [facility]- the radiology department on the first floor- to get your CT scan

## 2015-02-01 NOTE — Progress Notes (Addendum)
[facility] [address] [place] [address] [phone]  Date:  02/01/2015   Name:  Margaret [name]   DOB:  [birth year]   MRN:  [id number]  PCP:  Penni Homans, MD    Chief Complaint: Abdominal Pain; Nausea; Emesis; Fatigue; and Depression   History of Present Illness:  Margaret [name] is a 64 y.o. very pleasant female patient who presents with the following:  About 2 weeks ago she awoke with vomiting and was in bed with aches and pains for a couple of days She visited her PCP on 12/12 and had labs, h pylori test- all looked fine per her report   She states that she has been feeling "weak, dizzy, depressed, just not quite right with my head."  She feels that her psychiatric medications may need adjustmet.    Last night she noted some lower flank pain- this is now in her LLQ.   She was worried that this might indicate appendicitis so she came in to be seen  She has had a presumed kidney stone once back in January of this year- see CT scan.  She is not sure if this mgiht be the same sort of pain.   No history of diverticulitis.   She did have a BTL and then this was reversed.  No other history of abd operations  No vomiting but she is nauseated.   She did eat today- her appetite is ok  No dysuria or hematuria.    Her Psychiastrist is Dr. Adele Schilder.    Her BP is normally ok and she is not on BP medication.  Admits that she is feeling pretty worried and scared   BP Readings from Last 3 Encounters:  02/01/15 190/100  01/22/15 118/76  01/18/15 126/80     Patient Active Problem List   Diagnosis Date Noted  . Kidney stone 11/05/2014  . Alopecia 11/07/2013  . 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 Urinary problem 05/30/2013  . Tinea pedis 03/07/2013  . Arthritis 02/14/2013  . Cerumen impaction 12/26/2012  . Renal insufficiency 12/26/2012  . Diarrhea 12/26/2012  . Other malaise and fatigue 09/19/2012  . Hyperglycemia 09/19/2012  . Esophageal reflux  09/19/2012  . Cervical cancer screening 01/05/2012  . Borderline hyperlipidemia 01/05/2012  . Hypothyroidism 07/27/2011  . Bipolar disorder ([place]) 07/27/2011    Past Medical History  Diagnosis Date  . Bipolar disorder ([place])   . PTSD (post-traumatic stress disorder)   . Thyroid disease   . Hypoglycemia   . Other malaise and fatigue 09/19/2012  . Hyperglycemia 09/19/2012  . Esophageal reflux 09/19/2012  . Cerumen impaction 12/26/2012  . Renal insufficiency 12/26/2012  . Diarrhea 12/26/2012  . Arthritis 02/14/2013  . Kidney stone 11/05/2014    Past Surgical History  Procedure Laterality Date  . Tubal ligation      reversal    Social History  Substance Use Topics  . Smoking status: Former Research scientist (life sciences)  . Smokeless tobacco: Never Used  . Alcohol Use: No    Family History  Problem Relation Age of Onset  . Breast cancer Neg Hx   . Colon cancer Neg Hx   . Prostate cancer Father   . Diabetes Mother     Brother 1 of 2  . Heart failure Mother   . Hypertension Brother   . Esophageal cancer Maternal Grandmother   . Cancer Paternal Grandmother     cancer of jawbone 1 of 2  . Melanoma Brother   .  Parkinsonism Father     deceased    Allergies  Allergen Reactions  . Codeine Nausea Only  . Darvon     Hallucinations   . Sulfa Drugs Cross Reactors Nausea And Vomiting    Medication list has been reviewed and updated.  Current Outpatient Prescriptions on File Prior to Visit  Medication Sig Dispense Refill  . amitriptyline (ELAVIL) 50 MG tablet TAKE 1 TABLET (50 MG TOTAL) BY MOUTH AT BEDTIME. 30 tablet 2  . carbamazepine (CARBATROL) 200 MG 12 hr capsule TAKE 1 CAPSULE (200 MG TOTAL) BY MOUTH 2 (TWO) TIMES DAILY. 60 capsule 2  . levothyroxine (SYNTHROID, LEVOTHROID) 100 MCG tablet TAKE 1 TABLET BY MOUTH DAILY BEFORE BREAKFAST. 30 tablet 8  . risperiDONE (RISPERDAL) 2 MG tablet TAKE 1 TABLET (2 MG TOTAL) BY MOUTH AT BEDTIME. 30 tablet 2  . Clobetasol Propionate 0.05 % lotion Apply 1  application topically 2 (two) times daily. (Patient not taking: Reported on 02/01/2015) 118 mL 1  . meclizine (ANTIVERT) 12.5 MG tablet Take 1 tablet (12.5 mg total) by mouth 3 (three) times daily as needed for dizziness. (Patient not taking: Reported on 02/01/2015) 30 tablet 0  . mometasone (ELOCON) 0.1 % cream Apply 1 application topically daily. (Patient not taking: Reported on 02/01/2015) 50 g 1  . ondansetron (ZOFRAN) 4 MG tablet Take 1 tablet (4 mg total) by mouth every 8 (eight) hours as needed for nausea or vomiting. (Patient not taking: Reported on 02/01/2015) 20 tablet 0  . ranitidine (ZANTAC) 150 MG capsule Take 1 capsule (150 mg total) by mouth 2 (two) times daily. (Patient not taking: Reported on 02/01/2015) 60 capsule 0   No current facility-administered medications on file prior to visit.    Review of Systems:  As per HPI- otherwise negative.   Physical Examination: Filed Vitals:   02/01/15 1409  BP: 190/100  Pulse: 117  Temp: 98.1 F (36.7 C)  Resp: 18   There were no vitals filed for this visit. There is no weight on file to calculate BMI. Ideal Body Weight:    GEN: WDWN, NAD, Non-toxic, A & O x 3, overweight, tearful HEENT: Atraumatic, Normocephalic. Neck supple. No masses, No LAD. Ears and Nose: No external deformity. CV: RRR, No M/G/R. No JVD. No thrill. No extra heart sounds. PULM: CTA B, no wheezes, crackles, rhonchi. No retractions. No resp. distress. No accessory muscle use. ABD: S, NT, ND, +BS. No rebound. No HSM.  On original exam she did not have any reproducible tenderness in her left flank or anywhere in her abd.  However on repeat exam after labs and x-ray noted to have LLQ tenderness EXTR: No c/c/e NEURO Normal gait.  PSYCH: Normally interactive. Conversant. Not depressed or anxious appearing.  Calm demeanor.   [facility] reading (PRIMARY) by  Dr. Lorelei Pont. KUB: negative  ABDOMEN - 1 VIEW  COMPARISON: CT abdomen and pelvis February 15, 2014  FINDINGS: There is moderate stool throughout the colon. There is no bowel dilatation or air-fluid level suggesting obstruction. No free air. There are phleboliths in the pelvis.  IMPRESSION: Moderate stool throughout colon. No obstruction or free air is apparent on this supine examination.   Results for orders placed or performed in visit on 02/01/15  POCT CBC  Result Value Ref Range   WBC 5.7 4.6 - 10.2 K/uL   Lymph, poc 1.3 0.6 - 3.4   POC LYMPH PERCENT 23.1 10 - 50 %L   MID (cbc) 0.5 0 - 0.9  POC MID % 9.5 0 - 12 %M   POC Granulocyte 3.8 2 - 6.9   Granulocyte percent 67.4 37 - 80 %G   RBC 4.32 4.04 - 5.48 M/uL   Hemoglobin 14.3 12.2 - 16.2 g/dL   HCT, POC 39.9 37.7 - 47.9 %   MCV 92.4 80 - 97 fL   MCH, POC 33.1 (A) 27 - 31.2 pg   MCHC 35.8 (A) 31.8 - 35.4 g/dL   RDW, POC 13.2 %   Platelet Count, POC 233 142 - 424 K/uL   MPV 5.8 0 - 99.8 fL  POCT urinalysis dipstick  Result Value Ref Range   Color, UA yellow yellow   Clarity, UA cloudy (A) clear   Glucose, UA negative negative   Bilirubin, UA negative negative   Ketones, POC UA negative negative   Spec Grav, UA 1.010    Blood, UA negative negative   pH, UA 6.5    Protein Ur, POC negative negative   Urobilinogen, UA 0.2    Nitrite, UA Negative Negative   Leukocytes, UA moderate (2+) (A) Negative  POCT Microscopic Urinalysis ([facility])  Result Value Ref Range   WBC,UR,HPF,POC Moderate (A) None WBC/hpf   RBC,UR,HPF,POC None None RBC/hpf   Bacteria Many (A) None, Too numerous to count   Mucus Absent Absent   Epithelial Cells, UR Per Microscopy Few (A) None, Too numerous to count cells/hpf    Assessment and Plan: LLQ pain - Plan: POCT CBC, POCT urinalysis dipstick, POCT Microscopic Urinalysis ([facility]), Comprehensive metabolic panel, Urine culture, DG Abd 1 View, CT Abdomen Pelvis W Contrast  Bacteria in urine - Plan: levofloxacin (LEVAQUIN) 500 MG tablet  Hydronephrosis, unspecified hydronephrosis type - Plan:  Ambulatory referral to Urology   Here today with left lower quadrant pain suggestive or diverticulitis.   Also consider pyelonephritis as cause of pain.  She is willing to proceed with a CT scan.  Will go for this now  Signed Lamar Blinks, MD  Received her CT report as below and called pt around 6:15 pm.  No diverticulitis or appendicitis.  She does not show a stone but it appears that she may have a ureteral stricture causing hydronephrosis and thus pain.   Will treat with levaquin while we await her urine culture.  She will let me know if getting worse and will refer to urology to further evaluate   COMPARISON: CT abdomen and pelvis 02/15/2014.  FINDINGS: Lung bases demonstrate mild dependent atelectatic change. No pleural or pericardial effusion. Heart size is normal.  There is moderate left hydronephrosis which is improved since the prior examination. No stone or other cause for this finding is identified. Question UPJ stricture. Multiple small low attenuating lesions are seen in both kidneys. Some of these cannot be definitively characterized but they all likely represent cysts.  The gallbladder, liver, spleen, adrenal glands and pancreas appear normal. Aortoiliac atherosclerosis without aneurysm is identified.  Uterus, adnexa and urinary bladder appear normal. The stomach, small and large bowel and appendix are unremarkable. There is no lymphadenopathy or fluid. No lytic or sclerotic bony lesion is identified.  IMPRESSION: Moderate left hydronephrosis is improved since the prior examination. No obstructing lesion is identified. Question UPJ stricture.  Aortoiliac atherosclerosis without aneurysm.  Received her urine culture on 12/24 and gave her a call- she reports that her pain is about the same.  Advised her that if she is not ok, please go to the ER so urology can see her and place a stent  if needed.  She feels that she does not need to do this, but I will call  urology on Monday am and get her an appt asap

## 2015-02-02 ENCOUNTER — Telehealth: Payer: Self-pay | Admitting: Family Medicine

## 2015-02-02 LAB — URINE CULTURE: Colony Count: 4000

## 2015-02-02 NOTE — Telephone Encounter (Signed)
Caller name:[place] Relationship to patient:self Can be reached:[phone] Pharmacy:  Reason for call:She dropped off her FMLA paperwork  Form filled out and packet in basket for [employer]

## 2015-02-02 NOTE — Telephone Encounter (Signed)
Forms previously received and are currently with Percell Miller. JG//CMA

## 2015-02-06 ENCOUNTER — Telehealth: Payer: Self-pay | Admitting: Family Medicine

## 2015-02-06 ENCOUNTER — Telehealth: Payer: Self-pay

## 2015-02-06 DIAGNOSIS — N1330 Unspecified hydronephrosis: Secondary | ICD-10-CM

## 2015-02-06 NOTE — Telephone Encounter (Signed)
I see that pt wants to talk with me [employer] form. But I also saw note she was seen for abdomen pain and her note mentioned her mood not best recently. . Would you call her and ask for her to come in on Friday. Please give her a 30 minute slot. There often questions on form to be filled out and knowing how she is right now/on Friday  big help in filling out form.

## 2015-02-06 NOTE — Telephone Encounter (Signed)
Patient would like to speak with you regarding her FMLA paperwork.

## 2015-02-06 NOTE — Telephone Encounter (Signed)
Called patient regarding appointment. States she will not be able to come in on Friday because her day off is on Monday. States she needs for [facility] to change her FMLA forms to Kidney injury and Concussion. Says she will be seeing a Urologist from this point on. Adviused patient I would inform you.

## 2015-02-06 NOTE — Telephone Encounter (Signed)
Pt called and would like a call back from Dr Lorelei Pont, states she would like to go somewhere else to be seen except [facility], adult education. Please call [phone]

## 2015-02-06 NOTE — Telephone Encounter (Signed)
Please Advise/SLS Note forwarded to provider, as forms were forwarded on 01/31/15, as noted below/SLS

## 2015-02-06 NOTE — Telephone Encounter (Signed)
Robin from [facility] wanted Dr Lorelei Pont to know they were able to get pt in on Friday to see them but she declined, states she can go on Monday but they don't have anything available then but if they Have a cancellation will give pt a call   [place], you may reach [facility] at [phone] option [phone] if needed

## 2015-02-06 NOTE — Telephone Encounter (Signed)
[facility] [place] [facility]- after 45 minutes on hold was able to reach a triage nurse who stated she would call the pt and get her in asap for a visit/   Medical records- please fax my last note.  Thank you!

## 2015-02-07 NOTE — Telephone Encounter (Signed)
Will forward to [place] as he has been dealing with this

## 2015-02-07 NOTE — Telephone Encounter (Signed)
Edward please see note below and advise.

## 2015-02-08 NOTE — Telephone Encounter (Signed)
Spoke with pt and she will come in on 02/19/15 to have paperwork filled out.

## 2015-02-12 NOTE — Telephone Encounter (Signed)
This is typed summary of Part D addendum to [employer] form.  I saw pt on 01-18-2015 for dizziness, abdomen pain, nausea, and loose stools. I then saw pt back on 01-24-2015. Her symptoms seem to resolve by cutting back on caffeine. She was later seen by other provider on 02-01-2015. She had work up that showed moderate hydronephrosis. She told me she will see urologist for this. Also mentioned to that in November before all the above started that she fell and land her left side hitting her head. She seemed to think dizziness may have been related to possible concussion. She told me on the phone that she had 3 prior concussion before in her life. I asked her to come in to discuss her fall at end of November and her recent hydronephrosis but she stated she could not miss another day of work. Pt expressed also that she needed the [employer] form filled out ASAP. So I fill out forms based on 2 visits with patient and phone conversation. Will defer work up on hydronephrosis to urologist. If pt is having any further dizziness and she thinks related to concussion will recommend/refer to neurologist.

## 2015-02-13 NOTE — Telephone Encounter (Signed)
As previously stated by Janett Billow on 01/31/15:   Chaya Jan, CMA at 01/31/2015 10:34 AM     Status: Signed       Expand All Collapse All   Forms forwarded to [facility]. JG//CMA         Please inform the patient that the provider has not returned the completed forms to [place] and is not in the office today, I once again will forward this message to Percell Miller to make him aware that patient is needing an answer ASAP/SLS Thanks.

## 2015-02-13 NOTE — Telephone Encounter (Signed)
Forms were found in Margaret [name]'s office, they are completed and being faxed at this time; please inform patient/SLS

## 2015-02-13 NOTE — Telephone Encounter (Signed)
Pt was notified and she voices understanding and is grateful for all the help we have been in the process.//HSM

## 2015-02-13 NOTE — Telephone Encounter (Signed)
Patient called checking on the status of paperwork, patient states paperwork is due today and its been more then 5 to 7 business day turnaround. Please advise

## 2015-02-19 ENCOUNTER — Ambulatory Visit: Payer: Self-pay | Admitting: Medical

## 2015-02-20 MED FILL — risperiDONE 2 MG TABS: 2 | 30 days supply | Qty: 30 | Fill #2

## 2015-02-20 MED FILL — AMITRIPTYLINE HCL 50 MG TAB: 50 | 30 days supply | Qty: 30 | Fill #2

## 2015-02-20 MED FILL — CARBAMAZEPINE ER 200 MG CAP: 200 | 30 days supply | Qty: 60 | Fill #2

## 2015-02-22 ENCOUNTER — Other Ambulatory Visit: Payer: Self-pay | Admitting: Urology

## 2015-02-22 DIAGNOSIS — Z0000 Encounter for general adult medical examination without abnormal findings: Secondary | ICD-10-CM | POA: Diagnosis not present

## 2015-02-22 DIAGNOSIS — N135 Crossing vessel and stricture of ureter without hydronephrosis: Secondary | ICD-10-CM

## 2015-02-22 DIAGNOSIS — Q6211 Congenital occlusion of ureteropelvic junction: Secondary | ICD-10-CM | POA: Diagnosis not present

## 2015-02-22 DIAGNOSIS — N12 Tubulo-interstitial nephritis, not specified as acute or chronic: Secondary | ICD-10-CM | POA: Diagnosis not present

## 2015-03-05 ENCOUNTER — Ambulatory Visit (HOSPITAL_COMMUNITY): Payer: 59

## 2015-03-07 ENCOUNTER — Encounter: Payer: Self-pay | Admitting: Family Medicine

## 2015-03-07 DIAGNOSIS — N201 Calculus of ureter: Secondary | ICD-10-CM | POA: Insufficient documentation

## 2015-03-12 MED FILL — LEVOTHYROXINE 100 MCG TAB: 100 | 30 days supply | Qty: 30 | Fill #4

## 2015-03-26 ENCOUNTER — Encounter (HOSPITAL_COMMUNITY): Payer: Self-pay | Admitting: Psychiatry

## 2015-03-26 ENCOUNTER — Ambulatory Visit (INDEPENDENT_AMBULATORY_CARE_PROVIDER_SITE_OTHER): Payer: 59 | Admitting: Psychiatry

## 2015-03-26 ENCOUNTER — Other Ambulatory Visit (HOSPITAL_COMMUNITY): Payer: Self-pay | Admitting: Psychiatry

## 2015-03-26 VITALS — BP 140/90 | HR 72 | Ht 67.0 in | Wt 197.4 lb

## 2015-03-26 DIAGNOSIS — F3110 Bipolar disorder, current episode manic without psychotic features, unspecified: Secondary | ICD-10-CM | POA: Diagnosis not present

## 2015-03-26 DIAGNOSIS — Z79899 Other long term (current) drug therapy: Secondary | ICD-10-CM

## 2015-03-26 MED ORDER — RISPERIDONE 2 MG PO TABS: ORAL_TABLET | ORAL | Status: DC

## 2015-03-26 MED ORDER — AMITRIPTYLINE HCL 50 MG PO TABS: ORAL_TABLET | ORAL | Status: DC

## 2015-03-26 MED ORDER — CARBAMAZEPINE ER 200 MG PO CP12: ORAL_CAPSULE | ORAL | Status: DC

## 2015-03-26 MED FILL — AMITRIPTYLINE HCL 50 MG TAB: 50 | 30 days supply | Qty: 30 | Fill #0

## 2015-03-26 MED FILL — CARBAMAZEPINE ER 200 MG CAP: 200 | 29 days supply | Qty: 59 | Fill #0

## 2015-03-26 MED FILL — risperiDONE 2 MG TABS: 2 | 30 days supply | Qty: 30 | Fill #0

## 2015-03-26 NOTE — Progress Notes (Signed)
[facility] [phone] Progress Note  Margaret [name] [id number] 65 y.o.  03/26/2015 9:56 AM  Chief Complaint: I fell after Thanksgiving and had a concussion .  But I'm doing much better.  I'm sleeping good.  I had a good Christmas.     History of Present Illness: Margaret [name] came for her followup appointment .  She endorse around Thanksgiving , she fell and had a concussion but now she is feeling much better.  Initially she had headaches and insomnia which she avoid taking any medication.  She is not sure what happened but remember trying to installed something on the wall and fell.  She is taking her medication as prescribed.  She is seeing primary care physician for physical and checkup.  While checkup she was found that she has hydronephrosis and 1 kidney and she is scheduled to have more test and first week of March.  I review her blood work.  Her WBC count is 5.7, Her creatinine is 1.04 .  Patient is taking Risperdal, amitriptyline and Tegretol.  She has no side effects.  She denies any tremors or shakes.  She endorsed job is stressful but denies any agitation, irritability, anger, crying spells or any manic symptoms.  She sleeping good.  She is not taking any over-the-counter.  Her energy level is good.  She denies any paranoia or any feeling of hopelessness or worthlessness. She lives with HER-2 dogs.  She has no children.  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  Patient denies drinking or using any illegal substances.    Suicidal Ideation: No Plan Formed: No Patient has means to carry out plan: No  Homicidal Ideation: No Plan Formed: No Patient has means to carry out plan: No  Review of Systems  Constitutional: Negative.   Musculoskeletal: Positive for joint pain.  Neurological: Negative for tingling and tremors.  Psychiatric/Behavioral: Negative for depression, suicidal ideas, hallucinations and substance abuse.   Psychiatric: Agitation:  No Hallucination: No Depressed Mood: No Insomnia: No Hypersomnia: No Altered Concentration: No Feels Worthless: No Grandiose Ideas: No Belief In Special Powers: No New/Increased Substance Abuse: No Compulsions: No  Neurologic: Headache: No Seizure: No Paresthesias: No  Medical History:  Her primary care physician is Penni Homans. Patient has a history of bladder problems, obesity and chronic back pain.  Outpatient Encounter Prescriptions as of 03/26/2015  Medication Sig  . amitriptyline (ELAVIL) 50 MG tablet TAKE 1 TABLET (50 MG TOTAL) BY MOUTH AT BEDTIME.  . carbamazepine (CARBATROL) 200 MG 12 hr capsule TAKE 1 CAPSULE (200 MG TOTAL) BY MOUTH 2 (TWO) TIMES DAILY.  . Clobetasol Propionate 0.05 % lotion Apply 1 application topically 2 (two) times daily. (Patient not taking: Reported on 02/01/2015)  . levothyroxine (SYNTHROID, LEVOTHROID) 100 MCG tablet TAKE 1 TABLET BY MOUTH DAILY BEFORE BREAKFAST.  Marland Kitchen risperiDONE (RISPERDAL) 2 MG tablet TAKE 1 TABLET (2 MG TOTAL) BY MOUTH AT BEDTIME.  . [DISCONTINUED] amitriptyline (ELAVIL) 50 MG tablet TAKE 1 TABLET (50 MG TOTAL) BY MOUTH AT BEDTIME.  . [DISCONTINUED] carbamazepine (CARBATROL) 200 MG 12 hr capsule TAKE 1 CAPSULE (200 MG TOTAL) BY MOUTH 2 (TWO) TIMES DAILY.  . [DISCONTINUED] levofloxacin (LEVAQUIN) 500 MG tablet Take 1 tablet (500 mg total) by mouth daily.  . [DISCONTINUED] risperiDONE (RISPERDAL) 2 MG tablet TAKE 1 TABLET (2 MG TOTAL) BY MOUTH AT BEDTIME.   No facility-administered encounter medications on file as of 03/26/2015.   Recent Results (from the past 2160 hour(s))  CBC with Differential/Platelet     Status: Abnormal   Collection Time: 01/18/15 12:43 PM  Result Value Ref Range   WBC 7.5 4.0 - 10.5 K/uL   RBC 4.51 3.87 - 5.11 Mil/uL   Hemoglobin 14.4 12.0 - 15.0 g/dL   HCT 42.8 36.0 - 46.0 %   MCV 94.7 78.0 - 100.0 fl   MCHC 33.6 30.0 - 36.0 g/dL   RDW 13.0 11.5 - 15.5 %   Platelets 269.0 150.0 - 400.0 K/uL    Neutrophils Relative % 58.9 43.0 - 77.0 %   Lymphocytes Relative 23.4 12.0 - 46.0 %   Monocytes Relative 10.7 3.0 - 12.0 %   Eosinophils Relative 6.5 (H) 0.0 - 5.0 %   Basophils Relative 0.5 0.0 - 3.0 %   Neutro Abs 4.4 1.4 - 7.7 K/uL   Lymphs Abs 1.8 0.7 - 4.0 K/uL   Monocytes Absolute 0.8 0.1 - 1.0 K/uL   Eosinophils Absolute 0.5 0.0 - 0.7 K/uL   Basophils Absolute 0.0 0.0 - 0.1 K/uL  Comprehensive metabolic panel     Status: None   Collection Time: 01/18/15 12:43 PM  Result Value Ref Range   Sodium 142 135 - 145 mEq/L   Potassium 4.0 3.5 - 5.1 mEq/L   Chloride 108 96 - 112 mEq/L   CO2 27 19 - 32 mEq/L   Glucose, Bld 85 70 - 99 mg/dL   BUN 12 6 - 23 mg/dL   Creatinine, Ser 0.82 0.40 - 1.20 mg/dL   Total Bilirubin 0.3 0.2 - 1.2 mg/dL   Alkaline Phosphatase 92 39 - 117 U/L   AST 12 0 - 37 U/L   ALT 17 0 - 35 U/L   Total Protein 6.4 6.0 - 8.3 g/dL   Albumin 4.0 3.5 - 5.2 g/dL   Calcium 9.3 8.4 - 10.5 mg/dL   GFR 74.52 >60.00 mL/min  Amylase     Status: None   Collection Time: 01/18/15 12:43 PM  Result Value Ref Range   Amylase 38 27 - 131 U/L  Lipase     Status: None   Collection Time: 01/18/15 12:43 PM  Result Value Ref Range   Lipase 30.0 11.0 - 59.0 U/L  H. pylori breath test     Status: None   Collection Time: 01/18/15 12:43 PM  Result Value Ref Range   H. pylori Breath Test NOT DETECTED Not Detected    Comment:   Antimicrobials, proton pump inhibitors, and bismuth preparations are known to suppress H. pylori, and ingestion of these prior to H. pylori diagnostic testing may lead to false negative results. If clinically indicated, the test may be repeated on a new specimen obtained two weeks after discontinuing treatment.     Comprehensive metabolic panel     Status: Abnormal   Collection Time: 02/01/15  2:56 PM  Result Value Ref Range   Sodium 140 135 - 146 mmol/L   Potassium 4.4 3.5 - 5.3 mmol/L   Chloride 105 98 - 110 mmol/L   CO2 25 20 - 31 mmol/L    Glucose, Bld 107 (H) 65 - 99 mg/dL   BUN 21 7 - 25 mg/dL   Creat 1.04 (H) 0.50 - 0.99 mg/dL   Total Bilirubin 0.2 0.2 - 1.2 mg/dL   Alkaline Phosphatase 114 33 - 130 U/L   AST 11 10 - 35 U/L   ALT 11 6 - 29 U/L   Total Protein 6.6 6.1 - 8.1 g/dL   Albumin 4.3 3.6 -  5.1 g/dL   Calcium 9.6 8.6 - 10.4 mg/dL  Urine culture     Status: None   Collection Time: 02/01/15  2:58 PM  Result Value Ref Range   Colony Count 4,000 COLONIES/ML    Organism ID, Bacteria Insignificant Growth   POCT CBC     Status: Abnormal   Collection Time: 02/01/15  2:59 PM  Result Value Ref Range   WBC 5.7 4.6 - 10.2 K/uL   Lymph, poc 1.3 0.6 - 3.4   POC LYMPH PERCENT 23.1 10 - 50 %L   MID (cbc) 0.5 0 - 0.9   POC MID % 9.5 0 - 12 %M   POC Granulocyte 3.8 2 - 6.9   Granulocyte percent 67.4 37 - 80 %G   RBC 4.32 4.04 - 5.48 M/uL   Hemoglobin 14.3 12.2 - 16.2 g/dL   HCT, POC 39.9 37.7 - 47.9 %   MCV 92.4 80 - 97 fL   MCH, POC 33.1 (A) 27 - 31.2 pg   MCHC 35.8 (A) 31.8 - 35.4 g/dL   RDW, POC 13.2 %   Platelet Count, POC 233 142 - 424 K/uL   MPV 5.8 0 - 99.8 fL  POCT urinalysis dipstick     Status: Abnormal   Collection Time: 02/01/15  2:59 PM  Result Value Ref Range   Color, UA yellow yellow   Clarity, UA cloudy (A) clear   Glucose, UA negative negative   Bilirubin, UA negative negative   Ketones, POC UA negative negative   Spec Grav, UA 1.010    Blood, UA negative negative   pH, UA 6.5    Protein Ur, POC negative negative   Urobilinogen, UA 0.2    Nitrite, UA Negative Negative   Leukocytes, UA moderate (2+) (A) Negative  POCT Microscopic Urinalysis ([facility])     Status: Abnormal   Collection Time: 02/01/15  2:59 PM  Result Value Ref Range   WBC,UR,HPF,POC Moderate (A) None WBC/hpf   RBC,UR,HPF,POC None None RBC/hpf   Bacteria Many (A) None, Too numerous to count   Mucus Absent Absent   Epithelial Cells, UR Per Microscopy Few (A) None, Too numerous to count cells/hpf   Past Psychiatric  History/Hospitalization(s): Anxiety: No Bipolar Disorder: Yes Depression: Yes Mania: Yes Psychosis: No Schizophrenia: No Personality Disorder: No Hospitalization for psychiatric illness: Yes History of Electroconvulsive Shock Therapy: No Prior Suicide Attempts: No  Physical Exam: Constitutional:  BP 140/90 mmHg  Pulse 72  Ht _0  (1.702 m)  Wt 197 lb 6.4 oz (89.54 kg)  BMI 30.91 kg/m2  General Appearance: alert, oriented, no acute distress, well nourished and obese  Musculoskeletal: Strength & Muscle Tone: within normal limits Gait & Station: normal Patient leans:  N/A  Mental status examination Patient is casually dressed and fairly groomed.  Her speech is fast but clear and coherent.  She describes her mood happy and her affect is appropriate.  She denies any auditory or visual hallucination.  She denies any active or passive suicidal thoughts or homicidal thoughts.  There was no paranoia or any delusions.  Her attention and concentration is fair.  She has no tremors or shakes.  Her thought processes logical and goal-directed.  Her psychomotor activity is normal.  Her fund of knowledge is good.  She is alert and oriented x3.  She has a cast on her left ankle and she requires stretches for walking.  Her insight judgment and impulse control is okay.  Established Problem, Stable/Improving (1), Review of  Psycho-Social Stressors (1), Review or order clinical lab tests (1), Review of Last Therapy Session (1) and Review of Medication Regimen & Side Effects (2)  Assessment: Axis I: Bipolar disorder  Axis II: Deferred  Axis III: Obesity and hypothyroidism   Plan:  I reviewed blood work results and collateral information.  Patient is taking her medication and denies any side effects including any tremors shakes or any EPS.  Discussed medication side effects and benefits.  She has renal workup coming next month.  I will continue Tegretol 200 mg twice a day, amitriptyline 50 mg at  bedtime and Risperdal 2 mg at bedtime.  We will do carbamazepine level .  Recommended to call us back if he has any question or any concern.  Follow-up in 3 months.  Darcell Yacoub T., MD 03/26/2015

## 2015-04-09 MED FILL — LEVOTHYROXINE 100 MCG TAB: 100 | 30 days supply | Qty: 30 | Fill #5

## 2015-04-16 ENCOUNTER — Ambulatory Visit (HOSPITAL_COMMUNITY): Payer: 59

## 2015-04-18 MED FILL — CARBAMAZEPINE ER 200 MG CAP: 200 | 30 days supply | Qty: 60 | Fill #1

## 2015-04-23 MED FILL — AMITRIPTYLINE HCL 50 MG TAB: 50 | 30 days supply | Qty: 30 | Fill #1

## 2015-04-23 MED FILL — risperiDONE 2 MG TABS: 2 | 30 days supply | Qty: 30 | Fill #1

## 2015-04-30 ENCOUNTER — Ambulatory Visit (HOSPITAL_COMMUNITY)
Admission: RE | Admit: 2015-04-30 | Discharge: 2015-04-30 | Disposition: A | Discharge status: Home / Self Care | Payer: 59 | Source: Ambulatory Visit | Attending: Urology | Admitting: Urology

## 2015-04-30 DIAGNOSIS — N135 Crossing vessel and stricture of ureter without hydronephrosis: Secondary | ICD-10-CM | POA: Diagnosis not present

## 2015-04-30 DIAGNOSIS — N1330 Unspecified hydronephrosis: Secondary | ICD-10-CM | POA: Diagnosis not present

## 2015-04-30 MED ORDER — FUROSEMIDE 10 MG/ML IJ SOLN
45.0000 mg | Freq: Once | INTRAMUSCULAR | Status: AC
  Administered 2015-04-30: 40 mg via INTRAVENOUS

## 2015-04-30 MED ORDER — FUROSEMIDE 10 MG/ML IJ SOLN
INTRAMUSCULAR | Status: AC
  Filled 2015-04-30: qty 4

## 2015-04-30 MED ORDER — TECHNETIUM TC 99M MERTIATIDE
16.2000 | Freq: Once | INTRAVENOUS | Status: AC | PRN
  Administered 2015-04-30: 16 via INTRAVENOUS

## 2015-05-08 MED FILL — LEVOTHYROXINE 100 MCG TAB: 100 | 30 days supply | Qty: 30 | Fill #6

## 2015-05-21 MED FILL — AMITRIPTYLINE HCL 50 MG TAB: 50 | 30 days supply | Qty: 30 | Fill #2

## 2015-05-21 MED FILL — risperiDONE 2 MG TABS: 2 | 30 days supply | Qty: 30 | Fill #2

## 2015-05-21 MED FILL — CARBAMAZEPINE ER 200 MG CAP: 200 | 30 days supply | Qty: 60 | Fill #2

## 2015-06-04 MED FILL — LEVOTHYROXINE 100 MCG TAB: 100 | 30 days supply | Qty: 30 | Fill #7

## 2015-06-18 ENCOUNTER — Ambulatory Visit (HOSPITAL_COMMUNITY): Payer: Self-pay | Admitting: Psychiatry

## 2015-06-18 DIAGNOSIS — F3110 Bipolar disorder, current episode manic without psychotic features, unspecified: Secondary | ICD-10-CM | POA: Diagnosis not present

## 2015-06-18 DIAGNOSIS — Z79899 Other long term (current) drug therapy: Secondary | ICD-10-CM | POA: Diagnosis not present

## 2015-06-19 LAB — CARBAMAZEPINE LEVEL, TOTAL: CARBAMAZEPINE LVL: 6.1 mg/L (ref 4.0–12.0)

## 2015-06-20 ENCOUNTER — Other Ambulatory Visit (HOSPITAL_COMMUNITY): Payer: Self-pay | Admitting: Psychiatry

## 2015-06-21 NOTE — Telephone Encounter (Signed)
Patient called and said she will be out of medication by this weekend, she has a follow up on 5/15 - okay to refill? Please review and advise, thank you

## 2015-06-25 ENCOUNTER — Encounter (HOSPITAL_COMMUNITY): Payer: Self-pay | Admitting: Psychiatry

## 2015-06-25 ENCOUNTER — Ambulatory Visit (INDEPENDENT_AMBULATORY_CARE_PROVIDER_SITE_OTHER): Payer: 59 | Admitting: Psychiatry

## 2015-06-25 VITALS — BP 124/82 | HR 96 | Wt 192.0 lb

## 2015-06-25 DIAGNOSIS — Z0000 Encounter for general adult medical examination without abnormal findings: Secondary | ICD-10-CM | POA: Diagnosis not present

## 2015-06-25 DIAGNOSIS — N1330 Unspecified hydronephrosis: Secondary | ICD-10-CM | POA: Diagnosis not present

## 2015-06-25 DIAGNOSIS — F3110 Bipolar disorder, current episode manic without psychotic features, unspecified: Secondary | ICD-10-CM

## 2015-06-25 DIAGNOSIS — Q6211 Congenital occlusion of ureteropelvic junction: Secondary | ICD-10-CM | POA: Diagnosis not present

## 2015-06-25 MED ORDER — RISPERIDONE 2 MG PO TABS: ORAL_TABLET | ORAL | Status: DC

## 2015-06-25 MED ORDER — CARBAMAZEPINE ER 200 MG PO CP12: ORAL_CAPSULE | ORAL | Status: DC

## 2015-06-25 MED ORDER — AMITRIPTYLINE HCL 50 MG PO TABS: ORAL_TABLET | ORAL | Status: DC

## 2015-06-25 MED FILL — CARBAMAZEPINE ER 200 MG CAP: 200 | 30 days supply | Qty: 60 | Fill #0

## 2015-06-25 MED FILL — risperiDONE 2 MG TABS: 2 | 30 days supply | Qty: 30 | Fill #0

## 2015-06-25 MED FILL — AMITRIPTYLINE HCL 50 MG TAB: 50 | 30 days supply | Qty: 30 | Fill #0

## 2015-06-25 NOTE — Progress Notes (Signed)
[name] Progress Note  Margaret [name] [id number] 65 y.o.  06/25/2015 2:11 PM  Chief Complaint: I was very stressed at I'm feeling better now.  I have to file bankruptcy .  I'm taking my medication.  I'm sleeping good.       History of Present Illness: Margaret [name] came for her followup appointment .  She endorse past 2 months she was under a lot of stress because she had to file bankruptcy.  She mentioned that she took early retirement and then she owe a lot of money which she need to return back.  After consulting with a lawyer she decided to file bankruptcy and she has no other choice.  She endorse once she file the bankruptcy she is feeling better.  She is more relaxed and calm.  She is sleeping better.  She is taking her medication as prescribed.  She denies any crying spells, irritability, anger or any mood swing.  She had Tegretol level which was 6.1.  She has no tremors shakes or any EPS.  She does not want to change her medication.  She denies any feeling of hopelessness or worthlessness.  She has no delusions or any self abusive behavior. She lives with Margaret [name] dogs.  She has no children.  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  Patient denies drinking or using any illegal substances.    Suicidal Ideation: No Plan Formed: No Patient has means to carry out plan: No  Homicidal Ideation: No Plan Formed: No Patient has means to carry out plan: No  Review of Systems  Constitutional: Negative.   Musculoskeletal: Positive for joint pain.  Neurological: Negative for tingling and tremors.  Psychiatric/Behavioral: Negative for depression, suicidal ideas, hallucinations and substance abuse.   Psychiatric: Agitation: No Hallucination: No Depressed Mood: No Insomnia: No Hypersomnia: No Altered Concentration: No Feels Worthless: No Grandiose Ideas: No Belief In Special Powers: No New/Increased Substance Abuse: No Compulsions:  No  Neurologic: Headache: No Seizure: No Paresthesias: No  Medical History:  Her primary care physician is Penni Homans. Patient has a history of bladder problems, obesity and chronic back pain.  Outpatient Encounter Prescriptions as of 06/25/2015  Medication Sig  . amitriptyline (ELAVIL) 50 MG tablet TAKE 1 TABLET (50 MG TOTAL) BY MOUTH AT BEDTIME.  . carbamazepine (CARBATROL) 200 MG 12 hr capsule TAKE 1 CAPSULE (200 MG TOTAL) BY MOUTH 2 (TWO) TIMES DAILY.  . Clobetasol Propionate 0.05 % lotion Apply 1 application topically 2 (two) times daily. (Patient not taking: Reported on 02/01/2015)  . levothyroxine (SYNTHROID, LEVOTHROID) 100 MCG tablet TAKE 1 TABLET BY MOUTH DAILY BEFORE BREAKFAST.  Marland Kitchen risperiDONE (RISPERDAL) 2 MG tablet TAKE 1 TABLET (2 MG TOTAL) BY MOUTH AT BEDTIME.  . [DISCONTINUED] amitriptyline (ELAVIL) 50 MG tablet TAKE 1 TABLET (50 MG TOTAL) BY MOUTH AT BEDTIME.  . [DISCONTINUED] carbamazepine (CARBATROL) 200 MG 12 hr capsule TAKE 1 CAPSULE (200 MG TOTAL) BY MOUTH 2 (TWO) TIMES DAILY.  . [DISCONTINUED] risperiDONE (RISPERDAL) 2 MG tablet TAKE 1 TABLET (2 MG TOTAL) BY MOUTH AT BEDTIME.   No facility-administered encounter medications on file as of 06/25/2015.   Recent Results (from the past 2160 hour(s))  Carbamazepine Level (Tegretol), total     Status: None   Collection Time: 06/18/15  8:59 AM  Result Value Ref Range   Carbamazepine Lvl 6.1 4.0 - 12.0 mg/L   Past Psychiatric History/Hospitalization(s): Anxiety: No Bipolar Disorder: Yes Depression: Yes Mania: Yes Psychosis: No  Schizophrenia: No Personality Disorder: No Hospitalization for psychiatric illness: Yes History of Electroconvulsive Shock Therapy: No Prior Suicide Attempts: No  Physical Exam: Constitutional:  BP 124/82 mmHg  Pulse 96  Wt 192 lb (87.091 kg)  General Appearance: alert, oriented, no acute distress, well nourished and obese  Musculoskeletal: Strength & Muscle Tone: within normal  limits Gait & Station: normal Patient leans:  N/A  Mental status examination Patient is casually dressed and fairly groomed.  Her speech is fast but clear and coherent.  She describes her mood euthymic and her affect is appropriate.  She denies any auditory or visual hallucination.  She denies any active or passive suicidal thoughts or homicidal thoughts.  There was no paranoia or any delusions.  Her attention and concentration is fair.  She has no tremors or shakes.  Her thought processes logical and goal-directed.  Her psychomotor activity is normal.  Her fund of knowledge is good.  She is alert and oriented x3.  She has a cast on her left ankle and she requires stretches for walking.  Her insight judgment and impulse control is okay.  Established Problem, Stable/Improving (1), Review of Psycho-Social Stressors (1), Review or order clinical lab tests (1), Review of Last Therapy Session (1) and Review of Medication Regimen & Side Effects (2)  Assessment: Axis I: Bipolar disorder  Axis II: Deferred  Axis III: Obesity and hypothyroidism   Plan:  I reviewed Tegretol level.  She has no side effects.  Patient is stable on her current psychiatric medication.  I will continue Tegretol 200 mg twice a day, amitriptyline 50 mg at bedtime and Risperdal 2 mg at bedtime.  Discussed medication side effects and benefits.  She denies any side effects including any tremors shakes or any EPS.  Discussed medication side effects and benefits.  Recommended to call us back if he has any question or any concern.  Follow-up in 3 months.  Lillyanna Glandon T., MD 06/25/2015

## 2015-07-02 DIAGNOSIS — L2084 Intrinsic (allergic) eczema: Secondary | ICD-10-CM | POA: Diagnosis not present

## 2015-07-02 MED FILL — CLOBETASOL 0.05% CREAM: 0.05 | 15 days supply | Qty: 60 | Fill #0

## 2015-07-06 MED FILL — LEVOTHYROXINE 100 MCG TAB: 100 | 30 days supply | Qty: 30 | Fill #8

## 2015-07-20 MED FILL — risperiDONE 2 MG TABS: 2 | 30 days supply | Qty: 30 | Fill #1

## 2015-07-20 MED FILL — AMITRIPTYLINE HCL 50 MG TAB: 50 | 30 days supply | Qty: 30 | Fill #1

## 2015-07-20 MED FILL — CARBAMAZEPINE ER 200 MG CAP: 200 | 30 days supply | Qty: 60 | Fill #1

## 2015-08-03 MED FILL — LEVOTHYROXINE 100 MCG TAB: 100 | 30 days supply | Qty: 30 | Fill #2

## 2015-08-21 MED FILL — CARBAMAZEPINE ER 200 MG CAP: 200 | 30 days supply | Qty: 60 | Fill #2

## 2015-08-21 MED FILL — risperiDONE 2 MG TABS: 2 | 30 days supply | Qty: 30 | Fill #2

## 2015-08-21 MED FILL — AMITRIPTYLINE HCL 50 MG TAB: 50 | 30 days supply | Qty: 30 | Fill #2

## 2015-09-06 MED FILL — LEVOTHYROXINE 100 MCG TAB: 100 | 30 days supply | Qty: 30 | Fill #3

## 2015-09-13 ENCOUNTER — Other Ambulatory Visit (HOSPITAL_COMMUNITY): Payer: Self-pay | Admitting: Psychiatry

## 2015-09-13 DIAGNOSIS — F3110 Bipolar disorder, current episode manic without psychotic features, unspecified: Secondary | ICD-10-CM

## 2015-09-13 MED FILL — CARBAMAZEPINE ER 200 MG CAP: 200 | 30 days supply | Qty: 60 | Fill #0

## 2015-09-13 MED FILL — AMITRIPTYLINE HCL 50 MG TAB: 50 | 30 days supply | Qty: 30 | Fill #0

## 2015-09-13 MED FILL — risperiDONE 2 MG TABS: 2 | 30 days supply | Qty: 30 | Fill #0

## 2015-09-24 ENCOUNTER — Ambulatory Visit (HOSPITAL_COMMUNITY): Payer: Self-pay | Admitting: Psychiatry

## 2015-10-04 ENCOUNTER — Other Ambulatory Visit: Payer: Self-pay | Admitting: Family Medicine

## 2015-10-05 MED FILL — LEVOTHYROXINE 100 MCG TAB: 100 | 30 days supply | Qty: 30 | Fill #0

## 2015-10-16 MED FILL — risperiDONE 2 MG TABS: 2 | 30 days supply | Qty: 30 | Fill #1

## 2015-10-16 MED FILL — CARBAMAZEPINE ER 200 MG CAP: 200 | 30 days supply | Qty: 60 | Fill #1

## 2015-10-16 MED FILL — AMITRIPTYLINE HCL 50 MG TAB: 50 | 30 days supply | Qty: 30 | Fill #1

## 2015-10-29 ENCOUNTER — Ambulatory Visit (HOSPITAL_COMMUNITY): Payer: Self-pay | Admitting: Psychiatry

## 2015-11-05 MED FILL — LEVOTHYROXINE 100 MCG TAB: 100 | 30 days supply | Qty: 30 | Fill #1

## 2015-11-12 ENCOUNTER — Other Ambulatory Visit (HOSPITAL_COMMUNITY): Payer: Self-pay | Admitting: Psychiatry

## 2015-11-12 ENCOUNTER — Other Ambulatory Visit (HOSPITAL_COMMUNITY): Payer: Self-pay

## 2015-11-12 DIAGNOSIS — F3110 Bipolar disorder, current episode manic without psychotic features, unspecified: Secondary | ICD-10-CM

## 2015-11-12 MED FILL — AMITRIPTYLINE HCL 50 MG TAB: 50 | 30 days supply | Qty: 30 | Fill #0

## 2015-11-12 MED FILL — CARBAMAZEPINE ER 200 MG CAP: 200 | 30 days supply | Qty: 60 | Fill #0

## 2015-11-12 MED FILL — risperiDONE 2 MG TABS: 2 | 30 days supply | Qty: 30 | Fill #0

## 2015-11-23 NOTE — Progress Notes (Signed)
[facility] Progress Note  RALYN BODLE [id number] 65 y.o.  11/26/2015 10:51 AM  Chief Complaint: "I'm doing good."  History of Present Illness: Ayani came for her followup appointment. She states that although she initially felt down when the manufacturer of the medication changed, she feels good now. She sleeps 9 hours with night time awakening and feels rested in the morning. She continues to work as [employer] and she enjoys her job. She denies decreased need for sleep or mood swings. She denies SI. She denies AH/VH. Patient denies drinking or using any illegal substances.   Suicidal Ideation: No Plan Formed: No Patient has means to carry out plan: No  Homicidal Ideation: No Plan Formed: No Patient has means to carry out plan: No  Review of Systems  Constitutional: Negative.   Musculoskeletal: Negative for joint pain.  Neurological: Negative for tingling and tremors.  Psychiatric/Behavioral: Negative for depression, hallucinations, substance abuse and suicidal ideas. The patient is not nervous/anxious and does not have insomnia.   All other systems reviewed and are negative.  Psychiatric: Agitation: No Hallucination: No Depressed Mood: No Insomnia: No Hypersomnia: No Altered Concentration: No Feels Worthless: No Grandiose Ideas: No Belief In Special Powers: No New/Increased Substance Abuse: No Compulsions: No  Neurologic: Headache: No Seizure: No Paresthesias: No  Medical History:  Her primary care physician is Penni Homans. Patient has a history of bladder problems, obesity and chronic back pain.  Outpatient Encounter Prescriptions as of 11/26/2015  Medication Sig Dispense Refill  . amitriptyline (ELAVIL) 50 MG tablet Take 1 tablet (50 mg total) by mouth at bedtime. 30 tablet 2  . carbamazepine (CARBATROL) 200 MG 12 hr capsule Take 1 capsule (200 mg total) by mouth 2 (two) times daily. 60 capsule 2  . Clobetasol  Propionate 0.05 % lotion Apply 1 application topically 2 (two) times daily. (Patient not taking: Reported on 02/01/2015) 118 mL 1  . levothyroxine (SYNTHROID, LEVOTHROID) 100 MCG tablet TAKE 1 TABLET BY MOUTH DAILY BEFORE BREAKFAST. 30 tablet 3  . risperiDONE (RISPERDAL) 2 MG tablet Take 1 tablet (2 mg total) by mouth at bedtime. 30 tablet 2  . [DISCONTINUED] amitriptyline (ELAVIL) 50 MG tablet TAKE 1 TABLET (50 MG TOTAL) BY MOUTH AT BEDTIME. 30 tablet 0  . [DISCONTINUED] carbamazepine (CARBATROL) 200 MG 12 hr capsule TAKE 1 CAPSULE (200 MG TOTAL) BY MOUTH 2 (TWO) TIMES DAILY. 60 capsule 0  . [DISCONTINUED] risperiDONE (RISPERDAL) 2 MG tablet TAKE 1 TABLET (2 MG TOTAL) BY MOUTH AT BEDTIME. 30 tablet 0   No facility-administered encounter medications on file as of 11/26/2015.    No results found for this or any previous visit (from the past 2160 hour(s)). Past Psychiatric History/Hospitalization(s): Anxiety: No Bipolar Disorder: Yes Depression: Yes Mania: Yes Psychosis: No Schizophrenia: No Personality Disorder: No Hospitalization for psychiatric illness: Yes History of Electroconvulsive Shock Therapy: No Prior Suicide Attempts: No  Family history: patient reports family history of suicidal ideation but denies any suicide attempt  Physical Exam: Constitutional:  BP 126/72   Pulse 88   Ht 5\' 7"  (1.702 m)   Wt 195 lb 12.8 oz (88.8 kg)   BMI 30.67 kg/m   General Appearance: alert, oriented, no acute distress, well nourished and obese  Musculoskeletal: Strength & Muscle Tone: within normal limits Gait & Station: normal Patient leans:  N/A  Mental status examination Patient is casually dressed and fairly groomed.  Her speech is normal rate, clear and coherent.  She describes her  mood euthymic and her affect is appropriate, smiles at times.  She denies any auditory or visual hallucination.  She denies any active or passive suicidal thoughts or homicidal thoughts.  There was no  paranoia or any delusions.  Her attention and concentration is fair.  She has no tremors or rigidity.  Her thought processes logical and goal-directed.  Her psychomotor activity is normal.  Her fund of knowledge is good.  She is alert and oriented x3. Her insight judgment and impulse control is okay.  Established Problem, Stable/Improving (1), Review of Psycho-Social Stressors (1), Review or order clinical lab tests (1), Review of Last Therapy Session (1) and Review of Medication Regimen & Side Effects (2)  Assessment: CONGETTA REIHL is a 65 year old female with bipolar disorder,  hypothyroidism who presented for follow up appointment. She is patient of Dr. Adele Schilder.   # Bipolar disorder Patient denies significant mood symptoms, and has been tolerating well with current regimen without adverse events. Will continue current regimen.   Plan - Continue Tegretol 200 mg twice a day, amitriptyline 50 mg at bedtime and Risperdal 2 mg at bedtime. - RTC in 3 months  The patient demonstrates the following risk factors for suicide: Chronic risk factors for suicide include: psychiatric disorder of bipolar disorder. Acute risk factors for suicide include: N/A. Protective factors for this patient include: coping skills and hope for the future. Considering these factors, the overall suicide risk at this point appears to be low. Patient is appropriate for outpatient follow up.  Norman Clay, MD 11/26/2015

## 2015-11-26 ENCOUNTER — Encounter (HOSPITAL_COMMUNITY): Payer: Self-pay | Admitting: Psychiatry

## 2015-11-26 ENCOUNTER — Ambulatory Visit (INDEPENDENT_AMBULATORY_CARE_PROVIDER_SITE_OTHER): Payer: 59 | Admitting: Psychiatry

## 2015-11-26 DIAGNOSIS — F3110 Bipolar disorder, current episode manic without psychotic features, unspecified: Secondary | ICD-10-CM | POA: Diagnosis not present

## 2015-11-26 DIAGNOSIS — Z79899 Other long term (current) drug therapy: Secondary | ICD-10-CM

## 2015-11-26 MED ORDER — CARBAMAZEPINE ER 200 MG PO CP12: 200.0000 mg | ORAL_CAPSULE | Freq: Two times a day (BID) | ORAL | 2 refills | Status: DC

## 2015-11-26 MED ORDER — RISPERIDONE 2 MG PO TABS: 2.0000 mg | ORAL_TABLET | Freq: Every day | ORAL | 2 refills | Status: DC

## 2015-11-26 MED ORDER — AMITRIPTYLINE HCL 50 MG PO TABS: 50.0000 mg | ORAL_TABLET | Freq: Every day | ORAL | 2 refills | Status: DC

## 2015-11-26 NOTE — Patient Instructions (Signed)
1. Continue current medication 2. Return to clinic in 3 months 

## 2015-12-05 MED FILL — LEVOTHYROXINE 100 MCG TAB: 100 | 30 days supply | Qty: 30 | Fill #2

## 2015-12-17 MED FILL — CARBAMAZEPINE ER 200 MG CAP: 200 | 30 days supply | Qty: 60 | Fill #0

## 2015-12-17 MED FILL — risperiDONE 2 MG TABS: 2 | 30 days supply | Qty: 30 | Fill #0

## 2015-12-17 MED FILL — AMITRIPTYLINE HCL 50 MG TAB: 50 | 30 days supply | Qty: 30 | Fill #0

## 2015-12-26 ENCOUNTER — Encounter: Payer: Self-pay | Admitting: Family Medicine

## 2015-12-26 NOTE — Telephone Encounter (Signed)
[id number] ° °

## 2015-12-26 NOTE — Telephone Encounter (Addendum)
Relation to pt: self  Call back number: [phone]   Reason for call:  Patient requesting FL[employer] extended to another 6 to 8 months or a year.  Patient states FMLA will expire at the end of the month and scheduled follow up for 01/28/16, please advise

## 2015-12-28 ENCOUNTER — Telehealth: Payer: Self-pay | Admitting: Family Medicine

## 2015-12-28 NOTE — Telephone Encounter (Signed)
Has to be seen please move appt

## 2015-12-28 NOTE — Telephone Encounter (Signed)
Relation to pt: self  Call back number: [phone]   Reason for call:  Patient requesting FMLA extention to another 6 to 8 months or a year.  Patient states FMLA will expire at the end of the month and scheduled follow up for 01/28/16 which was [facility] to 02/18/16, please reference 01/24/2015 initial telephone note, please advise

## 2015-12-31 NOTE — Telephone Encounter (Addendum)
Patient scheduled for 01/25/2016 and wanted to inform PCP FMLA will expire 01/08/16, informed patient last seen PCP 10/2014 and a face to face to encounter is needed, please advise if FMLA can be extended to 01/25/16 appointment?

## 2015-12-31 NOTE — Telephone Encounter (Signed)
No since I have not seen her in so long they will likely not honor it. I am not opposed but we have to document need or they may not honor

## 2015-12-31 NOTE — Telephone Encounter (Signed)
lvm advising patient of message below °

## 2016-01-01 MED FILL — LEVOTHYROXINE 100 MCG TAB: 100 | 30 days supply | Qty: 30 | Fill #3

## 2016-01-01 NOTE — Telephone Encounter (Signed)
lvm advising patient of message below awaiting call back.

## 2016-01-16 MED FILL — risperiDONE 2 MG TABS: 2 | 30 days supply | Qty: 30 | Fill #1

## 2016-01-16 MED FILL — AMITRIPTYLINE HCL 50 MG TAB: 50 | 30 days supply | Qty: 30 | Fill #1

## 2016-01-16 MED FILL — CARBAMAZEPINE ER 200 MG CAP: 200 | 30 days supply | Qty: 60 | Fill #1

## 2016-01-23 ENCOUNTER — Other Ambulatory Visit: Payer: Self-pay | Admitting: Family Medicine

## 2016-01-23 MED FILL — LEVOTHYROXINE 100 MCG TAB: 100 | 30 days supply | Qty: 30 | Fill #0

## 2016-01-23 NOTE — Telephone Encounter (Signed)
I have refilled a 30 d supply of Levothyroxine but she has not had her TSH checked in over a year so needs to come in for a TSH check for more order for hypothyroidism

## 2016-01-25 ENCOUNTER — Ambulatory Visit: Payer: Self-pay | Admitting: Family Medicine

## 2016-01-28 ENCOUNTER — Ambulatory Visit: Payer: Self-pay | Admitting: Family Medicine

## 2016-02-13 ENCOUNTER — Other Ambulatory Visit: Payer: Self-pay | Admitting: Family Medicine

## 2016-02-13 MED FILL — AMITRIPTYLINE HCL 50 MG TAB: 50 | 30 days supply | Qty: 30 | Fill #2

## 2016-02-13 MED FILL — LEVOTHYROXINE 100 MCG TAB: 100 | 30 days supply | Qty: 30 | Fill #0

## 2016-02-13 MED FILL — risperiDONE 2 MG TABS: 2 | 30 days supply | Qty: 30 | Fill #2

## 2016-02-13 MED FILL — CARBAMAZEPINE ER 200 MG CAP: 200 | 30 days supply | Qty: 60 | Fill #2

## 2016-02-15 ENCOUNTER — Ambulatory Visit: Payer: Self-pay | Admitting: Family Medicine

## 2016-02-18 ENCOUNTER — Ambulatory Visit: Payer: Self-pay | Admitting: Family Medicine

## 2016-02-24 NOTE — Progress Notes (Signed)
[facility] [phone] Progress Note  Margaret [name] [id number] 66 y.o.  02/25/2016 11:11 AM  Chief Complaint: "I'm doing good."  History of Present Illness: Margaret [name] came for her follow up appointment. She reports that she has worsening OCD symptoms (hand washing 30 times per day at home) since the manufacturer of carbamazepine changed. She feels depressed about it and is stressed as she has worsening hand rash. She has a tendency to count things, although it does not bother her. She denies mental obsession. She tends to get stressed at work, although she denies feeling depressed. She reports insomnia/herpersomnia, although she denies decreased need for sleep. She denies mood swings. She denies SI. She denies AH/VH. Patient denies drinking or using any illegal substances.   Suicidal Ideation: No Plan Formed: No Patient has means to carry out plan: No  Homicidal Ideation: No Plan Formed: No Patient has means to carry out plan: No  Review of Systems  Constitutional: Negative.   Musculoskeletal: Negative for joint pain.  Skin: Positive for rash.  Neurological: Negative for tingling and tremors.  Psychiatric/Behavioral: Negative for depression, hallucinations, substance abuse and suicidal ideas. The patient is nervous/anxious and has insomnia.   All other systems reviewed and are negative.  Psychiatric: Agitation: No Hallucination: No Depressed Mood: No Insomnia: No Hypersomnia: No Altered Concentration: No Feels Worthless: No Grandiose Ideas: No Belief In Special Powers: No New/Increased Substance Abuse: No Compulsions: No  Neurologic: Headache: No Seizure: No Paresthesias: No  Medical History:  Her primary care physician is Margaret [name]. Patient has a history of bladder problems, obesity and chronic back pain.  Outpatient Encounter Prescriptions as of 02/25/2016  Medication Sig Dispense Refill  . amitriptyline (ELAVIL) 50 MG tablet Take 1 tablet (50 mg total) by mouth  at bedtime. 30 tablet 2  . carbamazepine (CARBATROL) 200 MG 12 hr capsule Take 1 capsule (200 mg total) by mouth 2 (two) times daily. 60 capsule 2  . Clobetasol Propionate 0.05 % lotion Apply 1 application topically 2 (two) times daily. 118 mL 1  . levothyroxine (SYNTHROID, LEVOTHROID) 100 MCG tablet TAKE 1 TABLET BY MOUTH DAILY BEFORE BREAKFAST. 30 tablet 0  . risperiDONE (RISPERDAL) 2 MG tablet Take 1 tablet (2 mg total) by mouth at bedtime. 30 tablet 2  . [DISCONTINUED] amitriptyline (ELAVIL) 50 MG tablet Take 1 tablet (50 mg total) by mouth at bedtime. 30 tablet 2  . [DISCONTINUED] carbamazepine (CARBATROL) 200 MG 12 hr capsule Take 1 capsule (200 mg total) by mouth 2 (two) times daily. 60 capsule 2  . [DISCONTINUED] risperiDONE (RISPERDAL) 2 MG tablet Take 1 tablet (2 mg total) by mouth at bedtime. 30 tablet 2   No facility-administered encounter medications on file as of 02/25/2016.    No results found for this or any previous visit (from the past 2160 hour(s)).  Past Psychiatric History/Hospitalization(s): Anxiety: No Bipolar Disorder: Yes Depression: Yes Mania: Yes Psychosis: No Schizophrenia: No Personality Disorder: No Hospitalization for psychiatric illness: Yes History of Electroconvulsive Shock Therapy: No Prior Suicide Attempts: No Outpatient:  Psychiatry admission: 2004,2005 for "manic state" in the setting of non adherence to medication Previous suicide attempt: denies Past trials of medication: lithium History of violence: denies  Family history: brother, grandparents- OCD, patient reports family history of suicidal ideation but denies any suicide attempt  Physical Exam: Constitutional:  BP 128/76   Pulse (!) 102   Ht 5\' 7"  (1.702 m)   Wt 201 lb 3.2 oz (91.3 kg)   BMI 31.51 kg/m  General Appearance: alert, oriented, no acute distress, well nourished and obese  Musculoskeletal: Strength & Muscle Tone: within normal limits Gait & Station: normal Patient  leans:  N/A  Mental status examination Patient is casually dressed and fairly groomed.  Her speech is normal rate, clear and coherent.  She describes her mood euthymic and her affect is appropriate, smiles at times.  She denies any auditory or visual hallucination.  She denies any active or passive suicidal thoughts or homicidal thoughts.  There was no paranoia or any delusions.  Her attention and concentration is fair.  She has no tremors or rigidity.  Her thought processes logical and goal-directed.  She endorses compulsion of hand washing. Her psychomotor activity is normal.  Her fund of knowledge is good.  She is alert and oriented x3. Her insight judgment and impulse control is good.  Established Problem, Stable/Improving (1), Review of Psycho-Social Stressors (1), Review or order clinical lab tests (1), Review of Last Therapy Session (1) and Review of Medication Regimen & Side Effects (2)  Assessment: Margaret [name] is a 66 year old female with bipolar disorder,  hypothyroidism who presented for follow up appointment.  # Bipolar disorder Patient denies significant mood symptoms, and has been tolerating well with current regimen without adverse events. Will continue current regimen. Will check labs for blood level and any side effects.   # r/o OCD Patient endorses worsening hand washing. Discussed in length regarding options of increasing Risperdal or amitriptyline. She is concerned about potential risks and reports her preference to continue current medication. Although she would benefit from CBT, she would like to hold this option as well. Will continue to monitor. Patient to call the clinic for earlier appointment if there is any worsening in her symptoms.  Plan - Continue Tegretol 200 mg twice a day - Continue amitriptyline 50 mg at bedtime  - Continue Risperdal 2 mg at bedtime. - RTC in 3 months - Check labs (carbamazepine, CBC, CMP) when she sees her PCP  The patient demonstrates the  following risk factors for suicide: Chronic risk factors for suicide include: psychiatric disorder of bipolar disorder. Acute risk factors for suicide include: N/A. Protective factors for this patient include: coping skills and hope for the future. Considering these factors, the overall suicide risk at this point appears to be low. Patient is appropriate for outpatient follow up.  Norman Clay, MD 02/25/2016

## 2016-02-25 ENCOUNTER — Ambulatory Visit (INDEPENDENT_AMBULATORY_CARE_PROVIDER_SITE_OTHER): Payer: 59 | Admitting: Psychiatry

## 2016-02-25 ENCOUNTER — Telehealth: Payer: Self-pay | Admitting: Family Medicine

## 2016-02-25 ENCOUNTER — Encounter (HOSPITAL_COMMUNITY): Payer: Self-pay | Admitting: Psychiatry

## 2016-02-25 DIAGNOSIS — F3110 Bipolar disorder, current episode manic without psychotic features, unspecified: Secondary | ICD-10-CM

## 2016-02-25 DIAGNOSIS — Z79899 Other long term (current) drug therapy: Secondary | ICD-10-CM

## 2016-02-25 MED ORDER — AMITRIPTYLINE HCL 50 MG PO TABS: 50.0000 mg | ORAL_TABLET | Freq: Every day | ORAL | 2 refills | Status: DC

## 2016-02-25 MED ORDER — CARBAMAZEPINE ER 200 MG PO CP12: 200.0000 mg | ORAL_CAPSULE | Freq: Two times a day (BID) | ORAL | 2 refills | Status: DC

## 2016-02-25 MED ORDER — RISPERIDONE 2 MG PO TABS
2.0000 mg | ORAL_TABLET | Freq: Every day | ORAL | 2 refills | Status: DC
Start: 2016-02-25 — End: 2016-05-26

## 2016-02-25 NOTE — Patient Instructions (Addendum)
1. Continue Tegretol 200 mg twice a day, amitriptyline 50 mg at bedtime and Risperdal 2 mg at bedtime. 2. RTC in 3 months 3. Check carbamazepine level, CBC, BMP when you see your primary care provider

## 2016-02-25 NOTE — Telephone Encounter (Signed)
I have not seen her in a year and a 1/2 I need an appt to order labs and evaluate. Would also recommend a lipid panel upon review of her chart.

## 2016-02-25 NOTE — Telephone Encounter (Signed)
Pt says that she would like to have orders placed to have her Thyroid, CBC, BMP and also Cardamazepine levels checked.    CB: [phone]

## 2016-02-26 NOTE — Telephone Encounter (Signed)
lvm for pt to call our office back to schedule an appt with PCP per phone note response.

## 2016-03-13 MED FILL — risperiDONE 2 MG TABS: 2 | 30 days supply | Qty: 30 | Fill #0

## 2016-03-13 MED FILL — CARBAMAZEPINE ER 200 MG CAP: 200 | 30 days supply | Qty: 60 | Fill #0

## 2016-03-13 MED FILL — AMITRIPTYLINE HCL 50 MG TAB: 50 | 30 days supply | Qty: 30 | Fill #0

## 2016-03-14 ENCOUNTER — Ambulatory Visit: Payer: Self-pay | Admitting: Family Medicine

## 2016-03-17 ENCOUNTER — Encounter: Payer: Self-pay | Admitting: Family Medicine

## 2016-03-17 ENCOUNTER — Other Ambulatory Visit: Payer: Self-pay | Admitting: Family Medicine

## 2016-03-17 ENCOUNTER — Ambulatory Visit (INDEPENDENT_AMBULATORY_CARE_PROVIDER_SITE_OTHER): Payer: 59 | Admitting: Family Medicine

## 2016-03-17 DIAGNOSIS — K219 Gastro-esophageal reflux disease without esophagitis: Secondary | ICD-10-CM

## 2016-03-17 DIAGNOSIS — R739 Hyperglycemia, unspecified: Secondary | ICD-10-CM | POA: Diagnosis not present

## 2016-03-17 DIAGNOSIS — E039 Hypothyroidism, unspecified: Secondary | ICD-10-CM | POA: Diagnosis not present

## 2016-03-17 DIAGNOSIS — E785 Hyperlipidemia, unspecified: Secondary | ICD-10-CM | POA: Diagnosis not present

## 2016-03-17 DIAGNOSIS — M940 Chondrocostal junction syndrome [Tietze]: Secondary | ICD-10-CM

## 2016-03-17 LAB — COMPREHENSIVE METABOLIC PANEL
ALBUMIN: 4.5 g/dL (ref 3.5–5.2)
ALK PHOS: 115 U/L (ref 39–117)
ALT: 25 U/L (ref 0–35)
AST: 17 U/L (ref 0–37)
BILIRUBIN TOTAL: 0.4 mg/dL (ref 0.2–1.2)
BUN: 27 mg/dL — ABNORMAL HIGH (ref 6–23)
CO2: 24 mEq/L (ref 19–32)
Calcium: 9.7 mg/dL (ref 8.4–10.5)
Chloride: 110 mEq/L (ref 96–112)
Creatinine, Ser: 0.96 mg/dL (ref 0.40–1.20)
GFR: 61.9 mL/min (ref >60.00)
GLUCOSE: 91 mg/dL (ref 70–99)
POTASSIUM: 4.3 meq/L (ref 3.5–5.1)
SODIUM: 140 meq/L (ref 135–145)
TOTAL PROTEIN: 7 g/dL (ref 6.0–8.3)

## 2016-03-17 LAB — CBC
HEMATOCRIT: 42.6 % (ref 36.0–46.0)
Hemoglobin: 14.6 g/dL (ref 12.0–15.0)
MCHC: 34.3 g/dL (ref 30.0–36.0)
MCV: 95.7 fl (ref 78.0–100.0)
Platelets: 235 10*3/uL (ref 150.0–400.0)
RBC: 4.46 Mil/uL (ref 3.87–5.11)
RDW: 12.6 % (ref 11.5–15.5)
WBC: 6.1 10*3/uL (ref 4.0–10.5)

## 2016-03-17 LAB — LIPID PANEL
CHOLESTEROL: 221 mg/dL — AB (ref 0–200)
HDL: 45 mg/dL (ref >39.00)
LDL Cholesterol: 140 mg/dL — ABNORMAL HIGH (ref 0–99)
NONHDL: 175.82
Total CHOL/HDL Ratio: 5
Triglycerides: 180 mg/dL — ABNORMAL HIGH (ref 0.0–149.0)
VLDL: 36 mg/dL (ref 0.0–40.0)

## 2016-03-17 LAB — TSH: TSH: 5.18 u[IU]/mL — AB (ref 0.35–4.50)

## 2016-03-17 LAB — HEMOGLOBIN A1C: HEMOGLOBIN A1C: 5.5 % (ref 4.6–6.5)

## 2016-03-17 LAB — CARBAMAZEPINE LEVEL, TOTAL: Carbamazepine Lvl: 7 mg/L (ref 4.0–12.0)

## 2016-03-17 NOTE — Assessment & Plan Note (Signed)
Fell on left side when her heal caught in the grass. Is getting good relief with 2 Ibuprofen tid and is improving over time. She will report increase in pain

## 2016-03-17 NOTE — Assessment & Plan Note (Signed)
Following closely with behavioral health and stable meds, no recent changes

## 2016-03-17 NOTE — Progress Notes (Signed)
Subjective:    Patient ID: Margaret [name], female    DOB: [birth year], 66 y.o.   MRN: [id number]  Chief Complaint  Patient presents with  . Follow-up  . Hypothyroidism  I acted as a Education administrator for Dr. Charlett Blake. Princess, RMA   HPI Patient is in today for follow up for hypothyroidism, hyperglycemia, hyperlipidemia, and bipolar disorder. Is following with behavioral medicine nad is stable with meds. Denies CP/palp/SOB/HA/congestion/fevers/GI or GU c/o. Taking meds as prescribed. She fell 2 weeks ago when she tripped. Fell onto left side. Injured chest wall and arm with significant bruising but is largely better, only some mild chest wall pain persists, responds to Ibuprofen and does not limit movement. No polyuria or polydipsia.  Past Medical History:  Diagnosis Date  . Arthritis 02/14/2013  . Bipolar disorder ([place])   . Cerumen impaction 12/26/2012  . Costochondritis 03/17/2016  . Diarrhea 12/26/2012  . Esophageal reflux 09/19/2012  . Hyperglycemia 09/19/2012  . Hypoglycemia   . Kidney stone 11/05/2014  . Other malaise and fatigue 09/19/2012  . PTSD (post-traumatic stress disorder)   . Renal insufficiency 12/26/2012  . Thyroid disease     Past Surgical History:  Procedure Laterality Date  . TUBAL LIGATION     reversal    Family History  Problem Relation Age of Onset  . Diabetes Mother     Brother 1 of 2  . Heart failure Mother   . Prostate cancer Father   . Parkinsonism Father     deceased  . Hypertension Brother   . Esophageal cancer Maternal Grandmother   . Cancer Paternal Grandmother     cancer of jawbone 1 of 2  . Melanoma Brother   . Breast cancer Neg Hx   . Colon cancer Neg Hx     Social History   Social History  . Marital status: Divorced    Spouse name: N/A  . Number of children: N/A  . Years of education: N/A   Occupational History  . Not on file.   Social History Main Topics  . Smoking status: Former Research scientist (life sciences)  . Smokeless tobacco: Never Used  . Alcohol use No    . Drug use: No  . Sexual activity: Not on file   Other Topics Concern  . Not on file   Social History Narrative  . No narrative on file    Outpatient Medications Prior to Visit  Medication Sig Dispense Refill  . amitriptyline (ELAVIL) 50 MG tablet Take 1 tablet (50 mg total) by mouth at bedtime. 30 tablet 2  . carbamazepine (CARBATROL) 200 MG 12 hr capsule Take 1 capsule (200 mg total) by mouth 2 (two) times daily. 60 capsule 2  . Clobetasol Propionate 0.05 % lotion Apply 1 application topically 2 (two) times daily. 118 mL 1  . levothyroxine (SYNTHROID, LEVOTHROID) 100 MCG tablet TAKE 1 TABLET BY MOUTH DAILY BEFORE BREAKFAST. 30 tablet 0  . risperiDONE (RISPERDAL) 2 MG tablet Take 1 tablet (2 mg total) by mouth at bedtime. 30 tablet 2   No facility-administered medications prior to visit.     Allergies  Allergen Reactions  . Codeine Nausea Only  . Darvon     Hallucinations   . Sulfa Drugs Cross Reactors Nausea And Vomiting    Review of Systems  Constitutional: Negative for fever and malaise/fatigue.  HENT: Negative for congestion.   Eyes: Negative for blurred vision.  Respiratory: Negative for cough and shortness of breath.   Cardiovascular: Positive for chest pain. Negative  for palpitations and leg swelling.  Gastrointestinal: Negative for vomiting.  Musculoskeletal: Positive for joint pain. Negative for back pain.  Skin: Negative for rash.  Neurological: Negative for loss of consciousness and headaches.       Objective:    Physical Exam  Constitutional: She is oriented to person, place, and time. She appears well-developed and well-nourished. No distress.  HENT:  Head: Normocephalic and atraumatic.  Eyes: Conjunctivae are normal.  Neck: Normal range of motion. No thyromegaly present.  Cardiovascular: Normal rate and regular rhythm.   Pulmonary/Chest: Effort normal and breath sounds normal. She has no wheezes.  Abdominal: Soft. Bowel sounds are normal. There is  no tenderness.  Musculoskeletal: Normal range of motion. She exhibits no edema or deformity.  Neurological: She is alert and oriented to person, place, and time.  Skin: Skin is warm and dry. She is not diaphoretic.  Psychiatric: She has a normal mood and affect.    BP 126/86 (BP Location: Left Arm, Patient Position: Sitting, Cuff Size: Normal)   Pulse 86   Temp 97.8 F (36.6 C) (Oral)   Wt 197 lb 9.6 oz (89.6 kg)   SpO2 99%   BMI 30.95 kg/m  Wt Readings from Last 3 Encounters:  03/17/16 197 lb 9.6 oz (89.6 kg)  01/22/15 196 lb 12.8 oz (89.3 kg)  01/18/15 197 lb (89.4 kg)     Lab Results  Component Value Date   WBC 5.7 02/01/2015   HGB 14.3 02/01/2015   HCT 39.9 02/01/2015   PLT 269.0 01/18/2015   GLUCOSE 107 (H) 02/01/2015   CHOL 227 (H) 10/30/2014   TRIG 312.0 (H) 10/30/2014   HDL 43.50 10/30/2014   LDLDIRECT 162.0 10/30/2014   LDLCALC 83 07/12/2012   ALT 11 02/01/2015   AST 11 02/01/2015   NA 140 02/01/2015   K 4.4 02/01/2015   CL 105 02/01/2015   CREATININE 1.04 (H) 02/01/2015   BUN 21 02/01/2015   CO2 25 02/01/2015   TSH 2.09 10/30/2014   HGBA1C 5.6 08/28/2014    Lab Results  Component Value Date   TSH 2.09 10/30/2014   Lab Results  Component Value Date   WBC 5.7 02/01/2015   HGB 14.3 02/01/2015   HCT 39.9 02/01/2015   MCV 92.4 02/01/2015   PLT 269.0 01/18/2015   Lab Results  Component Value Date   NA 140 02/01/2015   K 4.4 02/01/2015   CO2 25 02/01/2015   GLUCOSE 107 (H) 02/01/2015   BUN 21 02/01/2015   CREATININE 1.04 (H) 02/01/2015   BILITOT 0.2 02/01/2015   ALKPHOS 114 02/01/2015   AST 11 02/01/2015   ALT 11 02/01/2015   PROT 6.6 02/01/2015   ALBUMIN 4.3 02/01/2015   CALCIUM 9.6 02/01/2015   ANIONGAP 9 02/15/2014   GFR 74.52 01/18/2015   Lab Results  Component Value Date   CHOL 227 (H) 10/30/2014   Lab Results  Component Value Date   HDL 43.50 10/30/2014   Lab Results  Component Value Date   LDLCALC 83 07/12/2012   Lab  Results  Component Value Date   TRIG 312.0 (H) 10/30/2014   Lab Results  Component Value Date   CHOLHDL 5 10/30/2014   Lab Results  Component Value Date   HGBA1C 5.6 08/28/2014       Assessment & Plan:   Problem List Items Addressed This Visit    Hypothyroidism    On Levothyroxine, continue to monitor      Relevant Orders   TSH  Carbamazepine Level (Tegretol), total   Borderline hyperlipidemia    Encouraged heart healthy diet, increase exercise, avoid trans fats, consider a krill oil cap daily      Relevant Orders   Lipid panel   Hyperglycemia    hgba1c acceptable, minimize simple carbs. Increase exercise as tolerated.       Relevant Orders   Hemoglobin A1c   CBC   Comprehensive metabolic panel   Esophageal reflux    No flare with recent increase in Ibuprofen use.       Relevant Orders   CBC   Comprehensive metabolic panel   Costochondritis    Fell on left side when her heal caught in the grass. Is getting good relief with 2 Ibuprofen tid and is improving over time. She will report increase in pain         I am having Ms. Monical maintain her Clobetasol Propionate, levothyroxine, amitriptyline, carbamazepine, and risperiDONE.  No orders of the defined types were placed in this encounter.   CMA served as Education administrator during this visit. History, Physical and Plan performed by medical provider. Documentation and orders reviewed and attested to.  Penni Homans, MD

## 2016-03-17 NOTE — Assessment & Plan Note (Signed)
No flare with recent increase in Ibuprofen use.

## 2016-03-17 NOTE — Patient Instructions (Signed)
Would like to discontinue my chart account   Carbohydrate Counting for Diabetes Mellitus, Adult Carbohydrate counting is a method for keeping track of how many carbohydrates you eat. Eating carbohydrates naturally increases the amount of sugar (glucose) in the blood. Counting how many carbohydrates you eat helps keep your blood glucose within normal limits, which helps you manage your diabetes (diabetes mellitus). It is important to know how many carbohydrates you can safely have in each meal. This is different for every person. A diet and nutrition specialist (registered dietitian) can help you make a meal plan and calculate how many carbohydrates you should have at each meal and snack. Carbohydrates are found in the following foods:  Grains, such as breads and cereals.  Dried beans and soy products.  Starchy vegetables, such as potatoes, peas, and corn.  Fruit and fruit juices.  Milk and yogurt.  Sweets and snack foods, such as cake, cookies, candy, chips, and soft drinks. How do I count carbohydrates? There are two ways to count carbohydrates in food. You can use either of the methods or a combination of both. Reading "Nutrition Facts" on packaged food  The "Nutrition Facts" list is included on the labels of almost all packaged foods and beverages in the [place] It includes:  The serving size.  Information about nutrients in each serving, including the grams (g) of carbohydrate per serving. To use the "Nutrition Facts":  Decide how many servings you will have.  Multiply the number of servings by the number of carbohydrates per serving.  The resulting number is the total amount of carbohydrates that you will be having. Learning standard serving sizes of other foods  When you eat foods containing carbohydrates that are not packaged or do not include "Nutrition Facts" on the label, you need to measure the servings in order to count the amount of carbohydrates:  Measure the foods  that you will eat with a food scale or measuring cup, if needed.  Decide how many standard-size servings you will eat.  Multiply the number of servings by 15. Most carbohydrate-rich foods have about 15 g of carbohydrates per serving.  For example, if you eat 8 oz (170 g) of strawberries, you will have eaten 2 servings and 30 g of carbohydrates (2 servings x 15 g = 30 g).  For foods that have more than one food mixed, such as soups and casseroles, you must count the carbohydrates in each food that is included. The following list contains standard serving sizes of common carbohydrate-rich foods. Each of these servings has about 15 g of carbohydrates:   hamburger bun or  English muffin.   oz (15 mL) syrup.   oz (14 g) jelly.  1 slice of bread.  1 six-inch tortilla.  3 oz (85 g) cooked rice or pasta.  4 oz (113 g) cooked dried beans.  4 oz (113 g) starchy vegetable, such as peas, corn, or potatoes.  4 oz (113 g) hot cereal.  4 oz (113 g) mashed potatoes or  of a large baked potato.  4 oz (113 g) canned or frozen fruit.  4 oz (120 mL) fruit juice.  4-6 crackers.  6 chicken nuggets.  6 oz (170 g) unsweetened dry cereal.  6 oz (170 g) plain fat-free yogurt or yogurt sweetened with artificial sweeteners.  8 oz (240 mL) milk.  8 oz (170 g) fresh fruit or one small piece of fruit.  24 oz (680 g) popped popcorn. Example of carbohydrate counting Sample meal  3 oz (85 g) chicken breast.  6 oz (170 g) brown rice.  4 oz (113 g) corn.  8 oz (240 mL) milk.  8 oz (170 g) strawberries with sugar-free whipped topping. Carbohydrate calculation 1. Identify the foods that contain carbohydrates:  Rice.  Corn.  Milk.  Strawberries. 2. Calculate how many servings you have of each food:  2 servings rice.  1 serving corn.  1 serving milk.  1 serving strawberries. 3. Multiply each number of servings by 15 g:  2 servings rice x 15 g = 30 g.  1 serving corn x  15 g = 15 g.  1 serving milk x 15 g = 15 g.  1 serving strawberries x 15 g = 15 g. 4. Add together all of the amounts to find the total grams of carbohydrates eaten:  30 g + 15 g + 15 g + 15 g = 75 g of carbohydrates total. This information is not intended to replace advice given to you by your health care provider. Make sure you discuss any questions you have with your health care provider. Document Released: 01/27/2005 Document Revised: 08/17/2015 Document Reviewed: 07/11/2015 Elsevier Interactive Patient Education  2017 [employer].

## 2016-03-17 NOTE — Telephone Encounter (Signed)
Updated medication list

## 2016-03-17 NOTE — Assessment & Plan Note (Signed)
hgba1c acceptable, minimize simple carbs. Increase exercise as tolerated.  

## 2016-03-17 NOTE — Assessment & Plan Note (Signed)
Encouraged heart healthy diet, increase exercise, avoid trans fats, consider a krill oil cap daily 

## 2016-03-17 NOTE — Progress Notes (Signed)
Pre visit review using our clinic review tool, if applicable. No additional management support is needed unless otherwise documented below in the visit note. 

## 2016-03-17 NOTE — Assessment & Plan Note (Signed)
On Levothyroxine, continue to monitor 

## 2016-03-25 ENCOUNTER — Telehealth: Payer: Self-pay | Admitting: Family Medicine

## 2016-03-25 NOTE — Telephone Encounter (Signed)
See if she can split the tab and take an extra 1/2 on 2 days a week instead of 1 extra daily and see if she tolerates that better. We will recheck labs in 3 months no matter what. If she does not tolerate new dose then just take one a day as before til next blood drawr

## 2016-03-25 NOTE — Telephone Encounter (Signed)
Caller name: Relationship to patient: Self Can be reached: [phone] Pharmacy:  Reason for call: Patient states that taking a double dose of her Thyroid medication is causing her confusion and anxiety. Plse adv

## 2016-03-26 NOTE — Telephone Encounter (Signed)
Patient agreed to Dr Rhae Lerner' suggestions to split tablet and  take an extra 1/2 on 2 days a week.States she will let us know if this helps.

## 2016-03-28 ENCOUNTER — Other Ambulatory Visit: Payer: Self-pay | Admitting: Family Medicine

## 2016-03-28 MED FILL — LEVOTHYROXINE 100 MCG TAB: 100 | 30 days supply | Qty: 30 | Fill #0

## 2016-04-09 MED FILL — risperiDONE 2 MG TABS: 2 | 30 days supply | Qty: 30 | Fill #1

## 2016-04-09 MED FILL — CARBAMAZEPINE ER 200 MG CAP: 200 | 30 days supply | Qty: 60 | Fill #1

## 2016-04-09 MED FILL — AMITRIPTYLINE HCL 50 MG TAB: 50 | 30 days supply | Qty: 30 | Fill #1

## 2016-05-01 ENCOUNTER — Other Ambulatory Visit: Payer: Self-pay | Admitting: Family Medicine

## 2016-05-01 MED FILL — LEVOTHYROXINE 100 MCG TAB: 100 | 30 days supply | Qty: 30 | Fill #0

## 2016-05-08 MED FILL — AMITRIPTYLINE HCL 50 MG TAB: 50 | 30 days supply | Qty: 30 | Fill #2

## 2016-05-08 MED FILL — risperiDONE 2 MG TABS: 2 | 30 days supply | Qty: 30 | Fill #2

## 2016-05-08 MED FILL — CARBAMAZEPINE ER 200 MG CAP: 200 | 30 days supply | Qty: 60 | Fill #2

## 2016-05-26 ENCOUNTER — Ambulatory Visit (INDEPENDENT_AMBULATORY_CARE_PROVIDER_SITE_OTHER): Payer: 59 | Admitting: Psychiatry

## 2016-05-26 ENCOUNTER — Encounter (HOSPITAL_COMMUNITY): Payer: Self-pay | Admitting: Psychiatry

## 2016-05-26 ENCOUNTER — Ambulatory Visit (HOSPITAL_COMMUNITY): Payer: Self-pay | Admitting: Psychiatry

## 2016-05-26 DIAGNOSIS — Z87891 Personal history of nicotine dependence: Secondary | ICD-10-CM | POA: Diagnosis not present

## 2016-05-26 DIAGNOSIS — Z79899 Other long term (current) drug therapy: Secondary | ICD-10-CM

## 2016-05-26 DIAGNOSIS — F319 Bipolar disorder, unspecified: Secondary | ICD-10-CM | POA: Diagnosis not present

## 2016-05-26 MED ORDER — CARBAMAZEPINE ER 200 MG PO CP12: 200.0000 mg | ORAL_CAPSULE | Freq: Two times a day (BID) | ORAL | 3 refills | Status: DC

## 2016-05-26 MED ORDER — AMITRIPTYLINE HCL 50 MG PO TABS: 50.0000 mg | ORAL_TABLET | Freq: Every day | ORAL | 3 refills | Status: DC

## 2016-05-26 MED ORDER — RISPERIDONE 2 MG PO TABS: 2.0000 mg | ORAL_TABLET | Freq: Every day | ORAL | 3 refills | Status: DC

## 2016-05-26 NOTE — Progress Notes (Signed)
BH MD/PA/NP OP Progress Note  05/26/2016 8:21 AM Margaret [name]  MRN:  [id number]  Chief Complaint:  med follow-up, transfer care  Subjective: Margaret [name] tense today for medication management follow-up. I spent time learning about the patient's psychiatric history, and her medication management history. Learned about her social history, and what she tends to do for leisure activities. She reports that her mood and sleep have been stable on the current medication regimen. She had laboratory studies checked recently and February and these were reviewed by this Probation officer. She is having a repeat thyroid study at the end of this month. She denies any suicidality or down or depressed mood. Her OCD symptoms have remained calm. No significant symptoms of mania or psychosis. She feels comfortable with the regimen, and denies any side effects at this time. Agrees to follow-up in 3 months or sooner if needed.  Visit Diagnosis:    ICD-9-CM ICD-10-CM   1. Bipolar 1 disorder (HCC) 296.7 F31.9 risperiDONE (RISPERDAL) 2 MG tablet     carbamazepine (CARBATROL) 200 MG 12 hr capsule     amitriptyline (ELAVIL) 50 MG tablet    Past Psychiatric History: See intake H&P for full details. Reviewed, with no updates at this time.  Past Medical History:  Past Medical History:  Diagnosis Date  . Arthritis 02/14/2013  . Bipolar disorder ([place])   . Cerumen impaction 12/26/2012  . Costochondritis 03/17/2016  . Diarrhea 12/26/2012  . Esophageal reflux 09/19/2012  . Hyperglycemia 09/19/2012  . Hypoglycemia   . Kidney stone 11/05/2014  . Other malaise and fatigue 09/19/2012  . PTSD (post-traumatic stress disorder)   . Renal insufficiency 12/26/2012  . Thyroid disease     Past Surgical History:  Procedure Laterality Date  . TUBAL LIGATION     reversal    Family Psychiatric History: See intake H&P for full details. Reviewed, with no updates at this time.   Family History:  Family History  Problem Relation Age of Onset  .  Diabetes Mother     Brother 1 of 2  . Heart failure Mother   . Prostate cancer Father   . Parkinsonism Father     deceased  . Hypertension Brother   . Esophageal cancer Maternal Grandmother   . Cancer Paternal Grandmother     cancer of jawbone 1 of 2  . Melanoma Brother   . Breast cancer Neg Hx   . Colon cancer Neg Hx     Social History:  Social History   Social History  . Marital status: Divorced    Spouse name: N/A  . Number of children: N/A  . Years of education: N/A   Social History Main Topics  . Smoking status: Former Research scientist (life sciences)  . Smokeless tobacco: Never Used  . Alcohol use No  . Drug use: No  . Sexual activity: Not Asked   Other Topics Concern  . None   Social History Narrative  . None    Allergies:  Allergies  Allergen Reactions  . Codeine Nausea Only  . Darvon     Hallucinations   . Sulfa Drugs Cross Reactors Nausea And Vomiting    Metabolic Disorder Labs: Lab Results  Component Value Date   HGBA1C 5.5 03/17/2016   MPG 114 08/28/2014   MPG 131 (H) 06/20/2013   No results found for: PROLACTIN Lab Results  Component Value Date   CHOL 221 (H) 03/17/2016   TRIG 180.0 (H) 03/17/2016   HDL 45.00 03/17/2016   CHOLHDL 5 03/17/2016  VLDL 36.0 03/17/2016   LDLCALC 140 (H) 03/17/2016   LDLCALC 83 07/12/2012     Current Medications: Current Outpatient Prescriptions  Medication Sig Dispense Refill  . amitriptyline (ELAVIL) 50 MG tablet Take 1 tablet (50 mg total) by mouth at bedtime. 90 tablet 3  . carbamazepine (CARBATROL) 200 MG 12 hr capsule Take 1 capsule (200 mg total) by mouth 2 (two) times daily. 180 capsule 3  . Clobetasol Propionate 0.05 % lotion Apply 1 application topically 2 (two) times daily. 118 mL 1  . levothyroxine (SYNTHROID, LEVOTHROID) 100 MCG tablet TAKE 1 TABLET BY MOUTH DAILY BEFORE BREAKFAST. 30 tablet 1  . risperiDONE (RISPERDAL) 2 MG tablet Take 1 tablet (2 mg total) by mouth at bedtime. 90 tablet 3   No current  facility-administered medications for this visit.     Neurologic: Headache: Negative Seizure: Negative Paresthesias: Negative  Musculoskeletal: Strength & Muscle Tone: within normal limits Gait & Station: normal Patient leans: N/A  Psychiatric Specialty Exam: Review of Systems  Constitutional: Negative.   HENT: Negative.   Respiratory: Negative.   Cardiovascular: Negative.   Gastrointestinal: Negative.   Skin: Negative.   Psychiatric/Behavioral: Negative.   All other systems reviewed and are negative.   Blood pressure 126/72, pulse 94, height 5\' 7"  (1.702 m), weight 186 lb 12.8 oz (84.7 kg).Body mass index is 29.26 kg/m.  General Appearance: Casual and Well Groomed  Eye Contact:  Good  Speech:  Clear and Coherent  Volume:  Patient is hard of hearing at baseline  Mood:  Euthymic  Affect:  Congruent  Thought Process:  Goal Directed  Orientation:  Full (Time, Place, and Person)  Thought Content: Logical   Suicidal Thoughts:  No  Homicidal Thoughts:  No  Memory:  Immediate;   Good  Judgement:  Good  Insight:  Good  Psychomotor Activity:  Normal  Concentration:  Concentration: Good  Recall:  NA  Fund of Knowledge: Good  Language: Good  Akathisia:  Negative  Handed:  Right  AIMS (if indicated):  0  Assets:  Communication Skills Desire for Improvement Financial Resources/Insurance Housing Leisure Time Physical Health Resilience Social Support Transportation Vocational/Educational  ADL's:  Intact  Cognition: WNL  Sleep:  7-9 hours nightly, snoring, no coughing or waking   Treatment Plan Summary: Margaret [name] is a 66 year old female with bipolar disorder who presents today for medication management follow-up and transfer of care. She is currently stable on her medication regimen with carbamazepine, risperidone, Elavil. She has no side effects or intolerance.  Recent chemistry profile, lipid profile, CBC, and carbamazepine level from February 2018 was reviewed by  this Probation officer.  No acute safety issues, she will follow up in 3 months or sooner if needed.  1. Bipolar 1 disorder (HCC)    Continue carbamazepine 200 mg twice daily Continue risperidone 2 mg nightly Continue Elavil 50 mg nightly Follow-up in 3 months  Aundra Dubin, MD 05/26/2016, 8:21 AM

## 2016-05-27 MED FILL — LEVOTHYROXINE 100 MCG TAB: 100 | 30 days supply | Qty: 30 | Fill #1

## 2016-06-09 ENCOUNTER — Ambulatory Visit (INDEPENDENT_AMBULATORY_CARE_PROVIDER_SITE_OTHER): Payer: 59 | Admitting: Family Medicine

## 2016-06-09 ENCOUNTER — Other Ambulatory Visit (HOSPITAL_COMMUNITY)
Admission: RE | Admit: 2016-06-09 | Discharge: 2016-06-09 | Disposition: A | Discharge status: Home / Self Care | Payer: 59 | Source: Ambulatory Visit | Attending: Family Medicine | Admitting: Family Medicine

## 2016-06-09 ENCOUNTER — Encounter: Payer: Self-pay | Admitting: Family Medicine

## 2016-06-09 VITALS — BP 121/70 | HR 80 | Temp 98.5°F | Ht 67.0 in | Wt 185.4 lb

## 2016-06-09 DIAGNOSIS — F319 Bipolar disorder, unspecified: Secondary | ICD-10-CM | POA: Diagnosis not present

## 2016-06-09 DIAGNOSIS — Z0000 Encounter for general adult medical examination without abnormal findings: Secondary | ICD-10-CM | POA: Insufficient documentation

## 2016-06-09 DIAGNOSIS — R739 Hyperglycemia, unspecified: Secondary | ICD-10-CM

## 2016-06-09 DIAGNOSIS — E039 Hypothyroidism, unspecified: Secondary | ICD-10-CM | POA: Diagnosis not present

## 2016-06-09 DIAGNOSIS — Z124 Encounter for screening for malignant neoplasm of cervix: Secondary | ICD-10-CM | POA: Diagnosis not present

## 2016-06-09 DIAGNOSIS — Z1231 Encounter for screening mammogram for malignant neoplasm of breast: Secondary | ICD-10-CM

## 2016-06-09 DIAGNOSIS — E2839 Other primary ovarian failure: Secondary | ICD-10-CM

## 2016-06-09 DIAGNOSIS — Z1159 Encounter for screening for other viral diseases: Secondary | ICD-10-CM

## 2016-06-09 DIAGNOSIS — Z1239 Encounter for other screening for malignant neoplasm of breast: Secondary | ICD-10-CM

## 2016-06-09 DIAGNOSIS — E785 Hyperlipidemia, unspecified: Secondary | ICD-10-CM

## 2016-06-09 HISTORY — DX: Encounter for general adult medical examination without abnormal findings: Z00.00

## 2016-06-09 LAB — TSH: TSH: 8.83 u[IU]/mL — AB (ref 0.35–4.50)

## 2016-06-09 NOTE — Assessment & Plan Note (Signed)
Patient encouraged to maintain heart healthy diet, regular exercise, adequate sleep. Consider daily probiotics. Take medications as prescribed 

## 2016-06-09 NOTE — Progress Notes (Signed)
Pre visit review using our clinic review tool, if applicable. No additional management support is needed unless otherwise documented below in the visit note. 

## 2016-06-09 NOTE — Assessment & Plan Note (Signed)
Dose adjusted will check labs today

## 2016-06-09 NOTE — Patient Instructions (Signed)

## 2016-06-09 NOTE — Assessment & Plan Note (Signed)
Doing well, stable on current meds, no changes

## 2016-06-09 NOTE — Assessment & Plan Note (Signed)
hgba1c acceptable, minimize simple carbs. Increase exercise as tolerated.  

## 2016-06-09 NOTE — Assessment & Plan Note (Signed)
Encouraged heart healthy diet, increase exercise, avoid trans fats, consider a krill oil cap daily 

## 2016-06-09 NOTE — Assessment & Plan Note (Signed)
Pap today, no concerns on exam.  

## 2016-06-09 NOTE — Progress Notes (Signed)
Patient ID: Margaret [name], female   DOB: 30-Aug-1950, 66 y.o.   MRN: [id number]   Subjective:  I acted as a Education administrator for Penni Homans, [place], [place]   Patient ID: Margaret [name], female    DOB: 09/28/1950, 66 y.o.   MRN: [id number]  Chief Complaint  Patient presents with  . Annual Exam  . Hyperlipidemia  . Hyperglycemia    Hyperlipidemia  This is a chronic problem. The problem is controlled. Pertinent negatives include no chest pain or shortness of breath.  Hyperglycemia  This is a chronic problem. Pertinent negatives include no chest pain, congestion, coughing, fever, headaches, rash or vomiting.    Patient is in today for an annual examination. Patient has a Hx of hyperglycemia, hyperlipidemia, hypothyroidism, arthritis. Patient would like to have her thyroid levels rechecked today. Patient has no acute concerns noted at this time. She feels well. She denies any recent febrile illness or hospitalization. She has no GYN complaints. No breast concerns. She is sleeping well. She has changed her diet and lost significant weight. As a result she notes she has less daily pain. She does complain of some mild hair loss but this is ongoing. Is doing well with ADLs at home. Denies CP/palp/SOB/HA/congestion/fevers/GI or GU c/o. Taking meds as prescribed  Patient Care Team: Mosie Lukes, MD as PCP - General (Family Medicine)   Past Medical History:  Diagnosis Date  . Arthritis 02/14/2013  . Bipolar disorder ([place])   . Cerumen impaction 12/26/2012  . Costochondritis 03/17/2016  . Diarrhea 12/26/2012  . Esophageal reflux 09/19/2012  . Hyperglycemia 09/19/2012  . Hypoglycemia   . Kidney stone 11/05/2014  . Other malaise and fatigue 09/19/2012  . Preventative health care 06/09/2016  . PTSD (post-traumatic stress disorder)   . Renal insufficiency 12/26/2012  . Thyroid disease     Past Surgical History:  Procedure Laterality Date  . TUBAL LIGATION     reversal    Family History  Problem Relation Age  of Onset  . Diabetes Mother     Brother 1 of 2  . Heart failure Mother   . Prostate cancer Father   . Parkinsonism Father     deceased  . Hypertension Brother   . Esophageal cancer Maternal Grandmother   . Cancer Paternal Grandmother     cancer of jawbone 1 of 2  . Melanoma Brother   . Breast cancer Neg Hx   . Colon cancer Neg Hx     Social History   Social History  . Marital status: Divorced    Spouse name: N/A  . Number of children: N/A  . Years of education: N/A   Occupational History  . Not on file.   Social History Main Topics  . Smoking status: Former Research scientist (life sciences)  . Smokeless tobacco: Never Used  . Alcohol use No  . Drug use: No  . Sexual activity: Not on file   Other Topics Concern  . Not on file   Social History Narrative  . No narrative on file    Outpatient Medications Prior to Visit  Medication Sig Dispense Refill  . amitriptyline (ELAVIL) 50 MG tablet Take 1 tablet (50 mg total) by mouth at bedtime. 90 tablet 3  . carbamazepine (CARBATROL) 200 MG 12 hr capsule Take 1 capsule (200 mg total) by mouth 2 (two) times daily. 180 capsule 3  . Clobetasol Propionate 0.05 % lotion Apply 1 application topically 2 (two) times daily. 118 mL 1  . risperiDONE (RISPERDAL)  2 MG tablet Take 1 tablet (2 mg total) by mouth at bedtime. 90 tablet 3  . levothyroxine (SYNTHROID, LEVOTHROID) 100 MCG tablet TAKE 1 TABLET BY MOUTH DAILY BEFORE BREAKFAST. 30 tablet 1   No facility-administered medications prior to visit.     Allergies  Allergen Reactions  . Codeine Nausea Only  . Darvon     Hallucinations   . Sulfa Drugs Cross Reactors Nausea And Vomiting    Review of Systems  Constitutional: Negative for fever and malaise/fatigue.  HENT: Negative for congestion.   Eyes: Negative for blurred vision.  Respiratory: Negative for cough and shortness of breath.   Cardiovascular: Negative for chest pain, palpitations and leg swelling.  Gastrointestinal: Negative for heartburn  and vomiting.  Musculoskeletal: Negative for back pain.  Skin: Negative for rash.  Neurological: Negative for loss of consciousness and headaches.  Psychiatric/Behavioral: Negative for depression, substance abuse and suicidal ideas. The patient is not nervous/anxious.        Objective:    Physical Exam  Constitutional: She is oriented to person, place, and time. She appears well-developed and well-nourished. No distress.  HENT:  Head: Normocephalic and atraumatic.  Eyes: Conjunctivae are normal.  Neck: Normal range of motion. No thyromegaly present.  Cardiovascular: Normal rate and regular rhythm.   Pulmonary/Chest: Effort normal and breath sounds normal. She has no wheezes.  Abdominal: Soft. Bowel sounds are normal. There is no tenderness.  Genitourinary: Vagina normal and uterus normal. No vaginal discharge found.  Musculoskeletal: She exhibits no edema or deformity.  Neurological: She is alert and oriented to person, place, and time.  Skin: Skin is warm and dry. She is not diaphoretic.  Psychiatric: She has a normal mood and affect.    BP 121/70 (BP Location: Left Arm, Patient Position: Sitting, Cuff Size: Normal)   Pulse 80   Temp 98.5 F (36.9 C) (Oral)   Ht 5\' 7"  (1.702 m)   Wt 185 lb 6.4 oz (84.1 kg)   SpO2 100% Comment: RA  BMI 29.04 kg/m  Wt Readings from Last 3 Encounters:  06/09/16 185 lb 6.4 oz (84.1 kg)  03/17/16 197 lb 9.6 oz (89.6 kg)  01/22/15 196 lb 12.8 oz (89.3 kg)   BP Readings from Last 3 Encounters:  06/09/16 121/70  03/17/16 126/86  02/01/15 (!) 150/90     Immunization History  Administered Date(s) Administered  . Influenza Split 11/11/2011  . Influenza-Unspecified 12/13/2012  . PPD Test 11/11/2011  . Td 02/11/2008    Health Maintenance  Topic Date Due  . HIV Screening  09/15/1965  . DEXA SCAN  09/16/2015  . PNA vac Low Risk Adult (1 of 2 - PCV13) 09/16/2015  . MAMMOGRAM  08/27/2016  . INFLUENZA VACCINE  09/10/2016  . TETANUS/TDAP   02/10/2018  . PAP SMEAR  06/10/2019  . COLONOSCOPY  07/20/2021  . Hepatitis C Screening  Completed    Lab Results  Component Value Date   WBC 6.1 03/17/2016   HGB 14.6 03/17/2016   HCT 42.6 03/17/2016   PLT 235.0 03/17/2016   GLUCOSE 91 03/17/2016   CHOL 221 (H) 03/17/2016   TRIG 180.0 (H) 03/17/2016   HDL 45.00 03/17/2016   LDLDIRECT 162.0 10/30/2014   LDLCALC 140 (H) 03/17/2016   ALT 25 03/17/2016   AST 17 03/17/2016   NA 140 03/17/2016   K 4.3 03/17/2016   CL 110 03/17/2016   CREATININE 0.96 03/17/2016   BUN 27 (H) 03/17/2016   CO2 24 03/17/2016   TSH  8.83 (H) 06/09/2016   HGBA1C 5.5 03/17/2016    Lab Results  Component Value Date   TSH 8.83 (H) 06/09/2016   Lab Results  Component Value Date   WBC 6.1 03/17/2016   HGB 14.6 03/17/2016   HCT 42.6 03/17/2016   MCV 95.7 03/17/2016   PLT 235.0 03/17/2016   Lab Results  Component Value Date   NA 140 03/17/2016   K 4.3 03/17/2016   CO2 24 03/17/2016   GLUCOSE 91 03/17/2016   BUN 27 (H) 03/17/2016   CREATININE 0.96 03/17/2016   BILITOT 0.4 03/17/2016   ALKPHOS 115 03/17/2016   AST 17 03/17/2016   ALT 25 03/17/2016   PROT 7.0 03/17/2016   ALBUMIN 4.5 03/17/2016   CALCIUM 9.7 03/17/2016   ANIONGAP 9 02/15/2014   GFR 61.90 03/17/2016   Lab Results  Component Value Date   CHOL 221 (H) 03/17/2016   Lab Results  Component Value Date   HDL 45.00 03/17/2016   Lab Results  Component Value Date   LDLCALC 140 (H) 03/17/2016   Lab Results  Component Value Date   TRIG 180.0 (H) 03/17/2016   Lab Results  Component Value Date   CHOLHDL 5 03/17/2016   Lab Results  Component Value Date   HGBA1C 5.5 03/17/2016         Assessment & Plan:   Problem List Items Addressed This Visit    Hypothyroidism    Dose adjusted will check labs today      Relevant Orders   TSH (Completed)   Bipolar disorder ([place])    Doing well, stable on current meds, no changes      Cervical cancer screening    Pap today,  no concerns on exam.       Relevant Orders   Cytology - PAP (Completed)   Borderline hyperlipidemia    Encouraged heart healthy diet, increase exercise, avoid trans fats, consider a krill oil cap daily      Hyperglycemia    hgba1c acceptable, minimize simple carbs. Increase exercise as tolerated.       Preventative health care - Primary    Patient encouraged to maintain heart healthy diet, regular exercise, adequate sleep. Consider daily probiotics. Take medications as prescribed       Other Visit Diagnoses    Breast cancer screening       Relevant Orders   MM SCREENING BREAST TOMO BILATERAL   Estrogen deficiency       Relevant Orders   DG Bone Density   Encounter for hepatitis C screening test for low risk patient       Relevant Orders   Hepatitis C antibody (Completed)      I am having Ms. Lampkins maintain her Clobetasol Propionate, risperiDONE, carbamazepine, and amitriptyline.  No orders of the defined types were placed in this encounter.   CMA served as Education administrator during this visit. History, Physical and Plan performed by medical provider. Documentation and orders reviewed and attested to.  Penni Homans, MD

## 2016-06-10 ENCOUNTER — Other Ambulatory Visit: Payer: Self-pay | Admitting: Family Medicine

## 2016-06-10 LAB — CYTOLOGY - PAP
ADEQUACY: ABSENT
Diagnosis: NEGATIVE
HPV: NOT DETECTED

## 2016-06-10 LAB — HEPATITIS C ANTIBODY: HCV Ab: NEGATIVE

## 2016-06-10 MED ORDER — LEVOTHYROXINE SODIUM 112 MCG PO TABS: 112.0000 ug | ORAL_TABLET | Freq: Every day | ORAL | 3 refills | Status: DC

## 2016-06-10 MED FILL — LEVOTHYROXINE 112 MCG TAB: 112 | 30 days supply | Qty: 30 | Fill #0

## 2016-06-11 MED FILL — CARBAMAZEPINE ER 200 MG CAP: 200 | 90 days supply | Qty: 180 | Fill #0

## 2016-06-11 MED FILL — risperiDONE 2 MG TABS: 2 | 90 days supply | Qty: 90 | Fill #0

## 2016-06-11 MED FILL — AMITRIPTYLINE HCL 50 MG TAB: 50 | 90 days supply | Qty: 90 | Fill #0

## 2016-07-04 ENCOUNTER — Ambulatory Visit (INDEPENDENT_AMBULATORY_CARE_PROVIDER_SITE_OTHER): Payer: 59 | Admitting: Family Medicine

## 2016-07-04 ENCOUNTER — Encounter: Payer: Self-pay | Admitting: Family Medicine

## 2016-07-04 VITALS — BP 130/76 | HR 86 | Temp 98.2°F | Resp 18 | Wt 183.3 lb

## 2016-07-04 DIAGNOSIS — L659 Nonscarring hair loss, unspecified: Secondary | ICD-10-CM

## 2016-07-04 DIAGNOSIS — E039 Hypothyroidism, unspecified: Secondary | ICD-10-CM

## 2016-07-04 DIAGNOSIS — R739 Hyperglycemia, unspecified: Secondary | ICD-10-CM | POA: Diagnosis not present

## 2016-07-04 MED ORDER — LEVOTHYROXINE SODIUM 112 MCG PO TABS: 112.0000 ug | ORAL_TABLET | Freq: Every day | ORAL | 1 refills | Status: DC

## 2016-07-04 MED FILL — LEVOTHYROXINE 112 MCG TAB: 112 | 90 days supply | Qty: 90 | Fill #0

## 2016-07-04 NOTE — Patient Instructions (Signed)
Megared krill oil or fish oil or flaxseed oil cap daily Zinc capsule 20-50 mg daily   Alopecia Areata Alopecia areata is a type of hair loss. If you have this condition, you may lose hair on your scalp in patches. In some cases, you may lose all the hair on your scalp (alopecia totalis) or all the hair from your face and body (alopecia universalis).  Alopecia areata is an autoimmune disease. This means your body's defense system (immune system) mistakes normal parts of your body for germs or other things that can make you sick. When you have alopecia areata, your immune system attacks your hair follicles.  Alopecia areata often starts during childhood but can occur at any age. Alopecia areata is not a danger to your health but can be stressful.  CAUSES  The cause of alopecia areata is unknown.  RISK FACTORS You may be at higher risk of alopecia areata if you:   Have a family history of alopecia.  Have a family history of another autoimmune disease, including type 1 diabetes and rheumatoid arthritis. SIGNS AND SYMPTOMS Signs of alopecia areata may include:  Loss of scalp hair in small, round patches. These may be about the size of a quarter.  Loss of all hair on your scalp.  Loss of eyebrow hair, facial hair, or the hair inside your nose (nasal hair).  Hair loss over your entire body. DIAGNOSIS  Alopecia areata may be diagnosed by:  Medical history and physical exam.  Taking a sample of hair to check under a microscope.  Taking a small piece of skin (biopsy) to examine under a microscope.  Blood tests to rule out other autoimmune diseases. TREATMENT  There is no cure for alopecia areata, but the disease often goes away over time. You will not lose the ability to regrow hair. Some medicines may help your hair regrow more quickly. These include:  Corticosteroids. These block inflammation caused by your immune system. You may get this medicine as a lotion for your skin or as an  injection.  Minoxidil. This is a hair growth medicine you can use in areas of hair loss.  Anthralin. This is a medicine for a skin inflammation called psoriasis that may also help alopecia.  Diphencyprone. This medicine is applied to your skin and may stimulate hair growth. HOME CARE INSTRUCTIONS  Use sunscreen or cover your head when outdoors.  Take medicines only as directed by your health care provider.  If you have lost your eyebrows, wear sunglasses outside to keep dust out of your eyes.  If you have lost hair inside your nose, wear a kerchief over your face or apply ointment to the inside of your nose. This keeps out dust and other irritants.  Keep all follow-up visits as directed by your health care provider. This is important. SEEK MEDICAL CARE IF:  Your symptoms change.  You have new symptoms.  You have a reaction to your medicines.  You are struggling emotionally. This information is not intended to replace advice given to you by your health care provider. Make sure you discuss any questions you have with your health care provider. Document Released: 09/01/2003 Document Revised: 02/17/2014 Document Reviewed: 04/18/2013 Elsevier Interactive Patient Education  2017 [employer].

## 2016-07-04 NOTE — Progress Notes (Signed)
Subjective:  I acted as a Education administrator for Dr. Charlett Blake. [place], [place]   Patient ID: Margaret [name], female    DOB: July 15, 1950, 66 y.o.   MRN: [id number]  Chief Complaint  Patient presents with  . Alopecia    HPI  Patient is in today for an acute visit. She recently has a loss of hair that has been progressing for the past month. She noticed this morning she had 4 hands full of hair. She acknowledges that her hair loss seems to be correlating a change in her diet to a lowfat diet. She has also been under increased stress. No recent febrile illness or hospitalizations. No change in medications. Denies CP/palp/SOB/HA/congestion/fevers/GI or GU c/o. Taking meds as prescribed  Patient Care Team: Mosie Lukes, MD as PCP - General (Family Medicine)   Past Medical History:  Diagnosis Date  . Arthritis 02/14/2013  . Bipolar disorder ([place])   . Cerumen impaction 12/26/2012  . Costochondritis 03/17/2016  . Diarrhea 12/26/2012  . Esophageal reflux 09/19/2012  . Hyperglycemia 09/19/2012  . Hypoglycemia   . Kidney stone 11/05/2014  . Other malaise and fatigue 09/19/2012  . Preventative health care 06/09/2016  . PTSD (post-traumatic stress disorder)   . Renal insufficiency 12/26/2012  . Thyroid disease     Past Surgical History:  Procedure Laterality Date  . TUBAL LIGATION     reversal    Family History  Problem Relation Age of Onset  . Diabetes Mother        Brother 1 of 2  . Heart failure Mother   . Prostate cancer Father   . Parkinsonism Father        deceased  . Hypertension Brother   . Esophageal cancer Maternal Grandmother   . Cancer Paternal Grandmother        cancer of jawbone 1 of 2  . Melanoma Brother   . Breast cancer Neg Hx   . Colon cancer Neg Hx     Social History   Social History  . Marital status: Divorced    Spouse name: N/A  . Number of children: N/A  . Years of education: N/A   Occupational History  . Not on file.   Social History Main Topics  . Smoking  status: Former Research scientist (life sciences)  . Smokeless tobacco: Never Used  . Alcohol use No  . Drug use: No  . Sexual activity: Not on file   Other Topics Concern  . Not on file   Social History Narrative  . No narrative on file    Outpatient Medications Prior to Visit  Medication Sig Dispense Refill  . amitriptyline (ELAVIL) 50 MG tablet Take 1 tablet (50 mg total) by mouth at bedtime. 90 tablet 3  . carbamazepine (CARBATROL) 200 MG 12 hr capsule Take 1 capsule (200 mg total) by mouth 2 (two) times daily. 180 capsule 3  . Clobetasol Propionate 0.05 % lotion Apply 1 application topically 2 (two) times daily. 118 mL 1  . risperiDONE (RISPERDAL) 2 MG tablet Take 1 tablet (2 mg total) by mouth at bedtime. 90 tablet 3  . levothyroxine (SYNTHROID, LEVOTHROID) 112 MCG tablet Take 1 tablet (112 mcg total) by mouth daily. 30 tablet 3   No facility-administered medications prior to visit.     Allergies  Allergen Reactions  . Codeine Nausea Only  . Darvon     Hallucinations   . Sulfa Drugs Cross Reactors Nausea And Vomiting    Review of Systems  Constitutional: Negative for fever  and malaise/fatigue.  HENT: Negative for congestion.   Eyes: Negative for blurred vision.  Respiratory: Negative for cough and shortness of breath.   Cardiovascular: Negative for chest pain, palpitations and leg swelling.  Gastrointestinal: Negative for vomiting.  Musculoskeletal: Negative for back pain.  Skin: Negative for rash.  Neurological: Negative for loss of consciousness and headaches.       Objective:    Physical Exam  Constitutional: She is oriented to person, place, and time. She appears well-developed and well-nourished. No distress.  HENT:  Head: Normocephalic and atraumatic.  Eyes: Conjunctivae are normal.  Neck: Normal range of motion. No thyromegaly present.  Cardiovascular: Normal rate and regular rhythm.   Pulmonary/Chest: Effort normal and breath sounds normal. She has no wheezes.  Abdominal:  Soft. Bowel sounds are normal. There is no tenderness.  Musculoskeletal: Normal range of motion. She exhibits no edema or deformity.  Neurological: She is alert and oriented to person, place, and time.  Skin: Skin is warm and dry. She is not diaphoretic.  Psychiatric: She has a normal mood and affect.    BP 130/76 (BP Location: Left Arm, Patient Position: Sitting, Cuff Size: Normal)   Pulse 86   Temp 98.2 F (36.8 C) (Oral)   Resp 18   Wt 183 lb 4.8 oz (83.1 kg)   SpO2 97%   BMI 28.71 kg/m  Wt Readings from Last 3 Encounters:  07/04/16 183 lb 4.8 oz (83.1 kg)  06/09/16 185 lb 6.4 oz (84.1 kg)  03/17/16 197 lb 9.6 oz (89.6 kg)   BP Readings from Last 3 Encounters:  07/04/16 130/76  06/09/16 121/70  03/17/16 126/86     Immunization History  Administered Date(s) Administered  . Influenza Split 11/11/2011  . Influenza-Unspecified 12/13/2012  . PPD Test 11/11/2011  . Td 02/11/2008    Health Maintenance  Topic Date Due  . HIV Screening  09/15/1965  . DEXA SCAN  09/16/2015  . PNA vac Low Risk Adult (1 of 2 - PCV13) 09/16/2015  . MAMMOGRAM  08/27/2016  . INFLUENZA VACCINE  09/10/2016  . TETANUS/TDAP  02/10/2018  . PAP SMEAR  06/10/2019  . COLONOSCOPY  07/20/2021  . Hepatitis C Screening  Completed    Lab Results  Component Value Date   WBC 6.1 03/17/2016   HGB 14.6 03/17/2016   HCT 42.6 03/17/2016   PLT 235.0 03/17/2016   GLUCOSE 91 03/17/2016   CHOL 221 (H) 03/17/2016   TRIG 180.0 (H) 03/17/2016   HDL 45.00 03/17/2016   LDLDIRECT 162.0 10/30/2014   LDLCALC 140 (H) 03/17/2016   ALT 25 03/17/2016   AST 17 03/17/2016   NA 140 03/17/2016   K 4.3 03/17/2016   CL 110 03/17/2016   CREATININE 0.96 03/17/2016   BUN 27 (H) 03/17/2016   CO2 24 03/17/2016   TSH 8.83 (H) 06/09/2016   HGBA1C 5.5 03/17/2016    Lab Results  Component Value Date   TSH 8.83 (H) 06/09/2016   Lab Results  Component Value Date   WBC 6.1 03/17/2016   HGB 14.6 03/17/2016   HCT 42.6  03/17/2016   MCV 95.7 03/17/2016   PLT 235.0 03/17/2016   Lab Results  Component Value Date   NA 140 03/17/2016   K 4.3 03/17/2016   CO2 24 03/17/2016   GLUCOSE 91 03/17/2016   BUN 27 (H) 03/17/2016   CREATININE 0.96 03/17/2016   BILITOT 0.4 03/17/2016   ALKPHOS 115 03/17/2016   AST 17 03/17/2016   ALT 25 03/17/2016  PROT 7.0 03/17/2016   ALBUMIN 4.5 03/17/2016   CALCIUM 9.7 03/17/2016   ANIONGAP 9 02/15/2014   GFR 61.90 03/17/2016   Lab Results  Component Value Date   CHOL 221 (H) 03/17/2016   Lab Results  Component Value Date   HDL 45.00 03/17/2016   Lab Results  Component Value Date   LDLCALC 140 (H) 03/17/2016   Lab Results  Component Value Date   TRIG 180.0 (H) 03/17/2016   Lab Results  Component Value Date   CHOLHDL 5 03/17/2016   Lab Results  Component Value Date   HGBA1C 5.5 03/17/2016         Assessment & Plan:   Problem List Items Addressed This Visit    Hypothyroidism - Primary   Relevant Medications   levothyroxine (SYNTHROID, LEVOTHROID) 112 MCG tablet   Other Relevant Orders   TSH   Hyperglycemia    minimize simple carbs. Increase exercise as tolerated.       Alopecia    Encouraged Zinc, fatty acid and biotin supplements. She has been maintaining a lowfat diet and her hair loss correlates with change in diet. Will continue to monitor and may have to dermatology if persists.        Other Visit Diagnoses    Hair loss disorder       Relevant Orders   Zinc      I am having Ms. Korber maintain her Clobetasol Propionate, risperiDONE, carbamazepine, amitriptyline, and levothyroxine.  Meds ordered this encounter  Medications  . levothyroxine (SYNTHROID, LEVOTHROID) 112 MCG tablet    Sig: Take 1 tablet (112 mcg total) by mouth daily.    Dispense:  90 tablet    Refill:  1    CMA served as Education administrator during this visit. History, Physical and Plan performed by medical provider. Documentation and orders reviewed and attested to.  Penni Homans, MD

## 2016-07-07 NOTE — Assessment & Plan Note (Signed)
minimize simple carbs. Increase exercise as tolerated.  

## 2016-07-07 NOTE — Assessment & Plan Note (Addendum)
Encouraged Zinc, fatty acid and biotin supplements. She has been maintaining a lowfat diet and her hair loss correlates with change in diet. Will continue to monitor and may have to dermatology if persists.

## 2016-07-09 LAB — ZINC: Zinc: 71 ug/dL (ref 60–130)

## 2016-08-08 ENCOUNTER — Other Ambulatory Visit (HOSPITAL_BASED_OUTPATIENT_CLINIC_OR_DEPARTMENT_OTHER): Payer: Self-pay

## 2016-08-08 ENCOUNTER — Ambulatory Visit (HOSPITAL_BASED_OUTPATIENT_CLINIC_OR_DEPARTMENT_OTHER): Payer: Self-pay

## 2016-08-09 ENCOUNTER — Emergency Department (HOSPITAL_COMMUNITY): Payer: 59

## 2016-08-09 ENCOUNTER — Other Ambulatory Visit: Payer: Self-pay

## 2016-08-09 ENCOUNTER — Encounter (HOSPITAL_COMMUNITY): Payer: Self-pay | Admitting: Emergency Medicine

## 2016-08-09 ENCOUNTER — Emergency Department (HOSPITAL_COMMUNITY)
Admission: EM | Admit: 2016-08-09 | Discharge: 2016-08-09 | Disposition: A | Discharge status: Home / Self Care | Payer: 59 | Attending: Emergency Medicine | Admitting: Emergency Medicine

## 2016-08-09 DIAGNOSIS — Z79899 Other long term (current) drug therapy: Secondary | ICD-10-CM | POA: Diagnosis not present

## 2016-08-09 DIAGNOSIS — E039 Hypothyroidism, unspecified: Secondary | ICD-10-CM | POA: Diagnosis not present

## 2016-08-09 DIAGNOSIS — K8020 Calculus of gallbladder without cholecystitis without obstruction: Secondary | ICD-10-CM | POA: Insufficient documentation

## 2016-08-09 DIAGNOSIS — Z87891 Personal history of nicotine dependence: Secondary | ICD-10-CM | POA: Insufficient documentation

## 2016-08-09 DIAGNOSIS — R079 Chest pain, unspecified: Secondary | ICD-10-CM | POA: Diagnosis not present

## 2016-08-09 DIAGNOSIS — R109 Unspecified abdominal pain: Secondary | ICD-10-CM

## 2016-08-09 DIAGNOSIS — R1010 Upper abdominal pain, unspecified: Secondary | ICD-10-CM | POA: Diagnosis not present

## 2016-08-09 LAB — BASIC METABOLIC PANEL
ANION GAP: 9 (ref 5–15)
BUN: 25 mg/dL — ABNORMAL HIGH (ref 6–20)
CHLORIDE: 106 mmol/L (ref 101–111)
CO2: 23 mmol/L (ref 22–32)
Calcium: 9.5 mg/dL (ref 8.9–10.3)
Creatinine, Ser: 0.88 mg/dL (ref 0.44–1.00)
GFR calc non Af Amer: >60 mL/min (ref >60)
Glucose, Bld: 118 mg/dL — ABNORMAL HIGH (ref 65–99)
POTASSIUM: 3.8 mmol/L (ref 3.5–5.1)
Sodium: 138 mmol/L (ref 135–145)

## 2016-08-09 LAB — HEPATIC FUNCTION PANEL
ALK PHOS: 87 U/L (ref 38–126)
ALT: 18 U/L (ref 14–54)
AST: 18 U/L (ref 15–41)
Albumin: 4.2 g/dL (ref 3.5–5.0)
TOTAL PROTEIN: 6.5 g/dL (ref 6.5–8.1)
Total Bilirubin: 0.5 mg/dL (ref 0.3–1.2)

## 2016-08-09 LAB — CBC
HCT: 41.4 % (ref 36.0–46.0)
HEMOGLOBIN: 14.6 g/dL (ref 12.0–15.0)
MCH: 33.2 pg (ref 26.0–34.0)
MCHC: 35.3 g/dL (ref 30.0–36.0)
MCV: 94.1 fL (ref 78.0–100.0)
Platelets: 169 10*3/uL (ref 150–400)
RBC: 4.4 MIL/uL (ref 3.87–5.11)
RDW: 12.1 % (ref 11.5–15.5)
WBC: 12.1 10*3/uL — ABNORMAL HIGH (ref 4.0–10.5)

## 2016-08-09 LAB — POCT I-STAT TROPONIN I
TROPONIN I, POC: 0 ng/mL (ref 0.00–0.08)
Troponin i, poc: 0.03 ng/mL (ref 0.00–0.08)

## 2016-08-09 LAB — LIPASE, BLOOD: Lipase: 50 U/L (ref 11–51)

## 2016-08-09 MED ORDER — HYDROCODONE-ACETAMINOPHEN 5-325 MG PO TABS: 1.0000 | ORAL_TABLET | Freq: Once | ORAL | Status: DC

## 2016-08-09 MED ORDER — ACETAMINOPHEN ER 650 MG PO TBCR: 650.0000 mg | EXTENDED_RELEASE_TABLET | Freq: Three times a day (TID) | ORAL | 0 refills | Status: DC | PRN

## 2016-08-09 MED ORDER — HYDROCODONE-ACETAMINOPHEN 5-325 MG PO TABS: 1.0000 | ORAL_TABLET | Freq: Four times a day (QID) | ORAL | 0 refills | Status: DC | PRN

## 2016-08-09 MED ORDER — ONDANSETRON 4 MG PO TBDP: 4.0000 mg | ORAL_TABLET | Freq: Three times a day (TID) | ORAL | 0 refills | Status: DC | PRN

## 2016-08-09 MED ORDER — SODIUM CHLORIDE 0.9 % IV BOLUS (SEPSIS)
1000.0000 mL | Freq: Once | INTRAVENOUS | Status: AC
Start: 2016-08-09 — End: 2016-08-09
  Administered 2016-08-09: 1000 mL via INTRAVENOUS

## 2016-08-09 MED ORDER — IBUPROFEN 600 MG PO TABS: 600.0000 mg | ORAL_TABLET | Freq: Four times a day (QID) | ORAL | 0 refills | Status: DC | PRN

## 2016-08-09 MED ORDER — FAMOTIDINE IN NACL 20-0.9 MG/50ML-% IV SOLN
20.0000 mg | Freq: Once | INTRAVENOUS | Status: AC
  Administered 2016-08-09: 20 mg via INTRAVENOUS
  Filled 2016-08-09: qty 50

## 2016-08-09 NOTE — Consult Note (Signed)
Reason for Consult:abdominal pain Referring Physician: Dr. Pete Pelt is an 66 y.o. female.  HPI: The patient is a 66 year old white female who presents with upper abdominal pain that started last night. It was associated with nausea and vomiting. She feels a little better now. U/S shows gallstones. Normal lft's  Past Medical History:  Diagnosis Date  . Arthritis 02/14/2013  . Bipolar disorder ([place])   . Cerumen impaction 12/26/2012  . Costochondritis 03/17/2016  . Diarrhea 12/26/2012  . Esophageal reflux 09/19/2012  . Hyperglycemia 09/19/2012  . Hypoglycemia   . Kidney stone 11/05/2014  . Other malaise and fatigue 09/19/2012  . Preventative health care 06/09/2016  . PTSD (post-traumatic stress disorder)   . Renal insufficiency 12/26/2012  . Thyroid disease     Past Surgical History:  Procedure Laterality Date  . TUBAL LIGATION     reversal    Family History  Problem Relation Age of Onset  . Diabetes Mother        Brother 1 of 2  . Heart failure Mother   . Prostate cancer Father   . Parkinsonism Father        deceased  . Hypertension Brother   . Esophageal cancer Maternal Grandmother   . Cancer Paternal Grandmother        cancer of jawbone 1 of 2  . Melanoma Brother   . Breast cancer Neg Hx   . Colon cancer Neg Hx     Social History:  reports that she has quit smoking. She has never used smokeless tobacco. She reports that she does not drink alcohol or use drugs.  Allergies:  Allergies  Allergen Reactions  . Codeine Nausea Only  . Darvon     Hallucinations   . Sulfa Drugs Cross Reactors Nausea And Vomiting    Medications: I have reviewed the patient's current medications.  Results for orders placed or performed during the hospital encounter of 08/09/16 (from the past 48 hour(s))  Basic metabolic panel     Status: Abnormal   Collection Time: 08/09/16  8:27 AM  Result Value Ref Range   Sodium 138 135 - 145 mmol/L   Potassium 3.8 3.5 - 5.1 mmol/L    Chloride 106 101 - 111 mmol/L   CO2 23 22 - 32 mmol/L   Glucose, Bld 118 (H) 65 - 99 mg/dL   BUN 25 (H) 6 - 20 mg/dL   Creatinine, Ser 0.88 0.44 - 1.00 mg/dL   Calcium 9.5 8.9 - 10.3 mg/dL   GFR calc non Af Amer >60 >60 mL/min   GFR calc Af Amer >60 >60 mL/min    Comment: (NOTE) The eGFR has been calculated using the CKD EPI equation. This calculation has not been validated in all clinical situations. eGFR's persistently <60 mL/min signify possible Chronic Kidney Disease.    Anion gap 9 5 - 15  CBC     Status: Abnormal   Collection Time: 08/09/16  8:27 AM  Result Value Ref Range   WBC 12.1 (H) 4.0 - 10.5 K/uL   RBC 4.40 3.87 - 5.11 MIL/uL   Hemoglobin 14.6 12.0 - 15.0 g/dL   HCT 41.4 36.0 - 46.0 %   MCV 94.1 78.0 - 100.0 fL   MCH 33.2 26.0 - 34.0 pg   MCHC 35.3 30.0 - 36.0 g/dL   RDW 12.1 11.5 - 15.5 %   Platelets 169 150 - 400 K/uL  Hepatic function panel     Status: Abnormal  Collection Time: 08/09/16  8:27 AM  Result Value Ref Range   Total Protein 6.5 6.5 - 8.1 g/dL   Albumin 4.2 3.5 - 5.0 g/dL   AST 18 15 - 41 U/L   ALT 18 14 - 54 U/L   Alkaline Phosphatase 87 38 - 126 U/L   Total Bilirubin 0.5 0.3 - 1.2 mg/dL   Bilirubin, Direct <0.1 (L) 0.1 - 0.5 mg/dL   Indirect Bilirubin NOT CALCULATED 0.3 - 0.9 mg/dL  Lipase, blood     Status: None   Collection Time: 08/09/16  8:27 AM  Result Value Ref Range   Lipase 50 11 - 51 U/L  POCT i-Stat troponin I     Status: None   Collection Time: 08/09/16  8:39 AM  Result Value Ref Range   Troponin i, poc 0.03 0.00 - 0.08 ng/mL   Comment 3            Comment: Due to the release kinetics of cTnI, a negative result within the first hours of the onset of symptoms does not rule out myocardial infarction with certainty. If myocardial infarction is still suspected, repeat the test at appropriate intervals.     Dg Chest 2 View  Result Date: 08/09/2016 CLINICAL DATA:  Pt reports she began to have upper back pain last night.  Threw up last night. This am she is having CP, abd pain, and pain worsens with deep breath. EXAM: CHEST  2 VIEW COMPARISON:  None. FINDINGS: The heart size and mediastinal contours are within normal limits. Both lungs are clear. No pleural effusion or pneumothorax. The visualized skeletal structures are unremarkable. IMPRESSION: No active cardiopulmonary disease. Electronically Signed   By: Lajean Manes M.D.   On: 08/09/2016 08:23   US Abdomen Limited Ruq  Result Date: 08/09/2016 CLINICAL DATA:  Atypical chest pain for 1 day. EXAM: ULTRASOUND ABDOMEN LIMITED RIGHT UPPER QUADRANT COMPARISON:  CT on 02/01/2015 FINDINGS: Gallbladder: A few gallstones are seen, largest measuring 1.4 cm. No evidence of gallbladder wall thickening or pericholecystic fluid. No sonographic Murphy sign noted by sonographer. Common bile duct: Diameter: 4 mm, within normal limits. Liver: No focal lesion identified. Within normal limits in parenchymal echogenicity. IMPRESSION: Cholelithiasis, without sonographic signs of cholecystitis or biliary dilatation. Electronically Signed   By: Earle Gell M.D.   On: 08/09/2016 10:20    Review of Systems  Constitutional: Negative.   HENT: Negative.   Eyes: Negative.   Respiratory: Negative.   Cardiovascular: Negative.   Gastrointestinal: Positive for abdominal pain, nausea and vomiting.  Genitourinary: Negative.   Musculoskeletal: Negative.   Skin: Negative.   Neurological: Negative.   Endo/Heme/Allergies: Negative.   Psychiatric/Behavioral: Negative.    Blood pressure (!) 145/92, pulse (!) 114, temperature 97.8 F (36.6 C), temperature source Oral, resp. rate 20, SpO2 97 %. Physical Exam  Constitutional: She is oriented to person, place, and time. She appears well-developed and well-nourished.  HENT:  Head: Normocephalic and atraumatic.  Eyes: Conjunctivae and EOM are normal. Pupils are equal, round, and reactive to light.  Neck: Normal range of motion. Neck supple.   Cardiovascular: Normal rate, regular rhythm and normal heart sounds.   Respiratory: Effort normal and breath sounds normal.  GI: Soft. Bowel sounds are normal. There is no tenderness.  Musculoskeletal: Normal range of motion.  Neurological: She is alert and oriented to person, place, and time. Coordination normal.  Skin: Skin is warm and dry. No rash noted.  Psychiatric: She has a normal mood and affect.  Her behavior is normal. Thought content normal.    Assessment/Plan: The patient has symptomatic gallstones. Because of the risk of further painful episodes and pancreatitis I think she would benefit from having her gallbladder removed. I have discussed with her in detail the risks and benefits of the surgery as well as some of the technical aspects and she understands and wishes to proceed. She must go home and take care of her dog so I will try to plan this for this week while I am the ldow. She agrees to return if her pain worsens  TOTH III,Shakendra Griffeth S 08/09/2016, 11:15 AM

## 2016-08-09 NOTE — ED Notes (Signed)
Pt talked to Dr Marlou Starks about surgery today. Pt sts that she is unable to get anyone to keep her dog today and she will have to reschedule. Pt later sts that she found someone to keep her dog and wants to have surgery today. Dr Marlou Starks was notified and advised this writer that he has sent all his staff home and there is no one her to help him. Pt was notified and has agreed to call Dr Ethlyn Gallery office on Monday and schedule her surgery for this week as originally planned.

## 2016-08-09 NOTE — Discharge Instructions (Signed)
You have gall stones, and they are the cause for your pain. Please see the Surgeon as requested. Please return to the ER if your symptoms worsen; you have increased pain, fevers, chills, inability to keep any medications down.

## 2016-08-09 NOTE — ED Provider Notes (Addendum)
[facility] DEPT Provider Note   CSN: [id number] Arrival date & time: 08/09/16  3299     History   Chief Complaint Chief Complaint  Patient presents with  . Back Pain  . Emesis  . Chest Pain    HPI Margaret [name] is a 66 y.o. female.  HPI Pt with hx of bipolar disorder comes in with cc of chest pain, back pain. Pt reports that her pain started in her back - in between the shoulder blades at 3 pm. At night time she started having anterior chest pain that radiates to the back. She also has burping, nausea with emesis and upper abd pain. Pt took OTC meds with no relief, so she decided to come to the Er. Pt has no CAD hx. Pt's chest pain is worse with deep breath but not with exertion. There is no dib. Pt has no hx of PE, DVT and denies any exogenous hormone (testosterone / estrogen) use, long distance travels or surgery in the past 6 weeks, active cancer, recent immobilization.   Past Medical History:  Diagnosis Date  . Arthritis 02/14/2013  . Bipolar disorder ([place])   . Cerumen impaction 12/26/2012  . Costochondritis 03/17/2016  . Diarrhea 12/26/2012  . Esophageal reflux 09/19/2012  . Hyperglycemia 09/19/2012  . Hypoglycemia   . Kidney stone 11/05/2014  . Other malaise and fatigue 09/19/2012  . Preventative health care 06/09/2016  . PTSD (post-traumatic stress disorder)   . Renal insufficiency 12/26/2012  . Thyroid disease     Patient Active Problem List   Diagnosis Date Noted  . Preventative health care 06/09/2016  . Costochondritis 03/17/2016  . 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 Kidney stone 11/05/2014  . Alopecia 11/07/2013  . 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 Urinary problem 05/30/2013  . Tinea pedis 03/07/2013  . Arthritis 02/14/2013  . Renal insufficiency 12/26/2012  . Diarrhea 12/26/2012  . Other malaise and fatigue 09/19/2012  . Hyperglycemia 09/19/2012  . Esophageal reflux 09/19/2012  . Cervical cancer  screening 01/05/2012  . Borderline hyperlipidemia 01/05/2012  . Hypothyroidism 07/27/2011  . Bipolar disorder ([place]) 07/27/2011    Past Surgical History:  Procedure Laterality Date  . TUBAL LIGATION     reversal    OB History    No data available       Home Medications    Prior to Admission medications   Medication Sig Start Date End Date Taking? Authorizing Provider  amitriptyline (ELAVIL) 50 MG tablet Take 1 tablet (50 mg total) by mouth at bedtime. 05/26/16 05/26/17 Yes Eksir, Richard Miu, MD  Biotin 1000 MCG tablet Take 1,000 mcg by mouth 3 (three) times daily.   Yes [provider]  carbamazepine (CARBATROL) 200 MG 12 hr capsule Take 1 capsule (200 mg total) by mouth 2 (two) times daily. 05/26/16 05/26/17 Yes Eksir, Richard Miu, MD  Clobetasol Propionate 0.05 % lotion Apply 1 application topically 2 (two) times daily. 11/20/14  Yes Mosie Lukes, MD  folic acid (FOLVITE) 242 MCG tablet Take 400 mcg by mouth daily.   Yes [provider]  KRILL OIL OMEGA-3 PO Take 1 capsule by mouth daily.   Yes [provider]  levothyroxine (SYNTHROID, LEVOTHROID) 112 MCG tablet Take 1 tablet (112 mcg total) by mouth daily. 07/04/16  Yes Mosie Lukes, MD  risperiDONE (RISPERDAL) 2 MG tablet Take 1 tablet (2 mg total) by mouth at bedtime. 05/26/16 05/26/17 Yes Eksir, Richard Miu, MD  acetaminophen (TYLENOL 8 HOUR) 650 MG CR tablet Take 1 tablet (650 mg total) by mouth every 8 (eight) hours as needed for pain. 08/09/16   Varney Biles, MD  HYDROcodone-acetaminophen (NORCO/VICODIN) 5-325 MG tablet Take 1 tablet by mouth every 6 (six) hours as needed. 08/09/16   Varney Biles, MD  ibuprofen (ADVIL,MOTRIN) 600 MG tablet Take 1 tablet (600 mg total) by mouth every 6 (six) hours as needed. 08/09/16   Varney Biles, MD  ondansetron (ZOFRAN ODT) 4 MG disintegrating tablet Take 1 tablet (4 mg total) by mouth every 8 (eight) hours as needed for nausea or vomiting.  08/09/16   Varney Biles, MD    Family History Family History  Problem Relation Age of Onset  . Diabetes Mother        Brother 1 of 2  . Heart failure Mother   . Prostate cancer Father   . Parkinsonism Father        deceased  . Hypertension Brother   . Esophageal cancer Maternal Grandmother   . Cancer Paternal Grandmother        cancer of jawbone 1 of 2  . Melanoma Brother   . Breast cancer Neg Hx   . Colon cancer Neg Hx     Social History Social History  Substance Use Topics  . Smoking status: Former Research scientist (life sciences)  . Smokeless tobacco: Never Used  . Alcohol use No     Allergies   Codeine; Darvon; and Sulfa drugs cross reactors   Review of Systems Review of Systems  Constitutional: Positive for activity change.  Cardiovascular: Positive for chest pain.  Gastrointestinal: Positive for abdominal pain, nausea and vomiting.  Musculoskeletal: Positive for back pain.  All other systems reviewed and are negative.    Physical Exam Updated Vital Signs BP (!) 152/73   Pulse (!) 105   Temp 97.8 F (36.6 C) (Oral)   Resp (!) 24   SpO2 99%   Physical Exam  Constitutional: She is oriented to person, place, and time. She appears well-developed.  HENT:  Head: Normocephalic and atraumatic.  Eyes: EOM are normal.  Neck: Normal range of motion. Neck supple.  Cardiovascular: Normal rate and intact distal pulses.   Pulmonary/Chest: Effort normal.  Abdominal: Soft. Bowel sounds are normal. She exhibits no mass. There is tenderness. There is guarding. There is no rebound.  + Murphy's  Neurological: She is alert and oriented to person, place, and time.  Skin: Skin is warm and dry.  Nursing note and vitals reviewed.    ED Treatments / Results  Labs (all labs ordered are listed, but only abnormal results are displayed) Labs Reviewed  BASIC METABOLIC PANEL - Abnormal; Notable for the following:       Result Value   Glucose, Bld 118 (*)    BUN 25 (*)    All other components  within normal limits  CBC - Abnormal; Notable for the following:    WBC 12.1 (*)    All other components within normal limits  HEPATIC FUNCTION PANEL - Abnormal; Notable for the following:    Bilirubin, Direct <0.1 (*)    All other components within normal limits  LIPASE, BLOOD  I-STAT TROPOININ, ED  POCT I-STAT TROPONIN I  I-STAT TROPOININ, ED  POCT I-STAT TROPONIN I    EKG  EKG Interpretation  Date/Time:  Saturday August 09 2016 07:47:55 EDT Ventricular Rate:  119 PR Interval:    QRS Duration: 115 QT Interval:  325 QTC Calculation: 458 R Axis:   -  25 Text Interpretation:  Age not entered, assumed to be  66 years old for purpose of ECG interpretation Sinus tachycardia Incomplete right bundle branch block Low voltage, precordial leads No old tracing to compare Confirmed by Delora Fuel ([id number]) on 08/09/2016 7:53:05 AM       ED ECG REPORT   Date: 08/09/2016  Rate: 101  Rhythm: normal sinus rhythm  QRS Axis: normal  Intervals: normal  ST/T Wave abnormalities: nonspecific ST changes  Conduction Disutrbances:none  Narrative Interpretation:   Old EKG Reviewed: unchanged  I have personally reviewed the EKG tracing and agree with the computerized printout as noted.    Radiology Dg Chest 2 View  Result Date: 08/09/2016 CLINICAL DATA:  Pt reports she began to have upper back pain last night. Threw up last night. This am she is having CP, abd pain, and pain worsens with deep breath. EXAM: CHEST  2 VIEW COMPARISON:  None. FINDINGS: The heart size and mediastinal contours are within normal limits. Both lungs are clear. No pleural effusion or pneumothorax. The visualized skeletal structures are unremarkable. IMPRESSION: No active cardiopulmonary disease. Electronically Signed   By: Lajean Manes M.D.   On: 08/09/2016 08:23   US Abdomen Limited Ruq  Result Date: 08/09/2016 CLINICAL DATA:  Atypical chest pain for 1 day. EXAM: ULTRASOUND ABDOMEN LIMITED RIGHT UPPER QUADRANT  COMPARISON:  CT on 02/01/2015 FINDINGS: Gallbladder: A few gallstones are seen, largest measuring 1.4 cm. No evidence of gallbladder wall thickening or pericholecystic fluid. No sonographic Murphy sign noted by sonographer. Common bile duct: Diameter: 4 mm, within normal limits. Liver: No focal lesion identified. Within normal limits in parenchymal echogenicity. IMPRESSION: Cholelithiasis, without sonographic signs of cholecystitis or biliary dilatation. Electronically Signed   By: Earle Gell M.D.   On: 08/09/2016 10:20    Procedures Procedures (including critical care time)  Medications Ordered in ED Medications  HYDROcodone-acetaminophen (NORCO/VICODIN) 5-325 MG per tablet 1 tablet (not administered)  famotidine (PEPCID) IVPB 20 mg premix (0 mg Intravenous Stopped 08/09/16 1154)  sodium chloride 0.9 % bolus 1,000 mL (0 mLs Intravenous Stopped 08/09/16 1213)     Initial Impression / Assessment and Plan / ED Course  I have reviewed the triage vital signs and the nursing notes.  Pertinent labs & imaging results that were available during my care of the patient were reviewed by me and considered in my medical decision making (see chart for details).  Clinical Course as of Aug 09 1228  Sat Aug 09, 2016  1229 Results from the ER workup discussed with the patient face to face and all questions answered to the best of my ability.  [facility] called, they saw the patient and she has preferred an elective surgery.  Strict ER return precautions have been discussed, and patient is agreeing with the plan and is comfortable with the workup done and the recommendations from the ER.  US Abdomen Limited RUQ [AN]  6045 HR has been elevated, but again, PE suspicion is low. HR elevated likely due to pain, pt has refused iv meds here. Also, trops x 2 ordered and neg. EKG x 2 unchanged.  [AN]    Clinical Course User Index [AN] Varney Biles, MD    Differential diagnosis includes: ACS  syndrome Aortic dissection CHF exacerbation Myocarditis Pericarditis Pneumonia PE Pneumothorax Musculoskeletal pain PUD / Gastritis / Esophagitis Esophageal spasm Cholecystis Pancreatitis  Pt comes in with chest pain radiating the back. Chest pain is pleuritic. Pt has RSR in v1 and v2 and  she is tachycardic, but PE is not high on the ddx. ACS will also be in the ddx, but most likely pt is having cholecystitis or symptomatic cholelithiasis. Pancreatitis also possible.   [place] ordered. Initial eks is not showing STEMI, repeat ordered.   Final Clinical Impressions(s) / ED Diagnoses   Final diagnoses:  Abdominal pain  Calculus of gallbladder without cholecystitis without obstruction    New Prescriptions New Prescriptions   ACETAMINOPHEN (TYLENOL 8 HOUR) 650 MG CR TABLET    Take 1 tablet (650 mg total) by mouth every 8 (eight) hours as needed for pain.   HYDROCODONE-ACETAMINOPHEN (NORCO/VICODIN) 5-325 MG TABLET    Take 1 tablet by mouth every 6 (six) hours as needed.   IBUPROFEN (ADVIL,MOTRIN) 600 MG TABLET    Take 1 tablet (600 mg total) by mouth every 6 (six) hours as needed.   ONDANSETRON (ZOFRAN ODT) 4 MG DISINTEGRATING TABLET    Take 1 tablet (4 mg total) by mouth every 8 (eight) hours as needed for nausea or vomiting.     Varney Biles, MD 08/09/16 6389    Varney Biles, MD 08/09/16 1230

## 2016-08-09 NOTE — ED Triage Notes (Signed)
Pt reports she began to have upper back pain last night. Threw up last night. This am she is having CP, abd pain, and pain worsens with deep breath.

## 2016-08-15 ENCOUNTER — Ambulatory Visit: Payer: Self-pay | Admitting: General Surgery

## 2016-08-18 IMAGING — CT CT ABD-PELV W/ CM
2 of 5 series · 17 of 46 positions shown, 19 images · IV contrast (omnipaque)
Comparison: CT abdomen and pelvis 02/15/2014.

CLINICAL DATA: Episode of vomiting 2 weeks ago with headaches and
pain since that time. Weakness and dizziness. Left flank and lower
quadrant pain since last night. Initial encounter.

EXAM:
CT ABDOMEN AND PELVIS WITH CONTRAST
TECHNIQUE: Multidetector CT imaging of the abdomen and pelvis was performed
using the standard protocol following bolus administration of
intravenous contrast.
CONTRAST:  100 mL OMNIPAQUE IOHEXOL 300 MG/ML  SOLN

[Series 3: rtn a/p with · axial · 0.76mm/px · z∈[-824,-394]mm · 14 of 98 slices shown, 16 images]
[im 6/98  soft-tissue]
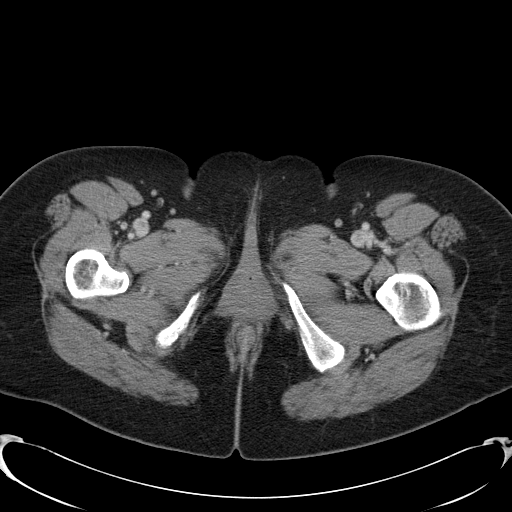
[im 6/98  bone]
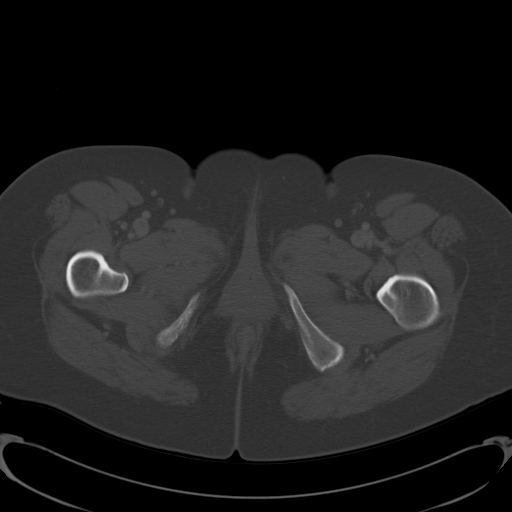
[im 11/98  soft-tissue]
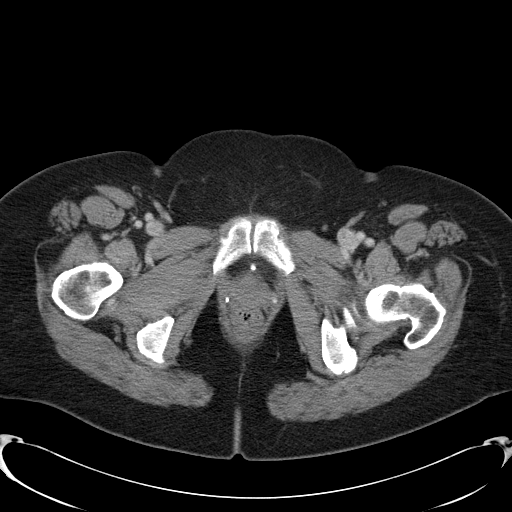
[im 22/98  soft-tissue]
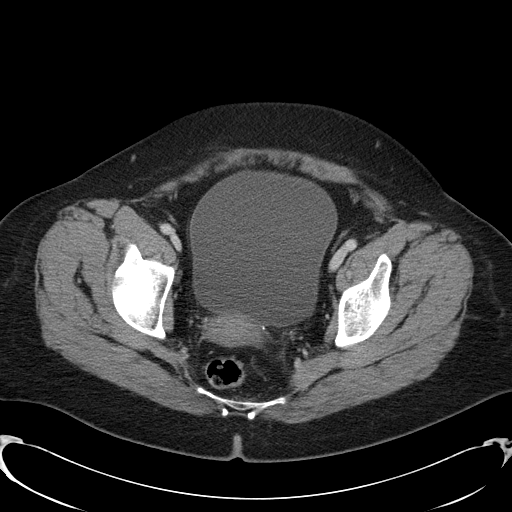
[im 27/98  soft-tissue]
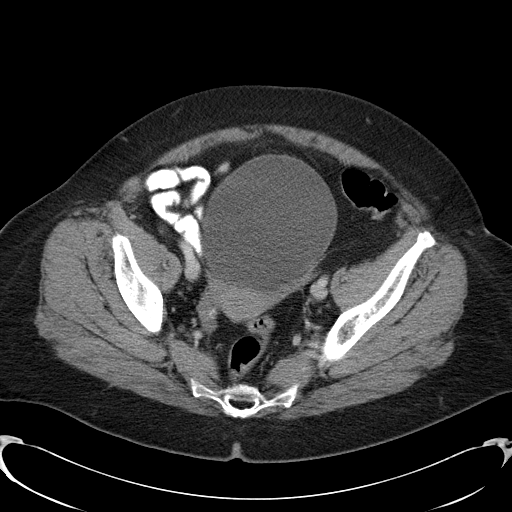
[im 33/98  soft-tissue]
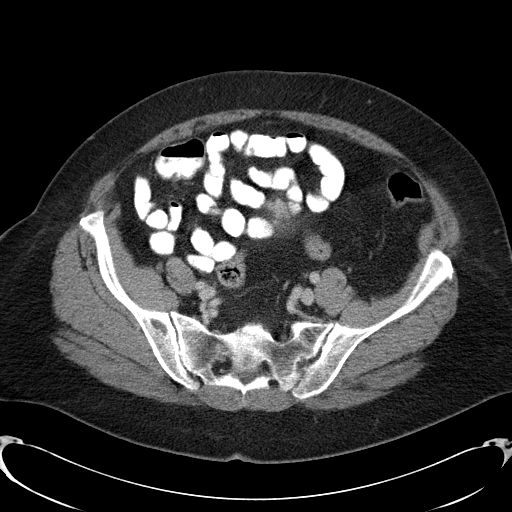
[im 38/98  soft-tissue]
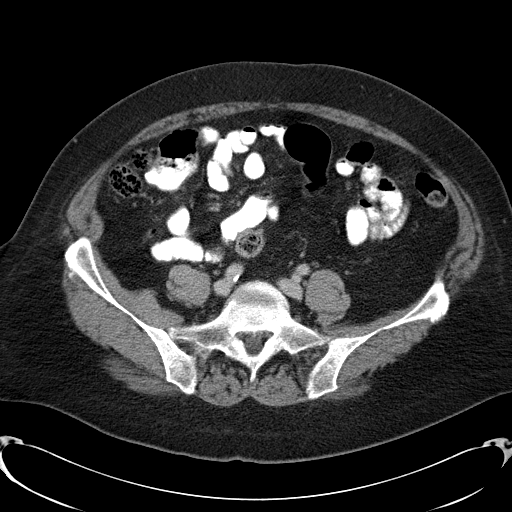
[im 44/98  soft-tissue]
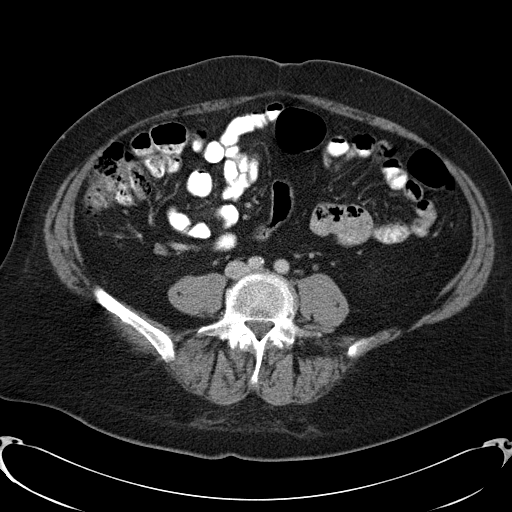
[im 54/98  soft-tissue]
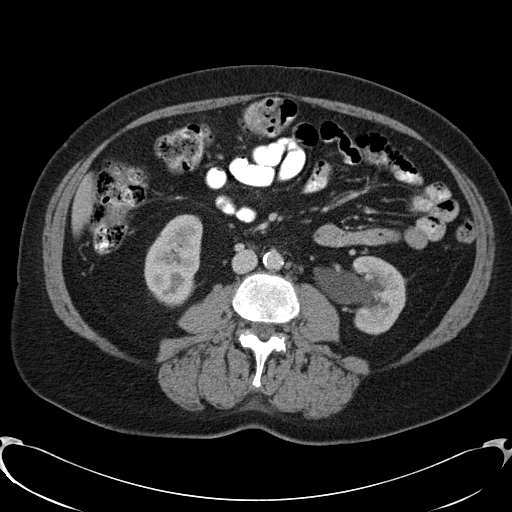
[im 60/98  soft-tissue]
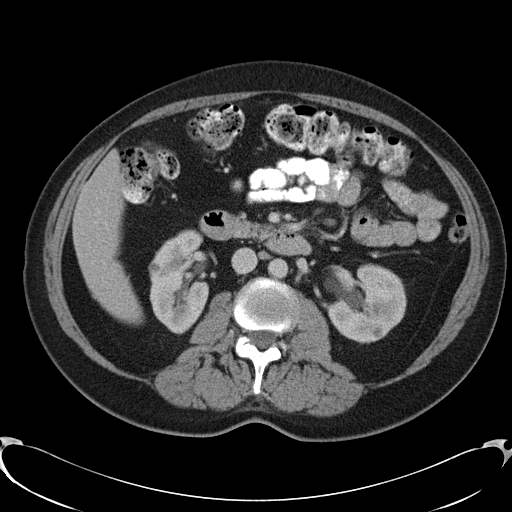
[im 60/98  bone]
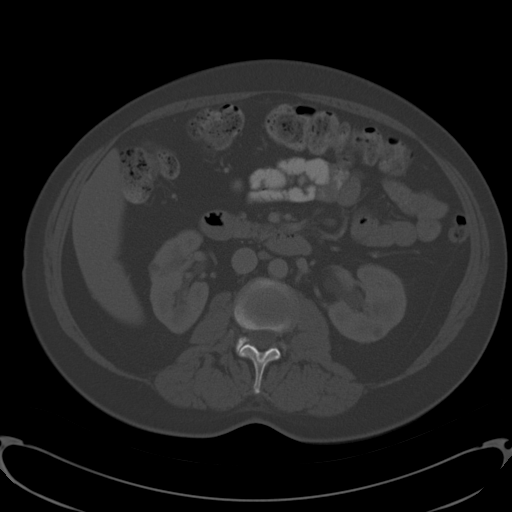
[im 65/98  soft-tissue]
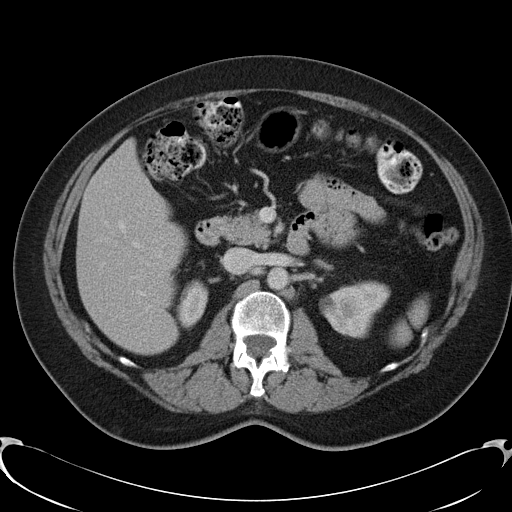
[im 71/98  soft-tissue]
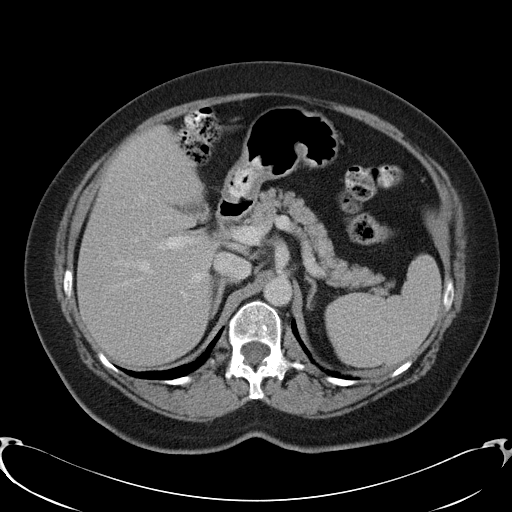
[im 76/98  soft-tissue]
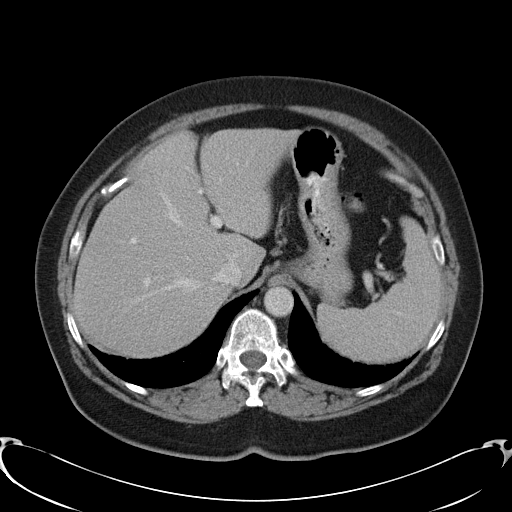
[im 87/98  soft-tissue]
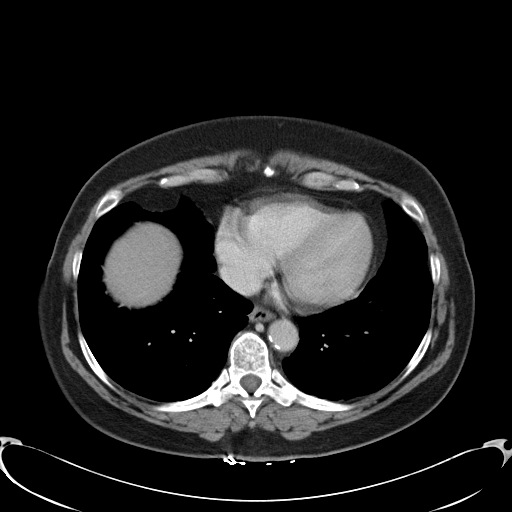
[im 92/98  soft-tissue]
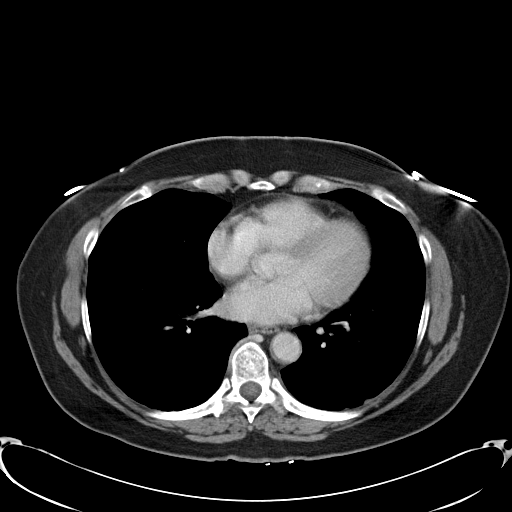

[Series 602: coronal · coronal · 0.96mm/px · 3 of 92 slices shown]
[im 31/92  soft-tissue]
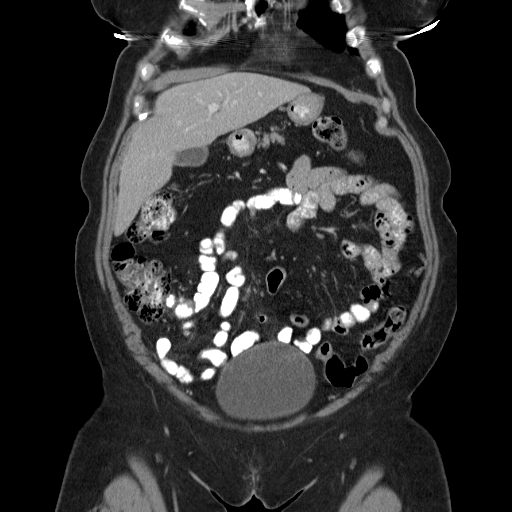
[im 41/92  soft-tissue]
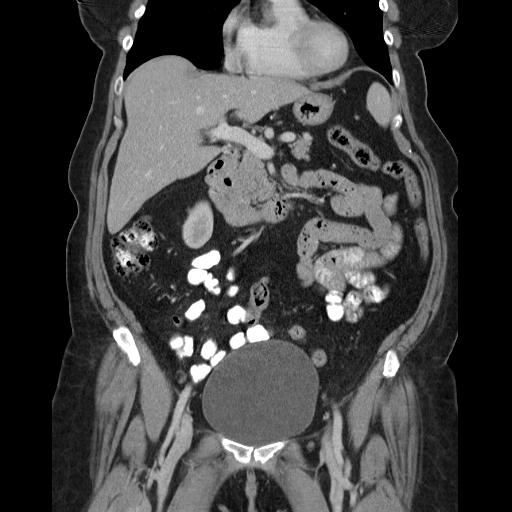
[im 51/92  soft-tissue]
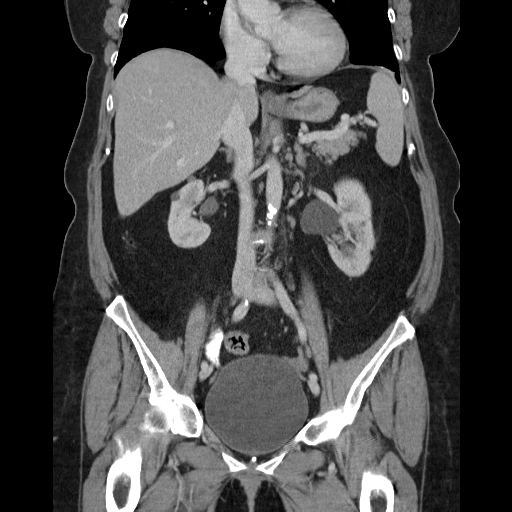

[17 of 46 positions shown; findings below may reference images not displayed]

FINDINGS: Lung bases demonstrate mild dependent atelectatic change. No pleural
or pericardial effusion. Heart size is normal.

There is moderate left hydronephrosis which is improved since the
prior examination. No stone or other cause for this finding is
identified. Question UPJ stricture. Multiple small low attenuating
lesions are seen in both kidneys. Some of these cannot be
definitively characterized but they all likely represent cysts.

The gallbladder, liver, spleen, adrenal glands and pancreas appear
normal. Aortoiliac atherosclerosis without aneurysm is identified.

Uterus, adnexa and urinary bladder appear normal. The stomach, small
and large bowel and appendix are unremarkable. There is no
lymphadenopathy or fluid. No lytic or sclerotic bony lesion is
identified.
IMPRESSION: Moderate left hydronephrosis is improved since the prior
examination. No obstructing lesion is identified. Question UPJ
stricture.

Aortoiliac atherosclerosis without aneurysm.

## 2016-08-21 ENCOUNTER — Other Ambulatory Visit: Payer: Self-pay | Admitting: Family Medicine

## 2016-08-26 ENCOUNTER — Encounter (HOSPITAL_COMMUNITY): Payer: Self-pay

## 2016-08-26 ENCOUNTER — Encounter (HOSPITAL_COMMUNITY)
Admission: RE | Admit: 2016-08-26 | Discharge: 2016-08-26 | Disposition: A | Discharge status: Home / Self Care | Payer: 59 | Source: Ambulatory Visit | Attending: General Surgery | Admitting: General Surgery

## 2016-08-26 DIAGNOSIS — Z01812 Encounter for preprocedural laboratory examination: Secondary | ICD-10-CM | POA: Diagnosis not present

## 2016-08-26 DIAGNOSIS — K8080 Other cholelithiasis without obstruction: Secondary | ICD-10-CM | POA: Diagnosis not present

## 2016-08-26 HISTORY — DX: Other specified postprocedural states: R11.2

## 2016-08-26 HISTORY — DX: Hypothyroidism, unspecified: E03.9

## 2016-08-26 HISTORY — DX: Other specified postprocedural states: Z98.890

## 2016-08-26 HISTORY — DX: Anxiety disorder, unspecified: F41.9

## 2016-08-26 LAB — CBC
HEMATOCRIT: 40.7 % (ref 36.0–46.0)
Hemoglobin: 13.7 g/dL (ref 12.0–15.0)
MCH: 32.3 pg (ref 26.0–34.0)
MCHC: 33.7 g/dL (ref 30.0–36.0)
MCV: 96 fL (ref 78.0–100.0)
Platelets: 244 10*3/uL (ref 150–400)
RBC: 4.24 MIL/uL (ref 3.87–5.11)
RDW: 12.5 % (ref 11.5–15.5)
WBC: 6.8 10*3/uL (ref 4.0–10.5)

## 2016-08-26 LAB — BASIC METABOLIC PANEL
Anion gap: 4 — ABNORMAL LOW (ref 5–15)
BUN: 29 mg/dL — AB (ref 6–20)
CO2: 27 mmol/L (ref 22–32)
Calcium: 9.5 mg/dL (ref 8.9–10.3)
Chloride: 110 mmol/L (ref 101–111)
Creatinine, Ser: 0.88 mg/dL (ref 0.44–1.00)
GFR calc Af Amer: >60 mL/min (ref >60)
GLUCOSE: 114 mg/dL — AB (ref 65–99)
POTASSIUM: 4.5 mmol/L (ref 3.5–5.1)
Sodium: 141 mmol/L (ref 135–145)

## 2016-08-26 NOTE — Pre-Procedure Instructions (Addendum)
Margaret [name]  08/26/2016      Margaret [name], Margaret [name] [address] Phone: [phone] Fax: [phone]    Your procedure is scheduled on July 23,2018 Friday  Report to [facility] Admitting at 10:30 A.M.   Call this number if you have problems the morning of surgery:  [phone]   Remember:  Do not eat food or drink liquids after midnight.   Take these medicines the morning of surgery with A SIP OF WATER Carbamazepine(Carbatrol),levethyroxine(Synthroid),  Drink the 8 oz. Of Boost you were given at pre-admission  2 hours prior to arrival time,(8:30 AM)    STOP ASPIRIN,ANTIINFLAMATORIES (IBUPROFEN,ALEVE,MOTRIN,ADVIL,GOODY'S POWDERS),HERBAL SUPPLEMENTS,FISH OIL,AND VITAMINS 5-7 DAYS PRIOR TO SURGERY   Do not wear jewelry, make-up or nail polish.  Do not wear lotions, powders, or perfumes, or deoderant.  Do not shave 48 hours prior to surgery.  Men may shave face and neck.  Do not bring valuables to the hospital.  [facility] is not responsible for any belongings or valuables.  Contacts, dentures or bridgework may not be worn into surgery.  Leave your suitcase in the car.  After surgery it may be brought to your room.  For patients admitted to the hospital, discharge time will be determined by your treatment team.  Patients discharged the day of surgery will not be allowed to drive home.    Special Instructions: [name] - Preparing for Surgery  Before surgery, you can play an important role.  Because skin is not sterile, your skin needs to be as free of germs as possible.  You can reduce the number of germs on you skin by washing with CHG (chlorahexidine gluconate) soap before surgery.  CHG is an antiseptic cleaner which kills germs and bonds with the skin to continue killing germs even after washing.  Please DO NOT use if you have an allergy to CHG or  antibacterial soaps.  If your skin becomes reddened/irritated stop using the CHG and inform your nurse when you arrive at Short Stay.  Do not shave (including legs and underarms) for at least 48 hours prior to the first CHG shower.  You may shave your face.  Please follow these instructions carefully:   1.  Shower with CHG Soap the night before surgery and the   morning of Surgery.  2.  If you choose to wash your hair, wash your hair first as usual with your normal shampoo.  3.  After you shampoo, rinse your hair and body thoroughly to remove the  Shampoo.  4.  Use CHG as you would any other liquid soap.  You can apply chg directly  to the skin and wash gently with scrungie or a clean washcloth.  5.  Apply the CHG Soap to your body ONLY FROM THE NECK DOWN.   Do not use on open wounds or open sores.  Avoid contact with your eyes,  ears, mouth and genitals (private parts).  Wash genitals (private parts) with your normal soap.  6.  Wash thoroughly, paying special attention to the area where your surgery will be performed.  7.  Thoroughly rinse your body with warm water from the neck down.  8.  DO NOT shower/wash with your normal soap after using and rinsing o  the CHG Soap.  9.  Pat yourself dry with a clean towel.  10.  Wear clean pajamas.            11.  Place clean sheets on your bed the night of your first shower and do not sleep with pets.  Day of Surgery  Do not apply any lotions/deodorants the morning of surgery.  Please wear clean clothes to the hospital/surgery center.   Please read over the following fact sheets that you were given. Coughing and Deep Breathing and Surgical Site Infection Prevention

## 2016-08-29 ENCOUNTER — Other Ambulatory Visit (HOSPITAL_BASED_OUTPATIENT_CLINIC_OR_DEPARTMENT_OTHER): Payer: Self-pay

## 2016-09-01 ENCOUNTER — Ambulatory Visit (HOSPITAL_COMMUNITY): Payer: 59

## 2016-09-01 ENCOUNTER — Encounter (HOSPITAL_COMMUNITY)
Admission: RE | Disposition: A | Discharge status: Home / Self Care | Payer: Self-pay | Source: Ambulatory Visit | Attending: General Surgery

## 2016-09-01 ENCOUNTER — Ambulatory Visit (HOSPITAL_COMMUNITY): Payer: Self-pay | Admitting: Psychiatry

## 2016-09-01 ENCOUNTER — Encounter (HOSPITAL_COMMUNITY): Payer: Self-pay | Admitting: Certified Registered"

## 2016-09-01 ENCOUNTER — Ambulatory Visit (HOSPITAL_COMMUNITY): Payer: 59 | Admitting: Certified Registered"

## 2016-09-01 ENCOUNTER — Ambulatory Visit (HOSPITAL_COMMUNITY)
Admission: RE | Admit: 2016-09-01 | Discharge: 2016-09-01 | Disposition: A | Discharge status: Home / Self Care | Payer: 59 | Source: Ambulatory Visit | Attending: General Surgery | Admitting: General Surgery

## 2016-09-01 DIAGNOSIS — Z8249 Family history of ischemic heart disease and other diseases of the circulatory system: Secondary | ICD-10-CM | POA: Diagnosis not present

## 2016-09-01 DIAGNOSIS — Z882 Allergy status to sulfonamides status: Secondary | ICD-10-CM | POA: Insufficient documentation

## 2016-09-01 DIAGNOSIS — K8010 Calculus of gallbladder with chronic cholecystitis without obstruction: Secondary | ICD-10-CM | POA: Diagnosis not present

## 2016-09-01 DIAGNOSIS — Z888 Allergy status to other drugs, medicaments and biological substances status: Secondary | ICD-10-CM | POA: Insufficient documentation

## 2016-09-01 DIAGNOSIS — Z87891 Personal history of nicotine dependence: Secondary | ICD-10-CM | POA: Diagnosis not present

## 2016-09-01 DIAGNOSIS — R1011 Right upper quadrant pain: Secondary | ICD-10-CM | POA: Diagnosis not present

## 2016-09-01 DIAGNOSIS — Z87442 Personal history of urinary calculi: Secondary | ICD-10-CM | POA: Insufficient documentation

## 2016-09-01 DIAGNOSIS — K824 Cholesterolosis of gallbladder: Secondary | ICD-10-CM | POA: Diagnosis not present

## 2016-09-01 DIAGNOSIS — Z885 Allergy status to narcotic agent status: Secondary | ICD-10-CM | POA: Diagnosis not present

## 2016-09-01 DIAGNOSIS — K8020 Calculus of gallbladder without cholecystitis without obstruction: Secondary | ICD-10-CM | POA: Diagnosis not present

## 2016-09-01 DIAGNOSIS — Z79899 Other long term (current) drug therapy: Secondary | ICD-10-CM | POA: Diagnosis not present

## 2016-09-01 DIAGNOSIS — F319 Bipolar disorder, unspecified: Secondary | ICD-10-CM | POA: Insufficient documentation

## 2016-09-01 DIAGNOSIS — M1990 Unspecified osteoarthritis, unspecified site: Secondary | ICD-10-CM | POA: Diagnosis not present

## 2016-09-01 DIAGNOSIS — F419 Anxiety disorder, unspecified: Secondary | ICD-10-CM | POA: Diagnosis not present

## 2016-09-01 DIAGNOSIS — K219 Gastro-esophageal reflux disease without esophagitis: Secondary | ICD-10-CM | POA: Insufficient documentation

## 2016-09-01 DIAGNOSIS — E039 Hypothyroidism, unspecified: Secondary | ICD-10-CM | POA: Diagnosis not present

## 2016-09-01 DIAGNOSIS — F4310 Post-traumatic stress disorder, unspecified: Secondary | ICD-10-CM | POA: Diagnosis not present

## 2016-09-01 DIAGNOSIS — Z419 Encounter for procedure for purposes other than remedying health state, unspecified: Secondary | ICD-10-CM

## 2016-09-01 HISTORY — PX: CHOLECYSTECTOMY: SHX55

## 2016-09-01 LAB — GLUCOSE, CAPILLARY: GLUCOSE-CAPILLARY: 138 mg/dL — AB (ref 65–99)

## 2016-09-01 SURGERY — LAPAROSCOPIC CHOLECYSTECTOMY WITH INTRAOPERATIVE CHOLANGIOGRAM
Anesthesia: General | Site: Abdomen

## 2016-09-01 MED ORDER — 0.9 % SODIUM CHLORIDE (POUR BTL) OPTIME
TOPICAL | Status: DC | PRN
  Administered 2016-09-01: 1000 mL

## 2016-09-01 MED ORDER — ACETAMINOPHEN 500 MG PO TABS
1000.0000 mg | ORAL_TABLET | ORAL | Status: AC
  Administered 2016-09-01: 1000 mg via ORAL

## 2016-09-01 MED ORDER — ROCURONIUM BROMIDE 100 MG/10ML IV SOLN
INTRAVENOUS | Status: DC | PRN
  Administered 2016-09-01: 60 mg via INTRAVENOUS

## 2016-09-01 MED ORDER — GABAPENTIN 300 MG PO CAPS
ORAL_CAPSULE | ORAL | Status: AC
  Filled 2016-09-01: qty 1

## 2016-09-01 MED ORDER — LACTATED RINGERS IV SOLN: INTRAVENOUS | Status: DC

## 2016-09-01 MED ORDER — IOPAMIDOL (ISOVUE-300) INJECTION 61%
INTRAVENOUS | Status: AC
  Filled 2016-09-01: qty 50

## 2016-09-01 MED ORDER — DEXAMETHASONE SODIUM PHOSPHATE 10 MG/ML IJ SOLN
INTRAMUSCULAR | Status: AC
  Filled 2016-09-01: qty 1

## 2016-09-01 MED ORDER — SODIUM CHLORIDE 0.9 % IR SOLN
Status: DC | PRN
  Administered 2016-09-01: 1000 mL

## 2016-09-01 MED ORDER — LIDOCAINE HCL (CARDIAC) 20 MG/ML IV SOLN
INTRAVENOUS | Status: DC | PRN
  Administered 2016-09-01: 100 mg via INTRAVENOUS

## 2016-09-01 MED ORDER — CEFAZOLIN SODIUM-DEXTROSE 2-4 GM/100ML-% IV SOLN
2.0000 g | INTRAVENOUS | Status: AC
  Administered 2016-09-01: 2 g via INTRAVENOUS

## 2016-09-01 MED ORDER — ONDANSETRON HCL 4 MG/2ML IJ SOLN
INTRAMUSCULAR | Status: AC
  Filled 2016-09-01: qty 2

## 2016-09-01 MED ORDER — PROPOFOL 10 MG/ML IV BOLUS
INTRAVENOUS | Status: AC
  Filled 2016-09-01: qty 20

## 2016-09-01 MED ORDER — NEOSTIGMINE METHYLSULFATE 5 MG/5ML IV SOSY
PREFILLED_SYRINGE | INTRAVENOUS | Status: AC
  Filled 2016-09-01: qty 5

## 2016-09-01 MED ORDER — CEFAZOLIN SODIUM-DEXTROSE 2-4 GM/100ML-% IV SOLN
INTRAVENOUS | Status: AC
  Filled 2016-09-01: qty 100

## 2016-09-01 MED ORDER — ACETAMINOPHEN 500 MG PO TABS
ORAL_TABLET | ORAL | Status: AC
  Filled 2016-09-01: qty 2

## 2016-09-01 MED ORDER — MIDAZOLAM HCL 5 MG/5ML IJ SOLN
INTRAMUSCULAR | Status: DC | PRN
  Administered 2016-09-01: 2 mg via INTRAVENOUS

## 2016-09-01 MED ORDER — LIDOCAINE HCL (CARDIAC) 20 MG/ML IV SOLN
INTRAVENOUS | Status: AC
  Filled 2016-09-01: qty 5

## 2016-09-01 MED ORDER — PROPOFOL 10 MG/ML IV BOLUS
INTRAVENOUS | Status: DC | PRN
  Administered 2016-09-01: 140 mg via INTRAVENOUS

## 2016-09-01 MED ORDER — ROCURONIUM BROMIDE 10 MG/ML (PF) SYRINGE
PREFILLED_SYRINGE | INTRAVENOUS | Status: AC
  Filled 2016-09-01: qty 5

## 2016-09-01 MED ORDER — BUPIVACAINE-EPINEPHRINE (PF) 0.25% -1:200000 IJ SOLN
INTRAMUSCULAR | Status: AC
  Filled 2016-09-01: qty 30

## 2016-09-01 MED ORDER — MIDAZOLAM HCL 2 MG/2ML IJ SOLN
INTRAMUSCULAR | Status: AC
  Filled 2016-09-01: qty 2

## 2016-09-01 MED ORDER — SUGAMMADEX SODIUM 200 MG/2ML IV SOLN
INTRAVENOUS | Status: DC | PRN
  Administered 2016-09-01: 160 mg via INTRAVENOUS

## 2016-09-01 MED ORDER — TRAMADOL HCL 50 MG PO TABS: 50.0000 mg | ORAL_TABLET | Freq: Four times a day (QID) | ORAL | 0 refills | Status: DC | PRN

## 2016-09-01 MED ORDER — HYDROCODONE-ACETAMINOPHEN 5-325 MG PO TABS: 1.0000 | ORAL_TABLET | ORAL | 0 refills | Status: DC | PRN

## 2016-09-01 MED ORDER — CHLORHEXIDINE GLUCONATE CLOTH 2 % EX PADS: 6.0000 | MEDICATED_PAD | Freq: Once | CUTANEOUS | Status: DC

## 2016-09-01 MED ORDER — DEXAMETHASONE SODIUM PHOSPHATE 10 MG/ML IJ SOLN
INTRAMUSCULAR | Status: DC | PRN
  Administered 2016-09-01: 10 mg via INTRAVENOUS

## 2016-09-01 MED ORDER — LACTATED RINGERS IV SOLN
INTRAVENOUS | Status: DC
Start: 2016-09-01 — End: 2016-09-01
  Administered 2016-09-01: 09:00:00 via INTRAVENOUS

## 2016-09-01 MED ORDER — BUPIVACAINE-EPINEPHRINE 0.25% -1:200000 IJ SOLN
INTRAMUSCULAR | Status: DC | PRN
  Administered 2016-09-01: 23 mL

## 2016-09-01 MED ORDER — ONDANSETRON HCL 4 MG/2ML IJ SOLN
INTRAMUSCULAR | Status: DC | PRN
  Administered 2016-09-01: 4 mg via INTRAVENOUS

## 2016-09-01 MED ORDER — PHENYLEPHRINE 40 MCG/ML (10ML) SYRINGE FOR IV PUSH (FOR BLOOD PRESSURE SUPPORT)
PREFILLED_SYRINGE | INTRAVENOUS | Status: DC | PRN
  Administered 2016-09-01: 80 ug via INTRAVENOUS

## 2016-09-01 MED ORDER — SUGAMMADEX SODIUM 200 MG/2ML IV SOLN
INTRAVENOUS | Status: AC
  Filled 2016-09-01: qty 2

## 2016-09-01 MED ORDER — FENTANYL CITRATE (PF) 250 MCG/5ML IJ SOLN
INTRAMUSCULAR | Status: AC
  Filled 2016-09-01: qty 5

## 2016-09-01 MED ORDER — FENTANYL CITRATE (PF) 100 MCG/2ML IJ SOLN
INTRAMUSCULAR | Status: DC | PRN
  Administered 2016-09-01: 50 ug via INTRAVENOUS
  Administered 2016-09-01: 100 ug via INTRAVENOUS

## 2016-09-01 MED ORDER — SODIUM CHLORIDE 0.9 % IV SOLN
INTRAVENOUS | Status: DC | PRN
  Administered 2016-09-01: 5 mL

## 2016-09-01 MED ORDER — GABAPENTIN 300 MG PO CAPS
300.0000 mg | ORAL_CAPSULE | ORAL | Status: AC
  Administered 2016-09-01: 300 mg via ORAL

## 2016-09-01 SURGICAL SUPPLY — 41 items
ADH SKN CLS APL DERMABOND .7 (GAUZE/BANDAGES/DRESSINGS) ×1
APPLIER CLIP 5 13 M/L LIGAMAX5 (MISCELLANEOUS) ×2
APR CLP MED LRG 5 ANG JAW (MISCELLANEOUS) ×1
BAG SPEC RTRVL LRG 6X4 10 (ENDOMECHANICALS) ×1
BLADE CLIPPER SURG (BLADE) IMPLANT
CANISTER SUCT 3000ML PPV (MISCELLANEOUS) ×2 IMPLANT
CATH REDDICK CHOLANGI 4FR 50CM (CATHETERS) ×2 IMPLANT
CHLORAPREP W/TINT 26ML (MISCELLANEOUS) ×2 IMPLANT
CLIP APPLIE 5 13 M/L LIGAMAX5 (MISCELLANEOUS) ×1 IMPLANT
COVER MAYO STAND STRL (DRAPES) ×2 IMPLANT
COVER SURGICAL LIGHT HANDLE (MISCELLANEOUS) ×2 IMPLANT
DERMABOND ADVANCED (GAUZE/BANDAGES/DRESSINGS) ×1
DERMABOND ADVANCED .7 DNX12 (GAUZE/BANDAGES/DRESSINGS) ×1 IMPLANT
DRAPE C-ARM 42X72 X-RAY (DRAPES) ×2 IMPLANT
ELECT REM PT RETURN 9FT ADLT (ELECTROSURGICAL) ×2
ELECTRODE REM PT RTRN 9FT ADLT (ELECTROSURGICAL) ×1 IMPLANT
GLOVE BIO SURGEON STRL SZ7 (GLOVE) ×1 IMPLANT
GLOVE BIO SURGEON STRL SZ7.5 (GLOVE) ×2 IMPLANT
GLOVE ECLIPSE 8.0 STRL XLNG CF (GLOVE) ×1 IMPLANT
GLOVE INDICATOR 7.0 STRL GRN (GLOVE) ×4 IMPLANT
GOWN STRL REUS W/ TWL LRG LVL3 (GOWN DISPOSABLE) ×3 IMPLANT
GOWN STRL REUS W/ TWL XL LVL3 (GOWN DISPOSABLE) IMPLANT
GOWN STRL REUS W/TWL LRG LVL3 (GOWN DISPOSABLE) ×4
GOWN STRL REUS W/TWL XL LVL3 (GOWN DISPOSABLE) ×2
IV CATH 14GX2 1/4 (CATHETERS) ×2 IMPLANT
KIT BASIN OR (CUSTOM PROCEDURE TRAY) ×2 IMPLANT
KIT ROOM TURNOVER OR (KITS) ×2 IMPLANT
NS IRRIG 1000ML POUR BTL (IV SOLUTION) ×2 IMPLANT
PAD ARMBOARD 7.5X6 YLW CONV (MISCELLANEOUS) ×2 IMPLANT
POUCH SPECIMEN RETRIEVAL 10MM (ENDOMECHANICALS) ×2 IMPLANT
SCISSORS LAP 5X35 DISP (ENDOMECHANICALS) ×2 IMPLANT
SET IRRIG TUBING LAPAROSCOPIC (IRRIGATION / IRRIGATOR) ×2 IMPLANT
SLEEVE ENDOPATH XCEL 5M (ENDOMECHANICALS) ×4 IMPLANT
SPECIMEN JAR SMALL (MISCELLANEOUS) ×2 IMPLANT
SUT MNCRL AB 4-0 PS2 18 (SUTURE) ×2 IMPLANT
TOWEL OR 17X24 6PK STRL BLUE (TOWEL DISPOSABLE) ×2 IMPLANT
TOWEL OR 17X26 10 PK STRL BLUE (TOWEL DISPOSABLE) ×2 IMPLANT
TRAY LAPAROSCOPIC MC (CUSTOM PROCEDURE TRAY) ×2 IMPLANT
TROCAR XCEL BLUNT TIP 100MML (ENDOMECHANICALS) ×2 IMPLANT
TROCAR XCEL NON-BLD 5MMX100MML (ENDOMECHANICALS) ×2 IMPLANT
TUBING INSUFFLATION (TUBING) ×2 IMPLANT

## 2016-09-01 NOTE — Transfer of Care (Cosign Needed)
Immediate Anesthesia Transfer of Care Note  Patient: Margaret [name]  Procedure(s) Performed: Procedure(s): LAPAROSCOPIC CHOLECYSTECTOMY WITH INTRAOPERATIVE CHOLANGIOGRAM (N/A)  Patient Location: PACU  Anesthesia Type:General  Level of Consciousness: drowsy and patient cooperative  Airway & Oxygen Therapy: Patient Spontanous Breathing and Patient connected to nasal cannula oxygen  Post-op Assessment: Report given to RN and Post -op Vital signs reviewed and stable  Post vital signs: Reviewed and stable  Last Vitals:  Vitals:   09/01/16 0828 09/01/16 1122  BP:    Pulse: (!) 109   Resp:    Temp:  (P) 36.9 C    Last Pain:  Vitals:   09/01/16 0827  TempSrc: Oral      Patients Stated Pain Goal: 2 (01/28/74 8832)  Complications: No apparent anesthesia complications

## 2016-09-01 NOTE — Interval H&P Note (Signed)
History and Physical Interval Note:  09/01/2016 9:52 AM  Margaret [name]  has presented today for surgery, with the diagnosis of Gallstones  The various methods of treatment have been discussed with the patient and family. After consideration of risks, benefits and other options for treatment, the patient has consented to  Procedure(s): LAPAROSCOPIC CHOLECYSTECTOMY WITH INTRAOPERATIVE CHOLANGIOGRAM (N/A) as a surgical intervention .  The patient's history has been reviewed, patient examined, no change in status, stable for surgery.  I have reviewed the patient's chart and labs.  Questions were answered to the patient's satisfaction.     TOTH III,Marcoantonio Legault S

## 2016-09-01 NOTE — H&P (Signed)
**Note Margaret via Obfuscation** Reason for Consult:abdominal pain Referring Physician: Dr. Pete Pelt is an 66 y.o. female.  HPI: Margaret [name] is a 66 year old white female who presents with upper abdominal pain Margaret started last night. It was associated with nausea Margaret vomiting. She feels a little better now. U/S shows gallstones. Normal lft's      Past Medical History:  Diagnosis Date  . Arthritis 02/14/2013  . Bipolar disorder ([place])   . Cerumen impaction 12/26/2012  . Costochondritis 03/17/2016  . Diarrhea 12/26/2012  . Esophageal reflux 09/19/2012  . Hyperglycemia 09/19/2012  . Hypoglycemia   . Kidney stone 11/05/2014  . Other malaise Margaret fatigue 09/19/2012  . Preventative health care 06/09/2016  . PTSD (post-traumatic stress disorder)   . Renal insufficiency 12/26/2012  . Thyroid disease          Past Surgical History:  Procedure Laterality Date  . TUBAL LIGATION     reversal         Family History  Problem Relation Age of Onset  . Diabetes Mother        Brother 1 of 2  . Heart failure Mother   . Prostate cancer Father   . Parkinsonism Father        deceased  . Hypertension Brother   . Esophageal cancer Maternal Grandmother   . Cancer Paternal Grandmother        cancer of jawbone 1 of 2  . Melanoma Brother   . Breast cancer Neg Hx   . Colon cancer Neg Hx     Social History:  reports Margaret she has quit smoking. She has never used smokeless tobacco. She reports Margaret she does Margaret drink alcohol or use drugs.  Allergies:       Allergies  Allergen Reactions  . Codeine Nausea Only  . Darvon     Hallucinations   . Sulfa Drugs Cross Reactors Nausea Margaret Vomiting    Medications: I have reviewed Margaret [name]'s current medications.  LabResultsLast48Hours        Results for orders placed or performed during Margaret hospital encounter of 08/09/16 (from Margaret past 48 hour(s))  Basic metabolic panel     Status: Abnormal   Collection Time: 08/09/16  8:27 AM   Result Value Ref Range   Sodium 138 135 - 145 mmol/L   Potassium 3.8 3.5 - 5.1 mmol/L   Chloride 106 101 - 111 mmol/L   CO2 23 22 - 32 mmol/L   Glucose, Bld 118 (H) 65 - 99 mg/dL   BUN 25 (H) 6 - 20 mg/dL   Creatinine, Ser 0.88 0.44 - 1.00 mg/dL   Calcium 9.5 8.9 - 10.3 mg/dL   GFR calc non Af Amer >60 >60 mL/min   GFR calc Af Amer >60 >60 mL/min    Comment: (NOTE) Margaret eGFR has been calculated using Margaret CKD EPI equation. This calculation has Margaret been validated in all clinical situations. eGFR's persistently <60 mL/min signify possible Chronic Kidney Disease.    Anion gap 9 5 - 15  CBC     Status: Abnormal   Collection Time: 08/09/16  8:27 AM  Result Value Ref Range   WBC 12.1 (H) 4.0 - 10.5 K/uL   RBC 4.40 3.87 - 5.11 MIL/uL   Hemoglobin 14.6 12.0 - 15.0 g/dL   HCT 41.4 36.0 - 46.0 %   MCV 94.1 78.0 - 100.0 fL   MCH 33.2 26.0 - 34.0 pg   MCHC 35.3 30.0 - 36.0 g/dL  RDW 12.1 11.5 - 15.5 %   Platelets 169 150 - 400 K/uL  Hepatic function panel     Status: Abnormal   Collection Time: 08/09/16  8:27 AM  Result Value Ref Range   Total Protein 6.5 6.5 - 8.1 g/dL   Albumin 4.2 3.5 - 5.0 g/dL   AST 18 15 - 41 U/L   ALT 18 14 - 54 U/L   Alkaline Phosphatase 87 38 - 126 U/L   Total Bilirubin 0.5 0.3 - 1.2 mg/dL   Bilirubin, Direct <0.1 (L) 0.1 - 0.5 mg/dL   Indirect Bilirubin Margaret CALCULATED 0.3 - 0.9 mg/dL  Lipase, blood     Status: [name]   Collection Time: 08/09/16  8:27 AM  Result Value Ref Range   Lipase 50 11 - 51 U/L  POCT i-Stat troponin I     Status: [name]   Collection Time: 08/09/16  8:39 AM  Result Value Ref Range   Troponin i, poc 0.03 0.00 - 0.08 ng/mL   Comment 3            Comment: Due to Margaret release kinetics of cTnI, a negative result within Margaret first hours of Margaret onset of symptoms does Margaret rule out myocardial infarction with certainty. If myocardial infarction is still suspected, repeat Margaret test at appropriate  intervals.        ImagingResults(Last48hours)  Dg Chest 2 View  Result Date: 08/09/2016 CLINICAL DATA:  Pt reports she began to have upper back pain last night. Threw up last night. This am she is having CP, abd pain, Margaret pain worsens with deep breath. EXAM: CHEST  2 VIEW COMPARISON:  [name]. FINDINGS: Margaret heart size Margaret mediastinal contours are within normal limits. Both lungs are clear. No pleural effusion or pneumothorax. Margaret visualized skeletal structures are unremarkable. IMPRESSION: No active cardiopulmonary disease. Electronically Signed   By: Lajean Manes M.D.   On: 08/09/2016 08:23   US Abdomen Limited Ruq  Result Date: 08/09/2016 CLINICAL DATA:  Atypical chest pain for 1 day. EXAM: ULTRASOUND ABDOMEN LIMITED RIGHT UPPER QUADRANT COMPARISON:  CT on 02/01/2015 FINDINGS: Gallbladder: A few gallstones are seen, largest measuring 1.4 cm. No evidence of gallbladder wall thickening or pericholecystic fluid. No sonographic Murphy sign noted by sonographer. Common bile duct: Diameter: 4 mm, within normal limits. Liver: No focal lesion identified. Within normal limits in parenchymal echogenicity. IMPRESSION: Cholelithiasis, without sonographic signs of cholecystitis or biliary dilatation. Electronically Signed   By: Earle Gell M.D.   On: 08/09/2016 10:20     Review of Systems  Constitutional: Negative.   HENT: Negative.   Eyes: Negative.   Respiratory: Negative.   Cardiovascular: Negative.   Gastrointestinal: Positive for abdominal pain, nausea Margaret vomiting.  Genitourinary: Negative.   Musculoskeletal: Negative.   Skin: Negative.   Neurological: Negative.   Endo/Heme/Allergies: Negative.   Psychiatric/Behavioral: Negative.    Blood pressure (!) 145/92, pulse (!) 114, temperature 97.8 F (36.6 C), temperature source Oral, resp. rate 20, SpO2 97 %. Physical Exam  Constitutional: She is oriented to person, place, Margaret time. She appears well-developed Margaret well-nourished.   HENT:  Head: Normocephalic Margaret atraumatic.  Eyes: Conjunctivae Margaret EOM are normal. Pupils are equal, round, Margaret reactive to light.  Neck: Normal range of motion. Neck supple.  Cardiovascular: Normal rate, regular rhythm Margaret normal heart sounds.   Respiratory: Effort normal Margaret breath sounds normal.  GI: Soft. Bowel sounds are normal. There is no tenderness.  Musculoskeletal: Normal range of motion.  Neurological: She is alert Margaret oriented to person, place, Margaret time. Coordination normal.  Skin: Skin is warm Margaret dry. No rash noted.  Psychiatric: She has a normal mood Margaret affect. Her behavior is normal. Thought content normal.    Assessment/Plan: Margaret [name] has symptomatic gallstones. Because of Margaret risk of further painful episodes Margaret pancreatitis I think she would benefit from having her gallbladder removed. I have discussed with her in detail Margaret risks Margaret benefits of Margaret surgery as well as some of Margaret technical aspects Margaret she understands Margaret wishes to proceed. She must go home Margaret take care of her dog so I will try to plan this for this week while I am Margaret ldow. She agrees to return if her pain worsens

## 2016-09-01 NOTE — Anesthesia Preprocedure Evaluation (Addendum)
Anesthesia Evaluation  Patient identified by MRN, date of birth, ID band Patient awake    Reviewed: Allergy & Precautions, NPO status , Patient's Chart, lab work & pertinent test results  History of Anesthesia Complications (+) PONV and history of anesthetic complications  Airway Mallampati: II  TM Distance: >3 FB Neck ROM: Full    Dental no notable dental hx. (+) Dental Advisory Given   Pulmonary former smoker,    Pulmonary exam normal        Cardiovascular negative cardio ROS Normal cardiovascular exam     Neuro/Psych PSYCHIATRIC DISORDERS Anxiety Bipolar Disorder negative neurological ROS     GI/Hepatic Neg liver ROS, GERD  ,  Endo/Other  Hypothyroidism   Renal/GU      Musculoskeletal   Abdominal   Peds  Hematology   Anesthesia Other Findings   Reproductive/Obstetrics                             Anesthesia Physical Anesthesia Plan  ASA: II  Anesthesia Plan: General   Post-op Pain Management:    Induction: Intravenous  PONV Risk Score and Plan: 3 and 4 or greater and Ondansetron, Dexamethasone and Diphenhydramine  Airway Management Planned: Oral ETT  Additional Equipment:   Intra-op Plan:   Post-operative Plan: Extubation in OR  Informed Consent: I have reviewed the patients History and Physical, chart, labs and discussed the procedure including the risks, benefits and alternatives for the proposed anesthesia with the patient or authorized representative who has indicated his/her understanding and acceptance.   Dental advisory given  Plan Discussed with: CRNA, Anesthesiologist and Surgeon  Anesthesia Plan Comments:        Anesthesia Quick Evaluation

## 2016-09-01 NOTE — Op Note (Signed)
09/01/2016  11:10 AM  PATIENT:  Margaret [name]  66 y.o. female  PRE-OPERATIVE DIAGNOSIS:  Gallstones  POST-OPERATIVE DIAGNOSIS:  Gallstones  PROCEDURE:  Procedure(s): LAPAROSCOPIC CHOLECYSTECTOMY WITH INTRAOPERATIVE CHOLANGIOGRAM (N/A)  SURGEON:  Surgeon(s) and Role:    * Jovita Kussmaul, MD - Primary  PHYSICIAN ASSISTANT:   ASSISTANTS: none   ANESTHESIA:   local and general  EBL:  Total I/O In: -  Out: 20 [Blood:20]  BLOOD ADMINISTERED:none  DRAINS: none   LOCAL MEDICATIONS USED:  MARCAINE     SPECIMEN:  Source of Specimen:  gallbladder  DISPOSITION OF SPECIMEN:  PATHOLOGY  COUNTS:  YES  TOURNIQUET:  * No tourniquets in log *  DICTATION: .Dragon Dictation   Procedure: After informed consent was obtained the patient was brought to the operating room and placed in the supine position on the operating room table. After adequate induction of general anesthesia the patient's abdomen was prepped with ChloraPrep allowed to dry and draped in usual sterile manner. An appropriate timeout was performed. The area below the umbilicus was infiltrated with quarter percent  Marcaine. A small incision was made with a 15 blade knife. The incision was carried down through the subcutaneous tissue bluntly with a hemostat and [employer] retractors. The linea alba was identified. The linea alba was incised with a 15 blade knife and each side was grasped with Coker clamps. The preperitoneal space was then probed with a hemostat until the peritoneum was opened and access was gained to the abdominal cavity. A 0 Vicryl pursestring stitch was placed in the fascia surrounding the opening. A Hassan cannula was then placed through the opening and anchored in place with the previously placed Vicryl purse string stitch. The abdomen was insufflated with carbon dioxide without difficulty. A laparoscope was inserted through the [facility] cannula in the right upper quadrant was inspected. Next the epigastric region was  infiltrated with % Marcaine. A small incision was made with a 15 blade knife. A 5 mm port was placed bluntly through this incision into the abdominal cavity under direct vision. Next 2 sites were chosen laterally on the right side of the abdomen for placement of 5 mm ports. Each of these areas was infiltrated with quarter percent Marcaine. Small stab incisions were made with a 15 blade knife. 5 mm ports were then placed bluntly through these incisions into the abdominal cavity under direct vision without difficulty. A blunt grasper was placed through the lateralmost 5 mm port and used to grasp the dome of the gallbladder and elevated anteriorly and superiorly. Another blunt grasper was placed through the other 5 mm port and used to retract the body and neck of the gallbladder. A dissector was placed through the epigastric port and using the electrocautery the peritoneal reflection at the gallbladder neck was opened. Blunt dissection was then carried out in this area until the gallbladder neck-cystic duct junction was readily identified and a good window was created. A single clip was placed on the gallbladder neck. A small  ductotomy was made just below the clip with laparoscopic scissors. A 14-gauge Angiocath was then placed through the anterior abdominal wall under direct vision. A Reddick cholangiogram catheter was then placed through the Angiocath and flushed. The catheter was then placed in the cystic duct and anchored in place with a clip. A cholangiogram was obtained that showed no filling defects good emptying into the duodenum an adequate length on the cystic duct. The anchoring clip and catheters were then removed from the  patient. 3 clips were placed proximally on the cystic duct and the duct was divided between the 2 sets of clips. Posterior to this the cystic artery was identified and again dissected bluntly in a circumferential manner until a good window  was created. 2 clips were placed proximally  and one distally on the artery and the artery was divided between the 2 sets of clips. Next a laparoscopic hook cautery device was used to separate the gallbladder from the liver bed. Prior to completely detaching the gallbladder from the liver bed the liver bed was inspected and several small bleeding points were coagulated with the electrocautery until the area was completely hemostatic. The gallbladder was then detached the rest of it from the liver bed without difficulty. A laparoscopic bag was inserted through the hassan port. The laparoscope was moved to the epigastric port. The gallbladder was placed within the bag and the bag was sealed.  The bag with the gallbladder was then removed with the [facility] cannula through the infraumbilical port without difficulty. The fascial defect was then closed with the previously placed Vicryl pursestring stitch as well as with another figure-of-eight 0 Vicryl stitch. The liver bed was inspected again and found to be hemostatic. The abdomen was irrigated with copious amounts of saline until the effluent was clear. The ports were then removed under direct vision without difficulty and were found to be hemostatic. The gas was allowed to escape. The skin incisions were all closed with interrupted 4-0 Monocryl subcuticular stitches. Dermabond dressings were applied. The patient tolerated the procedure well. At the end of the case all needle sponge and instrument counts were correct. The patient was then awakened and taken to recovery in stable condition  PLAN OF CARE: Discharge to home after PACU  PATIENT DISPOSITION:  PACU - hemodynamically stable.   Delay start of Pharmacological VTE agent (>24hrs) due to surgical blood loss or risk of bleeding: not applicable

## 2016-09-01 NOTE — Anesthesia Procedure Notes (Cosign Needed)
Procedure Name: Intubation Date/Time: 09/01/2016 10:19 AM Performed by: Duane Boston Pre-anesthesia Checklist: Patient identified, Emergency Drugs available, Suction available and Patient being monitored Patient Re-evaluated:Patient Re-evaluated prior to induction Oxygen Delivery Method: Circle system utilized Preoxygenation: Pre-oxygenation with 100% oxygen Induction Type: IV induction Laryngoscope Size: Miller and 2 Grade View: Grade I Tube type: Oral Tube size: 7.0 mm Number of attempts: 1 Airway Equipment and Method: Stylet Placement Confirmation: ETT inserted through vocal cords under direct vision,  positive ETCO2 and breath sounds checked- equal and bilateral Secured at: 22 cm Tube secured with: Tape Dental Injury: Teeth and Oropharynx as per pre-operative assessment

## 2016-09-01 NOTE — Anesthesia Postprocedure Evaluation (Signed)
Anesthesia Post Note  Patient: Margaret [name]  Procedure(s) Performed: Procedure(s) (LRB): LAPAROSCOPIC CHOLECYSTECTOMY WITH INTRAOPERATIVE CHOLANGIOGRAM (N/A)     Patient location during evaluation: PACU Anesthesia Type: General Level of consciousness: sedated Pain management: pain level controlled Vital Signs Assessment: post-procedure vital signs reviewed and stable Respiratory status: spontaneous breathing and respiratory function stable Cardiovascular status: stable Anesthetic complications: no    Last Vitals:  Vitals:   09/01/16 1150 09/01/16 1200  BP: 131/60 133/65  Pulse: 90 90  Resp: (!) 30 (!) 22  Temp:  (!) 36.1 C    Last Pain:  Vitals:   09/01/16 1122  TempSrc:   PainSc: Asleep                 Hugo Lybrand DANIEL

## 2016-09-02 ENCOUNTER — Ambulatory Visit (HOSPITAL_BASED_OUTPATIENT_CLINIC_OR_DEPARTMENT_OTHER): Payer: Self-pay

## 2016-09-02 ENCOUNTER — Encounter (HOSPITAL_COMMUNITY): Payer: Self-pay | Admitting: General Surgery

## 2016-09-02 ENCOUNTER — Other Ambulatory Visit (HOSPITAL_BASED_OUTPATIENT_CLINIC_OR_DEPARTMENT_OTHER): Payer: Self-pay

## 2016-09-08 MED FILL — AMITRIPTYLINE HCL 50 MG TAB: 50 | 90 days supply | Qty: 90 | Fill #1

## 2016-09-08 MED FILL — risperiDONE 2 MG TABS: 2 | 90 days supply | Qty: 90 | Fill #1

## 2016-09-08 MED FILL — CARBAMAZEPINE ER 200 MG CAP: 200 | 90 days supply | Qty: 180 | Fill #1

## 2016-09-15 ENCOUNTER — Other Ambulatory Visit: Payer: Self-pay

## 2016-09-19 ENCOUNTER — Ambulatory Visit: Payer: Self-pay | Admitting: Family Medicine

## 2016-09-26 ENCOUNTER — Other Ambulatory Visit (HOSPITAL_BASED_OUTPATIENT_CLINIC_OR_DEPARTMENT_OTHER): Payer: Self-pay

## 2016-09-26 ENCOUNTER — Ambulatory Visit (HOSPITAL_BASED_OUTPATIENT_CLINIC_OR_DEPARTMENT_OTHER): Payer: Self-pay

## 2016-09-26 ENCOUNTER — Ambulatory Visit (HOSPITAL_COMMUNITY): Payer: Self-pay | Admitting: Psychiatry

## 2016-09-26 ENCOUNTER — Other Ambulatory Visit (INDEPENDENT_AMBULATORY_CARE_PROVIDER_SITE_OTHER): Payer: 59

## 2016-09-26 DIAGNOSIS — E039 Hypothyroidism, unspecified: Secondary | ICD-10-CM

## 2016-09-26 LAB — TSH: TSH: 0.55 u[IU]/mL (ref 0.35–4.50)

## 2016-10-03 ENCOUNTER — Ambulatory Visit (HOSPITAL_COMMUNITY): Payer: Self-pay | Admitting: Psychiatry

## 2016-10-08 MED FILL — LEVOTHYROXINE 112 MCG TAB: 112 | 90 days supply | Qty: 90 | Fill #1

## 2016-10-17 ENCOUNTER — Ambulatory Visit: Payer: Self-pay | Admitting: Family Medicine

## 2016-10-19 DIAGNOSIS — L0390 Cellulitis, unspecified: Secondary | ICD-10-CM | POA: Diagnosis not present

## 2016-10-24 ENCOUNTER — Encounter: Payer: Self-pay | Admitting: Family Medicine

## 2016-10-24 ENCOUNTER — Ambulatory Visit (INDEPENDENT_AMBULATORY_CARE_PROVIDER_SITE_OTHER): Payer: 59 | Admitting: Family Medicine

## 2016-10-24 DIAGNOSIS — E785 Hyperlipidemia, unspecified: Secondary | ICD-10-CM | POA: Diagnosis not present

## 2016-10-24 DIAGNOSIS — K219 Gastro-esophageal reflux disease without esophagitis: Secondary | ICD-10-CM

## 2016-10-24 DIAGNOSIS — E039 Hypothyroidism, unspecified: Secondary | ICD-10-CM

## 2016-10-24 DIAGNOSIS — N289 Disorder of kidney and ureter, unspecified: Secondary | ICD-10-CM | POA: Diagnosis not present

## 2016-10-24 DIAGNOSIS — F319 Bipolar disorder, unspecified: Secondary | ICD-10-CM | POA: Diagnosis not present

## 2016-10-24 DIAGNOSIS — R739 Hyperglycemia, unspecified: Secondary | ICD-10-CM | POA: Diagnosis not present

## 2016-10-24 NOTE — Assessment & Plan Note (Signed)
On Levothyroxine, continue to monitor 

## 2016-10-24 NOTE — Assessment & Plan Note (Signed)
Encouraged heart healthy diet, increase exercise, avoid trans fats, consider a krill oil cap daily 

## 2016-10-24 NOTE — Progress Notes (Signed)
Subjective:  I acted as a Education administrator for Dr. Charlett Blake. [place], [place]  Patient ID: Margaret [name], female    DOB: Dec 29, 1950, 66 y.o.   MRN: [id number]  No chief complaint on file.   HPI  Patient is in today for a follow up. She is following up on her hypothyroidism and other concerns. She is doing well. No recent change in medications. No recent febrile illness or hospitalization. She is trying to eat well and stay active. Denies CP/palp/SOB/HA/congestion/fevers/GI or GU c/o. Taking meds as prescribed. No polyria or polydipsia. .  Patient Care Team: Mosie Lukes, MD as PCP - General (Family Medicine)   Past Medical History:  Diagnosis Date  . Anxiety   . Arthritis 02/14/2013  . Bipolar disorder ([place])   . Esophageal reflux 09/19/2012   only slight with gallbladder problems  . Hypoglycemia   . Hypothyroidism   . Other malaise and fatigue 09/19/2012  . PONV (postoperative nausea and vomiting)   . Preventative health care 06/09/2016  . PTSD (post-traumatic stress disorder)   . Thyroid disease     Past Surgical History:  Procedure Laterality Date  . CHOLECYSTECTOMY N/A 09/01/2016   Procedure: LAPAROSCOPIC CHOLECYSTECTOMY WITH INTRAOPERATIVE CHOLANGIOGRAM;  Surgeon: Jovita Kussmaul, MD;  Location: [place];  Service: General;  Laterality: N/A;  . 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 fooet surgery Left 2014   reconstruction of foot and ankle to correct deformity  . NECK SURGERY    . OTHER SURGICAL HISTORY    . OTHER SURGICAL HISTORY    . OTHER SURGICAL HISTORY    . TUBAL LIGATION     reversal    Family History  Problem Relation Age of Onset  . Diabetes Mother        Brother 1 of 2  . Heart failure Mother   . Prostate cancer Father   . Parkinsonism Father        deceased  . Hypertension Brother   . Esophageal cancer Maternal Grandmother   . Cancer Paternal Grandmother        cancer of jawbone 1 of 2  . Melanoma Brother   . Breast cancer Neg Hx   . Colon cancer Neg Hx     Social History     Social History  . Marital status: Divorced    Spouse name: N/A  . Number of children: N/A  . Years of education: N/A   Occupational History  . Not on file.   Social History Main Topics  . Smoking status: Former Smoker    Packs/day: 3.00    Years: 24.00    Types: Cigarettes    Quit date: 1995  . Smokeless tobacco: Never Used  . Alcohol use No  . Drug use: No  . Sexual activity: Not on file   Other Topics Concern  . Not on file   Social History Narrative  . No narrative on file    Outpatient Medications Prior to Visit  Medication Sig Dispense Refill  . amitriptyline (ELAVIL) 50 MG tablet Take 1 tablet (50 mg total) by mouth at bedtime. 90 tablet 3  . carbamazepine (CARBATROL) 200 MG 12 hr capsule Take 1 capsule (200 mg total) by mouth 2 (two) times daily. 180 capsule 3  . Clobetasol Propionate 0.05 % lotion Apply 1 application topically 2 (two) times daily. (Patient taking differently: Apply 1 application topically daily as needed (eczema). ) 118 mL 1  . levothyroxine (SYNTHROID, LEVOTHROID) 112 MCG tablet  Take 1 tablet (112 mcg total) by mouth daily. (Patient taking differently: Take 112 mcg by mouth daily before breakfast. ) 90 tablet 1  . risperiDONE (RISPERDAL) 2 MG tablet Take 1 tablet (2 mg total) by mouth at bedtime. 90 tablet 3  . HYDROcodone-acetaminophen (NORCO/VICODIN) 5-325 MG tablet Take 1-2 tablets by mouth every 4 (four) hours as needed for moderate pain or severe pain. (Patient not taking: Reported on 10/24/2016) 15 tablet 0  . ibuprofen (ADVIL,MOTRIN) 200 MG tablet Take 400 mg by mouth 2 (two) times daily.    . traMADol (ULTRAM) 50 MG tablet Take 1-2 tablets (50-100 mg total) by mouth every 6 (six) hours as needed. 30 tablet 0   No facility-administered medications prior to visit.     Allergies  Allergen Reactions  . Novocain [Procaine] Other (See Comments)    Heart race  . Codeine Nausea Only  . Darvon Other (See Comments)    Hallucinations   . Sulfa  Drugs Cross Reactors Nausea And Vomiting    Review of Systems  Constitutional: Negative for fever and malaise/fatigue.  HENT: Negative for congestion.   Eyes: Negative for blurred vision.  Respiratory: Negative for cough and shortness of breath.   Cardiovascular: Negative for chest pain, palpitations and leg swelling.  Gastrointestinal: Negative for vomiting.  Musculoskeletal: Negative for back pain.  Skin: Negative for rash.  Neurological: Negative for loss of consciousness and headaches.       Objective:    Physical Exam  Constitutional: She is oriented to person, place, and time. She appears well-developed and well-nourished. No distress.  HENT:  Head: Normocephalic and atraumatic.  Eyes: Conjunctivae are normal.  Neck: Normal range of motion. No thyromegaly present.  Cardiovascular: Normal rate and regular rhythm.   Pulmonary/Chest: Effort normal and breath sounds normal. She has no wheezes.  Abdominal: Soft. Bowel sounds are normal. There is no tenderness.  Musculoskeletal: Normal range of motion. She exhibits no edema or deformity.  Neurological: She is alert and oriented to person, place, and time.  Skin: Skin is warm and dry. She is not diaphoretic.  Psychiatric: She has a normal mood and affect.    There were no vitals taken for this visit. Wt Readings from Last 3 Encounters:  08/26/16 184 lb 6.4 oz (83.6 kg)  07/04/16 183 lb 4.8 oz (83.1 kg)  06/09/16 185 lb 6.4 oz (84.1 kg)   BP Readings from Last 3 Encounters:  09/01/16 133/65  08/26/16 (!) 141/75  08/09/16 (!) 152/73     Immunization History  Administered Date(s) Administered  . Influenza Split 11/11/2011  . Influenza-Unspecified 12/13/2012  . PPD Test 11/11/2011  . Td 02/11/2008    Health Maintenance  Topic Date Due  . DEXA SCAN  09/16/2015  . PNA vac Low Risk Adult (1 of 2 - PCV13) 09/16/2015  . MAMMOGRAM  08/27/2016  . INFLUENZA VACCINE  09/10/2016  . TETANUS/TDAP  02/10/2018  . COLONOSCOPY   07/20/2021  . Hepatitis C Screening  Completed    Lab Results  Component Value Date   WBC 6.8 08/26/2016   HGB 13.7 08/26/2016   HCT 40.7 08/26/2016   PLT 244 08/26/2016   GLUCOSE 114 (H) 08/26/2016   CHOL 221 (H) 03/17/2016   TRIG 180.0 (H) 03/17/2016   HDL 45.00 03/17/2016   LDLDIRECT 162.0 10/30/2014   LDLCALC 140 (H) 03/17/2016   ALT 18 08/09/2016   AST 18 08/09/2016   NA 141 08/26/2016   K 4.5 08/26/2016   CL 110  08/26/2016   CREATININE 0.88 08/26/2016   BUN 29 (H) 08/26/2016   CO2 27 08/26/2016   TSH 0.55 09/26/2016   HGBA1C 5.5 03/17/2016    Lab Results  Component Value Date   TSH 0.55 09/26/2016   Lab Results  Component Value Date   WBC 6.8 08/26/2016   HGB 13.7 08/26/2016   HCT 40.7 08/26/2016   MCV 96.0 08/26/2016   PLT 244 08/26/2016   Lab Results  Component Value Date   NA 141 08/26/2016   K 4.5 08/26/2016   CO2 27 08/26/2016   GLUCOSE 114 (H) 08/26/2016   BUN 29 (H) 08/26/2016   CREATININE 0.88 08/26/2016   BILITOT 0.5 08/09/2016   ALKPHOS 87 08/09/2016   AST 18 08/09/2016   ALT 18 08/09/2016   PROT 6.5 08/09/2016   ALBUMIN 4.2 08/09/2016   CALCIUM 9.5 08/26/2016   ANIONGAP 4 (L) 08/26/2016   GFR 61.90 03/17/2016   Lab Results  Component Value Date   CHOL 221 (H) 03/17/2016   Lab Results  Component Value Date   HDL 45.00 03/17/2016   Lab Results  Component Value Date   LDLCALC 140 (H) 03/17/2016   Lab Results  Component Value Date   TRIG 180.0 (H) 03/17/2016   Lab Results  Component Value Date   CHOLHDL 5 03/17/2016   Lab Results  Component Value Date   HGBA1C 5.5 03/17/2016         Assessment & Plan:   Problem List Items Addressed This Visit    None      I have discontinued Ms. Chawla's ibuprofen, HYDROcodone-acetaminophen, and traMADol. I am also having her maintain her Clobetasol Propionate, risperiDONE, carbamazepine, amitriptyline, and levothyroxine.  No orders of the defined types were placed in this  encounter.   CMA served as Education administrator during this visit. History, Physical and Plan performed by medical provider. Documentation and orders reviewed and attested to.  Magdalene Molly, [place]

## 2016-10-24 NOTE — Assessment & Plan Note (Signed)
Avoid offending foods, start probiotics. Do not eat large meals in late evening and consider raising head of bed.  

## 2016-10-24 NOTE — Assessment & Plan Note (Signed)
hgba1c acceptable, minimize simple carbs. Increase exercise as tolerated.  

## 2016-10-24 NOTE — Assessment & Plan Note (Signed)
Hydrates well, check cmp with next blood

## 2016-10-24 NOTE — Patient Instructions (Signed)

## 2016-10-26 NOTE — Assessment & Plan Note (Signed)
Stable doing well on current meds

## 2016-12-05 ENCOUNTER — Ambulatory Visit: Payer: Self-pay | Admitting: Family Medicine

## 2016-12-10 MED FILL — CARBAMAZEPINE ER 200 MG CAP: 200 | 90 days supply | Qty: 180 | Fill #2

## 2016-12-10 MED FILL — risperiDONE 2 MG TABS: 2 | 90 days supply | Qty: 90 | Fill #2

## 2016-12-10 MED FILL — AMITRIPTYLINE HCL 50 MG TAB: 50 | 90 days supply | Qty: 90 | Fill #2

## 2017-01-05 MED FILL — LEVOTHYROXINE 112 MCG TAB: 112 | 30 days supply | Qty: 30 | Fill #1

## 2017-01-07 ENCOUNTER — Encounter: Payer: Self-pay | Admitting: Medical

## 2017-01-07 ENCOUNTER — Ambulatory Visit (HOSPITAL_BASED_OUTPATIENT_CLINIC_OR_DEPARTMENT_OTHER)
Admission: RE | Admit: 2017-01-07 | Discharge: 2017-01-07 | Disposition: A | Discharge status: Home / Self Care | Payer: 59 | Source: Ambulatory Visit | Attending: Medical | Admitting: Medical

## 2017-01-07 ENCOUNTER — Ambulatory Visit (INDEPENDENT_AMBULATORY_CARE_PROVIDER_SITE_OTHER): Payer: 59 | Admitting: Medical

## 2017-01-07 VITALS — BP 156/83 | HR 81 | Temp 98.0°F | Resp 16 | Ht 67.0 in | Wt 194.6 lb

## 2017-01-07 DIAGNOSIS — Z8781 Personal history of (healed) traumatic fracture: Secondary | ICD-10-CM | POA: Diagnosis not present

## 2017-01-07 DIAGNOSIS — M25572 Pain in left ankle and joints of left foot: Secondary | ICD-10-CM | POA: Insufficient documentation

## 2017-01-07 DIAGNOSIS — S99912A Unspecified injury of left ankle, initial encounter: Secondary | ICD-10-CM | POA: Diagnosis not present

## 2017-01-07 NOTE — Patient Instructions (Addendum)
For your recent ankle injury, we need to get x-ray of the area stat.  For pain and inflammation you can use your ibuprofen/Tylenol combination as this seems to have helped a lot.  Crutches are probably going to be beneficial to keep pressure off of the area  If no fracture seen then would recommend rest, ice, compression(with Ace bandage) and elevation.  Even if no fracture is seen but pain still persisting then might need to refer you to a specialist to evaluate ligament in your ankle.  If fracture is seen then would go ahead and refer you to specialist.  Follow-up 7-10 days or as needed.

## 2017-01-07 NOTE — Progress Notes (Signed)
Subjective:    Patient ID: Margaret [name], female    DOB: February 24, 1950, 66 y.o.   MRN: [id number]  HPI  Pt in for recent left ankle pain. She was wearing high heals and rolled her ankle. This happened last night. Pt states pain was moderate but she took 600 mg ibuprofen and low dose tylenol and this helped.   Pt states without medication hurts a lot to place pressure on ankle.   Pt has seated job.    Review of Systems  Constitutional: Negative for chills, fatigue and fever.  Respiratory: Negative for choking, shortness of breath and wheezing.   Cardiovascular: Negative for chest pain and palpitations.  Musculoskeletal: Negative for back pain and myalgias.       Left ankle pain.  Skin: Negative for rash.  Neurological: Negative for dizziness, weakness and headaches.  Hematological: Negative for adenopathy. Does not bruise/bleed easily.    Past Medical History:  Diagnosis Date  . Anxiety   . Arthritis 02/14/2013  . Bipolar disorder ([place])   . Esophageal reflux 09/19/2012   only slight with gallbladder problems  . Hypoglycemia   . Hypothyroidism   . Other malaise and fatigue 09/19/2012  . PONV (postoperative nausea and vomiting)   . Preventative health care 06/09/2016  . PTSD (post-traumatic stress disorder)   . Thyroid disease      Social History   Socioeconomic History  . Marital status: Divorced    Spouse name: Not on file  . Number of children: Not on file  . Years of education: Not on file  . Highest education level: Not on file  Social Needs  . Financial resource strain: Not on file  . Food insecurity - worry: Not on file  . Food insecurity - inability: Not on file  . Transportation needs - medical: Not on file  . Transportation needs - non-medical: Not on file  Occupational History  . Not on file  Tobacco Use  . Smoking status: Former Smoker    Packs/day: 3.00    Years: 24.00    Pack years: 72.00    Types: Cigarettes    Last attempt to quit: 1995    Years  since quitting: 23.9  . Smokeless tobacco: Never Used  Substance and Sexual Activity  . Alcohol use: No    Alcohol/week: 0.0 oz  . Drug use: No  . Sexual activity: Not on file  Other Topics Concern  . Not on file  Social History Narrative  . Not on file    Past Surgical History:  Procedure Laterality Date  . CHOLECYSTECTOMY N/A 09/01/2016   Procedure: LAPAROSCOPIC CHOLECYSTECTOMY WITH INTRAOPERATIVE CHOLANGIOGRAM;  Surgeon: Jovita Kussmaul, MD;  Location: [facility];  Service: General;  Laterality: N/A;  . 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 fooet surgery Left 2014   reconstruction of foot and ankle to correct deformity  . NECK SURGERY    . OTHER SURGICAL HISTORY    . OTHER SURGICAL HISTORY    . OTHER SURGICAL HISTORY    . TUBAL LIGATION     reversal    Family History  Problem Relation Age of Onset  . Diabetes Mother        Brother 1 of 2  . Heart failure Mother   . Prostate cancer Father   . Parkinsonism Father        deceased  . Hypertension Brother   . Esophageal cancer Maternal Grandmother   . Cancer Paternal Grandmother  cancer of jawbone 1 of 2  . Melanoma Brother   . Breast cancer Neg Hx   . Colon cancer Neg Hx     Allergies  Allergen Reactions  . Novocain [Procaine] Other (See Comments)    Heart race  . Codeine Nausea Only  . Darvon Other (See Comments)    Hallucinations   . Sulfa Drugs Cross Reactors Nausea And Vomiting    Current Outpatient Medications on File Prior to Visit  Medication Sig Dispense Refill  . amitriptyline (ELAVIL) 50 MG tablet Take 1 tablet (50 mg total) by mouth at bedtime. 90 tablet 3  . carbamazepine (CARBATROL) 200 MG 12 hr capsule Take 1 capsule (200 mg total) by mouth 2 (two) times daily. 180 capsule 3  . Clobetasol Propionate 0.05 % lotion Apply 1 application topically 2 (two) times daily. (Patient taking differently: Apply 1 application topically daily as needed (eczema). ) 118 mL 1  . levothyroxine (SYNTHROID, LEVOTHROID) 112 MCG  tablet Take 1 tablet (112 mcg total) by mouth daily. (Patient taking differently: Take 112 mcg by mouth daily before breakfast. ) 90 tablet 1  . risperiDONE (RISPERDAL) 2 MG tablet Take 1 tablet (2 mg total) by mouth at bedtime. 90 tablet 3   No current facility-administered medications on file prior to visit.     BP (!) 156/83 (BP Location: Left Arm, Patient Position: Sitting, Cuff Size: Small)   Pulse 81   Temp 98 F (36.7 C) (Oral)   Resp 16   Ht 5\' 7"  (1.702 m)   Wt 194 lb 9.6 oz (88.3 kg)   SpO2 100%   BMI 30.48 kg/m      Objective:   Physical Exam   General- No acute distress. Pleasant patient.  Lungs- Clear, even and unlabored. Heart- regular rate and rhythm. Neurologic- CNII- XII grossly intact.  Left ankle- lateral aspect tender. Talofibular tenderness to palpation.  Left foot- no pain on palpation. No pain on palpation proximal 5th metatarsal. Left tibia and fibula- no tender.    Assessment & Plan:  For your recent ankle injury, we need to get x-ray of the area stat.  For pain and inflammation you can use your ibuprofen/Tylenol combination as this seems to have helped a lot.  Crutches are probably going to be beneficial to keep pressure off of the area  If no fracture seen then would recommend rest, ice, compression(with Ace bandage) and elevation.  Even if no fracture is seen but pain still persisting then might need to refer you to a specialist to evaluate ligament in your ankle.  If fracture is seen then would go ahead and refer you to specialist.  Follow-up 7-10 days or as needed.  Shrihaan Porzio, Percell Miller, PA-C

## 2017-01-09 ENCOUNTER — Other Ambulatory Visit: Payer: Self-pay

## 2017-01-12 ENCOUNTER — Ambulatory Visit: Payer: Self-pay | Admitting: Family Medicine

## 2017-02-04 MED FILL — LEVOTHYROXINE 112 MCG TAB: 112 | 30 days supply | Qty: 30 | Fill #2

## 2017-03-09 MED FILL — LEVOTHYROXINE 112 MCG TAB: 112 | 30 days supply | Qty: 30 | Fill #3

## 2017-03-09 MED FILL — risperiDONE 2 MG TABS: 2 | 90 days supply | Qty: 90 | Fill #3

## 2017-03-09 MED FILL — AMITRIPTYLINE HCL 50 MG TAB: 50 | 90 days supply | Qty: 90 | Fill #3

## 2017-03-09 MED FILL — CARBAMAZEPINE ER 200 MG CAP: 200 | 90 days supply | Qty: 180 | Fill #3

## 2017-03-16 ENCOUNTER — Ambulatory Visit (INDEPENDENT_AMBULATORY_CARE_PROVIDER_SITE_OTHER): Payer: No Typology Code available for payment source | Admitting: Family Medicine

## 2017-03-16 DIAGNOSIS — R739 Hyperglycemia, unspecified: Secondary | ICD-10-CM | POA: Diagnosis not present

## 2017-03-16 DIAGNOSIS — E785 Hyperlipidemia, unspecified: Secondary | ICD-10-CM

## 2017-03-16 DIAGNOSIS — J069 Acute upper respiratory infection, unspecified: Secondary | ICD-10-CM | POA: Diagnosis not present

## 2017-03-16 DIAGNOSIS — K219 Gastro-esophageal reflux disease without esophagitis: Secondary | ICD-10-CM | POA: Diagnosis not present

## 2017-03-16 DIAGNOSIS — E039 Hypothyroidism, unspecified: Secondary | ICD-10-CM

## 2017-03-16 DIAGNOSIS — N289 Disorder of kidney and ureter, unspecified: Secondary | ICD-10-CM

## 2017-03-16 LAB — COMPREHENSIVE METABOLIC PANEL
ALBUMIN: 4.1 g/dL (ref 3.5–5.2)
ALK PHOS: 87 U/L (ref 39–117)
ALT: 13 U/L (ref 0–35)
AST: 12 U/L (ref 0–37)
BUN: 26 mg/dL — ABNORMAL HIGH (ref 6–23)
CO2: 28 mEq/L (ref 19–32)
CREATININE: 0.93 mg/dL (ref 0.40–1.20)
Calcium: 9.3 mg/dL (ref 8.4–10.5)
Chloride: 106 mEq/L (ref 96–112)
GFR: 64.01 mL/min (ref >60.00)
GLUCOSE: 96 mg/dL (ref 70–99)
Potassium: 4.6 mEq/L (ref 3.5–5.1)
Sodium: 140 mEq/L (ref 135–145)
TOTAL PROTEIN: 6.5 g/dL (ref 6.0–8.3)
Total Bilirubin: 0.3 mg/dL (ref 0.2–1.2)

## 2017-03-16 LAB — CBC
HCT: 43.1 % (ref 36.0–46.0)
Hemoglobin: 14.8 g/dL (ref 12.0–15.0)
MCHC: 34.3 g/dL (ref 30.0–36.0)
MCV: 96.9 fl (ref 78.0–100.0)
Platelets: 205 10*3/uL (ref 150.0–400.0)
RBC: 4.44 Mil/uL (ref 3.87–5.11)
RDW: 13.1 % (ref 11.5–15.5)
WBC: 5.2 10*3/uL (ref 4.0–10.5)

## 2017-03-16 LAB — LIPID PANEL
CHOLESTEROL: 219 mg/dL — AB (ref 0–200)
HDL: 63.7 mg/dL (ref >39.00)
LDL Cholesterol: 118 mg/dL — ABNORMAL HIGH (ref 0–99)
NonHDL: 155.4
Total CHOL/HDL Ratio: 3
Triglycerides: 185 mg/dL — ABNORMAL HIGH (ref 0.0–149.0)
VLDL: 37 mg/dL (ref 0.0–40.0)

## 2017-03-16 LAB — TSH: TSH: 1.66 u[IU]/mL (ref 0.35–4.50)

## 2017-03-16 LAB — HEMOGLOBIN A1C: HEMOGLOBIN A1C: 5.7 % (ref 4.6–6.5)

## 2017-03-16 MED ORDER — LEVOTHYROXINE SODIUM 112 MCG PO TABS: 112.0000 ug | ORAL_TABLET | Freq: Every day | ORAL | 2 refills | Status: DC

## 2017-03-16 NOTE — Progress Notes (Signed)
Subjective:  I acted as a Education administrator for Dr. Charlett Blake. [place], [place]   Patient ID: Margaret [name], female    DOB: October 11, 1950, 67 y.o.   MRN: [id number]  No chief complaint on file.   HPI  Patient is in today for a evaluation of respiratory symptoms. She initially made the appointment for the evaluation of episodes she thought were low blood sugars although she did not check them. She went on a low fat diet and she was having shakey anxious episodes so she stopped the diet and these resolved. Then yesterday she picked up a virus and is noting head congestion, cough, malaise and myalgias. No fevers, chills, no sore throat or ear pain. Denies CP/palp/SOB/HA/fevers/GI or GU c/o. Taking meds as prescribed  Patient Care Team: Mosie Lukes, MD as PCP - General (Family Medicine)   Past Medical History:  Diagnosis Date  . Anxiety   . Arthritis 02/14/2013  . Bipolar disorder ([place])   . Esophageal reflux 09/19/2012   only slight with gallbladder problems  . Hypoglycemia   . Hypothyroidism   . Other malaise and fatigue 09/19/2012  . PONV (postoperative nausea and vomiting)   . Preventative health care 06/09/2016  . PTSD (post-traumatic stress disorder)   . Thyroid disease     Past Surgical History:  Procedure Laterality Date  . CHOLECYSTECTOMY N/A 09/01/2016   Procedure: LAPAROSCOPIC CHOLECYSTECTOMY WITH INTRAOPERATIVE CHOLANGIOGRAM;  Surgeon: Jovita Kussmaul, MD;  Location: [facility];  Service: General;  Laterality: N/A;  . 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 fooet surgery Left 2014   reconstruction of foot and ankle to correct deformity  . NECK SURGERY    . OTHER SURGICAL HISTORY    . OTHER SURGICAL HISTORY    . OTHER SURGICAL HISTORY    . TUBAL LIGATION     reversal    Family History  Problem Relation Age of Onset  . Diabetes Mother        Brother 1 of 2  . Heart failure Mother   . Prostate cancer Father   . Parkinsonism Father        deceased  . Hypertension Brother   . Esophageal cancer  Maternal Grandmother   . Cancer Paternal Grandmother        cancer of jawbone 1 of 2  . Melanoma Brother   . Breast cancer Neg Hx   . Colon cancer Neg Hx     Social History   Socioeconomic History  . Marital status: Divorced    Spouse name: Not on file  . Number of children: Not on file  . Years of education: Not on file  . Highest education level: Not on file  Social Needs  . Financial resource strain: Not on file  . Food insecurity - worry: Not on file  . Food insecurity - inability: Not on file  . Transportation needs - medical: Not on file  . Transportation needs - non-medical: Not on file  Occupational History  . Not on file  Tobacco Use  . Smoking status: Former Smoker    Packs/day: 3.00    Years: 24.00    Pack years: 72.00    Types: Cigarettes    Last attempt to quit: 1995    Years since quitting: 24.1  . Smokeless tobacco: Never Used  Substance and Sexual Activity  . Alcohol use: No    Alcohol/week: 0.0 oz  . Drug use: No  . Sexual activity: Not on file  Other Topics Concern  . Not on file  Social History Narrative  . Not on file    Outpatient Medications Prior to Visit  Medication Sig Dispense Refill  . amitriptyline (ELAVIL) 50 MG tablet Take 1 tablet (50 mg total) by mouth at bedtime. 90 tablet 3  . carbamazepine (CARBATROL) 200 MG 12 hr capsule Take 1 capsule (200 mg total) by mouth 2 (two) times daily. 180 capsule 3  . Clobetasol Propionate 0.05 % lotion Apply 1 application topically 2 (two) times daily. (Patient taking differently: Apply 1 application topically daily as needed (eczema). ) 118 mL 1  . risperiDONE (RISPERDAL) 2 MG tablet Take 1 tablet (2 mg total) by mouth at bedtime. 90 tablet 3  . levothyroxine (SYNTHROID, LEVOTHROID) 112 MCG tablet Take 1 tablet (112 mcg total) by mouth daily. (Patient taking differently: Take 112 mcg by mouth daily before breakfast. ) 90 tablet 1   No facility-administered medications prior to visit.      Allergies  Allergen Reactions  . Novocain [Procaine] Other (See Comments)    Heart race  . Codeine Nausea Only  . Darvon Other (See Comments)    Hallucinations   . Sulfa Drugs Cross Reactors Nausea And Vomiting    Review of Systems  Constitutional: Positive for malaise/fatigue. Negative for fever.  HENT: Positive for congestion.   Eyes: Negative for blurred vision.  Respiratory: Positive for cough. Negative for shortness of breath.   Cardiovascular: Negative for chest pain, palpitations and leg swelling.  Gastrointestinal: Negative for abdominal pain, blood in stool and nausea.  Genitourinary: Negative for dysuria and frequency.  Musculoskeletal: Positive for myalgias. Negative for falls.  Skin: Negative for rash.  Neurological: Negative for dizziness, loss of consciousness and headaches.  Endo/Heme/Allergies: Negative for environmental allergies.  Psychiatric/Behavioral: Negative for depression. The patient is not nervous/anxious.        Objective:    Physical Exam  Constitutional: She is oriented to person, place, and time. She appears well-developed and well-nourished. No distress.  HENT:  Head: Normocephalic and atraumatic.  Nose: Nose normal.  Eyes: Right eye exhibits no discharge. Left eye exhibits no discharge.  Neck: Normal range of motion. Neck supple.  Cardiovascular: Normal rate and regular rhythm.  No murmur heard. Pulmonary/Chest: Effort normal and breath sounds normal.  Abdominal: Soft. Bowel sounds are normal. There is no tenderness.  Musculoskeletal: She exhibits no edema.  Neurological: She is alert and oriented to person, place, and time.  Skin: Skin is warm and dry.  Psychiatric: She has a normal mood and affect.  Nursing note and vitals reviewed.   BP 124/86 (BP Location: Left Arm, Patient Position: Sitting, Cuff Size: Normal)   Pulse 79   Temp 97.7 F (36.5 C) (Oral)   Resp 18   Wt 198 lb 12.8 oz (90.2 kg)   SpO2 98%   BMI 31.14 kg/m   Wt Readings from Last 3 Encounters:  03/16/17 198 lb 12.8 oz (90.2 kg)  01/07/17 194 lb 9.6 oz (88.3 kg)  10/24/16 183 lb 12.8 oz (83.4 kg)   BP Readings from Last 3 Encounters:  03/16/17 124/86  01/07/17 (!) 156/83  10/24/16 116/70     Immunization History  Administered Date(s) Administered  . Influenza Split 11/11/2011  . Influenza-Unspecified 12/13/2012  . PPD Test 11/11/2011  . Td 02/11/2008    Health Maintenance  Topic Date Due  . DEXA SCAN  09/16/2015  . PNA vac Low Risk Adult (1 of 2 - PCV13) 09/16/2015  .  MAMMOGRAM  08/27/2016  . TETANUS/TDAP  02/10/2018  . COLONOSCOPY  07/20/2021  . INFLUENZA VACCINE  Completed  . Hepatitis C Screening  Completed    Lab Results  Component Value Date   WBC 5.2 03/16/2017   HGB 14.8 03/16/2017   HCT 43.1 03/16/2017   PLT 205.0 03/16/2017   GLUCOSE 96 03/16/2017   CHOL 219 (H) 03/16/2017   TRIG 185.0 (H) 03/16/2017   HDL 63.70 03/16/2017   LDLDIRECT 162.0 10/30/2014   LDLCALC 118 (H) 03/16/2017   ALT 13 03/16/2017   AST 12 03/16/2017   NA 140 03/16/2017   K 4.6 03/16/2017   CL 106 03/16/2017   CREATININE 0.93 03/16/2017   BUN 26 (H) 03/16/2017   CO2 28 03/16/2017   TSH 1.66 03/16/2017   HGBA1C 5.7 03/16/2017    Lab Results  Component Value Date   TSH 1.66 03/16/2017   Lab Results  Component Value Date   WBC 5.2 03/16/2017   HGB 14.8 03/16/2017   HCT 43.1 03/16/2017   MCV 96.9 03/16/2017   PLT 205.0 03/16/2017   Lab Results  Component Value Date   NA 140 03/16/2017   K 4.6 03/16/2017   CO2 28 03/16/2017   GLUCOSE 96 03/16/2017   BUN 26 (H) 03/16/2017   CREATININE 0.93 03/16/2017   BILITOT 0.3 03/16/2017   ALKPHOS 87 03/16/2017   AST 12 03/16/2017   ALT 13 03/16/2017   PROT 6.5 03/16/2017   ALBUMIN 4.1 03/16/2017   CALCIUM 9.3 03/16/2017   ANIONGAP 4 (L) 08/26/2016   GFR 64.01 03/16/2017   Lab Results  Component Value Date   CHOL 219 (H) 03/16/2017   Lab Results  Component Value Date   HDL  63.70 03/16/2017   Lab Results  Component Value Date   LDLCALC 118 (H) 03/16/2017   Lab Results  Component Value Date   TRIG 185.0 (H) 03/16/2017   Lab Results  Component Value Date   CHOLHDL 3 03/16/2017   Lab Results  Component Value Date   HGBA1C 5.7 03/16/2017         Assessment & Plan:   Problem List Items Addressed This Visit    Hypothyroidism    On Levothyroxine, continue to monitor      Relevant Medications   levothyroxine (SYNTHROID, LEVOTHROID) 112 MCG tablet   Hyperlipidemia    Encouraged heart healthy diet, increase exercise, avoid trans fats, consider a krill oil cap daily      Hyperglycemia    Was having some low blood sugars  But she was on low fat diet. Once she stopped she felt better. She felt shakey and anxious when she ate felt better. Will check hgba1c today      Esophageal reflux   Renal insufficiency   Upper respiratory infection    Encouraged increased rest and hydration, add probiotics, zinc such as Coldeze or Xicam. Treat fevers as needed. Influenza test is negative       Other Visit Diagnoses    Borderline hyperlipidemia          I have changed Opal Sidles D. Speas's levothyroxine. I am also having her maintain her Clobetasol Propionate, risperiDONE, carbamazepine, and amitriptyline.  Meds ordered this encounter  Medications  . levothyroxine (SYNTHROID, LEVOTHROID) 112 MCG tablet    Sig: Take 1 tablet (112 mcg total) by mouth daily before breakfast.    Dispense:  90 tablet    Refill:  2    CMA served as scribe during this visit. History,  Physical and Plan performed by medical provider. Documentation and orders reviewed and attested to.  Penni Homans, MD

## 2017-03-16 NOTE — Assessment & Plan Note (Signed)
Was having some low blood sugars  But she was on low fat diet. Once she stopped she felt better. She felt shakey and anxious when she ate felt better. Will check hgba1c today

## 2017-03-16 NOTE — Assessment & Plan Note (Signed)
On Levothyroxine, continue to monitor 

## 2017-03-16 NOTE — Assessment & Plan Note (Addendum)
Encouraged increased rest and hydration, add probiotics, zinc such as Coldeze or Xicam. Treat fevers as needed. Influenza test is negative

## 2017-03-16 NOTE — Assessment & Plan Note (Signed)
Encouraged heart healthy diet, increase exercise, avoid trans fats, consider a krill oil cap daily 

## 2017-03-16 NOTE — Patient Instructions (Signed)
Encouraged increased rest and hydration, add probiotics, zinc such as Coldeze or Xicam. Treat fevers as needed, vitamin C 500 to 1000 mg daily and elderberry  Upper Respiratory Infection, Adult Most upper respiratory infections (URIs) are caused by a virus. A URI affects the nose, throat, and upper air passages. The most common type of URI is often called "the common cold." Follow these instructions at home:  Take medicines only as told by your doctor.  Gargle warm saltwater or take cough drops to comfort your throat as told by your doctor.  Use a warm mist humidifier or inhale steam from a shower to increase air moisture. This may make it easier to breathe.  Drink enough fluid to keep your pee (urine) clear or pale yellow.  Eat soups and other clear broths.  Have a healthy diet.  Rest as needed.  Go back to work when your fever is gone or your doctor says it is okay. ? You may need to stay home longer to avoid giving your URI to others. ? You can also wear a face mask and wash your hands often to prevent spread of the virus.  Use your inhaler more if you have asthma.  Do not use any tobacco products, including cigarettes, chewing tobacco, or electronic cigarettes. If you need help quitting, ask your doctor. Contact a doctor if:  You are getting worse, not better.  Your symptoms are not helped by medicine.  You have chills.  You are getting more short of breath.  You have brown or red mucus.  You have yellow or brown discharge from your nose.  You have pain in your face, especially when you bend forward.  You have a fever.  You have puffy (swollen) neck glands.  You have pain while swallowing.  You have white areas in the back of your throat. Get help right away if:  You have very bad or constant: ? Headache. ? Ear pain. ? Pain in your forehead, behind your eyes, and over your cheekbones (sinus pain). ? Chest pain.  You have long-lasting (chronic) lung disease  and any of the following: ? Wheezing. ? Long-lasting cough. ? Coughing up blood. ? A change in your usual mucus.  You have a stiff neck.  You have changes in your: ? Vision. ? Hearing. ? Thinking. ? Mood. This information is not intended to replace advice given to you by your health care provider. Make sure you discuss any questions you have with your health care provider. Document Released: 07/16/2007 Document Revised: 09/30/2015 Document Reviewed: 05/04/2013 Elsevier Interactive Patient Education  2018 [employer].

## 2017-03-23 ENCOUNTER — Ambulatory Visit (INDEPENDENT_AMBULATORY_CARE_PROVIDER_SITE_OTHER): Payer: No Typology Code available for payment source | Admitting: Medical

## 2017-03-23 ENCOUNTER — Encounter: Payer: Self-pay | Admitting: Medical

## 2017-03-23 VITALS — BP 130/80 | HR 76 | Temp 98.0°F | Resp 16 | Ht 67.0 in | Wt 198.4 lb

## 2017-03-23 DIAGNOSIS — H6121 Impacted cerumen, right ear: Secondary | ICD-10-CM | POA: Diagnosis not present

## 2017-03-23 MED ORDER — NEOMYCIN-POLYMYXIN-HC 3.5-10000-1 OT SOLN: 3.0000 [drp] | Freq: Four times a day (QID) | OTIC | 0 refills | Status: DC

## 2017-03-23 NOTE — Patient Instructions (Signed)
Your rt ear wax was removed successfully. Post lavage some redness to the TM likely traumatic. Will rx cortisporin otic drops. If any pain even with these drops let us know.  Follow up as regularly scheduled with pcp or as needed

## 2017-03-23 NOTE — Progress Notes (Signed)
Subjective:    Patient ID: Margaret [name], female    DOB: Apr 19, 1950, 67 y.o.   MRN: [id number]  HPI  Pt in with rt ear feeling plugged. She states came on when got water in her rt ear last night. Tried to clean out the wax and felt some pain. No upper respiratory or sinus infection symptoms associated/preceded.    Review of Systems  Constitutional: Negative for chills and fatigue.  HENT: Negative for congestion, ear pain and mouth sores.        See HPI  Respiratory: Negative for cough, chest tightness, shortness of breath and wheezing.   Cardiovascular: Negative for chest pain and palpitations.  Gastrointestinal: Negative for abdominal distention, abdominal pain, blood in stool, constipation, diarrhea and vomiting.  Musculoskeletal: Negative for back pain.  Neurological: Negative for dizziness, seizures, light-headedness and headaches.  Hematological: Negative for adenopathy. Does not bruise/bleed easily.  Psychiatric/Behavioral: Negative for behavioral problems and confusion. The patient is not nervous/anxious.    Past Medical History:  Diagnosis Date  . Anxiety   . Arthritis 02/14/2013  . Bipolar disorder ([place])   . Esophageal reflux 09/19/2012   only slight with gallbladder problems  . Hypoglycemia   . Hypothyroidism   . Other malaise and fatigue 09/19/2012  . PONV (postoperative nausea and vomiting)   . Preventative health care 06/09/2016  . PTSD (post-traumatic stress disorder)   . Thyroid disease      Social History   Socioeconomic History  . Marital status: Divorced    Spouse name: Not on file  . Number of children: Not on file  . Years of education: Not on file  . Highest education level: Not on file  Social Needs  . Financial resource strain: Not on file  . Food insecurity - worry: Not on file  . Food insecurity - inability: Not on file  . Transportation needs - medical: Not on file  . Transportation needs - non-medical: Not on file  Occupational History  . Not  on file  Tobacco Use  . Smoking status: Former Smoker    Packs/day: 3.00    Years: 24.00    Pack years: 72.00    Types: Cigarettes    Last attempt to quit: 1995    Years since quitting: 24.1  . Smokeless tobacco: Never Used  Substance and Sexual Activity  . Alcohol use: No    Alcohol/week: 0.0 oz  . Drug use: No  . Sexual activity: Not on file  Other Topics Concern  . Not on file  Social History Narrative  . Not on file    Past Surgical History:  Procedure Laterality Date  . CHOLECYSTECTOMY N/A 09/01/2016   Procedure: LAPAROSCOPIC CHOLECYSTECTOMY WITH INTRAOPERATIVE CHOLANGIOGRAM;  Surgeon: Jovita Kussmaul, MD;  Location: [facility];  Service: General;  Laterality: N/A;  . 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 fooet surgery Left 2014   reconstruction of foot and ankle to correct deformity  . NECK SURGERY    . OTHER SURGICAL HISTORY    . OTHER SURGICAL HISTORY    . OTHER SURGICAL HISTORY    . TUBAL LIGATION     reversal    Family History  Problem Relation Age of Onset  . Diabetes Mother        Brother 1 of 2  . Heart failure Mother   . Prostate cancer Father   . Parkinsonism Father        deceased  . Hypertension Brother   .  Esophageal cancer Maternal Grandmother   . Cancer Paternal Grandmother        cancer of jawbone 1 of 2  . Melanoma Brother   . Breast cancer Neg Hx   . Colon cancer Neg Hx     Allergies  Allergen Reactions  . Novocain [Procaine] Other (See Comments)    Heart race  . Codeine Nausea Only  . Darvon Other (See Comments)    Hallucinations   . Sulfa Drugs Cross Reactors Nausea And Vomiting    Current Outpatient Medications on File Prior to Visit  Medication Sig Dispense Refill  . amitriptyline (ELAVIL) 50 MG tablet Take 1 tablet (50 mg total) by mouth at bedtime. 90 tablet 3  . carbamazepine (CARBATROL) 200 MG 12 hr capsule Take 1 capsule (200 mg total) by mouth 2 (two) times daily. 180 capsule 3  . Clobetasol Propionate 0.05 % lotion Apply 1  application topically 2 (two) times daily. (Patient taking differently: Apply 1 application topically daily as needed (eczema). ) 118 mL 1  . levothyroxine (SYNTHROID, LEVOTHROID) 112 MCG tablet Take 1 tablet (112 mcg total) by mouth daily before breakfast. 90 tablet 2  . risperiDONE (RISPERDAL) 2 MG tablet Take 1 tablet (2 mg total) by mouth at bedtime. 90 tablet 3   No current facility-administered medications on file prior to visit.     BP 130/80   Pulse 76   Temp 98 F (36.7 C) (Oral)   Resp 16   Ht 5\' 7"  (1.702 m)   Wt 198 lb 6.4 oz (90 kg)   SpO2 97%   BMI 31.07 kg/m      Objective:   Physical Exam   General  Mental Status - Alert. General Appearance - Well groomed. Not in acute distress.  Skin Rashes- No Rashes.  HEENT Head- Normal. Ear Auditory Canal - Left- Normal. Right - Normal.Tympanic Membrane- Left- Normal. Right-large amount of wax present on the right side.(post lavage tm intact but mild red)Eye Sclera/Conjunctiva- Left- Normal. Right- Normal. Nose & Sinuses Nasal Mucosa- Left-  Boggy and Congested. Right-  Boggy and  Congested.Bilateral maxillary and frontal sinus pressure. Mouth & Throat Lips: Upper Lip- Normal: no dryness, cracking, pallor, cyanosis, or vesicular eruption. Lower Lip-Normal: no dryness, cracking, pallor, cyanosis or vesicular eruption. Buccal Mucosa- Bilateral- No Aphthous ulcers. Oropharynx- No Discharge or Erythema. Tonsils: Characteristics- Bilateral- No Erythema or Congestion. Size/Enlargement- Bilateral- No enlargement. Discharge- bilateral-None.  Neck Neck- Supple. No Masses.   Chest and Lung Exam Auscultation: Breath Sounds:-Clear even and unlabored.  Cardiovascular Auscultation:Rythm- Regular, rate and rhythm. Murmurs & Other Heart Sounds:Ausculatation of the heart reveal- No Murmurs.  Lymphatic Head & Neck General Head & Neck Lymphatics: Bilateral: Description- No Localized lymphadenopathy.      Assessment & Plan:   Your rt ear wax was removed successfully. Post lavage some redness to the TM likely traumatic. Will rx cortisporin otic drops. If any pain even with these drops let us know.  Follow up as regularly scheduled with pcp or as needed   Mackie Pai, PA-C

## 2017-03-30 ENCOUNTER — Ambulatory Visit (INDEPENDENT_AMBULATORY_CARE_PROVIDER_SITE_OTHER): Payer: No Typology Code available for payment source | Admitting: Medical

## 2017-03-30 ENCOUNTER — Encounter: Payer: Self-pay | Admitting: Medical

## 2017-03-30 VITALS — BP 145/75 | HR 83 | Temp 98.2°F | Resp 16 | Ht 67.0 in | Wt 197.8 lb

## 2017-03-30 DIAGNOSIS — R1013 Epigastric pain: Secondary | ICD-10-CM

## 2017-03-30 LAB — CBC WITH DIFFERENTIAL/PLATELET
BASOS ABS: 0 10*3/uL (ref 0.0–0.1)
BASOS PCT: 0.7 % (ref 0.0–3.0)
Eosinophils Absolute: 0.5 10*3/uL (ref 0.0–0.7)
Eosinophils Relative: 7.8 % — ABNORMAL HIGH (ref 0.0–5.0)
HEMATOCRIT: 41.4 % (ref 36.0–46.0)
Hemoglobin: 14.2 g/dL (ref 12.0–15.0)
LYMPHS ABS: 1.4 10*3/uL (ref 0.7–4.0)
Lymphocytes Relative: 23.2 % (ref 12.0–46.0)
MCHC: 34.3 g/dL (ref 30.0–36.0)
MCV: 95.6 fl (ref 78.0–100.0)
MONOS PCT: 8 % (ref 3.0–12.0)
Monocytes Absolute: 0.5 10*3/uL (ref 0.1–1.0)
NEUTROS ABS: 3.6 10*3/uL (ref 1.4–7.7)
Neutrophils Relative %: 60.3 % (ref 43.0–77.0)
PLATELETS: 205 10*3/uL (ref 150.0–400.0)
RBC: 4.33 Mil/uL (ref 3.87–5.11)
RDW: 13 % (ref 11.5–15.5)
WBC: 6 10*3/uL (ref 4.0–10.5)

## 2017-03-30 LAB — COMPREHENSIVE METABOLIC PANEL
ALT: 20 U/L (ref 0–35)
AST: 15 U/L (ref 0–37)
Albumin: 4.1 g/dL (ref 3.5–5.2)
Alkaline Phosphatase: 90 U/L (ref 39–117)
BUN: 23 mg/dL (ref 6–23)
CALCIUM: 9.2 mg/dL (ref 8.4–10.5)
CHLORIDE: 108 meq/L (ref 96–112)
CO2: 25 meq/L (ref 19–32)
Creatinine, Ser: 0.91 mg/dL (ref 0.40–1.20)
GFR: 65.63 mL/min (ref >60.00)
GLUCOSE: 98 mg/dL (ref 70–99)
POTASSIUM: 4.3 meq/L (ref 3.5–5.1)
Sodium: 142 mEq/L (ref 135–145)
Total Bilirubin: 0.2 mg/dL (ref 0.2–1.2)
Total Protein: 6.5 g/dL (ref 6.0–8.3)

## 2017-03-30 MED ORDER — RANITIDINE HCL 150 MG PO CAPS: 150.0000 mg | ORAL_CAPSULE | Freq: Two times a day (BID) | ORAL | 0 refills | Status: DC

## 2017-03-30 MED FILL — raNITIdine HCL 150 MG TABS: 150 | 30 days supply | Qty: 60 | Fill #0

## 2017-03-30 NOTE — Patient Instructions (Addendum)
For your recent abdomen pain will get cbc, cmp, h pylori and ifob cards  I do recommend for short term stop ibuprofen due to remote history of ulcer years ago.  Rx ranitidine. Start today.  If diarrhea restarts notify us.  Follow up in 7-10 days or as needed

## 2017-03-30 NOTE — Progress Notes (Signed)
Subjective:    Patient ID: Margaret [name], female    DOB: 01-31-1951, 67 y.o.   MRN: [id number]  HPI  Pt in with some upset stomach that  feels like it is burning since past Tuesday. Pt on Sunday had some diarrhea one time and then resolved. Diarrhea has not returned today. Pt does mention that her dog has stomach bacteria.  Pt has tried some tums/antiacids.  It has not help much.  Pt does report remote history of ulcer in 70's. She does take ibuprofen 2 tab po q am and then 2 in afternoon. No black or bloody stools.   Review of Systems  Constitutional: Negative for chills, fatigue and fever.  Respiratory: Negative for cough, chest tightness, shortness of breath and wheezing.   Cardiovascular: Negative for chest pain and palpitations.  Gastrointestinal: Positive for abdominal pain. Negative for abdominal distention, blood in stool, constipation, nausea and vomiting.  Musculoskeletal: Negative for back pain and joint swelling.  Skin: Negative for pallor, rash and wound.  Neurological: Negative for dizziness, syncope, speech difficulty, weakness, numbness and headaches.  Hematological: Negative for adenopathy. Does not bruise/bleed easily.  Psychiatric/Behavioral: Negative for behavioral problems and confusion.    Past Medical History:  Diagnosis Date  . Anxiety   . Arthritis 02/14/2013  . Bipolar disorder ([place])   . Esophageal reflux 09/19/2012   only slight with gallbladder problems  . Hypoglycemia   . Hypothyroidism   . Other malaise and fatigue 09/19/2012  . PONV (postoperative nausea and vomiting)   . Preventative health care 06/09/2016  . PTSD (post-traumatic stress disorder)   . Thyroid disease      Social History   Socioeconomic History  . Marital status: Divorced    Spouse name: Not on file  . Number of children: Not on file  . Years of education: Not on file  . Highest education level: Not on file  Social Needs  . Financial resource strain: Not on file  . Food  insecurity - worry: Not on file  . Food insecurity - inability: Not on file  . Transportation needs - medical: Not on file  . Transportation needs - non-medical: Not on file  Occupational History  . Not on file  Tobacco Use  . Smoking status: Former Smoker    Packs/day: 3.00    Years: 24.00    Pack years: 72.00    Types: Cigarettes    Last attempt to quit: 1995    Years since quitting: 24.1  . Smokeless tobacco: Never Used  Substance and Sexual Activity  . Alcohol use: No    Alcohol/week: 0.0 oz  . Drug use: No  . Sexual activity: Not on file  Other Topics Concern  . Not on file  Social History Narrative  . Not on file    Past Surgical History:  Procedure Laterality Date  . CHOLECYSTECTOMY N/A 09/01/2016   Procedure: LAPAROSCOPIC CHOLECYSTECTOMY WITH INTRAOPERATIVE CHOLANGIOGRAM;  Surgeon: Jovita Kussmaul, MD;  Location: [facility];  Service: General;  Laterality: N/A;  . 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 fooet surgery Left 2014   reconstruction of foot and ankle to correct deformity  . NECK SURGERY    . OTHER SURGICAL HISTORY    . OTHER SURGICAL HISTORY    . OTHER SURGICAL HISTORY    . TUBAL LIGATION     reversal    Family History  Problem Relation Age of Onset  . Diabetes Mother  Brother 1 of 2  . Heart failure Mother   . Prostate cancer Father   . Parkinsonism Father        deceased  . Hypertension Brother   . Esophageal cancer Maternal Grandmother   . Cancer Paternal Grandmother        cancer of jawbone 1 of 2  . Melanoma Brother   . Breast cancer Neg Hx   . Colon cancer Neg Hx     Allergies  Allergen Reactions  . Novocain [Procaine] Other (See Comments)    Heart race  . Codeine Nausea Only  . Darvon Other (See Comments)    Hallucinations   . Sulfa Drugs Cross Reactors Nausea And Vomiting    Current Outpatient Medications on File Prior to Visit  Medication Sig Dispense Refill  . amitriptyline (ELAVIL) 50 MG tablet Take 1 tablet (50 mg total) by mouth  at bedtime. 90 tablet 3  . carbamazepine (CARBATROL) 200 MG 12 hr capsule Take 1 capsule (200 mg total) by mouth 2 (two) times daily. 180 capsule 3  . Clobetasol Propionate 0.05 % lotion Apply 1 application topically 2 (two) times daily. (Patient taking differently: Apply 1 application topically daily as needed (eczema). ) 118 mL 1  . levothyroxine (SYNTHROID, LEVOTHROID) 112 MCG tablet Take 1 tablet (112 mcg total) by mouth daily before breakfast. 90 tablet 2  . neomycin-polymyxin-hydrocortisone (CORTISPORIN) OTIC solution Place 3 drops into the right ear 4 (four) times daily. 10 mL 0  . risperiDONE (RISPERDAL) 2 MG tablet Take 1 tablet (2 mg total) by mouth at bedtime. 90 tablet 3   No current facility-administered medications on file prior to visit.     BP (!) 145/75   Pulse 83   Temp 98.2 F (36.8 C) (Oral)   Resp 16   Ht 5\' 7"  (1.702 m)   Wt 197 lb 12.8 oz (89.7 kg)   SpO2 99%   BMI 30.98 kg/m       Objective:   Physical Exam  General Appearance- Not in acute distress.  HEENT Eyes- Scleraeral/Conjuntiva-bilat- Not Yellow. Mouth & Throat- Normal.  Chest and Lung Exam Auscultation: Breath sounds:-Normal. Adventitious sounds:- No Adventitious sounds.  Cardiovascular Auscultation:Rythm - Regular. Heart Sounds -Normal heart sounds.  Abdomen Inspection:-Inspection Normal.  Palpation/Perucssion: Palpation and Percussion of the abdomen reveal- faintTender, No Rebound tenderness, No rigidity(Guarding) and No Palpable abdominal masses.  Liver:-Normal.  Spleen:- Normal.   Back- no cva tenderness      Assessment & Plan:  For your recent abdomen pain will get cbc, cmp, h pylori and ifob cards.  I do recommend for short term stop ibuprofen due to remote history of ulcer years ago.  Rx ranitidine. Start today.  If diarrhea restarts notify us.  Follow up in 7-10 days or as needed

## 2017-03-31 LAB — H. PYLORI BREATH TEST: H. PYLORI BREATH TEST: NOT DETECTED

## 2017-04-06 MED FILL — LEVOTHYROXINE 112 MCG TAB: 112 | 90 days supply | Qty: 90 | Fill #0

## 2017-05-12 ENCOUNTER — Ambulatory Visit (HOSPITAL_COMMUNITY): Payer: No Typology Code available for payment source | Admitting: Psychiatry

## 2017-05-12 ENCOUNTER — Encounter (HOSPITAL_COMMUNITY): Payer: Self-pay | Admitting: Psychiatry

## 2017-05-12 DIAGNOSIS — F319 Bipolar disorder, unspecified: Secondary | ICD-10-CM | POA: Diagnosis not present

## 2017-05-12 MED ORDER — RISPERIDONE 2 MG PO TABS: 2.0000 mg | ORAL_TABLET | Freq: Every day | ORAL | 3 refills | Status: DC

## 2017-05-12 MED ORDER — CARBAMAZEPINE ER 200 MG PO CP12: 200.0000 mg | ORAL_CAPSULE | Freq: Two times a day (BID) | ORAL | 3 refills | Status: DC

## 2017-05-12 MED ORDER — AMITRIPTYLINE HCL 50 MG PO TABS: 50.0000 mg | ORAL_TABLET | Freq: Every day | ORAL | 3 refills | Status: DC

## 2017-05-12 NOTE — Progress Notes (Signed)
BH MD/PA/NP OP Progress Note  05/12/2017 8:34 AM Margaret [name]  MRN:  [id number]  Chief Complaint: med management   HPI: Margaret [name] presents for med management for bipolar I.  Her symptoms have been quite stable for the past year, she reports that she has been doing well and has not had the need to follow-up any more frequently.  We agreed to check and yearly.  She has had labs checked recently, so no need for updated labs today.  She is sleeping well, reports her mood has been very stable, and she is consistent with taking her Tegretol, Elavil, risperidone.  Denies any EPS or any symptoms of TD.  I educated her on symptoms of neuroleptic toxicity and EPS to make sure she is always cognizant of the side effects if they were to come about, especially given that she is aging.  Reports that she is working on trying to lose some weight, and knows that her sugar cravings tend to be a problem.  We will follow-up in one year or sooner if needed.  Visit Diagnosis:    ICD-10-CM   1. Bipolar 1 disorder ([facility]) F31.9 carbamazepine (CARBATROL) 200 MG 12 hr capsule    risperiDONE (RISPERDAL) 2 MG tablet    amitriptyline (ELAVIL) 50 MG tablet    Past Psychiatric History: See intake H&P for full details. Reviewed, with no updates at this time.   Past Medical History:  Past Medical History:  Diagnosis Date  . Anxiety   . Arthritis 02/14/2013  . Bipolar disorder ([place])   . Esophageal reflux 09/19/2012   only slight with gallbladder problems  . Hypoglycemia   . Hypothyroidism   . Other malaise and fatigue 09/19/2012  . PONV (postoperative nausea and vomiting)   . Preventative health care 06/09/2016  . PTSD (post-traumatic stress disorder)   . Thyroid disease     Past Surgical History:  Procedure Laterality Date  . CHOLECYSTECTOMY N/A 09/01/2016   Procedure: LAPAROSCOPIC CHOLECYSTECTOMY WITH INTRAOPERATIVE CHOLANGIOGRAM;  Surgeon: Jovita Kussmaul, MD;  Location: [facility];  Service: General;  Laterality: N/A;   . 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 fooet surgery Left 2014   reconstruction of foot and ankle to correct deformity  . NECK SURGERY    . OTHER SURGICAL HISTORY    . OTHER SURGICAL HISTORY    . OTHER SURGICAL HISTORY    . TUBAL LIGATION     reversal    Family Psychiatric History: See intake H&P for full details. Reviewed, with no updates at this time.   Family History:  Family History  Problem Relation Age of Onset  . Diabetes Mother        Brother 1 of 2  . Heart failure Mother   . Prostate cancer Father   . Parkinsonism Father        deceased  . Hypertension Brother   . Esophageal cancer Maternal Grandmother   . Cancer Paternal Grandmother        cancer of jawbone 1 of 2  . Melanoma Brother   . Breast cancer Neg Hx   . Colon cancer Neg Hx     Social History:  Social History   Socioeconomic History  . Marital status: Divorced    Spouse name: Not on file  . Number of children: Not on file  . Years of education: Not on file  . Highest education level: Not on file  Occupational History  . Not on file  Social  Needs  . Financial resource strain: Not on file  . Food insecurity:    Worry: Not on file    Inability: Not on file  . Transportation needs:    Medical: Not on file    Non-medical: Not on file  Tobacco Use  . Smoking status: Former Smoker    Packs/day: 3.00    Years: 24.00    Pack years: 72.00    Types: Cigarettes    Last attempt to quit: 1995    Years since quitting: 24.2  . Smokeless tobacco: Never Used  Substance and Sexual Activity  . Alcohol use: No    Alcohol/week: 0.0 oz  . Drug use: No  . Sexual activity: Not on file  Lifestyle  . Physical activity:    Days per week: Not on file    Minutes per session: Not on file  . Stress: Not on file  Relationships  . Social connections:    Talks on phone: Not on file    Gets together: Not on file    Attends religious service: Not on file    Active member of club or organization: Not on file     Attends meetings of clubs or organizations: Not on file    Relationship status: Not on file  Other Topics Concern  . Not on file  Social History Narrative  . Not on file    Allergies:  Allergies  Allergen Reactions  . Novocain [Procaine] Other (See Comments)    Heart race  . Codeine Nausea Only  . Darvon Other (See Comments)    Hallucinations   . Sulfa Drugs Cross Reactors Nausea And Vomiting    Metabolic Disorder Labs: Lab Results  Component Value Date   HGBA1C 5.7 03/16/2017   MPG 114 08/28/2014   MPG 131 (H) 06/20/2013   No results found for: PROLACTIN Lab Results  Component Value Date   CHOL 219 (H) 03/16/2017   TRIG 185.0 (H) 03/16/2017   HDL 63.70 03/16/2017   CHOLHDL 3 03/16/2017   VLDL 37.0 03/16/2017   LDLCALC 118 (H) 03/16/2017   LDLCALC 140 (H) 03/17/2016   Lab Results  Component Value Date   TSH 1.66 03/16/2017   TSH 0.55 09/26/2016    Therapeutic Level Labs: Lab Results  Component Value Date   LITHIUM 1.00 07/28/2013   LITHIUM 0.90 06/07/2012   No results found for: VALPROATE No components found for:  CBMZ  Current Medications: Current Outpatient Medications  Medication Sig Dispense Refill  . amitriptyline (ELAVIL) 50 MG tablet Take 1 tablet (50 mg total) by mouth at bedtime. 90 tablet 3  . carbamazepine (CARBATROL) 200 MG 12 hr capsule Take 1 capsule (200 mg total) by mouth 2 (two) times daily. 180 capsule 3  . Clobetasol Propionate 0.05 % lotion Apply 1 application topically 2 (two) times daily. (Patient taking differently: Apply 1 application topically daily as needed (eczema). ) 118 mL 1  . levothyroxine (SYNTHROID, LEVOTHROID) 112 MCG tablet Take 1 tablet (112 mcg total) by mouth daily before breakfast. 90 tablet 2  . neomycin-polymyxin-hydrocortisone (CORTISPORIN) OTIC solution Place 3 drops into the right ear 4 (four) times daily. 10 mL 0  . ranitidine (ZANTAC) 150 MG capsule Take 1 capsule (150 mg total) by mouth 2 (two) times daily. 60  capsule 0  . risperiDONE (RISPERDAL) 2 MG tablet Take 1 tablet (2 mg total) by mouth at bedtime. 90 tablet 3   No current facility-administered medications for this visit.  Musculoskeletal: Strength & Muscle Tone: within normal limits Gait & Station: normal Patient leans: N/A  Psychiatric Specialty Exam: ROS  Blood pressure 126/74, pulse 94, height 5\' 7"  (1.702 m), weight 199 lb 3.2 oz (90.4 kg).Body mass index is 31.2 kg/m.  General Appearance: Casual and Fairly Groomed  Eye Contact:  Good  Speech:  Clear and Coherent and Normal Rate  Volume:  Normal  Mood:  Euthymic  Affect:  Appropriate and Congruent  Thought Process:  Goal Directed and Descriptions of Associations: Intact  Orientation:  Full (Time, Place, and Person)  Thought Content: Logical   Suicidal Thoughts:  No  Homicidal Thoughts:  No  Memory:  Immediate;   Fair  Judgement:  Good  Insight:  Good  Psychomotor Activity:  Normal  Concentration:  Concentration: Good  Recall:  Good  Fund of Knowledge: Good  Language: Good  Akathisia:  Negative  Handed:  Right  AIMS (if indicated): not done  Assets:  Communication Skills Desire for Improvement Financial Resources/Insurance Housing Leisure Time [place] Talents/Skills Transportation Vocational/Educational  ADL's:  Intact  Cognition: WNL  Sleep:  Good   Screenings: PHQ2-9     Office Visit from 06/09/2016 in [facility] at [facility] Visit from confidential encounter on 02/01/2015  PHQ-2 Total Score  0  1      Assessment and Plan:  LUKE RIGSBEE presents with stable mood, sleep and affect.  Remains adherent to medication therapies with side effects. No acute safety issues, we will follow up in 12 months or sooner if needed.  1. Bipolar 1 disorder ([place])    Status of current problems: stable  Labs Ordered: No orders of the defined types were placed in this encounter.   Labs  Reviewed:  Lab Results  Component Value Date   WBC 6.0 03/30/2017   HGB 14.2 03/30/2017   HCT 41.4 03/30/2017   MCV 95.6 03/30/2017   PLT 205.0 03/30/2017     Chemistry      Component Value Date/Time   NA 142 03/30/2017 1157   K 4.3 03/30/2017 1157   CL 108 03/30/2017 1157   CO2 25 03/30/2017 1157   BUN 23 03/30/2017 1157   CREATININE 0.91 03/30/2017 1157   CREATININE 1.04 (H) 02/01/2015 1456      Component Value Date/Time   CALCIUM 9.2 03/30/2017 1157   ALKPHOS 90 03/30/2017 1157   AST 15 03/30/2017 1157   ALT 20 03/30/2017 1157   BILITOT 0.2 03/30/2017 1157      Collateral Obtained/Records Reviewed: n/a  Plan:  Continue the current dose of Tegretol, risperidone, Elavil Return to clinic in 1 year  I spent 20 minutes with the patient in direct face-to-face clinical care.  Greater than 50% of this time was spent in counseling and coordination of care with the patient.    Aundra Dubin, MD 05/12/2017, 8:34 AM

## 2017-05-26 ENCOUNTER — Telehealth: Payer: Self-pay

## 2017-05-26 DIAGNOSIS — E2839 Other primary ovarian failure: Secondary | ICD-10-CM

## 2017-05-26 DIAGNOSIS — Z1239 Encounter for other screening for malignant neoplasm of breast: Secondary | ICD-10-CM

## 2017-05-26 NOTE — Telephone Encounter (Signed)
Copied from [place] [id number]. Topic: Referral - Request >> May 25, 2017  4:20 PM Arletha Grippe wrote: Reason for CRM: pt needs referral for mammogram and dexa. She would like to have it done this wednesday or Thursday  cb is [phone]

## 2017-05-26 NOTE — Telephone Encounter (Signed)
Orders placed patient notified.

## 2017-05-27 ENCOUNTER — Telehealth: Payer: Self-pay | Admitting: Family Medicine

## 2017-05-27 ENCOUNTER — Ambulatory Visit (HOSPITAL_BASED_OUTPATIENT_CLINIC_OR_DEPARTMENT_OTHER)
Admission: RE | Admit: 2017-05-27 | Discharge: 2017-05-27 | Disposition: A | Discharge status: Home / Self Care | Payer: No Typology Code available for payment source | Source: Ambulatory Visit | Attending: Family Medicine | Admitting: Family Medicine

## 2017-05-27 DIAGNOSIS — Z1231 Encounter for screening mammogram for malignant neoplasm of breast: Secondary | ICD-10-CM | POA: Insufficient documentation

## 2017-05-27 DIAGNOSIS — Z1239 Encounter for other screening for malignant neoplasm of breast: Secondary | ICD-10-CM

## 2017-05-27 DIAGNOSIS — B353 Tinea pedis: Secondary | ICD-10-CM

## 2017-05-27 DIAGNOSIS — E2839 Other primary ovarian failure: Secondary | ICD-10-CM

## 2017-05-27 NOTE — Telephone Encounter (Signed)
Copied from [place] [id number]. Topic: Referral - Request >> May 27, 2017  2:57 PM Percell Belt A wrote: Reason for CRM: pt is needing referral for ins plan [employer], she would like a referral to the foot center for [place] [facility] feet

## 2017-06-05 ENCOUNTER — Ambulatory Visit: Payer: No Typology Code available for payment source | Admitting: Sports Medicine

## 2017-06-05 ENCOUNTER — Encounter: Payer: Self-pay | Admitting: Sports Medicine

## 2017-06-05 DIAGNOSIS — M79671 Pain in right foot: Secondary | ICD-10-CM

## 2017-06-05 DIAGNOSIS — M79672 Pain in left foot: Secondary | ICD-10-CM | POA: Diagnosis not present

## 2017-06-05 DIAGNOSIS — L853 Xerosis cutis: Secondary | ICD-10-CM

## 2017-06-05 MED FILL — CARBAMAZEPINE ER 200 MG CAP: 200 | 90 days supply | Qty: 180 | Fill #0

## 2017-06-05 MED FILL — risperiDONE 2 MG TABS: 2 | 90 days supply | Qty: 90 | Fill #0

## 2017-06-05 MED FILL — AMITRIPTYLINE HCL 50 MG TAB: 50 | 90 days supply | Qty: 90 | Fill #0

## 2017-06-05 NOTE — Progress Notes (Signed)
Subjective: Margaret [name] is a 67 y.o. female patient who presents to office for evaluation of Right> Left foot pain secondary to callus skin at her heels. Patient reports that she has tried a pumice stones and over-the-counter lotions with no relief states that this has been going on for several years.  Patient denies a history of diabetes denies a history of infection denies history of use of blood thinners.  Patient denies any other pedal complaints.   Review of Systems  All other systems reviewed and are negative.    Patient Active Problem List   Diagnosis Date Noted  . Upper respiratory infection 03/16/2017  . Preventative health care 06/09/2016  . 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 Kidney stone 11/05/2014  . Alopecia 11/07/2013  . 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 Urinary problem 05/30/2013  . Tinea pedis 03/07/2013  . Arthritis 02/14/2013  . Renal insufficiency 12/26/2012  . Diarrhea 12/26/2012  . Other malaise and fatigue 09/19/2012  . Hyperglycemia 09/19/2012  . Esophageal reflux 09/19/2012  . Cervical cancer screening 01/05/2012  . Hyperlipidemia 01/05/2012  . Hypothyroidism 07/27/2011  . Bipolar disorder ([place]) 07/27/2011    Current Outpatient Medications on File Prior to Visit  Medication Sig Dispense Refill  . amitriptyline (ELAVIL) 50 MG tablet Take 1 tablet (50 mg total) by mouth at bedtime. 90 tablet 3  . carbamazepine (CARBATROL) 200 MG 12 hr capsule Take 1 capsule (200 mg total) by mouth 2 (two) times daily. 180 capsule 3  . Clobetasol Propionate 0.05 % lotion Apply 1 application topically 2 (two) times daily. (Patient taking differently: Apply 1 application topically daily as needed (eczema). ) 118 mL 1  . levothyroxine (SYNTHROID, LEVOTHROID) 112 MCG tablet Take 1 tablet (112 mcg total) by mouth daily before breakfast. 90 tablet 2  . neomycin-polymyxin-hydrocortisone (CORTISPORIN) OTIC solution Place 3 drops  into the right ear 4 (four) times daily. 10 mL 0  . ranitidine (ZANTAC) 150 MG capsule Take 1 capsule (150 mg total) by mouth 2 (two) times daily. 60 capsule 0  . risperiDONE (RISPERDAL) 2 MG tablet Take 1 tablet (2 mg total) by mouth at bedtime. 90 tablet 3   No current facility-administered medications on file prior to visit.     Allergies  Allergen Reactions  . Novocain [Procaine] Other (See Comments)    Heart race  . Codeine Nausea Only  . Darvon Other (See Comments)    Hallucinations   . Sulfa Drugs Cross Reactors Nausea And Vomiting    Objective:  General: Alert and oriented x3 in no acute distress  Dermatology: Keratotic lesion present posterior plantar heels with deep fissuring and skin lines transversing the lesion, pain is present with direct pressure to the lesions consistent with dry skin to the heels/heel callus, no webspace macerations, no ecchymosis bilateral, all nails x 10 are well manicured.  Vascular: Dorsalis Pedis and Posterior Tibial pedal pulses 2/4, Capillary Fill Time 3 seconds, + pedal hair growth bilateral, no edema bilateral lower extremities, mild varicosities, temperature gradient within normal limits.  Neurology: Johney Maine sensation intact via light touch bilateral.  Musculoskeletal: Mild tenderness with palpation at the keratotic lesion sites at heels on Right>Left, Muscular strength 5/5 in all groups without pain or limitation on range of motion. No symptomatic lower extremity muscular or boney deformity noted.  Assessment and Plan: Problem List Items Addressed This Visit    None    Visit Diagnoses    Pain of both  heels    -  Primary   Dry skin         -Complete examination performed -Discussed treatment options -Recommend provided RevitaDerm cream if patient cannot afford this cream with the stone that comes with it recommend patient to look into getting [place] healthy feet cream or [place] honey under occlusion -Advised good supportive shoes  and inserts -Patient to return to office in 6 weeks for follow-up evaluation or sooner if condition worsens.  Landis Martins, DPM

## 2017-06-05 NOTE — Patient Instructions (Signed)
May use Revitaderm or [place] healthy feet can be purchased over-the-counter for dry callus skin at heels You may also try [place] honey as well to heels  Be sure when you use cream to wrap your heels at night with Saran wrap every other night and cover with socks for about 1 hour to help with dryness to heels

## 2017-06-17 ENCOUNTER — Ambulatory Visit (INDEPENDENT_AMBULATORY_CARE_PROVIDER_SITE_OTHER): Payer: No Typology Code available for payment source | Admitting: Medical

## 2017-06-17 ENCOUNTER — Encounter: Payer: Self-pay | Admitting: Medical

## 2017-06-17 VITALS — BP 129/84 | HR 90 | Temp 97.9°F | Resp 16 | Ht 67.0 in | Wt 199.8 lb

## 2017-06-17 DIAGNOSIS — H6123 Impacted cerumen, bilateral: Secondary | ICD-10-CM

## 2017-06-17 DIAGNOSIS — H60503 Unspecified acute noninfective otitis externa, bilateral: Secondary | ICD-10-CM | POA: Diagnosis not present

## 2017-06-17 MED ORDER — NEOMYCIN-POLYMYXIN-HC 3.5-10000-1 OT SOLN: 3.0000 [drp] | Freq: Four times a day (QID) | OTIC | 0 refills | Status: DC

## 2017-06-17 MED ORDER — AMOXICILLIN-POT CLAVULANATE 875-125 MG PO TABS: 1.0000 | ORAL_TABLET | Freq: Two times a day (BID) | ORAL | 0 refills | Status: DC

## 2017-06-17 MED FILL — NEO/POLYMYXIN/HC EAR SOLN: 3.5-10000-1 | 17 days supply | Qty: 10 | Fill #0

## 2017-06-17 NOTE — Patient Instructions (Addendum)
You did have bilateral cerumen impaction.  Right side worse than the left.  The left side appears to have more presentation of otitis externa with very small piece of wax impacted against tympanic membrane.  Visualization of the left side is a bit difficult due to the external canal swelling.  Nurse irrigated out right side canal today.  For the left side I prescribed Cortisporin otic drops.  Use as directed.  When the swelling goes down completely it will be easier to visualize the wax present and remove wax from the left side.  Today concerned that irrigation of left side would cause excessive pain and wax would be difficult to remove due to degree of canal swelling.  Follow-up in 1 week or as needed.  You expressed concern about getting your infection.  Can see any portion of the TM presently.  I can give you a print prescription of Augmentin just use in the event of severe, constant type ear pain on the left side.

## 2017-06-17 NOTE — Progress Notes (Signed)
Subjective:    Patient ID: Margaret [name], female    DOB: 07/01/1950, 67 y.o.   MRN: [id number]  HPI Patient does report since Monday both the ears have felt clogged/blocked.  Has decreased hearing.  The left side seem to occur directly after she tried to clear wax out with Q-tip.  Patient had no fevers, chills, or sweats.  No proceeding recent nasal congestion.  Does report some intermittent mild deep left ear pain but nothing is constant or severe.   Review of Systems  Constitutional: Negative for chills, fatigue and fever.  HENT: Negative for congestion, facial swelling, postnasal drip, rhinorrhea, sinus pain, sneezing and sore throat.        Left ear discomfort  Respiratory: Negative for cough, chest tightness, shortness of breath and wheezing.   Cardiovascular: Negative for chest pain and palpitations.  Gastrointestinal: Negative for abdominal pain.  Musculoskeletal: Negative for back pain.  Skin: Negative for rash.  Neurological: Negative for dizziness, seizures and headaches.  Hematological: Negative for adenopathy. Does not bruise/bleed easily.  Psychiatric/Behavioral: Negative for behavioral problems and confusion.    Past Medical History:  Diagnosis Date  . Anxiety   . Arthritis 02/14/2013  . Bipolar disorder ([place])   . Esophageal reflux 09/19/2012   only slight with gallbladder problems  . Hypoglycemia   . Hypothyroidism   . Other malaise and fatigue 09/19/2012  . PONV (postoperative nausea and vomiting)   . Preventative health care 06/09/2016  . PTSD (post-traumatic stress disorder)   . Thyroid disease      Social History   Socioeconomic History  . Marital status: Divorced    Spouse name: Not on file  . Number of children: Not on file  . Years of education: Not on file  . Highest education level: Not on file  Occupational History  . Not on file  Social Needs  . Financial resource strain: Not on file  . Food insecurity:    Worry: Not on file    Inability: Not on  file  . Transportation needs:    Medical: Not on file    Non-medical: Not on file  Tobacco Use  . Smoking status: Former Smoker    Packs/day: 3.00    Years: 24.00    Pack years: 72.00    Types: Cigarettes    Last attempt to quit: 1995    Years since quitting: 24.3  . Smokeless tobacco: Never Used  Substance and Sexual Activity  . Alcohol use: No    Alcohol/week: 0.0 oz  . Drug use: No  . Sexual activity: Not on file  Lifestyle  . Physical activity:    Days per week: Not on file    Minutes per session: Not on file  . Stress: Not on file  Relationships  . Social connections:    Talks on phone: Not on file    Gets together: Not on file    Attends religious service: Not on file    Active member of club or organization: Not on file    Attends meetings of clubs or organizations: Not on file    Relationship status: Not on file  . Intimate partner violence:    Fear of current or ex partner: Not on file    Emotionally abused: Not on file    Physically abused: Not on file    Forced sexual activity: Not on file  Other Topics Concern  . Not on file  Social History Narrative  . Not on file  Past Surgical History:  Procedure Laterality Date  . CHOLECYSTECTOMY N/A 09/01/2016   Procedure: LAPAROSCOPIC CHOLECYSTECTOMY WITH INTRAOPERATIVE CHOLANGIOGRAM;  Surgeon: Jovita Kussmaul, MD;  Location: [facility];  Service: General;  Laterality: N/A;  . 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 fooet surgery Left 2014   reconstruction of foot and ankle to correct deformity  . NECK SURGERY    . OTHER SURGICAL HISTORY    . OTHER SURGICAL HISTORY    . OTHER SURGICAL HISTORY    . TUBAL LIGATION     reversal    Family History  Problem Relation Age of Onset  . Diabetes Mother        Brother 1 of 2  . Heart failure Mother   . Prostate cancer Father   . Parkinsonism Father        deceased  . Hypertension Brother   . Esophageal cancer Maternal Grandmother   . Cancer Paternal Grandmother        cancer  of jawbone 1 of 2  . Melanoma Brother   . Breast cancer Neg Hx   . Colon cancer Neg Hx     Allergies  Allergen Reactions  . Novocain [Procaine] Other (See Comments)    Heart race  . Codeine Nausea Only  . Darvon Other (See Comments)    Hallucinations   . Sulfa Drugs Cross Reactors Nausea And Vomiting    Current Outpatient Medications on File Prior to Visit  Medication Sig Dispense Refill  . amitriptyline (ELAVIL) 50 MG tablet Take 1 tablet (50 mg total) by mouth at bedtime. 90 tablet 3  . carbamazepine (CARBATROL) 200 MG 12 hr capsule Take 1 capsule (200 mg total) by mouth 2 (two) times daily. 180 capsule 3  . Clobetasol Propionate 0.05 % lotion Apply 1 application topically 2 (two) times daily. (Patient taking differently: Apply 1 application topically daily as needed (eczema). ) 118 mL 1  . levothyroxine (SYNTHROID, LEVOTHROID) 112 MCG tablet Take 1 tablet (112 mcg total) by mouth daily before breakfast. 90 tablet 2  . neomycin-polymyxin-hydrocortisone (CORTISPORIN) OTIC solution Place 3 drops into the right ear 4 (four) times daily. 10 mL 0  . ranitidine (ZANTAC) 150 MG capsule Take 1 capsule (150 mg total) by mouth 2 (two) times daily. 60 capsule 0  . risperiDONE (RISPERDAL) 2 MG tablet Take 1 tablet (2 mg total) by mouth at bedtime. 90 tablet 3   No current facility-administered medications on file prior to visit.     BP 129/84   Pulse 90   Temp 97.9 F (36.6 C) (Oral)   Resp 16   Ht 5\' 7"  (1.702 m)   Wt 199 lb 12.8 oz (90.6 kg)   SpO2 98%   BMI 31.29 kg/m       Objective:   Physical Exam  General  Mental Status - Alert. General Appearance - Well groomed. Not in acute distress.  Skin Rashes- No Rashes.  HEENT Head- Normal. Ear Auditory Canal - Left-moderatesever canal swelling no large amount of wax seen but do believe that he sees small wax that is just adjacent to the tympanic membrane.  No portion of the tympanic membrane seen. Right -has moderate amount  of wax present.(post lavage canal cleared and tm normal)  The canal does not look swollen.  Tympanic Membrane- Left-not seen right- Normal. Eye Sclera/Conjunctiva- Left- Normal. Right- Normal. Nose & Sinuses Nasal Mucosa- Left-not boggy and Congested. Right-not boggy and  Congested.Bilateral no maxillary and no frontal sinus  pressure.  Bilateral- No enlargement. Discharge- bilateral-None.  Neck Neck- Supple. No Masses.   Chest and Lung Exam Auscultation: Breath Sounds:-Clear even and unlabored.  Cardiovascular Auscultation:Rythm- Regular, rate and rhythm. Murmurs & Other Heart Sounds:Ausculatation of the heart reveal- No Murmurs.  Lymphatic Head & Neck General Head & Neck Lymphatics: Bilateral: Description- No Localized lymphadenopathy.      Assessment & Plan:  You did have bilateral cerumen impaction.  Right side worse than the left.  The left side appears to have more presentation of otitis externa with very small piece of wax impacted against tympanic membrane.  Visualization of the left side is a bit difficult due to the external canal swelling.  Nurse irrigated out right side canal today.  For the left side I prescribed Cortisporin otic drops.  Use as directed.  When the swelling goes down completely it will be easier to visualize the wax present and remove wax from the left side.  Today concerned that irrigation of left side would cause excessive pain and wax would be difficult to remove due to degree of canal swelling.  Follow-up in 1 week or as needed.  You expressed concern about getting your infection.  Can see any portion of the TM presently.  I can give you a print prescription of Augmentin just use in the event of severe, constant type ear pain on the left side.  Mackie Pai, PA-C

## 2017-06-18 NOTE — Telephone Encounter (Signed)
Done

## 2017-07-02 MED FILL — LEVOTHYROXINE 112 MCG TAB: 112 | 90 days supply | Qty: 90 | Fill #1

## 2017-07-03 ENCOUNTER — Ambulatory Visit: Payer: No Typology Code available for payment source | Admitting: Sports Medicine

## 2017-07-03 ENCOUNTER — Ambulatory Visit: Payer: No Typology Code available for payment source | Admitting: Family Medicine

## 2017-07-03 MED FILL — AMOX-CLAV 875-125 MG TABLET: 875-125 | 10 days supply | Qty: 20 | Fill #0

## 2017-09-07 MED FILL — risperiDONE 2 MG TABS: 2 | 90 days supply | Qty: 90 | Fill #1

## 2017-09-07 MED FILL — AMITRIPTYLINE HCL 50 MG TAB: 50 | 90 days supply | Qty: 90 | Fill #1

## 2017-09-25 MED FILL — LEVOTHYROXINE 112 MCG TAB: 112 | 90 days supply | Qty: 90 | Fill #2

## 2017-09-28 MED FILL — CARBAMAZEPINE ER 200 MG CAP: 200 | 90 days supply | Qty: 180 | Fill #1

## 2017-12-02 ENCOUNTER — Other Ambulatory Visit: Payer: Self-pay | Admitting: Family Medicine

## 2017-12-02 MED FILL — risperiDONE 2 MG TABS: 2 | 90 days supply | Qty: 90 | Fill #2

## 2017-12-02 MED FILL — AMITRIPTYLINE HCL 50 MG TAB: 50 | 90 days supply | Qty: 90 | Fill #2

## 2017-12-04 MED FILL — LEVOTHYROXINE 112 MCG TAB: 112 | 90 days supply | Qty: 90 | Fill #0

## 2017-12-21 MED FILL — CARBAMAZEPINE ER 200 MG CAP: 200 | 30 days supply | Qty: 60 | Fill #2

## 2018-01-11 ENCOUNTER — Ambulatory Visit: Payer: Self-pay

## 2018-01-11 DIAGNOSIS — E039 Hypothyroidism, unspecified: Secondary | ICD-10-CM

## 2018-01-11 NOTE — Telephone Encounter (Signed)
Incoming call from patient stating that she is having  she is having thyroid issues.  .  Reports her hair is falling out.  , has been gaining weight.  Ringing of the ears.  , denies heart symptons.   Patient request an order for a thyroid panel for this afternoon, because she is off the rest of the day.  If ordered today, she ca go without being penalized for leaving work.  Patient number is [phone].  Patient states it is Urgent that she can go for lab work today.  Request a phone call please.  Encouraged Patient to call back if symptoms worsen . Patient agrees. Really request that Dr. Charlett Blake to order lab for today.   Patient states " I don't need to see a  Visit.  Just wants a lab visit.    Reason for Disposition . [1] Skipped or extra beat(s) AND [2] occurs < 4 times / minute  Answer Assessment - Initial Assessment Questions 1. DESCRIPTION: "Please describe your heart rate or heart beat that you are having" (e.g., fast/slow, regular/irregular, skipped or extra beats, "palpitations")     Irregular beats 2. ONSET: "When did it start?" (Minutes, hours or days)      *No Answer*a week and half 3. DURATION: "How long does it last" (e.g., seconds, minutes, hours)     sporatic 4. PATTERN "Does it come and go, or has it been constant since it started?"  "Does it get worse with exertion?"   "Are you feeling it now?"     Comes and goes 5. TAP: "Using your hand, can you tap out what you are feeling on a chair or table in front of you, so that I can hear?" (Note: not all patients can do this)       *No Answer* 6. HEART RATE: "Can you tell me your heart rate?" "How many beats in 15 seconds?"  (Note: not all patients can do this)       Not aware of how to count pulse.  7. RECURRENT SYMPTOM: "Have you ever had this before?" If so, ask: "When was the last time?" and "What happened that time?"      Recurrent  8. CAUSE: "What do you think is causing the palpitations?"     Thyroid issue 9. CARDIAC HISTORY:  "Do you have any history of heart disease?" (e.g., heart attack, angina, bypass surgery, angioplasty, arrhythmia)      no 10. OTHER SYMPTOMS: "Do you have any other symptoms?" (e.g., dizziness, chest pain, sweating, difficulty breathing)       no 11. PREGNANCY: "Is there any chance you are pregnant?" "When was your last menstrual period?"       no  Protocols used: HEART RATE AND HEARTBEAT QUESTIONS-A-AH

## 2018-01-12 ENCOUNTER — Ambulatory Visit (HOSPITAL_COMMUNITY): Payer: No Typology Code available for payment source | Admitting: Psychiatry

## 2018-01-12 NOTE — Telephone Encounter (Signed)
OK to order her a TSH and T4 for hypothyroid and then if this does not explain the symptoms and she does not feel better then she should come in for evaluation

## 2018-01-13 NOTE — Telephone Encounter (Signed)
Author left detailed VM, asking for pt. to make lab appointment if possible at earliest convenience. Lab orders placed per Dr. Charlett Blake.

## 2018-01-13 NOTE — Addendum Note (Signed)
Addended by: Raynelle Dick R on: 01/13/2018 10:48 AM   Modules accepted: Orders

## 2018-01-15 ENCOUNTER — Ambulatory Visit (INDEPENDENT_AMBULATORY_CARE_PROVIDER_SITE_OTHER): Payer: No Typology Code available for payment source | Admitting: Medical

## 2018-01-15 ENCOUNTER — Encounter: Payer: Self-pay | Admitting: Medical

## 2018-01-15 VITALS — BP 127/63 | HR 76 | Temp 98.2°F | Resp 16 | Ht 67.0 in | Wt 202.6 lb

## 2018-01-15 DIAGNOSIS — E039 Hypothyroidism, unspecified: Secondary | ICD-10-CM | POA: Diagnosis not present

## 2018-01-15 DIAGNOSIS — R5383 Other fatigue: Secondary | ICD-10-CM

## 2018-01-15 DIAGNOSIS — R002 Palpitations: Secondary | ICD-10-CM | POA: Diagnosis not present

## 2018-01-15 LAB — COMPREHENSIVE METABOLIC PANEL
ALBUMIN: 4.3 g/dL (ref 3.5–5.2)
ALT: 10 U/L (ref 0–35)
AST: 11 U/L (ref 0–37)
Alkaline Phosphatase: 76 U/L (ref 39–117)
BUN: 20 mg/dL (ref 6–23)
CHLORIDE: 107 meq/L (ref 96–112)
CO2: 27 mEq/L (ref 19–32)
CREATININE: 0.95 mg/dL (ref 0.40–1.20)
Calcium: 9.4 mg/dL (ref 8.4–10.5)
GFR: 62.3 mL/min (ref >60.00)
Glucose, Bld: 95 mg/dL (ref 70–99)
Potassium: 4.4 mEq/L (ref 3.5–5.1)
Sodium: 142 mEq/L (ref 135–145)
Total Bilirubin: 0.3 mg/dL (ref 0.2–1.2)
Total Protein: 6.4 g/dL (ref 6.0–8.3)

## 2018-01-15 LAB — CBC WITH DIFFERENTIAL/PLATELET
BASOS PCT: 0.4 % (ref 0.0–3.0)
Basophils Absolute: 0 10*3/uL (ref 0.0–0.1)
EOS PCT: 4.1 % (ref 0.0–5.0)
Eosinophils Absolute: 0.2 10*3/uL (ref 0.0–0.7)
HCT: 44 % (ref 36.0–46.0)
HEMOGLOBIN: 15.1 g/dL — AB (ref 12.0–15.0)
LYMPHS PCT: 22.5 % (ref 12.0–46.0)
Lymphs Abs: 1.1 10*3/uL (ref 0.7–4.0)
MCHC: 34.2 g/dL (ref 30.0–36.0)
MCV: 96.6 fl (ref 78.0–100.0)
Monocytes Absolute: 0.5 10*3/uL (ref 0.1–1.0)
Monocytes Relative: 10.2 % (ref 3.0–12.0)
Neutro Abs: 3.1 10*3/uL (ref 1.4–7.7)
Neutrophils Relative %: 62.8 % (ref 43.0–77.0)
Platelets: 212 10*3/uL (ref 150.0–400.0)
RBC: 4.55 Mil/uL (ref 3.87–5.11)
RDW: 12.9 % (ref 11.5–15.5)
WBC: 4.9 10*3/uL (ref 4.0–10.5)

## 2018-01-15 LAB — T4, FREE: Free T4: 0.74 ng/dL (ref 0.60–1.60)

## 2018-01-15 LAB — IRON: Iron: 111 ug/dL (ref 42–145)

## 2018-01-15 LAB — TSH: TSH: 1.61 u[IU]/mL (ref 0.35–4.50)

## 2018-01-15 LAB — VITAMIN B12: VITAMIN B 12: 264 pg/mL (ref 211–911)

## 2018-01-15 MED ORDER — PIMECROLIMUS 1 % EX CREA: TOPICAL_CREAM | Freq: Two times a day (BID) | CUTANEOUS | 0 refills | Status: DC

## 2018-01-15 NOTE — Progress Notes (Signed)
Subjective:    Patient ID: Margaret [name], female    DOB: Nov 17, 1950, 67 y.o.   MRN: [id number]  HPI  Pt in for evaluation.  Pt in states she thinks her thyroid might no be adequatly controlled. She gained 20 lbs, fatigue(mild), some hair loss and area occasional palpitation. Occurs 1-2 times a day. Feels like one second skip and then resolves. Palpitation sensation on and off for 2 weeks. She drinks a lot of caffeine beverages States in past and now she always drinks caffeine beverages all day long. States drinks 3-4 cups coffee, tea and sodas. She does think to counter act sedation side effects of psychiatric meds.  She also mentions that recently started starbucks on top of normal heavy sensation.  Last palpitation yesterday evening.     Review of Systems  Constitutional: Positive for fatigue. Negative for chills and fever.  Respiratory: Negative for chest tightness, shortness of breath and wheezing.   Cardiovascular: Positive for palpitations. Negative for chest pain.       Mild intermittent palpitations. See hpi.  Gastrointestinal: Negative for abdominal pain and blood in stool.  Musculoskeletal: Negative for gait problem.  Skin: Positive for rash.       History of eczema of hands. And ears. She states side effects with oral prednsione and even topical steroids.  Neurological: Negative for dizziness, seizures, weakness and light-headedness.  Hematological: Negative for adenopathy. Does not bruise/bleed easily.  Psychiatric/Behavioral: Negative for behavioral problems, confusion, hallucinations, self-injury and suicidal ideas. The patient is not nervous/anxious.    Past Medical History:  Diagnosis Date  . Anxiety   . Arthritis 02/14/2013  . Bipolar disorder ([place])   . Esophageal reflux 09/19/2012   only slight with gallbladder problems  . Hypoglycemia   . Hypothyroidism   . Other malaise and fatigue 09/19/2012  . PONV (postoperative nausea and vomiting)   . Preventative health  care 06/09/2016  . PTSD (post-traumatic stress disorder)   . Thyroid disease      Social History   Socioeconomic History  . Marital status: Divorced    Spouse name: Not on file  . Number of children: Not on file  . Years of education: Not on file  . Highest education level: Not on file  Occupational History  . Not on file  Social Needs  . Financial resource strain: Not on file  . Food insecurity:    Worry: Not on file    Inability: Not on file  . Transportation needs:    Medical: Not on file    Non-medical: Not on file  Tobacco Use  . Smoking status: Former Smoker    Packs/day: 3.00    Years: 24.00    Pack years: 72.00    Types: Cigarettes    Last attempt to quit: 1995    Years since quitting: 24.9  . Smokeless tobacco: Never Used  Substance and Sexual Activity  . Alcohol use: No    Alcohol/week: 0.0 standard drinks  . Drug use: No  . Sexual activity: Not on file  Lifestyle  . Physical activity:    Days per week: Not on file    Minutes per session: Not on file  . Stress: Not on file  Relationships  . Social connections:    Talks on phone: Not on file    Gets together: Not on file    Attends religious service: Not on file    Active member of club or organization: Not on file    Attends  meetings of clubs or organizations: Not on file    Relationship status: Not on file  . Intimate partner violence:    Fear of current or ex partner: Not on file    Emotionally abused: Not on file    Physically abused: Not on file    Forced sexual activity: Not on file  Other Topics Concern  . Not on file  Social History Narrative  . Not on file    Past Surgical History:  Procedure Laterality Date  . CHOLECYSTECTOMY N/A 09/01/2016   Procedure: LAPAROSCOPIC CHOLECYSTECTOMY WITH INTRAOPERATIVE CHOLANGIOGRAM;  Surgeon: Jovita Kussmaul, MD;  Location: [facility];  Service: General;  Laterality: N/A;  . 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 fooet surgery Left 2014   reconstruction of foot and  ankle to correct deformity  . NECK SURGERY    . OTHER SURGICAL HISTORY    . OTHER SURGICAL HISTORY    . OTHER SURGICAL HISTORY    . TUBAL LIGATION     reversal    Family History  Problem Relation Age of Onset  . Diabetes Mother        Brother 1 of 2  . Heart failure Mother   . Prostate cancer Father   . Parkinsonism Father        deceased  . Hypertension Brother   . Esophageal cancer Maternal Grandmother   . Cancer Paternal Grandmother        cancer of jawbone 1 of 2  . Melanoma Brother   . Breast cancer Neg Hx   . Colon cancer Neg Hx     Allergies  Allergen Reactions  . Novocain [Procaine] Other (See Comments)    Heart race  . Codeine Nausea Only  . Darvon Other (See Comments)    Hallucinations   . Sulfa Drugs Cross Reactors Nausea And Vomiting    Current Outpatient Medications on File Prior to Visit  Medication Sig Dispense Refill  . amitriptyline (ELAVIL) 50 MG tablet Take 1 tablet (50 mg total) by mouth at bedtime. 90 tablet 3  . amoxicillin-clavulanate (AUGMENTIN) 875-125 MG tablet Take 1 tablet by mouth 2 (two) times daily. 20 tablet 0  . carbamazepine (CARBATROL) 200 MG 12 hr capsule Take 1 capsule (200 mg total) by mouth 2 (two) times daily. 180 capsule 3  . Clobetasol Propionate 0.05 % lotion Apply 1 application topically 2 (two) times daily. (Patient taking differently: Apply 1 application topically daily as needed (eczema). ) 118 mL 1  . levothyroxine (SYNTHROID, LEVOTHROID) 112 MCG tablet Take 1 tablet (112 mcg total) by mouth daily before breakfast. 90 tablet 0  . neomycin-polymyxin-hydrocortisone (CORTISPORIN) OTIC solution Place 3 drops into the right ear 4 (four) times daily. 10 mL 0  . ranitidine (ZANTAC) 150 MG capsule Take 1 capsule (150 mg total) by mouth 2 (two) times daily. 60 capsule 0  . risperiDONE (RISPERDAL) 2 MG tablet Take 1 tablet (2 mg total) by mouth at bedtime. 90 tablet 3   No current facility-administered medications on file prior to  visit.     There were no vitals taken for this visit.       Objective:   Physical Exam  General Mental Status- Alert. General Appearance- Not in acute distress.   Skin Mild red rash to both hands. Dry appearance.  Neck Carotid Arteries- Normal color. Moisture- Normal Moisture. No carotid bruits. No JVD.  Chest and Lung Exam Auscultation: Breath Sounds:-Normal.  Cardiovascular Auscultation:Rythm- Regular. Murmurs & Other Heart  Sounds:Auscultation of the heart reveals- No Murmurs.  Abdomen Inspection:-Inspeection Normal. Palpation/Percussion:Note:No mass. Palpation and Percussion of the abdomen reveal- Non Tender, Non Distended + BS, no rebound or guarding.  Neurologic Cranial Nerve exam:- CN III-XII intact(No nystagmus), symmetric smile. Strength:- 5/5 equal and symmetric strength both upper and lower extremities.     Assessment & Plan:  For your history of hypothyroidism and recent fatigue, I did place orders to get CBC, CMP, TSH, T4, B1, B12, vitamin D and iron level.  We will see if any abnormal levels come back.  Make appropriate adjustments if needed.  For history of of palpitations intermittently, I do recommend that you at least cut back significantly on your caffeine intake.  You know that you drink a lot of caffeine to counteract effects of psychiatric medications.  So this might be an issue going forward.  Initially did recommend using a cardiologist but she wanted to see if reduction of caffeine would reduce your palpitations.  She get any palpitation events that are constant and severe/other cardiac type symptoms and recommend ED evaluation.  Your EKG today showed a sinus rhythm with no acute abnormality.  Compared to previous EKG and no significant changes seen.  For eczema I did prescribe topical elidel.  Follow-up date to be determined after lab review.  Mackie Pai, PA-C

## 2018-01-15 NOTE — Patient Instructions (Addendum)
For your history of hypothyroidism and recent fatigue, I did place orders to get CBC, CMP, TSH, T4, B1, B12, vitamin D and iron level.  We will see if any abnormal levels come back.  Make appropriate adjustments if needed.  For history of of palpitations intermittently, I do recommend that you at least cut back significantly on your caffeine intake.  You know that you drink a lot of caffeine to counteract effects of psychiatric medications.  So this might be an issue going forward.  Initially did recommend using a cardiologist but she wanted to see if reduction of caffeine would reduce your palpitations.  She get any palpitation events that are constant and severe/other cardiac type symptoms and recommend ED evaluation.  Your EKG today showed a sinus rhythm with no acute abnormality.  Compared to previous EKG and no significant changes seen.  For eczema I did prescribe topical elidel.  Follow-up date to be determined after lab review.

## 2018-01-19 LAB — VITAMIN B1: Vitamin B1 (Thiamine): 8 nmol/L (ref 8–30)

## 2018-01-19 LAB — VITAMIN D 1,25 DIHYDROXY
Vitamin D 1, 25 (OH)2 Total: 37 pg/mL (ref 18–72)
Vitamin D2 1, 25 (OH)2: <8 pg/mL
Vitamin D3 1, 25 (OH)2: 37 pg/mL

## 2018-01-22 ENCOUNTER — Telehealth: Payer: Self-pay

## 2018-01-22 NOTE — Telephone Encounter (Signed)
Insurance won't cover Pimecrolimus cream-requires a trial of corticosteroids.

## 2018-01-22 NOTE — Telephone Encounter (Signed)
Pt had used clobetasol propianate in the past. This is steroid. If you make them aware and they don't cover elidel will you let patient I can refer her to dermatologist.

## 2018-01-25 NOTE — Telephone Encounter (Signed)
PA initiated via Covermymeds; KEY: [id number]. Awaiting determination.

## 2018-01-27 MED FILL — PIMECROLIMUS 1 % CREA: 1 | 7 days supply | Qty: 30 | Fill #0

## 2018-01-27 NOTE — Telephone Encounter (Signed)
PA approved. Effective 01/26/2018 to 01/26/2019.

## 2018-02-05 MED FILL — CARBAMAZEPINE ER 200 MG CAP: 200 | 30 days supply | Qty: 60 | Fill #3

## 2018-02-11 ENCOUNTER — Telehealth (HOSPITAL_COMMUNITY): Payer: Self-pay

## 2018-02-11 NOTE — Telephone Encounter (Signed)
Called patient and lvm asking her to return my call asap

## 2018-02-11 NOTE — Telephone Encounter (Signed)
I called patient back and left a voicemail letting her know what you said. Patient has an appointment with you on 1/15. I called the pharmacy and the manufacturer is being discontinued so patient can not get the medication made by the previous manufacturer, should she stop taking all together until she comes in?

## 2018-02-11 NOTE — Telephone Encounter (Signed)
Patient called, she said that the pharmacy changed manufacturers on her Carbamazepine and she took the first one this morning. Patient states she got to work and she is experiencing panic, heart racing, lips numb, mind racing, "worst panic attack ever". Patient can not take benadryl and states that other than the anxiety there is no problems with breathing. Patient is going to stay at work around people until I call her back, she does not trust herself to drive or be alone right now. Please review and advise, thank you

## 2018-02-11 NOTE — Telephone Encounter (Signed)
We can try brand-name Tegretol.

## 2018-02-11 NOTE — Telephone Encounter (Signed)
I have not seen her since May 2017.  She has been seen Dr. Daron Offer back in April 2019.  There were no medication change.  She may need to consider changing her pharmacy if she believe current pharmacy change the manufactures for her carbamazepine.  She may need to ask coworker to drive her home.  If symptoms continue to persist then need to go to the emergency room.  Do not take same carbamazepine until seen by physician.

## 2018-02-24 ENCOUNTER — Ambulatory Visit (HOSPITAL_COMMUNITY): Payer: No Typology Code available for payment source | Admitting: Psychiatry

## 2018-03-01 MED FILL — risperiDONE 2 MG TABS: 2 | 90 days supply | Qty: 90 | Fill #3

## 2018-03-01 MED FILL — CARBAMAZEPINE ER 200 MG CAP: 200 | 30 days supply | Qty: 60 | Fill #4

## 2018-03-01 MED FILL — AMITRIPTYLINE HCL 50 MG TAB: 50 | 90 days supply | Qty: 90 | Fill #3

## 2018-03-04 ENCOUNTER — Ambulatory Visit (INDEPENDENT_AMBULATORY_CARE_PROVIDER_SITE_OTHER): Payer: No Typology Code available for payment source | Admitting: Psychiatry

## 2018-03-04 ENCOUNTER — Encounter (HOSPITAL_COMMUNITY): Payer: Self-pay | Admitting: Psychiatry

## 2018-03-04 VITALS — BP 130/74 | Ht 67.0 in | Wt 202.0 lb

## 2018-03-04 DIAGNOSIS — F319 Bipolar disorder, unspecified: Secondary | ICD-10-CM

## 2018-03-04 DIAGNOSIS — F419 Anxiety disorder, unspecified: Secondary | ICD-10-CM | POA: Diagnosis not present

## 2018-03-04 DIAGNOSIS — Z79899 Other long term (current) drug therapy: Secondary | ICD-10-CM

## 2018-03-04 MED ORDER — CARBAMAZEPINE ER 200 MG PO CP12: 200.0000 mg | ORAL_CAPSULE | Freq: Two times a day (BID) | ORAL | 1 refills | Status: DC

## 2018-03-04 MED ORDER — RISPERIDONE 2 MG PO TABS: 2.0000 mg | ORAL_TABLET | Freq: Every day | ORAL | 1 refills | Status: DC

## 2018-03-04 MED ORDER — AMITRIPTYLINE HCL 50 MG PO TABS: 50.0000 mg | ORAL_TABLET | Freq: Every day | ORAL | 1 refills | Status: DC

## 2018-03-04 NOTE — Progress Notes (Signed)
[facility] MD/PA/NP OP Progress Note  03/04/2018 2:38 PM Margaret [name]  MRN:  [id number]  Chief Complaint: I have not seen you in a while.  I am taking her medication.  It is working fine.  HPI: Margaret [name] is a 68 year old Caucasian, employed female who has establish patient in this office for a long time.  She was last seen by this writer in 2017 and after that she was seen by Dr. Modesta Messing and lately she was seeing Dr. Daron Offer who left the practice.  She is been stable on carbamazepine, Risperdal and amitriptyline.  Patient called office few weeks ago believed to be reaction with the carbamazepine.  She thought that manufacture changed by the pharmacy and the new medicine did not agree with her.  However after few days she felt better.  We have recommended to change to brand name but she never called [place] back to switch the medication.  She feel the current medication is doing her job.  She is sleeping good.  She feels sometimes anxious and nervous because of the job.  She works at [employer].  Sometime her job is overwhelmed.  Overall she describes her mood is a stable.  She denies any irritability, anger, mania or any psychosis.  She is sleeping better.  She started watching her diet but struggle to lose weight.  She lives with her dog.  Her brother lives in [place] and she had a good time with him on the Christmas.  Patient denies any paranoia, hallucination, suicidal thoughts.  She has no tremors, shakes or any EPS.  Recently she had a blood work which is essentially normal but she did not have hemoglobin A1c and carbamazepine level.  She feels that she is doing best on her current medication and she has no desire to change or reduce the dose.  Her energy level is good.  She like to continue work to keep herself busy.  Patient denies drinking or using any illegal substances.  Visit Diagnosis:    ICD-10-CM   1. Anxiety F41.9 amitriptyline (ELAVIL) 50 MG tablet  2. Bipolar 1 disorder (HCC)  F31.9 carbamazepine (CARBATROL) 200 MG 12 hr capsule    risperiDONE (RISPERDAL) 2 MG tablet    amitriptyline (ELAVIL) 50 MG tablet    DISCONTINUED: amitriptyline (ELAVIL) 50 MG tablet    Past Psychiatric History: Reviewed. History of inpatient in 2009.  No history of suicidal attempt.  Tried lithium which worked very well but discontinued due to high creatinine.  Past Medical History:  Past Medical History:  Diagnosis Date  . Anxiety   . Arthritis 02/14/2013  . Bipolar disorder ([place])   . Esophageal reflux 09/19/2012   only slight with gallbladder problems  . Hypoglycemia   . Hypothyroidism   . Other malaise and fatigue 09/19/2012  . PONV (postoperative nausea and vomiting)   . Preventative health care 06/09/2016  . PTSD (post-traumatic stress disorder)   . Thyroid disease     Past Surgical History:  Procedure Laterality Date  . CHOLECYSTECTOMY N/A 09/01/2016   Procedure: LAPAROSCOPIC CHOLECYSTECTOMY WITH INTRAOPERATIVE CHOLANGIOGRAM;  Surgeon: Jovita Kussmaul, MD;  Location: [facility];  Service: General;  Laterality: N/A;  . 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 fooet surgery Left 2014   reconstruction of foot and ankle to correct deformity  . NECK SURGERY    . OTHER SURGICAL HISTORY    . OTHER SURGICAL HISTORY    . OTHER SURGICAL HISTORY    .  TUBAL LIGATION     reversal    Family Psychiatric History: Reviewed.  Family History:  Family History  Problem Relation Age of Onset  . Diabetes Mother        Brother 1 of 2  . Heart failure Mother   . Prostate cancer Father   . Parkinsonism Father        deceased  . Hypertension Brother   . Esophageal cancer Maternal Grandmother   . Cancer Paternal Grandmother        cancer of jawbone 1 of 2  . Melanoma Brother   . Breast cancer Neg Hx   . Colon cancer Neg Hx     Social History:  Social History   Socioeconomic History  . Marital status: Divorced    Spouse name: Not on file  . Number of children: Not on file  . Years of education:  Not on file  . Highest education level: Not on file  Occupational History  . Not on file  Social Needs  . Financial resource strain: Not on file  . Food insecurity:    Worry: Not on file    Inability: Not on file  . Transportation needs:    Medical: Not on file    Non-medical: Not on file  Tobacco Use  . Smoking status: Former Smoker    Packs/day: 3.00    Years: 24.00    Pack years: 72.00    Types: Cigarettes    Last attempt to quit: 1995    Years since quitting: 25.0  . Smokeless tobacco: Never Used  Substance and Sexual Activity  . Alcohol use: No    Alcohol/week: 0.0 standard drinks  . Drug use: No  . Sexual activity: Not on file  Lifestyle  . Physical activity:    Days per week: Not on file    Minutes per session: Not on file  . Stress: Not on file  Relationships  . Social connections:    Talks on phone: Not on file    Gets together: Not on file    Attends religious service: Not on file    Active member of club or organization: Not on file    Attends meetings of clubs or organizations: Not on file    Relationship status: Not on file  Other Topics Concern  . Not on file  Social History Narrative  . Not on file    Allergies:  Allergies  Allergen Reactions  . Novocain [Procaine] Other (See Comments)    Heart race  . Codeine Nausea Only  . Darvon Other (See Comments)    Hallucinations   . Sulfa Drugs Cross Reactors Nausea And Vomiting    Metabolic Disorder Labs: Recent Results (from the past 2160 hour(s))  CBC w/Diff     Status: Abnormal   Collection Time: 01/15/18  8:56 AM  Result Value Ref Range   WBC 4.9 4.0 - 10.5 K/uL   RBC 4.55 3.87 - 5.11 Mil/uL   Hemoglobin 15.1 (H) 12.0 - 15.0 g/dL   HCT 44.0 36.0 - 46.0 %   MCV 96.6 78.0 - 100.0 fl   MCHC 34.2 30.0 - 36.0 g/dL   RDW 12.9 11.5 - 15.5 %   Platelets 212.0 150.0 - 400.0 K/uL   Neutrophils Relative % 62.8 43.0 - 77.0 %   Lymphocytes Relative 22.5 12.0 - 46.0 %   Monocytes Relative 10.2 3.0 -  12.0 %   Eosinophils Relative 4.1 0.0 - 5.0 %   Basophils Relative 0.4  0.0 - 3.0 %   Neutro Abs 3.1 1.4 - 7.7 K/uL   Lymphs Abs 1.1 0.7 - 4.0 K/uL   Monocytes Absolute 0.5 0.1 - 1.0 K/uL   Eosinophils Absolute 0.2 0.0 - 0.7 K/uL   Basophils Absolute 0.0 0.0 - 0.1 K/uL  Comp Met (CMET)     Status: None   Collection Time: 01/15/18  8:56 AM  Result Value Ref Range   Sodium 142 135 - 145 mEq/L   Potassium 4.4 3.5 - 5.1 mEq/L   Chloride 107 96 - 112 mEq/L   CO2 27 19 - 32 mEq/L   Glucose, Bld 95 70 - 99 mg/dL   BUN 20 6 - 23 mg/dL   Creatinine, Ser 0.95 0.40 - 1.20 mg/dL   Total Bilirubin 0.3 0.2 - 1.2 mg/dL   Alkaline Phosphatase 76 39 - 117 U/L   AST 11 0 - 37 U/L   ALT 10 0 - 35 U/L   Total Protein 6.4 6.0 - 8.3 g/dL   Albumin 4.3 3.5 - 5.2 g/dL   Calcium 9.4 8.4 - 10.5 mg/dL   GFR 62.30 >60.00 mL/min  TSH     Status: None   Collection Time: 01/15/18  8:56 AM  Result Value Ref Range   TSH 1.61 0.35 - 4.50 uIU/mL  Vitamin B1     Status: None   Collection Time: 01/15/18  8:56 AM  Result Value Ref Range   Vitamin B1 (Thiamine) 8 8 - 30 nmol/L    Comment: Marland Kitchen Vitamin supplementation within 24 hours prior to blood draw may affect the accuracy of the results. . This test was developed and its analytical performance characteristics have been determined by [place], [place]. It has not been cleared or approved by the U.S. Food and Drug Administration. This assay has been validated pursuant to the CLIA regulations and is used for clinical purposes. .   B12     Status: None   Collection Time: 01/15/18  8:56 AM  Result Value Ref Range   Vitamin B-12 264 211 - 911 pg/mL  Vitamin D 1,25 dihydroxy     Status: None   Collection Time: 01/15/18  8:56 AM  Result Value Ref Range   Vitamin D 1, 25 (OH)2 Total 37 18 - 72 pg/mL   Vitamin D3 1, 25 (OH)2 37 pg/mL   Vitamin D2 1, 25 (OH)2 <8 pg/mL    Comment: Marland Kitchen Vitamin D3, 1,25(OH)2 indicates both  endogenous production and supplementation. Vitamin D2, 1,25(OH)2 is an indicator of exogenous sources, such as diet or supplementation.  Interpretation and therapy are based on measurement of Vitamin D,1,25(OH)2, Total. . . This test was developed and its analytical performance characteristics have been determined by [facility], [place], [place]. It has not been cleared or approved by the FDA. This assay has been validated pursuant to the CLIA regulations and is used for clinical purposes. .   T4, free     Status: None   Collection Time: 01/15/18  8:56 AM  Result Value Ref Range   Free T4 0.74 0.60 - 1.60 ng/dL    Comment: Specimens from patients who are undergoing biotin therapy and /or ingesting biotin supplements may contain high levels of biotin.  The higher biotin concentration in these specimens interferes with this Free T4 assay.  Specimens that contain high levels  of biotin may cause false high results for this Free T4 assay.  Please interpret results in light of the total  clinical presentation of the patient.    Iron     Status: None   Collection Time: 01/15/18  8:56 AM  Result Value Ref Range   Iron 111 42 - 145 ug/dL   Lab Results  Component Value Date   HGBA1C 5.7 03/16/2017   MPG 114 08/28/2014   MPG 131 (H) 06/20/2013   No results found for: PROLACTIN Lab Results  Component Value Date   CHOL 219 (H) 03/16/2017   TRIG 185.0 (H) 03/16/2017   HDL 63.70 03/16/2017   CHOLHDL 3 03/16/2017   VLDL 37.0 03/16/2017   LDLCALC 118 (H) 03/16/2017   LDLCALC 140 (H) 03/17/2016   Lab Results  Component Value Date   TSH 1.61 01/15/2018   TSH 1.66 03/16/2017    Therapeutic Level Labs: Lab Results  Component Value Date   LITHIUM 1.00 07/28/2013   LITHIUM 0.90 06/07/2012   No results found for: VALPROATE No components found for:  CBMZ  Current Medications: Current Outpatient Medications  Medication Sig Dispense Refill  . amitriptyline  (ELAVIL) 50 MG tablet Take 1 tablet (50 mg total) by mouth at bedtime. 90 tablet 3  . amoxicillin-clavulanate (AUGMENTIN) 875-125 MG tablet Take 1 tablet by mouth 2 (two) times daily. 20 tablet 0  . carbamazepine (CARBATROL) 200 MG 12 hr capsule Take 1 capsule (200 mg total) by mouth 2 (two) times daily. 180 capsule 3  . Clobetasol Propionate 0.05 % lotion Apply 1 application topically 2 (two) times daily. (Patient taking differently: Apply 1 application topically daily as needed (eczema). ) 118 mL 1  . levothyroxine (SYNTHROID, LEVOTHROID) 112 MCG tablet Take 1 tablet (112 mcg total) by mouth daily before breakfast. 90 tablet 0  . neomycin-polymyxin-hydrocortisone (CORTISPORIN) OTIC solution Place 3 drops into the right ear 4 (four) times daily. 10 mL 0  . pimecrolimus (ELIDEL) 1 % cream Apply topically 2 (two) times daily. 30 g 0  . ranitidine (ZANTAC) 150 MG capsule Take 1 capsule (150 mg total) by mouth 2 (two) times daily. 60 capsule 0  . risperiDONE (RISPERDAL) 2 MG tablet Take 1 tablet (2 mg total) by mouth at bedtime. 90 tablet 3   No current facility-administered medications for this visit.      Musculoskeletal: Strength & Muscle Tone: within normal limits Gait & Station: normal Patient leans: N/A  Psychiatric Specialty Exam: Review of Systems  Psychiatric/Behavioral: The patient is nervous/anxious.     Blood pressure 130/74, height '5\' 7"'  (1.702 m), weight 202 lb (91.6 kg).There is no height or weight on file to calculate BMI.  General Appearance: Well Groomed  Eye Contact:  Good  Speech:  Fast but coherent.  Volume:  Increased  Mood:  Anxious  Affect:  Congruent  Thought Process:  Descriptions of Associations: Intact  Orientation:  Full (Time, Place, and Person)  Thought Content: Logical   Suicidal Thoughts:  No  Homicidal Thoughts:  No  Memory:  Immediate;   Fair Recent;   Good Remote;   Good  Judgement:  Good  Insight:  Good  Psychomotor Activity:  Normal   Concentration:  Concentration: Fair and Attention Span: Fair  Recall:  Good  Fund of Knowledge: Good  Language: Good  Akathisia:  No  Handed:  Right  AIMS (if indicated): not done  Assets:  Communication Skills Desire for Improvement Housing Resilience Talents/Skills Transportation  ADL's:  Intact  Cognition: WNL  Sleep:  Good   Screenings: PHQ2-9     Office Visit from 06/09/2016 in [facility] [place] Visit from 02/01/2015 in [facility] at [facility] Total Score  0  1       Assessment and Plan: Bipolar disorder type I.  Anxiety.  I reviewed previous records, psychosocial stressors, current medication and blood work results.  She is stable on her current medication.  She feels some time overwhelmed and anxious due to her job but she wants to continue working to keep herself busy.  She has no tremors, shakes or any EPS.  She does not want to reduce her medication.  We discussed antipsychotic because metabolic syndrome but so far she is tolerating all her medication.  We will do hemoglobin A1c and Tegretol level today.  She is not interested in therapy.  I will continue amitriptyline 50 mg at bedtime, carbamazepine 200 mg twice a day and Risperdal 2 mg at bedtime.  Discussed medication side effects and benefits.  Recommended to call us back if she has any question or any concern.  Encourage healthy lifestyle and watch her calorie intake.  Follow-up in 6 months. Time spent 30 minutes.  More than 50% of the time spent in psychoeducation, counseling and coordination of care.  Discuss safety plan that anytime having active suicidal thoughts or homicidal thoughts then patient need to call 911 or go to the local emergency room.     [name], MD 03/04/2018, 2:38 PM

## 2018-03-05 LAB — CARBAMAZEPINE LEVEL, TOTAL: Carbamazepine (Tegretol), S: 6 ug/mL (ref 4.0–12.0)

## 2018-03-05 LAB — HEMOGLOBIN A1C
ESTIMATED AVERAGE GLUCOSE: 120 mg/dL
Hgb A1c MFr Bld: 5.8 % — ABNORMAL HIGH (ref 4.8–5.6)

## 2018-03-29 ENCOUNTER — Other Ambulatory Visit: Payer: Self-pay | Admitting: Family Medicine

## 2018-03-30 MED FILL — LEVOTHYROXINE 112 MCG TAB: 112 | 90 days supply | Qty: 90 | Fill #0

## 2018-04-02 ENCOUNTER — Telehealth: Payer: Self-pay

## 2018-04-02 NOTE — Telephone Encounter (Signed)
Copied from [place] [id number]. Topic: Appointment Scheduling - Scheduling Inquiry for Clinic >> Apr 02, 2018 11:06 AM Antonieta Iba C wrote: Reason for CRM: pt called in to schedule her CPE. 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 Pt says that she can only come in on every 4th Friday of the month. Pt would like to know if provider is able to see her sooner then July on one of her scheduled days? (work in).

## 2018-04-07 MED FILL — CARBATROL 200 MG CAPSULE SA: 200 | 90 days supply | Qty: 180 | Fill #0

## 2018-04-21 ENCOUNTER — Encounter: Payer: Self-pay | Admitting: Medical

## 2018-04-21 ENCOUNTER — Ambulatory Visit (INDEPENDENT_AMBULATORY_CARE_PROVIDER_SITE_OTHER): Payer: No Typology Code available for payment source | Admitting: Medical

## 2018-04-21 ENCOUNTER — Other Ambulatory Visit: Payer: Self-pay

## 2018-04-21 VITALS — BP 151/82 | HR 86 | Temp 97.8°F | Resp 12 | Ht 67.0 in | Wt 204.0 lb

## 2018-04-21 DIAGNOSIS — R197 Diarrhea, unspecified: Secondary | ICD-10-CM

## 2018-04-21 DIAGNOSIS — M549 Dorsalgia, unspecified: Secondary | ICD-10-CM

## 2018-04-21 DIAGNOSIS — R5383 Other fatigue: Secondary | ICD-10-CM | POA: Diagnosis not present

## 2018-04-21 DIAGNOSIS — R300 Dysuria: Secondary | ICD-10-CM | POA: Diagnosis not present

## 2018-04-21 LAB — COMPREHENSIVE METABOLIC PANEL
ALT: 13 U/L (ref 0–35)
AST: 13 U/L (ref 0–37)
Albumin: 4.5 g/dL (ref 3.5–5.2)
Alkaline Phosphatase: 89 U/L (ref 39–117)
BUN: 20 mg/dL (ref 6–23)
CO2: 26 mEq/L (ref 19–32)
Calcium: 9.7 mg/dL (ref 8.4–10.5)
Chloride: 106 mEq/L (ref 96–112)
Creatinine, Ser: 0.95 mg/dL (ref 0.40–1.20)
GFR: 58.57 mL/min — ABNORMAL LOW (ref >60.00)
GLUCOSE: 99 mg/dL (ref 70–99)
Potassium: 4.7 mEq/L (ref 3.5–5.1)
SODIUM: 141 meq/L (ref 135–145)
Total Bilirubin: 0.4 mg/dL (ref 0.2–1.2)
Total Protein: 6.6 g/dL (ref 6.0–8.3)

## 2018-04-21 LAB — CBC WITH DIFFERENTIAL/PLATELET
Basophils Absolute: 0 10*3/uL (ref 0.0–0.1)
Basophils Relative: 0.6 % (ref 0.0–3.0)
Eosinophils Absolute: 0.2 10*3/uL (ref 0.0–0.7)
Eosinophils Relative: 5.2 % — ABNORMAL HIGH (ref 0.0–5.0)
HCT: 43.3 % (ref 36.0–46.0)
Hemoglobin: 14.7 g/dL (ref 12.0–15.0)
Lymphocytes Relative: 26.1 % (ref 12.0–46.0)
Lymphs Abs: 1.2 10*3/uL (ref 0.7–4.0)
MCHC: 34.1 g/dL (ref 30.0–36.0)
MCV: 97 fl (ref 78.0–100.0)
Monocytes Absolute: 0.5 10*3/uL (ref 0.1–1.0)
Monocytes Relative: 10.6 % (ref 3.0–12.0)
NEUTROS ABS: 2.6 10*3/uL (ref 1.4–7.7)
Neutrophils Relative %: 57.5 % (ref 43.0–77.0)
Platelets: 218 10*3/uL (ref 150.0–400.0)
RBC: 4.46 Mil/uL (ref 3.87–5.11)
RDW: 13.1 % (ref 11.5–15.5)
WBC: 4.6 10*3/uL (ref 4.0–10.5)

## 2018-04-21 LAB — POCT URINALYSIS DIPSTICK
Bilirubin, UA: NEGATIVE
Blood, UA: NEGATIVE
Glucose, UA: NEGATIVE
Ketones, UA: NEGATIVE
Leukocytes, UA: NEGATIVE
Nitrite, UA: NEGATIVE
Protein, UA: NEGATIVE
Spec Grav, UA: 1.01 (ref 1.010–1.025)
Urobilinogen, UA: 0.2 E.U./dL
pH, UA: 6 (ref 5.0–8.0)

## 2018-04-21 LAB — TSH: TSH: 2.65 u[IU]/mL (ref 0.35–4.50)

## 2018-04-21 MED ORDER — CIPROFLOXACIN HCL 250 MG PO TABS: 250.0000 mg | ORAL_TABLET | Freq: Two times a day (BID) | ORAL | 0 refills | Status: DC

## 2018-04-21 MED FILL — CIPROFLOXACIN HCL 250 MG TA: 250 | 3 days supply | Qty: 6 | Fill #0

## 2018-04-21 NOTE — Progress Notes (Signed)
Subjective:    Patient ID: [name], female    DOB: [birth year], 68 y.o.   MRN: [id number]  HPI  Pt in today reporting urinary symptoms for 2 days.  Dysuria- yes when urinated. She state will have burning in back when she urinates. Frequent urination-yes Hesitancy-no Suprapubic pressure-no Fever-no  chills-no Nausea-no Vomiting-no CVA pain-Some rt side cva area but some all the way across back.(in exam room she did complain onset of left side kidney area pain mild. 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 Gross hematuria-no  Feels fatigued for about 1 week.   Review of Systems  Constitutional: Negative for chills, fatigue and fever.  HENT: Negative for congestion and ear pain.   Respiratory: Negative for cough, chest tightness, shortness of breath and wheezing.   Cardiovascular: Negative for chest pain and palpitations.  Gastrointestinal: Negative for abdominal distention, abdominal pain, nausea, rectal pain and vomiting.       Loose stool twice today.  Musculoskeletal: Positive for back pain.  Skin: Negative for rash.  Neurological: Negative for dizziness, tremors, facial asymmetry, speech difficulty, weakness and light-headedness.  Hematological: Negative for adenopathy. Does not bruise/bleed easily.  Psychiatric/Behavioral: Negative for behavioral problems and confusion. The patient is not nervous/anxious.     Past Medical History:  Diagnosis Date  . Anxiety   . Arthritis 02/14/2013  . Bipolar disorder ([place])   . Esophageal reflux 09/19/2012   only slight with gallbladder problems  . Hypoglycemia   . Hypothyroidism   . Other malaise and fatigue 09/19/2012  . PONV (postoperative nausea and vomiting)   . Preventative health care 06/09/2016  . PTSD (post-traumatic stress disorder)   . Thyroid disease      Social History   Socioeconomic History  . Marital status: Divorced   Spouse name: Not on file  . Number of children: Not on file  . Years of education: Not on file  . Highest education level: Not on file  Occupational History  . Not on file  Social Needs  . Financial resource strain: Not on file  . Food insecurity:    Worry: Not on file    Inability: Not on file  . Transportation needs:    Medical: Not on file    Non-medical: Not on file  Tobacco Use  . Smoking status: Former Smoker    Packs/day: 3.00    Years: 24.00    Pack years: 72.00    Types: Cigarettes    Last attempt to quit: 1995    Years since quitting: 25.2  . Smokeless tobacco: Never Used  Substance and Sexual Activity  . Alcohol use: No    Alcohol/week: 0.0 standard drinks  . Drug use: No  . Sexual activity: Not on file  Lifestyle  . Physical activity:    Days per week: Not on file    Minutes per session: Not on file  . Stress: Not on file  Relationships  . Social connections:    Talks on phone: Not on file    Gets together: Not on file    Attends religious service: Not on file    Active member of club or organization: Not on file    Attends meetings of clubs or organizations: Not on file    Relationship status: Not on file  . Intimate partner violence:    Fear of current or ex partner: Not on file  Emotionally abused: Not on file    Physically abused: Not on file    Forced sexual activity: Not on file  Other Topics Concern  . Not on file  Social History Narrative  . Not on file    Past Surgical History:  Procedure Laterality Date  . CHOLECYSTECTOMY N/A 09/01/2016   Procedure: LAPAROSCOPIC CHOLECYSTECTOMY WITH INTRAOPERATIVE CHOLANGIOGRAM;  Surgeon: Jovita Kussmaul, MD;  Location: [facility];  Service: General;  Laterality: N/A;  . 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 fooet surgery Left 2014   reconstruction of foot and ankle to correct deformity  . NECK SURGERY    . OTHER SURGICAL HISTORY    . OTHER SURGICAL HISTORY    . OTHER SURGICAL HISTORY    . TUBAL LIGATION      reversal    Family History  Problem Relation Age of Onset  . Diabetes Mother        Brother 1 of 2  . Heart failure Mother   . Prostate cancer Father   . Parkinsonism Father        deceased  . Hypertension Brother   . Esophageal cancer Maternal Grandmother   . Cancer Paternal Grandmother        cancer of jawbone 1 of 2  . Melanoma Brother   . Breast cancer Neg Hx   . Colon cancer Neg Hx     Allergies  Allergen Reactions  . Novocain [Procaine] Other (See Comments)    Heart race  . Codeine Nausea Only  . Darvon Other (See Comments)    Hallucinations   . Sulfa Drugs Cross Reactors Nausea And Vomiting    Current Outpatient Medications on File Prior to Visit  Medication Sig Dispense Refill  . amitriptyline (ELAVIL) 50 MG tablet Take 1 tablet (50 mg total) by mouth at bedtime. 90 tablet 1  . carbamazepine (CARBATROL) 200 MG 12 hr capsule Take 1 capsule (200 mg total) by mouth 2 (two) times daily. 180 capsule 1  . Clobetasol Propionate 0.05 % lotion Apply 1 application topically 2 (two) times daily. (Patient taking differently: Apply 1 application topically daily as needed (eczema). ) 118 mL 1  . levothyroxine (SYNTHROID, LEVOTHROID) 112 MCG tablet TAKE 1 TABLET (112 MCG TOTAL) BY MOUTH DAILY BEFORE BREAKFAST. 90 tablet 0  . neomycin-polymyxin-hydrocortisone (CORTISPORIN) OTIC solution Place 3 drops into the right ear 4 (four) times daily. 10 mL 0  . pimecrolimus (ELIDEL) 1 % cream Apply topically 2 (two) times daily. 30 g 0  . ranitidine (ZANTAC) 150 MG capsule Take 1 capsule (150 mg total) by mouth 2 (two) times daily. 60 capsule 0  . risperiDONE (RISPERDAL) 2 MG tablet Take 1 tablet (2 mg total) by mouth at bedtime. 90 tablet 1  . amoxicillin-clavulanate (AUGMENTIN) 875-125 MG tablet Take 1 tablet by mouth 2 (two) times daily. (Patient not taking: Reported on 04/21/2018) 20 tablet 0   No current facility-administered medications on file prior to visit.     BP (!) 151/82 (BP  Location: Left Arm, Patient Position: Sitting, Cuff Size: Normal)   Pulse 86   Temp 97.8 F (36.6 C)   Resp 12   Ht 5\' 7"  (1.702 m)   Wt 204 lb (92.5 kg)   SpO2 98%   BMI 31.95 kg/m       Objective:   Physical Exam  General Appearance- Not in acute distress.  HEENT Eyes- Scleraeral/Conjuntiva-bilat- Not Yellow. Mouth & Throat- Normal.  Chest and Lung Exam Auscultation:  Breath sounds:-Normal. Adventitious sounds:- No Adventitious sounds.  Cardiovascular Auscultation:Rythm - Regular. Heart Sounds -Normal heart sounds.  Abdomen Inspection:-Inspection Normal.  Palpation/Perucssion: Palpation and Percussion of the abdomen reveal- Non Tender, No Rebound tenderness, No rigidity(Guarding) and No Palpable abdominal masses.  Liver:-Normal.  Spleen:- Normal.   Back- faint bilateral cva tenderness.      Assessment & Plan:  You do have recent dysuria with some intermittent costovertebral angle/kidney region pain.  History of UTIs in the past as well.  Your urine looks clear but your symptoms are concerning for possible UTI.  We will go ahead and send your urine out for culture.  During the interim we will prescribe Cipro low dose antibiotic for 3 days.  Recommend hydrating well.  Can use Azo-Standard over-the-counter for urinary pain.  You do report some fatigue recently.  This could be related to UTI.  Since you are initial urine study look clear will also add labs today.  Will get CBC, CMP, B1 and TSH.  Your B1 was low on recent work-up for fatigue but you did not take over-the-counter supplement.  Follow-up date to be determined depending on how you do clinically and depending on lab results.  Did discuss conservative measure for loose stools and if diarrhea worsen do gastro panel and turn in by Friday.   Mackie Pai, PA-C

## 2018-04-21 NOTE — Patient Instructions (Signed)
You do have recent dysuria with some intermittent costovertebral angle/kidney region pain.  History of UTIs in the past as well.  Your urine looks clear but your symptoms are concerning for possible UTI.  We will go ahead and send your urine out for culture.  During the interim we will prescribe Cipro low dose antibiotic for 3 days.  Recommend hydrating well.  Can use Azo-Standard over-the-counter for urinary pain.  You do report some fatigue recently.  This could be related to UTI.  Since you are initial urine study look clear will also add labs today.  Will get CBC, CMP, B1 and TSH.  Your B1 was low on recent work-up for fatigue but you did not take over-the-counter supplement.  Follow-up date to be determined depending on how you do clinically and depending on lab results.

## 2018-04-22 LAB — URINE CULTURE
MICRO NUMBER:: 305635
Result:: NO GROWTH
SPECIMEN QUALITY:: ADEQUATE

## 2018-04-25 LAB — VITAMIN B1: VITAMIN B1 (THIAMINE): 7 nmol/L — AB (ref 8–30)

## 2018-05-18 ENCOUNTER — Ambulatory Visit (HOSPITAL_COMMUNITY): Payer: Self-pay | Admitting: Psychiatry

## 2018-06-01 ENCOUNTER — Other Ambulatory Visit: Payer: Self-pay | Admitting: Family Medicine

## 2018-06-01 MED FILL — AMITRIPTYLINE HCL 50 MG TAB: 50 | 90 days supply | Qty: 90 | Fill #0

## 2018-06-01 MED FILL — risperiDONE 2 MG TABS: 2 | 90 days supply | Qty: 90 | Fill #0

## 2018-06-04 ENCOUNTER — Encounter: Payer: Self-pay | Admitting: Family Medicine

## 2018-06-25 MED FILL — LEVOTHYROXINE 112 MCG TAB: 112 | 90 days supply | Qty: 90 | Fill #0

## 2018-06-28 MED FILL — CARBATROL 200 MG CAPSULE SA: 200 | 90 days supply | Qty: 180 | Fill #1

## 2018-08-25 MED FILL — AMITRIPTYLINE HCL 50 MG TAB: 50 | 90 days supply | Qty: 90 | Fill #1

## 2018-08-25 MED FILL — risperiDONE 2 MG TABS: 2 | 90 days supply | Qty: 90 | Fill #1

## 2018-09-02 ENCOUNTER — Ambulatory Visit (HOSPITAL_COMMUNITY): Payer: No Typology Code available for payment source | Admitting: Psychiatry

## 2018-09-20 ENCOUNTER — Encounter (HOSPITAL_COMMUNITY): Payer: Self-pay | Admitting: Psychiatry

## 2018-09-20 ENCOUNTER — Ambulatory Visit (INDEPENDENT_AMBULATORY_CARE_PROVIDER_SITE_OTHER): Payer: No Typology Code available for payment source | Admitting: Psychiatry

## 2018-09-20 ENCOUNTER — Other Ambulatory Visit: Payer: Self-pay

## 2018-09-20 DIAGNOSIS — F319 Bipolar disorder, unspecified: Secondary | ICD-10-CM

## 2018-09-20 DIAGNOSIS — F419 Anxiety disorder, unspecified: Secondary | ICD-10-CM | POA: Diagnosis not present

## 2018-09-20 MED ORDER — CARBAMAZEPINE ER 200 MG PO CP12: 200.0000 mg | ORAL_CAPSULE | Freq: Two times a day (BID) | ORAL | 1 refills | Status: DC

## 2018-09-20 MED ORDER — AMITRIPTYLINE HCL 50 MG PO TABS: 50.0000 mg | ORAL_TABLET | Freq: Every day | ORAL | 1 refills | Status: DC

## 2018-09-20 MED ORDER — RISPERIDONE 2 MG PO TABS: 2.0000 mg | ORAL_TABLET | Freq: Every day | ORAL | 1 refills | Status: DC

## 2018-09-20 MED FILL — CARBATROL 200 MG CAPSULE SA: 200 | 90 days supply | Qty: 180 | Fill #0

## 2018-09-20 NOTE — Progress Notes (Signed)
Virtual Visit via Telephone Note  I connected with [name] on 09/20/18 at  8:20 AM EDT by telephone and verified that I am speaking with the correct person using two identifiers.   I discussed the limitations, risks, security and privacy concerns of performing an evaluation and management service by telephone and the availability of in person appointments. I also discussed with the patient that there may be a patient responsible charge related to this service. The patient expressed understanding and agreed to proceed.   History of Present Illness: Patient was evaluated through phone session.  She is compliant with medication and reported no issues.  Her mood is stable.  She denies any irritability, anger, mania or any psychosis.  She works every other week and she is pleased that her job is going.  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  She likes her job.  Her mother lives in [name] and sometimes she visit him.  She lives by herself with the dogs.  She has no rash, itching tremors or shakes.  Recently she had a blood work which is normal other than low vitamin D.  She is now taking supplemental vitamin D.  Her hemoglobin A1c is 5.8 which is stable.  Patient like to keep her medication which is helping her.  She admitted some anxiety to the COVID-19 but does not feel it is getting worse since taking the medication amitriptyline.  She reported her appetite is okay and do not recall any weight gain in recent months.   Past Psychiatric History: Reviewed. History of inpatient in 2009.  No history of suicidal attempt.  Tried lithium which worked very well but discontinued due to high creatinine.   Psychiatric Specialty Exam: Physical Exam  ROS  There were no vitals taken for this visit.There is no height or weight on file to calculate BMI.  General Appearance: NA  Eye Contact:  NA  Speech:  Clear and Coherent and fast  Volume:  Increased  Mood:  pleasent  Affect:  NA  Thought Process:   Descriptions of Associations: Intact  Orientation:  Full (Time, Place, and Person)  Thought Content:  WDL  Suicidal Thoughts:  No  Homicidal Thoughts:  No  Memory:  Immediate;   Good Recent;   Good Remote;   Good  Judgement:  Good  Insight:  Good  Psychomotor Activity:  NA  Concentration:  Concentration: Fair and Attention Span: Fair  Recall:  Good  Fund of Knowledge:  Good  Language:  Good  Akathisia:  No  Handed:  Right  AIMS (if indicated):     Assets:  Communication Skills Desire for Improvement Financial Resources/Insurance Housing Resilience Social Support Talents/Skills  ADL's:  Intact  Cognition:  WNL  Sleep:   good      Assessment and Plan: Bipolar disorder type I.  Anxiety.  I reviewed her blood work results.  Her hemoglobin A1c is 5.8.  Her CBC and chemistry is normal.  Her vitamin B is low but she is taking supplemental vitamin B.  She like to continue her current medication.  She feels it is working.  I will continue carbamazepine 200 mg twice a day, Risperdal 2 mg at bedtime and amitriptyline 50 mg at bedtime.  Her Tegretol level which was done in January was 6.0.  Discussed medication side effects and benefits.  Recommended to call [name] back if she has any question or any concern.  Follow-up in 6 months.  Follow Up Instructions:  I discussed the assessment and treatment plan with the patient. The patient was provided an opportunity to ask questions and all were answered. The patient agreed with the plan and demonstrated an understanding of the instructions.   The patient was advised to call back or seek an in-person evaluation if the symptoms worsen or if the condition fails to improve as anticipated.  I provided 15 minutes of non-face-to-face time during this encounter.   [name], MD

## 2018-09-21 ENCOUNTER — Encounter: Payer: Self-pay | Admitting: Family Medicine

## 2018-09-21 ENCOUNTER — Other Ambulatory Visit: Payer: Self-pay

## 2018-09-21 ENCOUNTER — Ambulatory Visit (INDEPENDENT_AMBULATORY_CARE_PROVIDER_SITE_OTHER): Payer: No Typology Code available for payment source | Admitting: Family Medicine

## 2018-09-21 VITALS — BP 144/92 | HR 89 | Temp 95.6°F | Resp 18 | Wt 208.6 lb

## 2018-09-21 DIAGNOSIS — E559 Vitamin D deficiency, unspecified: Secondary | ICD-10-CM | POA: Diagnosis not present

## 2018-09-21 DIAGNOSIS — Z0000 Encounter for general adult medical examination without abnormal findings: Secondary | ICD-10-CM

## 2018-09-21 DIAGNOSIS — E785 Hyperlipidemia, unspecified: Secondary | ICD-10-CM

## 2018-09-21 DIAGNOSIS — Z23 Encounter for immunization: Secondary | ICD-10-CM | POA: Diagnosis not present

## 2018-09-21 DIAGNOSIS — F319 Bipolar disorder, unspecified: Secondary | ICD-10-CM

## 2018-09-21 DIAGNOSIS — Z79899 Other long term (current) drug therapy: Secondary | ICD-10-CM

## 2018-09-21 DIAGNOSIS — R739 Hyperglycemia, unspecified: Secondary | ICD-10-CM | POA: Diagnosis not present

## 2018-09-21 DIAGNOSIS — L309 Dermatitis, unspecified: Secondary | ICD-10-CM | POA: Insufficient documentation

## 2018-09-21 DIAGNOSIS — E538 Deficiency of other specified B group vitamins: Secondary | ICD-10-CM

## 2018-09-21 DIAGNOSIS — N289 Disorder of kidney and ureter, unspecified: Secondary | ICD-10-CM

## 2018-09-21 DIAGNOSIS — E039 Hypothyroidism, unspecified: Secondary | ICD-10-CM

## 2018-09-21 LAB — COMPREHENSIVE METABOLIC PANEL
ALT: 11 U/L (ref 0–35)
AST: 11 U/L (ref 0–37)
Albumin: 4.2 g/dL (ref 3.5–5.2)
Alkaline Phosphatase: 80 U/L (ref 39–117)
BUN: 18 mg/dL (ref 6–23)
CO2: 26 mEq/L (ref 19–32)
Calcium: 9.5 mg/dL (ref 8.4–10.5)
Chloride: 107 mEq/L (ref 96–112)
Creatinine, Ser: 1 mg/dL (ref 0.40–1.20)
GFR: 55.13 mL/min — ABNORMAL LOW (ref >60.00)
Glucose, Bld: 98 mg/dL (ref 70–99)
Potassium: 4.4 mEq/L (ref 3.5–5.1)
Sodium: 139 mEq/L (ref 135–145)
Total Bilirubin: 0.3 mg/dL (ref 0.2–1.2)
Total Protein: 6.5 g/dL (ref 6.0–8.3)

## 2018-09-21 LAB — LIPID PANEL
Cholesterol: 211 mg/dL — ABNORMAL HIGH (ref 0–200)
HDL: 42.8 mg/dL (ref >39.00)
NonHDL: 167.9
Total CHOL/HDL Ratio: 5
Triglycerides: 249 mg/dL — ABNORMAL HIGH (ref 0.0–149.0)
VLDL: 49.8 mg/dL — ABNORMAL HIGH (ref 0.0–40.0)

## 2018-09-21 LAB — HEMOGLOBIN A1C: Hgb A1c MFr Bld: 5.8 % (ref 4.6–6.5)

## 2018-09-21 LAB — CBC WITH DIFFERENTIAL/PLATELET
Basophils Absolute: 0 10*3/uL (ref 0.0–0.1)
Basophils Relative: 0.5 % (ref 0.0–3.0)
Eosinophils Absolute: 0.3 10*3/uL (ref 0.0–0.7)
Eosinophils Relative: 5.8 % — ABNORMAL HIGH (ref 0.0–5.0)
HCT: 42.2 % (ref 36.0–46.0)
Hemoglobin: 14.4 g/dL (ref 12.0–15.0)
Lymphocytes Relative: 24.2 % (ref 12.0–46.0)
Lymphs Abs: 1.3 10*3/uL (ref 0.7–4.0)
MCHC: 34.2 g/dL (ref 30.0–36.0)
MCV: 96.7 fl (ref 78.0–100.0)
Monocytes Absolute: 0.7 10*3/uL (ref 0.1–1.0)
Monocytes Relative: 12.3 % — ABNORMAL HIGH (ref 3.0–12.0)
Neutro Abs: 3.2 10*3/uL (ref 1.4–7.7)
Neutrophils Relative %: 57.2 % (ref 43.0–77.0)
Platelets: 204 10*3/uL (ref 150.0–400.0)
RBC: 4.36 Mil/uL (ref 3.87–5.11)
RDW: 13 % (ref 11.5–15.5)
WBC: 5.6 10*3/uL (ref 4.0–10.5)

## 2018-09-21 LAB — LDL CHOLESTEROL, DIRECT: Direct LDL: 137 mg/dL

## 2018-09-21 LAB — VITAMIN B12: Vitamin B-12: 250 pg/mL (ref 211–911)

## 2018-09-21 LAB — VITAMIN D 25 HYDROXY (VIT D DEFICIENCY, FRACTURES): VITD: 15.87 ng/mL — ABNORMAL LOW (ref 30.00–100.00)

## 2018-09-21 LAB — TSH: TSH: 1.48 u[IU]/mL (ref 0.35–4.50)

## 2018-09-21 MED ORDER — LEVOTHYROXINE SODIUM 112 MCG PO TABS: 112.0000 ug | ORAL_TABLET | Freq: Every day | ORAL | 1 refills | Status: DC

## 2018-09-21 MED FILL — LEVOTHYROXINE 112 MCG TAB: 112 | 90 days supply | Qty: 90 | Fill #0

## 2018-09-21 NOTE — Patient Instructions (Addendum)
Shingrrix is two shots ove rth 2-6 months at pharmacy  Pulse oximeter Omron blood pressure cuff, upper arm Vitamin C and Zinc Deep breathing exercises  Preventive Care 68 Years and Older, Female Preventive care refers to lifestyle choices and visits with your health care provider that can promote health and wellness. This includes:  A yearly physical exam. This is also called an annual well check.  Regular dental and eye exams.  Immunizations.  Screening for certain conditions.  Healthy lifestyle choices, such as diet and exercise. What can I expect for my preventive care visit? Physical exam Your health care provider will check:  Height and weight. These may be used to calculate body mass index (BMI), which is a measurement that tells if you are at a healthy weight.  Heart rate and blood pressure.  Your skin for abnormal spots. Counseling Your health care provider may ask you questions about:  Alcohol, tobacco, and drug use.  Emotional well-being.  Home and relationship well-being.  Sexual activity.  Eating habits.  History of falls.  Memory and ability to understand (cognition).  Work and work Statistician.  Pregnancy and menstrual history. What immunizations do I need?  Influenza (flu) vaccine  This is recommended every year. Tetanus, diphtheria, and pertussis (Tdap) vaccine  You may need a Td booster every 10 years. Varicella (chickenpox) vaccine  You may need this vaccine if you have not already been vaccinated. Zoster (shingles) vaccine  You may need this after age 68. Pneumococcal conjugate (PCV13) vaccine  One dose is recommended after age 2. Pneumococcal polysaccharide (PPSV23) vaccine  One dose is recommended after age 68. Measles, mumps, and rubella (MMR) vaccine  You may need at least one dose of MMR if you were born in 1957 or later. You may also need a second dose. Meningococcal conjugate (MenACWY) vaccine  You may need this if  you have certain conditions. Hepatitis A vaccine  You may need this if you have certain conditions or if you travel or work in places where you may be exposed to hepatitis A. Hepatitis B vaccine  You may need this if you have certain conditions or if you travel or work in places where you may be exposed to hepatitis B. Haemophilus influenzae type b (Hib) vaccine  You may need this if you have certain conditions. You may receive vaccines as individual doses or as more than one vaccine together in one shot (combination vaccines). Talk with your health care provider about the risks and benefits of combination vaccines. What tests do I need? Blood tests  Lipid and cholesterol levels. These may be checked every 5 years, or more frequently depending on your overall health.  Hepatitis C test.  Hepatitis B test. Screening  Lung cancer screening. You may have this screening every year starting at age 68 if you have a 30-pack-year history of smoking and currently smoke or have quit within the past 15 years.  Colorectal cancer screening. All adults should have this screening starting at age 68 and continuing until age 22. Your health care provider may recommend screening at age 68 if you are at increased risk. You will have tests every 1-10 years, depending on your results and the type of screening test.  Diabetes screening. This is done by checking your blood sugar (glucose) after you have not eaten for a while (fasting). You may have this done every 1-3 years.  Mammogram. This may be done every 1-2 years. Talk with your health care provider about how  often you should have regular mammograms.  BRCA-related cancer screening. This may be done if you have a family history of breast, ovarian, tubal, or peritoneal cancers. Other tests  Sexually transmitted disease (STD) testing.  Bone density scan. This is done to screen for osteoporosis. You may have this done starting at age 68. Follow these  instructions at home: Eating and drinking  Eat a diet that includes fresh fruits and vegetables, whole grains, lean protein, and low-fat dairy products. Limit your intake of foods with high amounts of sugar, saturated fats, and salt.  Take vitamin and mineral supplements as recommended by your health care provider.  Do not drink alcohol if your health care provider tells you not to drink.  If you drink alcohol: ? Limit how much you have to 0-1 drink a day. ? Be aware of how much alcohol is in your drink. In the [place] one drink equals one 12 oz bottle of beer (355 mL), one 5 oz glass of wine (148 mL), or one 1 oz glass of hard liquor (44 mL). Lifestyle  Take daily care of your teeth and gums.  Stay active. Exercise for at least 30 minutes on 5 or more days each week.  Do not use any products that contain nicotine or tobacco, such as cigarettes, e-cigarettes, and chewing tobacco. If you need help quitting, ask your health care provider.  If you are sexually active, practice safe sex. Use a condom or other form of protection in order to prevent STIs (sexually transmitted infections).  Talk with your health care provider about taking a low-dose aspirin or statin. What's next?  Go to your health care provider once a year for a well check visit.  Ask your health care provider how often you should have your eyes and teeth checked.  Stay up to date on all vaccines. This information is not intended to replace advice given to you by your health care provider. Make sure you discuss any questions you have with your health care provider. Document Released: 02/23/2015 Document Revised: 01/21/2018 Document Reviewed: 01/21/2018 Elsevier Patient Education  2020 [employer]  

## 2018-09-21 NOTE — Progress Notes (Signed)
Subjective:    Patient ID: Margaret [name], female    DOB: Dec 19, 1950, 68 y.o.   MRN: [id number]  No chief complaint on file.   HPI Patient is in today for annual preventative exam and follow up on chronic medical concerns including hyperglycemia, hyperlipidemia and bipolar disorder. No recent febrile illness or hospitalizations. No acute concerns. No polyuria or polydipsia. Is staying in quarantine and maintaining a heart healthy diet. Denies CP/palp/SOB/HA/congestion/fevers/GI or GU c/o. Taking meds as prescribed  Past Medical History:  Diagnosis Date   Anxiety    Arthritis 02/14/2013   Bipolar disorder ([place])    Esophageal reflux 09/19/2012   only slight with gallbladder problems   Hypoglycemia    Hypothyroidism    Other malaise and fatigue 09/19/2012   PONV (postoperative nausea and vomiting)    Preventative health care 06/09/2016   PTSD (post-traumatic stress disorder)    Thyroid disease     Past Surgical History:  Procedure Laterality Date   CHOLECYSTECTOMY N/A 09/01/2016   Procedure: LAPAROSCOPIC CHOLECYSTECTOMY WITH INTRAOPERATIVE CHOLANGIOGRAM;  Surgeon: Jovita Kussmaul, MD;  Location: [facility] OR;  Service: General;  Laterality: N/A;   DG 4TH DIGIT LEFT FOOT     fooet surgery Left 2014   reconstruction of foot and ankle to correct deformity   NECK SURGERY     OTHER SURGICAL HISTORY     OTHER SURGICAL HISTORY     OTHER SURGICAL HISTORY     TUBAL LIGATION     reversal    Family History  Problem Relation Age of Onset   Diabetes Mother        Brother 9 of 2   Heart failure Mother    Prostate cancer Father    Parkinsonism Father        deceased   Hypertension Brother    Esophageal cancer Maternal Grandmother    Cancer Paternal Grandmother        cancer of jawbone 1 of 2   Melanoma Brother    Breast cancer Neg Hx    Colon cancer Neg Hx     Social History   Socioeconomic History   Marital status: Divorced    Spouse name: Not on file    Number of children: Not on file   Years of education: Not on file   Highest education level: Not on file  Occupational History   Not on file  Social Needs   Financial resource strain: Not on file   Food insecurity    Worry: Not on file    Inability: Not on file   Transportation needs    Medical: Not on file    Non-medical: Not on file  Tobacco Use   Smoking status: Former Smoker    Packs/day: 3.00    Years: 24.00    Pack years: 72.00    Types: Cigarettes    Quit date: 1995    Years since quitting: 25.6   Smokeless tobacco: Never Used  Substance and Sexual Activity   Alcohol use: No    Alcohol/week: 0.0 standard drinks   Drug use: No   Sexual activity: Not on file  Lifestyle   Physical activity    Days per week: Not on file    Minutes per session: Not on file   Stress: Not on file  Relationships   Social connections    Talks on phone: Not on file    Gets together: Not on file    Attends religious service: Not on file  Active member of club or organization: Not on file    Attends meetings of clubs or organizations: Not on file    Relationship status: Not on file   Intimate partner violence    Fear of current or ex partner: Not on file    Emotionally abused: Not on file    Physically abused: Not on file    Forced sexual activity: Not on file  Other Topics Concern   Not on file  Social History Narrative   Not on file    Outpatient Medications Prior to Visit  Medication Sig Dispense Refill   amitriptyline (ELAVIL) 50 MG tablet Take 1 tablet (50 mg total) by mouth at bedtime. 90 tablet 1   carbamazepine (CARBATROL) 200 MG 12 hr capsule Take 1 capsule (200 mg total) by mouth 2 (two) times daily. 180 capsule 1   Clobetasol Propionate 0.05 % lotion Apply 1 application topically 2 (two) times daily. (Patient taking differently: Apply 1 application topically daily as needed (eczema). ) 118 mL 1   pimecrolimus (ELIDEL) 1 % cream Apply topically 2  (two) times daily. 30 g 0   ranitidine (ZANTAC) 150 MG capsule Take 1 capsule (150 mg total) by mouth 2 (two) times daily. 60 capsule 0   risperiDONE (RISPERDAL) 2 MG tablet Take 1 tablet (2 mg total) by mouth at bedtime. 90 tablet 1   amoxicillin-clavulanate (AUGMENTIN) 875-125 MG tablet Take 1 tablet by mouth 2 (two) times daily. (Patient not taking: Reported on 04/21/2018) 20 tablet 0   ciprofloxacin (CIPRO) 250 MG tablet Take 1 tablet (250 mg total) by mouth 2 (two) times daily. 6 tablet 0   levothyroxine (SYNTHROID) 112 MCG tablet TAKE 1 TABLET (112 MCG TOTAL) BY MOUTH DAILY BEFORE BREAKFAST. 90 tablet 0   neomycin-polymyxin-hydrocortisone (CORTISPORIN) OTIC solution Place 3 drops into the right ear 4 (four) times daily. 10 mL 0   No facility-administered medications prior to visit.     Allergies  Allergen Reactions   Novocain [Procaine] Other (See Comments)    Heart race   Codeine Nausea Only   Darvon Other (See Comments)    Hallucinations    Sulfa Drugs Cross Reactors Nausea And Vomiting    Review of Systems  Constitutional: Negative for chills, fever and malaise/fatigue.  HENT: Negative for congestion and hearing loss.   Eyes: Negative for discharge.  Respiratory: Negative for cough, sputum production and shortness of breath.   Cardiovascular: Negative for chest pain, palpitations and leg swelling.  Gastrointestinal: Negative for abdominal pain, blood in stool, constipation, diarrhea, heartburn, nausea and vomiting.  Genitourinary: Negative for dysuria, frequency, hematuria and urgency.  Musculoskeletal: Negative for back pain, falls and myalgias.  Skin: Positive for itching and rash.  Neurological: Negative for dizziness, sensory change, loss of consciousness, weakness and headaches.  Endo/Heme/Allergies: Negative for environmental allergies. Does not bruise/bleed easily.  Psychiatric/Behavioral: Negative for depression and suicidal ideas. The patient is not  nervous/anxious and does not have insomnia.        Objective:    Physical Exam Constitutional:      General: She is not in acute distress.    Appearance: She is not diaphoretic.  HENT:     Head: Normocephalic and atraumatic.     Right Ear: External ear normal.     Left Ear: External ear normal.     Nose: Nose normal.     Mouth/Throat:     Pharynx: No oropharyngeal exudate.  Eyes:     General: No scleral icterus.  Right eye: No discharge.        Left eye: No discharge.     Conjunctiva/sclera: Conjunctivae normal.     Pupils: Pupils are equal, round, and reactive to light.  Neck:     Musculoskeletal: Normal range of motion and neck supple.     Thyroid: No thyromegaly.  Cardiovascular:     Rate and Rhythm: Normal rate and regular rhythm.     Heart sounds: Normal heart sounds. No murmur.  Pulmonary:     Effort: Pulmonary effort is normal. No respiratory distress.     Breath sounds: Normal breath sounds. No wheezing or rales.  Abdominal:     General: Bowel sounds are normal. There is no distension.     Palpations: Abdomen is soft. There is no mass.     Tenderness: There is no abdominal tenderness.  Musculoskeletal: Normal range of motion.        General: No tenderness.  Lymphadenopathy:     Cervical: No cervical adenopathy.  Skin:    General: Skin is warm and dry.     Findings: Erythema and rash present.     Comments: Plaques with overlying papules on both hands   Neurological:     Mental Status: She is alert and oriented to person, place, and time.     Cranial Nerves: No cranial nerve deficit.     Coordination: Coordination normal.     Deep Tendon Reflexes: Reflexes are normal and symmetric. Reflexes normal.     BP (!) 144/92 (BP Location: Left Arm, Patient Position: Sitting, Cuff Size: Normal)    Pulse 89    Temp (!) 95.6 F (35.3 C) (Oral)    Resp 18    Wt 208 lb 9.6 oz (94.6 kg)    SpO2 98%    BMI 32.67 kg/m  Wt Readings from Last 3 Encounters:  09/21/18  208 lb 9.6 oz (94.6 kg)  04/21/18 204 lb (92.5 kg)  01/15/18 202 lb 9.6 oz (91.9 kg)    Diabetic Foot Exam - Simple   No data filed     Lab Results  Component Value Date   WBC 5.6 09/21/2018   HGB 14.4 09/21/2018   HCT 42.2 09/21/2018   PLT 204.0 09/21/2018   GLUCOSE 98 09/21/2018   CHOL 211 (H) 09/21/2018   TRIG 249.0 (H) 09/21/2018   HDL 42.80 09/21/2018   LDLDIRECT 137.0 09/21/2018   LDLCALC 118 (H) 03/16/2017   ALT 11 09/21/2018   AST 11 09/21/2018   NA 139 09/21/2018   K 4.4 09/21/2018   CL 107 09/21/2018   CREATININE 1.00 09/21/2018   BUN 18 09/21/2018   CO2 26 09/21/2018   TSH 1.48 09/21/2018   HGBA1C 5.8 09/21/2018    Lab Results  Component Value Date   TSH 1.48 09/21/2018   Lab Results  Component Value Date   WBC 5.6 09/21/2018   HGB 14.4 09/21/2018   HCT 42.2 09/21/2018   MCV 96.7 09/21/2018   PLT 204.0 09/21/2018   Lab Results  Component Value Date   NA 139 09/21/2018   K 4.4 09/21/2018   CO2 26 09/21/2018   GLUCOSE 98 09/21/2018   BUN 18 09/21/2018   CREATININE 1.00 09/21/2018   BILITOT 0.3 09/21/2018   ALKPHOS 80 09/21/2018   AST 11 09/21/2018   ALT 11 09/21/2018   PROT 6.5 09/21/2018   ALBUMIN 4.2 09/21/2018   CALCIUM 9.5 09/21/2018   ANIONGAP 4 (L) 08/26/2016   GFR 55.13 (L) 09/21/2018  Lab Results  Component Value Date   CHOL 211 (H) 09/21/2018   Lab Results  Component Value Date   HDL 42.80 09/21/2018   Lab Results  Component Value Date   LDLCALC 118 (H) 03/16/2017   Lab Results  Component Value Date   TRIG 249.0 (H) 09/21/2018   Lab Results  Component Value Date   CHOLHDL 5 09/21/2018   Lab Results  Component Value Date   HGBA1C 5.8 09/21/2018       Assessment & Plan:   Problem List Items Addressed This Visit    Hypothyroidism    On Levothyroxine, continue to monitor      Relevant Medications   levothyroxine (SYNTHROID) 112 MCG tablet   Other Relevant Orders   CBC with Differential/Platelet  (Completed)   Comprehensive metabolic panel (Completed)   TSH (Completed)   Bipolar disorder ([place])    Stable on current meds. Check Carbamezapine level today      Hyperlipidemia    Encouraged heart healthy diet, increase exercise, avoid trans fats, consider a krill oil cap daily.      Relevant Orders   Lipid panel (Completed)   Hyperglycemia    hgba1c acceptable, minimize simple carbs. Increase exercise as tolerated.       Relevant Orders   Hemoglobin A1c (Completed)   CBC with Differential/Platelet (Completed)   Comprehensive metabolic panel (Completed)   Renal insufficiency    Hydrate and monitor      Preventative health care - Primary    Patient encouraged to maintain heart healthy diet, regular exercise, adequate sleep. Consider daily probiotics. Take medications as prescribed. Labs ordered and reviewed.       Eczema    Hands with significant rash, itching, skin breaks down with all of her hand washing. Does not tolerate steroid creams any longer. Will refer to dermatology for further consideration      Relevant Orders   Ambulatory referral to Dermatology    Other Visit Diagnoses    B12 deficiency       Relevant Orders   Vitamin B12 (Completed)   Vitamin D deficiency       Relevant Orders   VITAMIN D 25 Hydroxy (Vit-D Deficiency, Fractures) (Completed)   Encounter for long-term (current) use of medications       Relevant Orders   Carbamazepine Level (Tegretol), total      I have discontinued Claritza July. Veldman's neomycin-polymyxin-hydrocortisone, amoxicillin-clavulanate, and ciprofloxacin. I am also having her maintain her Clobetasol Propionate, ranitidine, pimecrolimus, risperiDONE, carbamazepine, amitriptyline, and levothyroxine.  Meds ordered this encounter  Medications   levothyroxine (SYNTHROID) 112 MCG tablet    Sig: Take 1 tablet (112 mcg total) by mouth daily before breakfast.    Dispense:  90 tablet    Refill:  1     Penni Homans, MD

## 2018-09-21 NOTE — Assessment & Plan Note (Signed)
Hands with significant rash, itching, skin breaks down with all of her hand washing. Does not tolerate steroid creams any longer. Will refer to dermatology for further consideration

## 2018-09-21 NOTE — Assessment & Plan Note (Signed)
Stable on current meds. Check Carbamezapine level today

## 2018-09-21 NOTE — Assessment & Plan Note (Signed)
On Levothyroxine, continue to monitor 

## 2018-09-21 NOTE — Assessment & Plan Note (Signed)
Patient encouraged to maintain heart healthy diet, regular exercise, adequate sleep. Consider daily probiotics. Take medications as prescribed. Labs ordered and reviewed 

## 2018-09-21 NOTE — Assessment & Plan Note (Signed)
Encouraged heart healthy diet, increase exercise, avoid trans fats, consider a krill oil cap daily 

## 2018-09-21 NOTE — Assessment & Plan Note (Addendum)
hgba1c acceptable, minimize simple carbs. Increase exercise as tolerated.  

## 2018-09-21 NOTE — Assessment & Plan Note (Signed)
Hydrate and monitor 

## 2018-09-22 ENCOUNTER — Telehealth: Payer: Self-pay

## 2018-09-22 LAB — CARBAMAZEPINE LEVEL, TOTAL: Carbamazepine Lvl: 6.3 mg/L (ref 4.0–12.0)

## 2018-09-22 NOTE — Telephone Encounter (Signed)
Copied from [place] [id number]. Topic: Referral - Status >> Sep 22, 2018  1:47 PM Alanda Slim E wrote: Reason for CRM: [facility] called and stated that the Pt didn't not want to schedule with them when they called her. The Pt would like a referral for someone In [place] or in her insurance network/ please advise

## 2018-09-23 ENCOUNTER — Other Ambulatory Visit (HOSPITAL_BASED_OUTPATIENT_CLINIC_OR_DEPARTMENT_OTHER): Payer: Self-pay | Admitting: Family Medicine

## 2018-09-23 DIAGNOSIS — Z1231 Encounter for screening mammogram for malignant neoplasm of breast: Secondary | ICD-10-CM

## 2018-09-23 NOTE — Telephone Encounter (Signed)
Can you change this or do you need a new referral?

## 2018-09-24 ENCOUNTER — Telehealth: Payer: Self-pay | Admitting: Family Medicine

## 2018-09-24 MED ORDER — VITAMIN D (ERGOCALCIFEROL) 1.25 MG (50000 UNIT) PO CAPS: 50000.0000 [IU] | ORAL_CAPSULE | ORAL | 4 refills | Status: DC

## 2018-09-24 MED ORDER — ATORVASTATIN CALCIUM 10 MG PO TABS: 10.0000 mg | ORAL_TABLET | Freq: Every day | ORAL | 3 refills | Status: DC

## 2018-09-24 MED FILL — VIT D2 1.25 MG (50,000 UNIT: 1.25 MG | 84 days supply | Qty: 12 | Fill #0

## 2018-09-24 MED FILL — ATORVASTATIN 10 MG TABLET: 10 | 90 days supply | Qty: 90 | Fill #0

## 2018-09-24 NOTE — Telephone Encounter (Signed)
Patient stated that while receiving lab results, she declined the recommendation of starting a statin medication but has since changed her mind and is willing to consider. She inquired if the medication could be sent to pharmacy on file and is requesting a call back from [place] to discuss further. Please advise.

## 2018-09-24 NOTE — Telephone Encounter (Signed)
Patient called back to have medications sent in to her pharmacy

## 2018-09-28 ENCOUNTER — Other Ambulatory Visit: Payer: Self-pay

## 2018-09-28 ENCOUNTER — Encounter: Payer: Self-pay | Admitting: Family Medicine

## 2018-09-28 ENCOUNTER — Ambulatory Visit: Payer: Self-pay | Admitting: *Deleted

## 2018-09-28 ENCOUNTER — Ambulatory Visit (INDEPENDENT_AMBULATORY_CARE_PROVIDER_SITE_OTHER): Payer: No Typology Code available for payment source | Admitting: Family Medicine

## 2018-09-28 VITALS — BP 150/90 | HR 110 | Temp 96.8°F | Resp 18 | Ht 67.0 in | Wt 207.4 lb

## 2018-09-28 DIAGNOSIS — I10 Essential (primary) hypertension: Secondary | ICD-10-CM | POA: Diagnosis not present

## 2018-09-28 DIAGNOSIS — B029 Zoster without complications: Secondary | ICD-10-CM | POA: Diagnosis not present

## 2018-09-28 HISTORY — DX: Essential (primary) hypertension: I10

## 2018-09-28 MED ORDER — LISINOPRIL 10 MG PO TABS: 10.0000 mg | ORAL_TABLET | Freq: Every day | ORAL | 3 refills | Status: DC

## 2018-09-28 MED FILL — LISINOPRIL 10 MG TABLET: 10 | 90 days supply | Qty: 90 | Fill #0

## 2018-09-28 NOTE — Progress Notes (Signed)
Patient ID: Margaret [name], female    DOB: May 24, 1950  Age: 68 y.o. MRN: [id number]    Subjective:  Subjective  HPI Margaret [name] presents for r L side of breast and under breast x 1 week.  Just prior to that she had pain in that same area She is also c/o some dizziness since then and is concerned because her bp is running high No cp, no sob   Review of Systems  Constitutional: Negative for chills and fever.  HENT: Negative for congestion and hearing loss.   Eyes: Negative for discharge.  Respiratory: Negative for cough and shortness of breath.   Cardiovascular: Negative for chest pain, palpitations and leg swelling.  Gastrointestinal: Negative for abdominal pain, blood in stool, constipation, diarrhea, nausea and vomiting.  Genitourinary: Negative for dysuria, frequency, hematuria and urgency.  Musculoskeletal: Negative for back pain and myalgias.  Skin: Negative for rash.  Allergic/Immunologic: Negative for environmental allergies.  Neurological: Positive for dizziness and light-headedness. Negative for weakness and headaches.  Hematological: Does not bruise/bleed easily.  Psychiatric/Behavioral: Negative for suicidal ideas. The patient is not nervous/anxious.     History Past Medical History:  Diagnosis Date  . Anxiety   . Arthritis 02/14/2013  . Bipolar disorder ([place])   . Esophageal reflux 09/19/2012   only slight with gallbladder problems  . Essential hypertension 09/28/2018  . Hypoglycemia   . Hypothyroidism   . Other malaise and fatigue 09/19/2012  . PONV (postoperative nausea and vomiting)   . Preventative health care 06/09/2016  . PTSD (post-traumatic stress disorder)   . Thyroid disease     She has a past surgical history that includes Tubal ligation; fooet surgery (Left, 2014); DG 4TH DIGIT LEFT FOOT; Neck surgery; OTHER SURGICAL HISTORY; OTHER SURGICAL HISTORY; OTHER SURGICAL HISTORY; and Cholecystectomy (N/A, 09/01/2016).   Her family history includes Cancer in her  paternal grandmother; Diabetes in her mother; Esophageal cancer in her maternal grandmother; Heart failure in her mother; Hypertension in her brother; Melanoma in her brother; Parkinsonism in her father; Prostate cancer in her father.She reports that she quit smoking about 25 years ago. Her smoking use included cigarettes. She has a 72.00 pack-year smoking history. She has never used smokeless tobacco. She reports that she does not drink alcohol or use drugs.  Current Outpatient Medications on File Prior to Visit  Medication Sig Dispense Refill  . amitriptyline (ELAVIL) 50 MG tablet Take 1 tablet (50 mg total) by mouth at bedtime. 90 tablet 1  . carbamazepine (CARBATROL) 200 MG 12 hr capsule Take 1 capsule (200 mg total) by mouth 2 (two) times daily. 180 capsule 1  . levothyroxine (SYNTHROID) 112 MCG tablet Take 1 tablet (112 mcg total) by mouth daily before breakfast. 90 tablet 1  . risperiDONE (RISPERDAL) 2 MG tablet Take 1 tablet (2 mg total) by mouth at bedtime. 90 tablet 1  . atorvastatin (LIPITOR) 10 MG tablet Take 1 tablet (10 mg total) by mouth daily. (Patient not taking: Reported on 09/28/2018) 90 tablet 3  . Clobetasol Propionate 0.05 % lotion Apply 1 application topically 2 (two) times daily. (Patient not taking: Reported on 09/28/2018) 118 mL 1  . pimecrolimus (ELIDEL) 1 % cream Apply topically 2 (two) times daily. (Patient not taking: Reported on 09/28/2018) 30 g 0  . ranitidine (ZANTAC) 150 MG capsule Take 1 capsule (150 mg total) by mouth 2 (two) times daily. (Patient not taking: Reported on 09/28/2018) 60 capsule 0  . Vitamin D, Ergocalciferol, (DRISDOL) 1.25 MG (50000  UT) CAPS capsule Take 1 capsule (50,000 Units total) by mouth every 7 (seven) days. (Patient not taking: Reported on 09/28/2018) 4 capsule 4   No current facility-administered medications on file prior to visit.      Objective:  Objective  Physical Exam Vitals signs and nursing note reviewed.  Constitutional:       Appearance: She is well-developed.  HENT:     Head: Normocephalic and atraumatic.  Eyes:     Conjunctiva/sclera: Conjunctivae normal.  Neck:     Musculoskeletal: Normal range of motion and neck supple.     Thyroid: No thyromegaly.     Vascular: No carotid bruit or JVD.  Cardiovascular:     Rate and Rhythm: Normal rate and regular rhythm.     Heart sounds: Normal heart sounds. No murmur.  Pulmonary:     Effort: Pulmonary effort is normal. No respiratory distress.     Breath sounds: Normal breath sounds. No wheezing or rales.  Chest:     Chest wall: No tenderness.  Skin:    Findings: Erythema and rash present. Rash is vesicular.       Neurological:     Mental Status: She is alert and oriented to person, place, and time.    BP (!) 150/90 (BP Location: Left Arm, Patient Position: Sitting, Cuff Size: Normal)   Pulse (!) 110   Temp (!) 96.8 F (36 C) (Temporal)   Resp 18   Ht 5\' 7"  (1.702 m)   Wt 207 lb 6.4 oz (94.1 kg)   SpO2 96%   BMI 32.48 kg/m  Wt Readings from Last 3 Encounters:  09/28/18 207 lb 6.4 oz (94.1 kg)  09/21/18 208 lb 9.6 oz (94.6 kg)  04/21/18 204 lb (92.5 kg)     Lab Results  Component Value Date   WBC 5.6 09/21/2018   HGB 14.4 09/21/2018   HCT 42.2 09/21/2018   PLT 204.0 09/21/2018   GLUCOSE 98 09/21/2018   CHOL 211 (H) 09/21/2018   TRIG 249.0 (H) 09/21/2018   HDL 42.80 09/21/2018   LDLDIRECT 137.0 09/21/2018   LDLCALC 118 (H) 03/16/2017   ALT 11 09/21/2018   AST 11 09/21/2018   NA 139 09/21/2018   K 4.4 09/21/2018   CL 107 09/21/2018   CREATININE 1.00 09/21/2018   BUN 18 09/21/2018   CO2 26 09/21/2018   TSH 1.48 09/21/2018   HGBA1C 5.8 09/21/2018    Dg Bone Density  Result Date: 05/27/2017 EXAM: DUAL X-RAY ABSORPTIOMETRY (DXA) FOR BONE MINERAL DENSITY IMPRESSION: Referring Physician:  Mosie Lukes PATIENT: Name: Margaret [name], Margaret [name] Patient ID: [id number] Birth Date: 23-Jun-1950 Height: 65.5 in. Sex: Female Measured: 05/27/2017 Weight: 198.0 lbs.  Indications: Advanced Age, Caucasian, Estrogen Deficiency, Hypothyroidism, Low Calcium Intake, Post Menopausal, Vitamin D Deficiency Fractures: Ankle Treatments: Levothyroxine ASSESSMENT: The BMD measured at Femur Neck Left is 0.892 g/cm2 with a T-score of -1.0. This patient is considered normal according to [facility]) criteria. L-4 was excluded due to degenerative changes. Site Region Measured Date Measured Age WHO YA BMD Classification T-score AP Spine L1-L3 05/27/2017 66.6 years Normal -0.7 1.086 g/cm2 DualFemur Neck Left 05/27/2017 66.6 years Normal -1.0 0.892 g/cm2 World Health Organization [facility]) criteria for post-menopausal, Caucasian Women: Normal       T-score at or above -1 SD Osteopenia   T-score between -1 and -2.5 SD Osteoporosis T-score at or below -2.5 SD RECOMMENDATION: [place] recommends that FDA-approved medical therapies be considered in postmenopausal women and men  age 58 or older with a: 1. Hip or vertebral (clinical or morphometric) fracture. 2. T-score of < -2.5 at the spine or hip. 3. Ten-year fracture probability by FRAX of 3% or greater for hip fracture or 20% or greater for major osteoporotic fracture. All treatment decisions require clinical judgment and consideration of individual patient factors, including patient preferences, co-morbidities, previous drug use, risk factors not captured in the FRAX model (e.g. falls, vitamin D deficiency, increased bone turnover, interval significant decline in bone density) and possible under - or over-estimation of fracture risk by FRAX. All patients should ensure an adequate intake of dietary calcium (1200 mg/d) and vitamin D (800 IU daily) unless contraindicated. FOLLOW-UP: People with diagnosed cases of osteoporosis or at high risk for fracture should have regular bone mineral density tests. For patients eligible for Medicare, routine testing is allowed once every 2 years. The testing frequency can be  increased to one year for patients who have rapidly progressing disease, those who are receiving or discontinuing medical therapy to restore bone mass, or have additional risk factors. I have reviewed this report and agree with the above findings. [facility] Radiology Electronically Signed   By: Rolm Baptise M.D.   On: 05/27/2017 11:38   Mm 3d Screen Breast Bilateral  Result Date: 05/28/2017 CLINICAL DATA:  Screening. EXAM: DIGITAL SCREENING BILATERAL MAMMOGRAM WITH TOMO AND CAD COMPARISON:  Previous exam(s). ACR Breast Density Category c: The breast tissue is heterogeneously dense, which may obscure small masses. FINDINGS: There are no findings suspicious for malignancy. Images were processed with CAD. IMPRESSION: No mammographic evidence of malignancy. A result letter of this screening mammogram will be mailed directly to the patient. RECOMMENDATION: Screening mammogram in one year. (Code:SM-B-01Y) BI-RADS CATEGORY  1: Negative. Electronically Signed   By: Everlean Alstrom M.D.   On: 05/28/2017 08:18     Assessment & Plan:  Plan  I am having Margaret [name]. Margaret [name] start on lisinopril. I am also having her maintain her Clobetasol Propionate, ranitidine, pimecrolimus, risperiDONE, carbamazepine, amitriptyline, levothyroxine, Vitamin D (Ergocalciferol), and atorvastatin.  Meds ordered this encounter  Medications  . lisinopril (ZESTRIL) 10 MG tablet    Sig: Take 1 tablet (10 mg total) by mouth daily.    Dispense:  90 tablet    Refill:  3    Problem List Items Addressed This Visit      Unprioritized   Essential hypertension - Primary    New dx--- start lisinopril 10 mg  F/u pcp 2 weeks or sooner prn  Check bmp then      Relevant Medications   lisinopril (ZESTRIL) 10 MG tablet   Herpes zoster without complication    Started one week ago No new lesions Pt states tylenol is good enough and does not want anything else  rto prn         Follow-up: Return in about 2 weeks (around 10/12/2018), or if  symptoms worsen or fail to improve, for hypertension.  Ann Held, DO

## 2018-09-28 NOTE — Patient Instructions (Addendum)
DASH Eating Plan DASH stands for "Dietary Approaches to Stop Hypertension." The DASH eating plan is a healthy eating plan that has been shown to reduce high blood pressure (hypertension). It may also reduce your risk for type 2 diabetes, heart disease, and stroke. The DASH eating plan may also help with weight loss. What are tips for following this plan?  General guidelines  Avoid eating more than 2,300 mg (milligrams) of salt (sodium) a day. If you have hypertension, you may need to reduce your sodium intake to 1,500 mg a day.  Limit alcohol intake to no more than 1 drink a day for nonpregnant women and 2 drinks a day for men. One drink equals 12 oz of beer, 5 oz of wine, or 1 oz of hard liquor.  Work with your health care provider to maintain a healthy body weight or to lose weight. Ask what an ideal weight is for you.  Get at least 30 minutes of exercise that causes your heart to beat faster (aerobic exercise) most days of the week. Activities may include walking, swimming, or biking.  Work with your health care provider or diet and nutrition specialist (dietitian) to adjust your eating plan to your individual calorie needs. Reading food labels   Check food labels for the amount of sodium per serving. Choose foods with less than 5 percent of the Daily Value of sodium. Generally, foods with less than 300 mg of sodium per serving fit into this eating plan.  To find whole grains, look for the word "whole" as the first word in the ingredient list. Shopping  Buy products labeled as "low-sodium" or "no salt added."  Buy fresh foods. Avoid canned foods and premade or frozen meals. Cooking  Avoid adding salt when cooking. Use salt-free seasonings or herbs instead of table salt or sea salt. Check with your health care provider or pharmacist before using salt substitutes.  Do not fry foods. Cook foods using healthy methods such as baking, boiling, grilling, and broiling instead.  Cook with  heart-healthy oils, such as olive, canola, soybean, or sunflower oil. Meal planning  Eat a balanced diet that includes: ? 5 or more servings of fruits and vegetables each day. At each meal, try to fill half of your plate with fruits and vegetables. ? Up to 6-8 servings of whole grains each day. ? Less than 6 oz of lean meat, poultry, or fish each day. A 3-oz serving of meat is about the same size as a deck of cards. One egg equals 1 oz. ? 2 servings of low-fat dairy each day. ? A serving of nuts, seeds, or beans 5 times each week. ? Heart-healthy fats. Healthy fats called Omega-3 fatty acids are found in foods such as flaxseeds and coldwater fish, like sardines, salmon, and mackerel.  Limit how much you eat of the following: ? Canned or prepackaged foods. ? Food that is high in trans fat, such as fried foods. ? Food that is high in saturated fat, such as fatty meat. ? Sweets, desserts, sugary drinks, and other foods with added sugar. ? Full-fat dairy products.  Do not salt foods before eating.  Try to eat at least 2 vegetarian meals each week.  Eat more home-cooked food and less restaurant, buffet, and fast food.  When eating at a restaurant, ask that your food be prepared with less salt or no salt, if possible. What foods are recommended? The items listed may not be a complete list. Talk with your dietitian about   what dietary choices are best for you. Grains Whole-grain or whole-wheat bread. Whole-grain or whole-wheat pasta. Brown rice. Oatmeal. Quinoa. Bulgur. Whole-grain and low-sodium cereals. Pita bread. Low-fat, low-sodium crackers. Whole-wheat flour tortillas. Vegetables Fresh or frozen vegetables (raw, steamed, roasted, or grilled). Low-sodium or reduced-sodium tomato and vegetable juice. Low-sodium or reduced-sodium tomato sauce and tomato paste. Low-sodium or reduced-sodium canned vegetables. Fruits All fresh, dried, or frozen fruit. Canned fruit in natural juice (without  added sugar). Meat and other protein foods Skinless chicken or turkey. Ground chicken or turkey. Pork with fat trimmed off. Fish and seafood. Egg whites. Dried beans, peas, or lentils. Unsalted nuts, nut butters, and seeds. Unsalted canned beans. Lean cuts of beef with fat trimmed off. Low-sodium, lean deli meat. Dairy Low-fat (1%) or fat-free (skim) milk. Fat-free, low-fat, or reduced-fat cheeses. Nonfat, low-sodium ricotta or cottage cheese. Low-fat or nonfat yogurt. Low-fat, low-sodium cheese. Fats and oils Soft margarine without trans fats. Vegetable oil. Low-fat, reduced-fat, or light mayonnaise and salad dressings (reduced-sodium). Canola, safflower, olive, soybean, and sunflower oils. Avocado. Seasoning and other foods Herbs. Spices. Seasoning mixes without salt. Unsalted popcorn and pretzels. Fat-free sweets. What foods are not recommended? The items listed may not be a complete list. Talk with your dietitian about what dietary choices are best for you. Grains Baked goods made with fat, such as croissants, muffins, or some breads. Dry pasta or rice meal packs. Vegetables Creamed or fried vegetables. Vegetables in a cheese sauce. Regular canned vegetables (not low-sodium or reduced-sodium). Regular canned tomato sauce and paste (not low-sodium or reduced-sodium). Regular tomato and vegetable juice (not low-sodium or reduced-sodium). Pickles. Olives. Fruits Canned fruit in a light or heavy syrup. Fried fruit. Fruit in cream or butter sauce. Meat and other protein foods Fatty cuts of meat. Ribs. Fried meat. Bacon. Sausage. Bologna and other processed lunch meats. Salami. Fatback. Hotdogs. Bratwurst. Salted nuts and seeds. Canned beans with added salt. Canned or smoked fish. Whole eggs or egg yolks. Chicken or turkey with skin. Dairy Whole or 2% milk, cream, and half-and-half. Whole or full-fat cream cheese. Whole-fat or sweetened yogurt. Full-fat cheese. Nondairy creamers. Whipped toppings.  Processed cheese and cheese spreads. Fats and oils Butter. Stick margarine. Lard. Shortening. Ghee. Bacon fat. Tropical oils, such as coconut, palm kernel, or palm oil. Seasoning and other foods Salted popcorn and pretzels. Onion salt, garlic salt, seasoned salt, table salt, and sea salt. Worcestershire sauce. Tartar sauce. Barbecue sauce. Teriyaki sauce. Soy sauce, including reduced-sodium. Steak sauce. Canned and packaged gravies. Fish sauce. Oyster sauce. Cocktail sauce. Horseradish that you find on the shelf. Ketchup. Mustard. Meat flavorings and tenderizers. Bouillon cubes. Hot sauce and Tabasco sauce. Premade or packaged marinades. Premade or packaged taco seasonings. Relishes. Regular salad dressings. Where to find more information:  National Heart, Lung, and Blood Institute: www.nhlbi.nih.gov  American Heart Association: www.heart.org Summary  The DASH eating plan is a healthy eating plan that has been shown to reduce high blood pressure (hypertension). It may also reduce your risk for type 2 diabetes, heart disease, and stroke.  With the DASH eating plan, you should limit salt (sodium) intake to 2,300 mg a day. If you have hypertension, you may need to reduce your sodium intake to 1,500 mg a day.  When on the DASH eating plan, aim to eat more fresh fruits and vegetables, whole grains, lean proteins, low-fat dairy, and heart-healthy fats.  Work with your health care provider or diet and nutrition specialist (dietitian) to adjust your eating plan to your   individual calorie needs. This information is not intended to replace advice given to you by your health care provider. Make sure you discuss any questions you have with your health care provider. Document Released: 01/16/2011 Document Revised: 01/09/2017 Document Reviewed: 01/21/2016 Elsevier Patient Education  2020 [place], which is also known as herpes zoster, is an infection that causes a painful  skin rash and fluid-filled blisters. It is caused by a virus. Shingles only develops in people who:  Have had chickenpox.  Have been given a medicine to protect against chickenpox (have been vaccinated). Shingles is rare in this group. What are the causes? Shingles is caused by varicella-zoster virus (VZV). This is the same virus that causes chickenpox. After a person is exposed to VZV, the virus stays in the body in an inactive (dormant) state. Shingles develops if the virus is reactivated. This can happen many years after the first (initial) exposure to VZV. It is not known what causes this virus to be reactivated. What increases the risk? People who have had chickenpox or received the chickenpox vaccine are at risk for shingles. Shingles infection is more common in people who:  Are older than age 55.  Have a weakened disease-fighting system (immune system), such as people with: ? HIV. ? AIDS. ? Cancer.  Are taking medicines that weaken the immune system, such as transplant medicines.  Are experiencing a lot of stress. What are the signs or symptoms? Early symptoms of this condition include itching, tingling, and pain in an area on your skin. Pain may be described as burning, stabbing, or throbbing. A few days or weeks after early symptoms start, a painful red rash appears. The rash is usually on one side of the body and has a band-like or belt-like pattern. The rash eventually turns into fluid-filled blisters that break open, change into scabs, and dry up in about 2-3 weeks. At any time during the infection, you may also develop:  A fever.  Chills.  A headache.  An upset stomach. How is this diagnosed? This condition is diagnosed with a skin exam. Skin or fluid samples may be taken from the blisters before a diagnosis is made. These samples are examined under a microscope or sent to a lab for testing. How is this treated? The rash may last for several weeks. There is not a  specific cure for this condition. Your health care provider will probably prescribe medicines to help you manage pain, recover more quickly, and avoid long-term problems. Medicines may include:  Antiviral drugs.  Anti-inflammatory drugs.  Pain medicines.  Anti-itching medicines (antihistamines). If the area involved is on your face, you may be referred to a specialist, such as an eye doctor (ophthalmologist) or an ear, nose, and throat (ENT) doctor (otolaryngologist) to help you avoid eye problems, chronic pain, or disability. Follow these instructions at home: Medicines  Take over-the-counter and prescription medicines only as told by your health care provider.  Apply an anti-itch cream or numbing cream to the affected area as told by your health care provider. Relieving itching and discomfort   Apply cold, wet cloths (cold compresses) to the area of the rash or blisters as told by your health care provider.  Cool baths can be soothing. Try adding baking soda or dry oatmeal to the water to reduce itching. Do not bathe in hot water. Blister and rash care  Keep your rash covered with a loose bandage (dressing). Wear loose-fitting clothing to  help ease the pain of material rubbing against the rash.  Keep your rash and blisters clean by washing the area with mild soap and cool water as told by your health care provider.  Check your rash every day for signs of infection. Check for: ? More redness, swelling, or pain. ? Fluid or blood. ? Warmth. ? Pus or a bad smell.  Do not scratch your rash or pick at your blisters. To help avoid scratching: ? Keep your fingernails clean and cut short. ? Wear gloves or mittens while you sleep, if scratching is a problem. General instructions  Rest as told by your health care provider.  Keep all follow-up visits as told by your health care provider. This is important.  Wash your hands often with soap and water. If soap and water are not  available, use hand sanitizer. Doing this lowers your chance of getting a bacterial skin infection.  Before your blisters change into scabs, your shingles infection can cause chickenpox in people who have never had it or have never been vaccinated against it. To prevent this from happening, avoid contact with other people, especially: ? Babies. ? Pregnant women. ? Children who have eczema. ? Elderly people who have transplants. ? People who have chronic illnesses, such as cancer or AIDS. Contact a health care provider if:  Your pain is not relieved with prescribed medicines.  Your pain does not get better after the rash heals.  You have signs of infection in the rash area, such as: ? More redness, swelling, or pain around the rash. ? Fluid or blood coming from the rash. ? The rash area feeling warm to the touch. ? Pus or a bad smell coming from the rash. Get help right away if:  The rash is on your face or nose.  You have facial pain, pain around your eye area, or loss of feeling on one side of your face.  You have difficulty seeing.  You have ear pain or have ringing in your ear.  You have a loss of taste.  Your condition gets worse. Summary  Shingles, which is also known as herpes zoster, is an infection that causes a painful skin rash and fluid-filled blisters.  This condition is diagnosed with a skin exam. Skin or fluid samples may be taken from the blisters and examined before the diagnosis is made.  Keep your rash covered with a loose bandage (dressing). Wear loose-fitting clothing to help ease the pain of material rubbing against the rash.  Before your blisters change into scabs, your shingles infection can cause chickenpox in people who have never had it or have never been vaccinated against it. This information is not intended to replace advice given to you by your health care provider. Make sure you discuss any questions you have with your health care provider.  Document Released: 01/27/2005 Document Revised: 05/21/2018 Document Reviewed: 10/01/2016 Elsevier Patient Education  2020 [employer].

## 2018-09-28 NOTE — Telephone Encounter (Signed)
Pt called stating she is having red blisters across her left side and left breast; she is also having extreme weakness, and sweating; her symptoms started 09/22/2018; she says that she has never had shingles before; the pt says that blisters are red, itchy, and painful (rated 6 out of 10); she has been Tylenol 500 mg taking twice daily for arthritis pain; recommendations made per nurse triage protocol; she verbalized understanding, and says that she can not take antihistamines or ibuprofen; the pt sees Dr Charlett Blake, [facility]; pt transferred to [facility] for scheduling.  Reason for Disposition . [1] Shingles rash AND [2] onset > 72 hours ago  Answer Assessment - Initial Assessment Questions 1. APPEARANCE of RASH: "Describe the rash."      Red, blistered, itching 2. LOCATION: "Where is the rash located?"      Left side and breast 3. ONSET: "When did the rash start?"    09/22/2018  4. ITCHING: "Does the rash itch?" If so, ask: "How bad is the itch?"  (Scale 1-10; or mild, moderate, severe)    moderate 5. PAIN: "Does the rash hurt?" If so, ask: "How bad is the pain?"  (Scale 1-10; or mild, moderate, severe)     6 out of 10 6. OTHER SYMPTOMS: "Do you have any other symptoms?" (e.g., fever)    Fever 98.7 on 09/27/2018 (normally 97); chills, fatigue, weakness 7. PREGNANCY: "Is there any chance you are pregnant?" "When was your last menstrual period?"     no  Protocols used: [facility]

## 2018-09-28 NOTE — Assessment & Plan Note (Signed)
Started one week ago No new lesions Pt states tylenol is good enough and does not want anything else  rto prn

## 2018-09-28 NOTE — Telephone Encounter (Signed)
Appt scheduled

## 2018-09-28 NOTE — Assessment & Plan Note (Addendum)
New dx--- start lisinopril 10 mg  F/u pcp 2 weeks or sooner prn  Check bmp then

## 2018-09-30 ENCOUNTER — Telehealth: Payer: Self-pay

## 2018-09-30 NOTE — Telephone Encounter (Signed)
I don't think that I saw pt for this recently? So would not be able to fill out paperwork without visit. Need to discuss with pt extent of her eruption. Number of days missed. Possible days she will miss in future etc. Simply just can't fill out form without documented visit. Sometimes employers ask to see corresponding note to back up what we say on form. You could get her scheduled for in office visit on this coming Monday or virtual visit on Monday.(if virtual then video so I can evaluate to area of shingles eruption.

## 2018-09-30 NOTE — Telephone Encounter (Signed)
Copied from [place] [phone]. Topic: General - Other >> Sep 30, 2018  1:43 PM Virl Axe D wrote: Reason for CRM: Pt stated she is out of work with shingles. [employer] is faxing over FMLA paperwork and are requesting it be sent back as soon as possible. Please advise.

## 2018-09-30 NOTE — Telephone Encounter (Signed)
Please schedule pt

## 2018-10-01 ENCOUNTER — Ambulatory Visit: Payer: Self-pay | Admitting: *Deleted

## 2018-10-01 ENCOUNTER — Telehealth: Payer: Self-pay | Admitting: Family Medicine

## 2018-10-01 MED ORDER — GABAPENTIN 100 MG PO CAPS: 100.0000 mg | ORAL_CAPSULE | Freq: Three times a day (TID) | ORAL | 0 refills | Status: DC

## 2018-10-01 MED FILL — GABAPENTIN 100 MG CAPSULE: 100 | 30 days supply | Qty: 90 | Fill #0

## 2018-10-01 NOTE — Telephone Encounter (Signed)
3 attempts to call pt - no answer no voicemail - pt needs appt (see below)

## 2018-10-01 NOTE — Telephone Encounter (Signed)
Patient has scheduled with Margaret [name] on the 28th

## 2018-10-01 NOTE — Telephone Encounter (Signed)
Spoke with patient she stated she doesn't want any pain meds that are strong because she reacts weirdly to pain meds and she lives alone.   Patient prefers gabapentin  Please advise

## 2018-10-01 NOTE — Telephone Encounter (Signed)
Patient returned called and was read instruction by Dr Etter Sjogren 10/01/2018. Patient verbalized understanding of all instructions.

## 2018-10-01 NOTE — Telephone Encounter (Signed)
Summary: shingles pain    Pt called and stated that she was diagnosed with shingles. Pt states that he pain is much worse and would like something called in or suggestions. Please advise.      Spoke with patient- patient had been doing well with tylenol. Patient is calling with severe pain- she is not on any medications for this now.  Reason for Disposition . SEVERE pain (e.g., excruciating)  Answer Assessment - Initial Assessment Questions 1. APPEARANCE of RASH: "Describe the rash."      Patient reports the rash is exactly the same 2. LOCATION: "Where is the rash located?"      Under left armpit 3. ONSET: "When did the rash start?"      All week 4. ITCHING: "Does the rash itch?" If so, ask: "How bad is the itch?"  (Scale 1-10; or mild, moderate, severe)     no 5. PAIN: "Does the rash hurt?" If so, ask: "How bad is the pain?"  (Scale 1-10; or mild, moderate, severe)     10- patient is having sharp pain at shingle site- she states it undescribable.  6. OTHER SYMPTOMS: "Do you have any other symptoms?" (e.g., fever)     Nausea and sweats 7. PREGNANCY: "Is there any chance you are pregnant?" "When was your last menstrual period?"     n/a  Protocols used: [facility]

## 2018-10-01 NOTE — Telephone Encounter (Signed)
Per Dr. Etter Sjogren: "gabepentin 100mg  1 po tid #90 --- but tell her to take 1 at night for a few nights then bid for a few nights then tid"  Spoke with patient and let her know about medication sent to pharmacy.

## 2018-10-04 ENCOUNTER — Inpatient Hospital Stay (HOSPITAL_BASED_OUTPATIENT_CLINIC_OR_DEPARTMENT_OTHER): Admission: RE | Admit: 2018-10-04 | Payer: No Typology Code available for payment source | Source: Ambulatory Visit

## 2018-10-04 NOTE — Telephone Encounter (Signed)
Pt is requesting a call back from [place] to discuss paperwork. Pt says that her paperwork has to be turned in today or it will be null in void.   CB: [phone]

## 2018-10-05 ENCOUNTER — Telehealth: Payer: Self-pay | Admitting: Family Medicine

## 2018-10-05 NOTE — Telephone Encounter (Signed)
°  Patient states she received 2 calls from [employer] today informing her FMLA paperwork was not received and if its not it will be voided, patient seeking a following up call today. Informed patient nurse / pcp are currently in clinic and will follow up, please advise  Copied from [place] [id number]. Topic: General - Other >> Oct 05, 2018 12:00 PM Rainey Pines A wrote: Patient would like a callback today in regards to an update if her fmla paperwork was faxed over in time to [employer].

## 2018-10-07 ENCOUNTER — Telehealth: Payer: Self-pay

## 2018-10-07 DIAGNOSIS — Z0279 Encounter for issue of other medical certificate: Secondary | ICD-10-CM

## 2018-10-07 NOTE — Telephone Encounter (Signed)
Received FMLA forms, completed them. PCP has signed them and they have been faxed with confirmation. Copy sent to scan and patient. Copy also placed in [facility] blue folder.

## 2018-10-08 ENCOUNTER — Other Ambulatory Visit: Payer: Self-pay

## 2018-10-08 ENCOUNTER — Ambulatory Visit (INDEPENDENT_AMBULATORY_CARE_PROVIDER_SITE_OTHER): Payer: No Typology Code available for payment source | Admitting: Medical

## 2018-10-08 ENCOUNTER — Encounter: Payer: Self-pay | Admitting: Medical

## 2018-10-08 VITALS — BP 137/81 | HR 80 | Temp 96.2°F | Resp 16 | Ht 67.0 in | Wt 207.8 lb

## 2018-10-08 DIAGNOSIS — B029 Zoster without complications: Secondary | ICD-10-CM | POA: Diagnosis not present

## 2018-10-08 DIAGNOSIS — H9313 Tinnitus, bilateral: Secondary | ICD-10-CM | POA: Diagnosis not present

## 2018-10-08 DIAGNOSIS — R030 Elevated blood-pressure reading, without diagnosis of hypertension: Secondary | ICD-10-CM

## 2018-10-08 DIAGNOSIS — R5383 Other fatigue: Secondary | ICD-10-CM | POA: Diagnosis not present

## 2018-10-08 DIAGNOSIS — R110 Nausea: Secondary | ICD-10-CM

## 2018-10-08 NOTE — Patient Instructions (Addendum)
You appear to be recovering well from shingles despite not being on antiviral medication.  In addition your pain is very minimal.  Recommend only using Tylenol presently I do not think that you need a medication for postherpetic neuralgia.  If pain worsens or changes let me know.  Your blood pressure is borderline elevated today but not as high as on last visit.  You could not tolerate lisinopril.  Do recommend that you get blood pressure cuff over-the-counter and check blood pressure daily over the next week.  Confirm blood pressure readings less than 140/90.  You had some tinnitus recently that you associate with drinking caffeine beverages.  This might be associated with elevated blood pressure.  Continue to avoid caffeine beverages but let me know if you get tinnitus despite caffeine restrictions.  Your recent fatigue and nausea seem to coincide with shingles.  Both symptoms are minimal and your work-up for fatigue was negative recently.  If symptoms worsen or change then let me know.  Could offered you medication for nausea but declined today.  Follow-up as regular scheduled with PCP or as needed.

## 2018-10-08 NOTE — Progress Notes (Signed)
Subjective:    Patient ID: Margaret [name], female    DOB: 10-26-1950, 68 y.o.   MRN: [id number]  HPI  Patient here for 2 week follow-up - she was diagnosed with hypertension and shingles. 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  Reports shingles are crusting and she is only having to take tylenol for the pain. She is still feeling some of the fatigue and weakness that started with the shingles, but states it is gradually improving. Pt never took antiviral due to coming in too late.  Recent labs were negative which covers work up for fatigue.  Reports "waves of nausea" 5-6 times per day lasting about 2 minutes each. No other symptoms associated with these episodes. This also initially started around the same time of shingles. Not with pain but seems to start around shingles dx. No abdomen pain and no vomiting.  Patient states she was given a prescription for lisinopril but after one dose and reports that she felt like she was going to pass out. States her body doesn't always respond well to medications and she often has extreme effects. She has not taken anymore lisinopril since then and has not been checking her BP at her home. BP today is 137/81. Denies headaches, but reports ringing in her ears - worse after caffeine. She feels like caffeine has recently started making her panicky. Pt does not have bp cuff at home.  Patient states she eats a log of processed foods because she has a hard time cooking due to eczema on her hands. She is aware that she should be limiting sodium intake. Not currently exercises due to weakness from shingles. She works 8 hours a day at a [employer]. She wants to start walking more regularly once her energy level is up and the weather begins cooling down.   Some recent ringing of ears which she notes seems to occur with caffeine intake.    Review of Systems  Constitutional: Positive for fatigue. Negative for chills and fever.  HENT: Negative for ear  pain and tinnitus.        Yesterday had ringing in her ears that can last hours a few times a day if she drinks caffeine  Eyes: Negative for visual disturbance.  Respiratory: Negative for chest tightness and shortness of breath.   Cardiovascular: Negative for chest pain.  Gastrointestinal: Negative for abdominal pain, constipation and diarrhea.  Genitourinary: Negative for dysuria and hematuria.  Musculoskeletal: Positive for back pain.       Back pain on the same dermatome as the shingles  Skin: Positive for rash.       Shingles diagnosed 2 weeks ago, now crusting, sharp pains off and on,pain improves with tylenol  Neurological: Negative for dizziness, light-headedness and headaches.  Psychiatric/Behavioral: The patient is not nervous/anxious.     Past Medical History:  Diagnosis Date  . Anxiety   . Arthritis 02/14/2013  . Bipolar disorder ([place])   . Esophageal reflux 09/19/2012   only slight with gallbladder problems  . Essential hypertension 09/28/2018  . Hypoglycemia   . Hypothyroidism   . Other malaise and fatigue 09/19/2012  . PONV (postoperative nausea and vomiting)   . Preventative health care 06/09/2016  . PTSD (post-traumatic stress disorder)   . Thyroid disease      Social History   Socioeconomic History  . Marital status: Divorced    Spouse name: Not on file  . Number of children: Not on file  . Years of  education: Not on file  . Highest education level: Not on file  Occupational History  . Not on file  Social Needs  . Financial resource strain: Not on file  . Food insecurity    Worry: Not on file    Inability: Not on file  . Transportation needs    Medical: Not on file    Non-medical: Not on file  Tobacco Use  . Smoking status: Former Smoker    Packs/day: 3.00    Years: 24.00    Pack years: 72.00    Types: Cigarettes    Quit date: 1995    Years since quitting: 25.6  . Smokeless tobacco: Never Used  Substance and Sexual Activity  . Alcohol use: No     Alcohol/week: 0.0 standard drinks  . Drug use: No  . Sexual activity: Not on file  Lifestyle  . Physical activity    Days per week: Not on file    Minutes per session: Not on file  . Stress: Not on file  Relationships  . Social Herbalist on phone: Not on file    Gets together: Not on file    Attends religious service: Not on file    Active member of club or organization: Not on file    Attends meetings of clubs or organizations: Not on file    Relationship status: Not on file  . Intimate partner violence    Fear of current or ex partner: Not on file    Emotionally abused: Not on file    Physically abused: Not on file    Forced sexual activity: Not on file  Other Topics Concern  . Not on file  Social History Narrative  . Not on file    Past Surgical History:  Procedure Laterality Date  . CHOLECYSTECTOMY N/A 09/01/2016   Procedure: LAPAROSCOPIC CHOLECYSTECTOMY WITH INTRAOPERATIVE CHOLANGIOGRAM;  Surgeon: Jovita Kussmaul, MD;  Location: [facility];  Service: General;  Laterality: N/A;  . 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 fooet surgery Left 2014   reconstruction of foot and ankle to correct deformity  . NECK SURGERY    . OTHER SURGICAL HISTORY    . OTHER SURGICAL HISTORY    . OTHER SURGICAL HISTORY    . TUBAL LIGATION     reversal    Family History  Problem Relation Age of Onset  . Diabetes Mother        Brother 1 of 2  . Heart failure Mother   . Prostate cancer Father   . Parkinsonism Father        deceased  . Hypertension Brother   . Esophageal cancer Maternal Grandmother   . Cancer Paternal Grandmother        cancer of jawbone 1 of 2  . Melanoma Brother   . Breast cancer Neg Hx   . Colon cancer Neg Hx     Allergies  Allergen Reactions  . Novocain [Procaine] Other (See Comments)    Heart race  . Codeine Nausea Only  . Darvon Other (See Comments)    Hallucinations   . Sulfa Drugs Cross Reactors Nausea And Vomiting    Current Outpatient Medications on  File Prior to Visit  Medication Sig Dispense Refill  . amitriptyline (ELAVIL) 50 MG tablet Take 1 tablet (50 mg total) by mouth at bedtime. 90 tablet 1  . atorvastatin (LIPITOR) 10 MG tablet Take 1 tablet (10 mg total) by mouth daily. 90 tablet 3  .  carbamazepine (CARBATROL) 200 MG 12 hr capsule Take 1 capsule (200 mg total) by mouth 2 (two) times daily. 180 capsule 1  . Clobetasol Propionate 0.05 % lotion Apply 1 application topically 2 (two) times daily. 118 mL 1  . gabapentin (NEURONTIN) 100 MG capsule Take 1 capsule (100 mg total) by mouth 3 (three) times daily. 90 capsule 0  . levothyroxine (SYNTHROID) 112 MCG tablet Take 1 tablet (112 mcg total) by mouth daily before breakfast. 90 tablet 1  . lisinopril (ZESTRIL) 10 MG tablet Take 1 tablet (10 mg total) by mouth daily. 90 tablet 3  . pimecrolimus (ELIDEL) 1 % cream Apply topically 2 (two) times daily. 30 g 0  . ranitidine (ZANTAC) 150 MG capsule Take 1 capsule (150 mg total) by mouth 2 (two) times daily. 60 capsule 0  . risperiDONE (RISPERDAL) 2 MG tablet Take 1 tablet (2 mg total) by mouth at bedtime. 90 tablet 1  . Vitamin D, Ergocalciferol, (DRISDOL) 1.25 MG (50000 UT) CAPS capsule Take 1 capsule (50,000 Units total) by mouth every 7 (seven) days. 4 capsule 4   No current facility-administered medications on file prior to visit.     BP 137/81   Pulse 80   Temp (!) 96.2 F (35.7 C) (Temporal)   Resp 16   Ht 5\' 7"  (1.702 m)   Wt 207 lb 12.8 oz (94.3 kg)   SpO2 98%   BMI 32.55 kg/m       Objective:   Physical Exam  General Mental Status- Alert. General Appearance- Not in acute distress.   Skin General: Color- Normal Color. Moisture- Normal Moisture.  Neck Carotid Arteries- Normal color. Moisture- Normal Moisture. No carotid bruits. No JVD.  Chest and Lung Exam Auscultation: Breath Sounds:-Normal.  Cardiovascular Auscultation:Rythm- Regular. Murmurs & Other Heart Sounds:Auscultation of the heart reveals- No  Murmurs.  Abdomen Inspection:-Inspeection Normal. Palpation/Percussion:Note:No mass. Palpation and Percussion of the abdomen reveal- Non Tender, Non Distended + BS, no rebound or guarding.    Neurologic Cranial Nerve exam:- CN III-XII intact(No nystagmus), symmetric smile. Strength:- 5/5 equal and symmetric strength both upper and lower extremities.    HEENT Head- Normal. Ear Auditory Canal - Left- Normal. Right -small amount wax blocking view tm Normal.Tympanic Membrane- Left- Normal. Right- tm not seen due to wax Eye Sclera/Conjunctiva- Left- Normal. Right- Normal. Nose & Sinuses Nasal Mucosa- Left-  Boggy and Congested. Right-  Boggy and  Congested.Bilateral maxillary and frontal sinus pressure. Mouth & Throat Lips: Upper Lip- Normal: no dryness, cracking, pallor, cyanosis, or vesicular eruption. Lower Lip-Normal: no dryness, cracking, pallor, cyanosis or vesicular eruption. Buccal Mucosa- Bilateral- No Aphthous ulcers. Oropharynx- No Discharge or Erythema. Tonsils: Characteristics- Bilateral- No Erythema or Congestion. Size/Enlargement- Bilateral- No enlargement. Discharge- bilateral-None.  Skin- left upper axillary area. Dried up shingles lesions no dc. No induration. Area mild tender/sensitive.    .      Assessment & Plan:  You appear to be recovering well from shingles despite not being on antiviral medication.  In addition your pain is very minimal.  Recommend only using Tylenol presently I do not think that you need a medication for postherpetic neuralgia.  If pain worsens or changes let me know.  Your blood pressure is borderline elevated today but not as high as on last visit.  You could not tolerate lisinopril.  Do recommend that you get blood pressure cuff over-the-counter and check blood pressure daily over the next week.  Confirm blood pressure readings less than 140/90.  You had  some tinnitus recently that you associate with drinking caffeine beverages.  This  might be associated with elevated blood pressure.  Continue to avoid caffeine beverages but let me know if you get tinnitus despite caffeine restrictions.  Your recent fatigue and nausea seem to coincide with shingles.  Both symptoms are minimal and your work-up for fatigue was negative recently.  If symptoms worsen or change then let me know.  Could offered you medication for nausea but declined today.  Follow-up as regular scheduled with PCP or as needed.  25+ minutes spent with pt. 50% of time spent counseling on plan going forwared.  Mackie Pai, PA-C

## 2018-10-12 NOTE — Telephone Encounter (Signed)
Paperwork completed and faxed to appropriate place

## 2018-10-12 NOTE — Telephone Encounter (Signed)
Patients forms where faxed over 7 days ago

## 2018-11-17 ENCOUNTER — Telehealth: Payer: Self-pay

## 2018-11-17 NOTE — Telephone Encounter (Signed)
Copied from [place] [id number]. Topic: Referral - Status >> Nov 17, 2018  8:55 AM Berneta Levins wrote: Reason for CRM:  Juliann Pulse from [facility] calling to report that pt did not show for appointment today.  Per Juliann Pulse when a pt doesn't show for their first appointment they do not reschedule patient. Juliann Pulse can be reached at [phone]

## 2018-11-30 MED FILL — AMITRIPTYLINE HCL 50 MG TAB: 50 | 90 days supply | Qty: 90 | Fill #0

## 2018-11-30 MED FILL — risperiDONE 2 MG TABS: 2 | 90 days supply | Qty: 90 | Fill #0

## 2018-12-22 MED FILL — LEVOTHYROXINE 112 MCG TAB: 112 | 90 days supply | Qty: 90 | Fill #1

## 2018-12-29 MED FILL — CARBATROL 200 MG CAPSULE SA: 200 | 90 days supply | Qty: 180 | Fill #1

## 2019-02-28 MED FILL — risperiDONE 2 MG TABS: 2 | 90 days supply | Qty: 90 | Fill #1

## 2019-02-28 MED FILL — AMITRIPTYLINE HCL 50 MG TAB: 50 | 90 days supply | Qty: 90 | Fill #1

## 2019-03-21 ENCOUNTER — Other Ambulatory Visit: Payer: Self-pay | Admitting: Family Medicine

## 2019-03-21 ENCOUNTER — Ambulatory Visit: Payer: No Typology Code available for payment source | Admitting: Family Medicine

## 2019-03-21 MED FILL — LEVOTHYROXINE SODIUM 112 MC: 112 | 90 days supply | Qty: 90 | Fill #0

## 2019-03-23 ENCOUNTER — Ambulatory Visit (HOSPITAL_COMMUNITY): Payer: No Typology Code available for payment source | Admitting: Psychiatry

## 2019-03-23 ENCOUNTER — Other Ambulatory Visit: Payer: Self-pay

## 2019-03-24 ENCOUNTER — Other Ambulatory Visit: Payer: Self-pay | Admitting: Family Medicine

## 2019-03-24 ENCOUNTER — Other Ambulatory Visit: Payer: Self-pay

## 2019-03-24 ENCOUNTER — Ambulatory Visit (INDEPENDENT_AMBULATORY_CARE_PROVIDER_SITE_OTHER): Payer: No Typology Code available for payment source | Admitting: Family Medicine

## 2019-03-24 VITALS — BP 154/73 | HR 88 | Temp 96.5°F | Resp 16 | Ht 66.5 in | Wt 208.0 lb

## 2019-03-24 DIAGNOSIS — Z0000 Encounter for general adult medical examination without abnormal findings: Secondary | ICD-10-CM

## 2019-03-24 DIAGNOSIS — E785 Hyperlipidemia, unspecified: Secondary | ICD-10-CM | POA: Diagnosis not present

## 2019-03-24 DIAGNOSIS — L309 Dermatitis, unspecified: Secondary | ICD-10-CM

## 2019-03-24 DIAGNOSIS — Z1239 Encounter for other screening for malignant neoplasm of breast: Secondary | ICD-10-CM | POA: Diagnosis not present

## 2019-03-24 DIAGNOSIS — R739 Hyperglycemia, unspecified: Secondary | ICD-10-CM | POA: Diagnosis not present

## 2019-03-24 DIAGNOSIS — N289 Disorder of kidney and ureter, unspecified: Secondary | ICD-10-CM | POA: Diagnosis not present

## 2019-03-24 DIAGNOSIS — F319 Bipolar disorder, unspecified: Secondary | ICD-10-CM

## 2019-03-24 DIAGNOSIS — I10 Essential (primary) hypertension: Secondary | ICD-10-CM

## 2019-03-24 DIAGNOSIS — E039 Hypothyroidism, unspecified: Secondary | ICD-10-CM

## 2019-03-24 LAB — CBC
HCT: 43.9 % (ref 36.0–46.0)
Hemoglobin: 15.1 g/dL — ABNORMAL HIGH (ref 12.0–15.0)
MCHC: 34.4 g/dL (ref 30.0–36.0)
MCV: 97 fl (ref 78.0–100.0)
Platelets: 243 10*3/uL (ref 150.0–400.0)
RBC: 4.53 Mil/uL (ref 3.87–5.11)
RDW: 13 % (ref 11.5–15.5)
WBC: 5.9 10*3/uL (ref 4.0–10.5)

## 2019-03-24 LAB — COMPREHENSIVE METABOLIC PANEL
ALT: 13 U/L (ref 0–35)
AST: 14 U/L (ref 0–37)
Albumin: 4.3 g/dL (ref 3.5–5.2)
Alkaline Phosphatase: 95 U/L (ref 39–117)
BUN: 18 mg/dL (ref 6–23)
CO2: 28 mEq/L (ref 19–32)
Calcium: 9.4 mg/dL (ref 8.4–10.5)
Chloride: 105 mEq/L (ref 96–112)
Creatinine, Ser: 0.98 mg/dL (ref 0.40–1.20)
GFR: 56.35 mL/min — ABNORMAL LOW (ref >60.00)
Glucose, Bld: 113 mg/dL — ABNORMAL HIGH (ref 70–99)
Potassium: 4.4 mEq/L (ref 3.5–5.1)
Sodium: 141 mEq/L (ref 135–145)
Total Bilirubin: 0.3 mg/dL (ref 0.2–1.2)
Total Protein: 6.4 g/dL (ref 6.0–8.3)

## 2019-03-24 LAB — HEMOGLOBIN A1C: Hgb A1c MFr Bld: 6 % (ref 4.6–6.5)

## 2019-03-24 LAB — LIPID PANEL
Cholesterol: 226 mg/dL — ABNORMAL HIGH (ref 0–200)
HDL: 44.3 mg/dL (ref >39.00)
Total CHOL/HDL Ratio: 5
Triglycerides: 614 mg/dL — ABNORMAL HIGH (ref 0.0–149.0)

## 2019-03-24 LAB — TSH: TSH: 6.7 u[IU]/mL — ABNORMAL HIGH (ref 0.35–4.50)

## 2019-03-24 LAB — LDL CHOLESTEROL, DIRECT: Direct LDL: 131 mg/dL

## 2019-03-24 MED ORDER — LEVOTHYROXINE SODIUM 125 MCG PO TABS: 125.0000 ug | ORAL_TABLET | Freq: Every day | ORAL | 1 refills | Status: DC

## 2019-03-24 MED FILL — LEVOTHYROXINE SODIUM 125 MC: 125 | 90 days supply | Qty: 90 | Fill #0

## 2019-03-24 NOTE — Progress Notes (Signed)
Subjective:    Patient ID: Margaret [name], female    DOB: Oct 03, 1950, 69 y.o.   MRN: [id number]  Chief Complaint  Patient presents with  . Annual Exam    HPI Patient is in today for annual preventative exam and follow up on chronic medical concerns. She has been struggling with increased fatigue. No recent febrile illness or hospitalizations. She has been struggling with stress and lost her 60 year old dog and 3 close friends. 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 she has gotten two new pitt bull puppies that are 30 months old and they are keeping her busy so her fatigue has been some better. She is trying to stay active. Denies CP/palp/SOB/HA/congestion/fevers/GI or GU c/o. Taking meds as prescribed  Past Medical History:  Diagnosis Date  . Anxiety   . Arthritis 02/14/2013  . Bipolar disorder ([place])   . Esophageal reflux 09/19/2012   only slight with gallbladder problems  . Essential hypertension 09/28/2018  . Hypoglycemia   . Hypothyroidism   . Other malaise and fatigue 09/19/2012  . PONV (postoperative nausea and vomiting)   . Preventative health care 06/09/2016  . PTSD (post-traumatic stress disorder)   . Thyroid disease     Past Surgical History:  Procedure Laterality Date  . CHOLECYSTECTOMY N/A 09/01/2016   Procedure: LAPAROSCOPIC CHOLECYSTECTOMY WITH INTRAOPERATIVE CHOLANGIOGRAM;  Surgeon: Jovita Kussmaul, MD;  Location: [facility];  Service: General;  Laterality: N/A;  . 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 fooet surgery Left 2014   reconstruction of foot and ankle to correct deformity  . NECK SURGERY    . OTHER SURGICAL HISTORY    . OTHER SURGICAL HISTORY    . OTHER SURGICAL HISTORY    . TUBAL LIGATION     reversal    Family History  Problem Relation Age of Onset  . Diabetes Mother        Brother 1 of 2  . Heart failure Mother   . Prostate cancer Father   . Parkinsonism Father        deceased  . Hypertension Brother   . Esophageal  cancer Maternal Grandmother   . Cancer Paternal Grandmother        cancer of jawbone 1 of 2  . Melanoma Brother   . Breast cancer Neg Hx   . Colon cancer Neg Hx     Social History   Socioeconomic History  . Marital status: Divorced    Spouse name: Not on file  . Number of children: Not on file  . Years of education: Not on file  . Highest education level: Not on file  Occupational History  . Not on file  Tobacco Use  . Smoking status: Former Smoker    Packs/day: 3.00    Years: 24.00    Pack years: 72.00    Types: Cigarettes    Quit date: 1995    Years since quitting: 26.1  . Smokeless tobacco: Never Used  Substance and Sexual Activity  . Alcohol use: No    Alcohol/week: 0.0 standard drinks  . Drug use: No  . Sexual activity: Not on file  Other Topics Concern  . Not on file  Social History Narrative  . Not on file   Social Determinants of Health   Financial Resource Strain:   . Difficulty of Paying Living Expenses: Not on file  Food Insecurity:   . Worried About Charity fundraiser  in the Last Year: Not on file  . Ran Out of Food in the Last Year: Not on file  Transportation Needs:   . Lack of Transportation (Medical): Not on file  . Lack of Transportation (Non-Medical): Not on file  Physical Activity:   . Days of Exercise per Week: Not on file  . Minutes of Exercise per Session: Not on file  Stress:   . Feeling of Stress : Not on file  Social Connections:   . Frequency of Communication with Friends and Family: Not on file  . Frequency of Social Gatherings with Friends and Family: Not on file  . Attends Religious Services: Not on file  . Active Member of Clubs or Organizations: Not on file  . Attends Archivist Meetings: Not on file  . Marital Status: Not on file  Intimate Partner Violence:   . Fear of Current or Ex-Partner: Not on file  . Emotionally Abused: Not on file  . Physically Abused: Not on file  . Sexually Abused: Not on file     Outpatient Medications Prior to Visit  Medication Sig Dispense Refill  . amitriptyline (ELAVIL) 50 MG tablet Take 1 tablet (50 mg total) by mouth at bedtime. 90 tablet 1  . carbamazepine (CARBATROL) 200 MG 12 hr capsule Take 1 capsule (200 mg total) by mouth 2 (two) times daily. 180 capsule 1  . Clobetasol Propionate 0.05 % lotion Apply 1 application topically 2 (two) times daily. 118 mL 1  . risperiDONE (RISPERDAL) 2 MG tablet Take 1 tablet (2 mg total) by mouth at bedtime. 90 tablet 1  . levothyroxine (SYNTHROID) 112 MCG tablet TAKE 1 TABLET (112 MCG TOTAL) BY MOUTH DAILY BEFORE BREAKFAST. 90 tablet 1  . atorvastatin (LIPITOR) 10 MG tablet Take 1 tablet (10 mg total) by mouth daily. (Patient not taking: Reported on 03/24/2019) 90 tablet 3  . lisinopril (ZESTRIL) 10 MG tablet Take 1 tablet (10 mg total) by mouth daily. (Patient not taking: Reported on 03/24/2019) 90 tablet 3  . gabapentin (NEURONTIN) 100 MG capsule Take 1 capsule (100 mg total) by mouth 3 (three) times daily. 90 capsule 0  . pimecrolimus (ELIDEL) 1 % cream Apply topically 2 (two) times daily. 30 g 0  . ranitidine (ZANTAC) 150 MG capsule Take 1 capsule (150 mg total) by mouth 2 (two) times daily. 60 capsule 0  . Vitamin D, Ergocalciferol, (DRISDOL) 1.25 MG (50000 UT) CAPS capsule Take 1 capsule (50,000 Units total) by mouth every 7 (seven) days. 4 capsule 4   No facility-administered medications prior to visit.    Allergies  Allergen Reactions  . Novocain [Procaine] Other (See Comments)    Heart race  . Codeine Nausea Only  . Darvon Other (See Comments)    Hallucinations   . Sulfa Drugs Cross Reactors Nausea And Vomiting    Review of Systems  Constitutional: Positive for malaise/fatigue. Negative for chills and fever.  HENT: Negative for congestion and hearing loss.   Eyes: Negative for discharge.  Respiratory: Negative for cough, sputum production and shortness of breath.   Cardiovascular: Negative for chest pain,  palpitations and leg swelling.  Gastrointestinal: Negative for abdominal pain, blood in stool, constipation, diarrhea, heartburn, nausea and vomiting.  Genitourinary: Negative for dysuria, frequency, hematuria and urgency.  Musculoskeletal: Negative for back pain, falls and myalgias.  Skin: Negative for rash.  Neurological: Negative for dizziness, sensory change, loss of consciousness, weakness and headaches.  Endo/Heme/Allergies: Negative for environmental allergies. Does not bruise/bleed easily.  Psychiatric/Behavioral: Negative for depression and suicidal ideas. The patient is not nervous/anxious and does not have insomnia.        Objective:    Physical Exam Constitutional:      General: She is not in acute distress.    Appearance: She is not diaphoretic.  HENT:     Head: Normocephalic and atraumatic.     Right Ear: External ear normal.     Left Ear: External ear normal.     Nose: Nose normal.     Mouth/Throat:     Pharynx: No oropharyngeal exudate.  Eyes:     General: No scleral icterus.       Right eye: No discharge.        Left eye: No discharge.     Conjunctiva/sclera: Conjunctivae normal.     Pupils: Pupils are equal, round, and reactive to light.  Neck:     Thyroid: No thyromegaly.  Cardiovascular:     Rate and Rhythm: Normal rate and regular rhythm.     Heart sounds: Normal heart sounds. No murmur.  Pulmonary:     Effort: Pulmonary effort is normal. No respiratory distress.     Breath sounds: Normal breath sounds. No wheezing or rales.  Abdominal:     General: Bowel sounds are normal. There is no distension.     Palpations: Abdomen is soft. There is no mass.     Tenderness: There is no abdominal tenderness.  Musculoskeletal:        General: No tenderness. Normal range of motion.     Cervical back: Normal range of motion and neck supple.  Lymphadenopathy:     Cervical: No cervical adenopathy.  Skin:    General: Skin is warm and dry.     Findings: No rash.   Neurological:     Mental Status: She is alert and oriented to person, place, and time.     Cranial Nerves: No cranial nerve deficit.     Coordination: Coordination normal.     Deep Tendon Reflexes: Reflexes are normal and symmetric. Reflexes normal.     BP (!) 154/73 (BP Location: Right Arm, Patient Position: Sitting, Cuff Size: Small)   Pulse 88   Temp (!) 96.5 F (35.8 C) (Temporal)   Resp 16   Ht 5' 6.5" (1.689 m)   Wt 208 lb (94.3 kg)   SpO2 100%   BMI 33.07 kg/m  Wt Readings from Last 3 Encounters:  03/24/19 208 lb (94.3 kg)  10/08/18 207 lb 12.8 oz (94.3 kg)  09/28/18 207 lb 6.4 oz (94.1 kg)    Diabetic Foot Exam - Simple   No data filed     Lab Results  Component Value Date   WBC 5.9 03/24/2019   HGB 15.1 (H) 03/24/2019   HCT 43.9 03/24/2019   PLT 243.0 03/24/2019   GLUCOSE 113 (H) 03/24/2019   CHOL 226 (H) 03/24/2019   TRIG (H) 03/24/2019    614.0 Triglyceride is over 400; calculations on Lipids are invalid.   HDL 44.30 03/24/2019   LDLDIRECT 131.0 03/24/2019   LDLCALC 118 (H) 03/16/2017   ALT 13 03/24/2019   AST 14 03/24/2019   NA 141 03/24/2019   K 4.4 03/24/2019   CL 105 03/24/2019   CREATININE 0.98 03/24/2019   BUN 18 03/24/2019   CO2 28 03/24/2019   TSH 6.70 (H) 03/24/2019   HGBA1C 6.0 03/24/2019    Lab Results  Component Value Date   TSH 6.70 (H) 03/24/2019   Lab  Results  Component Value Date   WBC 5.9 03/24/2019   HGB 15.1 (H) 03/24/2019   HCT 43.9 03/24/2019   MCV 97.0 03/24/2019   PLT 243.0 03/24/2019   Lab Results  Component Value Date   NA 141 03/24/2019   K 4.4 03/24/2019   CO2 28 03/24/2019   GLUCOSE 113 (H) 03/24/2019   BUN 18 03/24/2019   CREATININE 0.98 03/24/2019   BILITOT 0.3 03/24/2019   ALKPHOS 95 03/24/2019   AST 14 03/24/2019   ALT 13 03/24/2019   PROT 6.4 03/24/2019   ALBUMIN 4.3 03/24/2019   CALCIUM 9.4 03/24/2019   ANIONGAP 4 (L) 08/26/2016   GFR 56.35 (L) 03/24/2019   Lab Results  Component Value  Date   CHOL 226 (H) 03/24/2019   Lab Results  Component Value Date   HDL 44.30 03/24/2019   Lab Results  Component Value Date   LDLCALC 118 (H) 03/16/2017   Lab Results  Component Value Date   TRIG (H) 03/24/2019    614.0 Triglyceride is over 400; calculations on Lipids are invalid.   Lab Results  Component Value Date   CHOLHDL 5 03/24/2019   Lab Results  Component Value Date   HGBA1C 6.0 03/24/2019       Assessment & Plan:   Problem List Items Addressed This Visit    Hypothyroidism    On Levothyroxine, continue to monitor      Bipolar disorder [facility])    She has been stressed as she lost her 64 yo dog and 3 friends. She was noting significant fatigue but that is better, no changes      Hyperlipidemia    Tolerating statin, encouraged heart healthy diet, avoid trans fats, minimize simple carbs and saturated fats. Increase exercise as tolerated      Relevant Orders   Lipid panel (Completed)   Hyperglycemia    hgba1c acceptable, minimize simple carbs. Increase exercise as tolerated.       Relevant Orders   Hemoglobin A1c (Completed)   Renal insufficiency    Hydrate and monitor      Relevant Orders   Comprehensive metabolic panel (Completed)   Preventative health care    Patient encouraged to maintain heart healthy diet, regular exercise, adequate sleep. Consider daily probiotics. Take medications as prescribed. Labs ordered and reviewed. MGM ordered. She declines pap smear and colonoscopy      Eczema    Declines any steroid creams. Her hands are very itchy and with rash. Call if she decides she wants creams.      Essential hypertension    Well controlled, no changes to meds. Encouraged heart healthy diet such as the DASH diet and exercise as tolerated.       Relevant Orders   CBC (Completed)   TSH (Completed)    Other Visit Diagnoses    Encounter for screening for malignant neoplasm of breast, unspecified screening modality    -  Primary   Relevant  Orders   MM 3D SCREEN BREAST BILATERAL      I have discontinued Jahniyah Caseres. Larke's ranitidine, pimecrolimus, Vitamin D (Ergocalciferol), and gabapentin. I am also having her maintain her Clobetasol Propionate, risperiDONE, carbamazepine, amitriptyline, atorvastatin, and lisinopril.  No orders of the defined types were placed in this encounter.    Penni Homans, MD

## 2019-03-24 NOTE — Assessment & Plan Note (Signed)
Declines any steroid creams. Her hands are very itchy and with rash. Call if she decides she wants creams.

## 2019-03-24 NOTE — Assessment & Plan Note (Signed)
hgba1c acceptable, minimize simple carbs. Increase exercise as tolerated.  

## 2019-03-24 NOTE — Assessment & Plan Note (Addendum)
Patient encouraged to maintain heart healthy diet, regular exercise, adequate sleep. Consider daily probiotics. Take medications as prescribed. Labs ordered and reviewed. MGM ordered. She declines pap smear and colonoscopy

## 2019-03-24 NOTE — Assessment & Plan Note (Signed)
Well controlled, no changes to meds. Encouraged heart healthy diet such as the DASH diet and exercise as tolerated.  °

## 2019-03-24 NOTE — Assessment & Plan Note (Addendum)
She has not taken her Atorvastatin and her numbers are up so she will need to try to take a daily statin. encouraged heart healthy diet, avoid trans fats, minimize simple carbs and saturated fats. Increase exercise as tolerated

## 2019-03-24 NOTE — Assessment & Plan Note (Signed)
Hydrate and monitor 

## 2019-03-24 NOTE — Assessment & Plan Note (Addendum)
On Levothyroxine, TSH was elevated increase Levothyroxine to 125 mcg daily

## 2019-03-24 NOTE — Patient Instructions (Addendum)
Omron Blood Pressure cuff, upper arm, want BP 100-140/60-90 Pulse oximeter, want oxygen in 90s  Weekly vitals  Take Multivitamin with minerals, selenium Vitamin D 1000-2000 IU daily Probiotic with lactobacillus and bifidophilus Asprin EC 81 mg daily  Melatonin 2-5 mg at bedtime  [email or web address]   Benefiber once to twice daily for diarrhea  Cotton gloves moist  Shignrix is the new shingles shots. Two shots over 2-6 months start series 45-60 days after Covid shots completed  Preventive Care 65 Years and Older, Female Preventive care refers to lifestyle choices and visits with your health care provider that can promote health and wellness. This includes:  A yearly physical exam. This is also called an annual well check.  Regular dental and eye exams.  Immunizations.  Screening for certain conditions.  Healthy lifestyle choices, such as diet and exercise. What can I expect for my preventive care visit? Physical exam Your health care provider will check:  Height and weight. These may be used to calculate body mass index (BMI), which is a measurement that tells if you are at a healthy weight.  Heart rate and blood pressure.  Your skin for abnormal spots. Counseling Your health care provider may ask you questions about:  Alcohol, tobacco, and drug use.  Emotional well-being.  Home and relationship well-being.  Sexual activity.  Eating habits.  History of falls.  Memory and ability to understand (cognition).  Work and work Statistician.  Pregnancy and menstrual history. What immunizations do I need?  Influenza (flu) vaccine  This is recommended every year. Tetanus, diphtheria, and pertussis (Tdap) vaccine  You may need a Td booster every 10 years. Varicella (chickenpox) vaccine  You may need this vaccine if you have not already been vaccinated. Zoster (shingles) vaccine  You may need this after age  35. Pneumococcal conjugate (PCV13) vaccine  One dose is recommended after age 45. Pneumococcal polysaccharide (PPSV23) vaccine  One dose is recommended after age 70. Measles, mumps, and rubella (MMR) vaccine  You may need at least one dose of MMR if you were born in 1957 or later. You may also need a second dose. Meningococcal conjugate (MenACWY) vaccine  You may need this if you have certain conditions. Hepatitis A vaccine  You may need this if you have certain conditions or if you travel or work in places where you may be exposed to hepatitis A. Hepatitis B vaccine  You may need this if you have certain conditions or if you travel or work in places where you may be exposed to hepatitis B. Haemophilus influenzae type b (Hib) vaccine  You may need this if you have certain conditions. You may receive vaccines as individual doses or as more than one vaccine together in one shot (combination vaccines). Talk with your health care provider about the risks and benefits of combination vaccines. What tests do I need? Blood tests  Lipid and cholesterol levels. These may be checked every 5 years, or more frequently depending on your overall health.  Hepatitis C test.  Hepatitis B test. Screening  Lung cancer screening. You may have this screening every year starting at age 4 if you have a 30-pack-year history of smoking and currently smoke or have quit within the past 15 years.  Colorectal cancer screening. All adults should have this screening starting at age 9 and continuing until age 32. Your health care provider may recommend screening at age 81 if you are at increased risk. You will have tests every 1-10 years, depending  on your results and the type of screening test.  Diabetes screening. This is done by checking your blood sugar (glucose) after you have not eaten for a while (fasting). You may have this done every 1-3 years.  Mammogram. This may be done every 1-2 years. Talk with  your health care provider about how often you should have regular mammograms.  BRCA-related cancer screening. This may be done if you have a family history of breast, ovarian, tubal, or peritoneal cancers. Other tests  Sexually transmitted disease (STD) testing.  Bone density scan. This is done to screen for osteoporosis. You may have this done starting at age 58. Follow these instructions at home: Eating and drinking  Eat a diet that includes fresh fruits and vegetables, whole grains, lean protein, and low-fat dairy products. Limit your intake of foods with high amounts of sugar, saturated fats, and salt.  Take vitamin and mineral supplements as recommended by your health care provider.  Do not drink alcohol if your health care provider tells you not to drink.  If you drink alcohol: ? Limit how much you have to 0-1 drink a day. ? Be aware of how much alcohol is in your drink. In the [place] one drink equals one 12 oz bottle of beer (355 mL), one 5 oz glass of wine (148 mL), or one 1 oz glass of hard liquor (44 mL). Lifestyle  Take daily care of your teeth and gums.  Stay active. Exercise for at least 30 minutes on 5 or more days each week.  Do not use any products that contain nicotine or tobacco, such as cigarettes, e-cigarettes, and chewing tobacco. If you need help quitting, ask your health care provider.  If you are sexually active, practice safe sex. Use a condom or other form of protection in order to prevent STIs (sexually transmitted infections).  Talk with your health care provider about taking a low-dose aspirin or statin. What's next?  Go to your health care provider once a year for a well check visit.  Ask your health care provider how often you should have your eyes and teeth checked.  Stay up to date on all vaccines. This information is not intended to replace advice given to you by your health care provider. Make sure you discuss any questions you have with your  health care provider. Document Revised: 01/21/2018 Document Reviewed: 01/21/2018 Elsevier Patient Education  2020 [employer].  [email or web address]

## 2019-03-24 NOTE — Assessment & Plan Note (Addendum)
She has been stressed as she lost her 69 yo dog and 3 friends. She was noting significant fatigue but that is better, no changes

## 2019-03-27 ENCOUNTER — Other Ambulatory Visit: Payer: Self-pay | Admitting: Family Medicine

## 2019-03-27 MED ORDER — ROSUVASTATIN CALCIUM 5 MG PO TABS: 5.0000 mg | ORAL_TABLET | Freq: Every day | ORAL | 3 refills | Status: DC

## 2019-03-29 ENCOUNTER — Other Ambulatory Visit (HOSPITAL_COMMUNITY): Payer: Self-pay | Admitting: Psychiatry

## 2019-03-29 DIAGNOSIS — F319 Bipolar disorder, unspecified: Secondary | ICD-10-CM

## 2019-03-31 ENCOUNTER — Telehealth (HOSPITAL_COMMUNITY): Payer: Self-pay

## 2019-03-31 ENCOUNTER — Other Ambulatory Visit (HOSPITAL_COMMUNITY): Payer: Self-pay | Admitting: Psychiatry

## 2019-03-31 DIAGNOSIS — F319 Bipolar disorder, unspecified: Secondary | ICD-10-CM

## 2019-03-31 DIAGNOSIS — F419 Anxiety disorder, unspecified: Secondary | ICD-10-CM

## 2019-03-31 MED ORDER — RISPERIDONE 2 MG PO TABS: 2.0000 mg | ORAL_TABLET | Freq: Every day | ORAL | 0 refills | Status: DC

## 2019-03-31 MED ORDER — AMITRIPTYLINE HCL 50 MG PO TABS: 50.0000 mg | ORAL_TABLET | Freq: Every day | ORAL | 0 refills | Status: DC

## 2019-03-31 MED ORDER — CARBAMAZEPINE ER 200 MG PO CP12: 200.0000 mg | ORAL_CAPSULE | Freq: Two times a day (BID) | ORAL | 0 refills | Status: DC

## 2019-03-31 MED FILL — CARBATROL 200 MG CAPSULE SA: 200 | 90 days supply | Qty: 180 | Fill #0

## 2019-03-31 NOTE — Telephone Encounter (Signed)
Refill her meds at med center [facility].

## 2019-03-31 NOTE — Telephone Encounter (Signed)
Medication management - Telephone call with pt to inform Dr. Adele Schilder had sent in refills of all her medicaitons for 90 days to her [facility]. Patient to keep virtual appt 04/07/19 and agreed with plan.

## 2019-03-31 NOTE — Telephone Encounter (Signed)
Medication refill request - Patient left 2 messages today she will be out of Carbamazepine after tonight and does not have an appt until 04/07/19. Requests Dr. Adele Schilder send in a refill ASAP. States her pharmacy has been requesting refill but no response.  Last ordered 09/20/18 for 90 days plus one refill  Patient requests this be sent in today by 4:30pm when her pharmacy will close.  Agreed to request this from Dr. Adele Schilder.

## 2019-04-07 ENCOUNTER — Ambulatory Visit (INDEPENDENT_AMBULATORY_CARE_PROVIDER_SITE_OTHER): Payer: No Typology Code available for payment source | Admitting: Psychiatry

## 2019-04-07 ENCOUNTER — Other Ambulatory Visit: Payer: Self-pay

## 2019-04-07 ENCOUNTER — Encounter (HOSPITAL_COMMUNITY): Payer: Self-pay | Admitting: Psychiatry

## 2019-04-07 DIAGNOSIS — F319 Bipolar disorder, unspecified: Secondary | ICD-10-CM

## 2019-04-07 DIAGNOSIS — F419 Anxiety disorder, unspecified: Secondary | ICD-10-CM | POA: Diagnosis not present

## 2019-04-07 MED ORDER — AMITRIPTYLINE HCL 50 MG PO TABS: 50.0000 mg | ORAL_TABLET | Freq: Every day | ORAL | 0 refills | Status: DC

## 2019-04-07 MED ORDER — RISPERIDONE 2 MG PO TABS: 2.0000 mg | ORAL_TABLET | Freq: Every day | ORAL | 0 refills | Status: DC

## 2019-04-07 MED ORDER — CARBAMAZEPINE ER 200 MG PO CP12: 200.0000 mg | ORAL_CAPSULE | Freq: Two times a day (BID) | ORAL | 0 refills | Status: DC

## 2019-04-07 NOTE — Progress Notes (Signed)
Virtual Visit via Telephone Note  I connected with Margaret [name] on 04/07/19 at  4:00 PM EST by telephone and verified that I am speaking with the correct person using two identifiers.   I discussed the limitations, risks, security and privacy concerns of performing an evaluation and management service by telephone and the availability of in person appointments. I also discussed with the patient that there may be a patient responsible charge related to this service. The patient expressed understanding and agreed to proceed.   History of Present Illness: Patient was evaluated through phone session.  She is taking her medication and denies any mania, psychosis or any anxiety.  Recently she had blood work on her hemoglobin A1c is 6.  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  She is sleeping good.  She denies any irritability, crying spells or any feeling of hopelessness or worthlessness.  She has no tremors shakes or any EPS.  She like to keep her current medication which is helping her mood and anxiety.  Past Psychiatric History:Reviewed. History of inpatient in 2009. No history of suicidal attempt. Tried lithium which worked very well but discontinued due to high creatinine.  Recent Results (from the past 2160 hour(s))  Hemoglobin A1c     Status: None   Collection Time: 03/24/19  2:12 PM  Result Value Ref Range   Hgb A1c MFr Bld 6.0 4.6 - 6.5 %    Comment: Glycemic Control Guidelines for People with Diabetes:Non Diabetic:  <6%Goal of Therapy: <7%Additional Action Suggested:  >8%   CBC     Status: Abnormal   Collection Time: 03/24/19  2:12 PM  Result Value Ref Range   WBC 5.9 4.0 - 10.5 K/uL   RBC 4.53 3.87 - 5.11 Mil/uL   Platelets 243.0 150.0 - 400.0 K/uL   Hemoglobin 15.1 (H) 12.0 - 15.0 g/dL   HCT 43.9 36.0 - 46.0 %   MCV 97.0 78.0 - 100.0 fl   MCHC 34.4 30.0 - 36.0 g/dL   RDW 13.0 11.5 - 15.5 %  Comprehensive metabolic panel     Status: Abnormal   Collection  Time: 03/24/19  2:12 PM  Result Value Ref Range   Sodium 141 135 - 145 mEq/L   Potassium 4.4 3.5 - 5.1 mEq/L   Chloride 105 96 - 112 mEq/L   CO2 28 19 - 32 mEq/L   Glucose, Bld 113 (H) 70 - 99 mg/dL   BUN 18 6 - 23 mg/dL   Creatinine, Ser 0.98 0.40 - 1.20 mg/dL   Total Bilirubin 0.3 0.2 - 1.2 mg/dL   Alkaline Phosphatase 95 39 - 117 U/L   AST 14 0 - 37 U/L   ALT 13 0 - 35 U/L   Total Protein 6.4 6.0 - 8.3 g/dL   Albumin 4.3 3.5 - 5.2 g/dL   GFR 56.35 (L) >60.00 mL/min   Calcium 9.4 8.4 - 10.5 mg/dL  Lipid panel     Status: Abnormal   Collection Time: 03/24/19  2:12 PM  Result Value Ref Range   Cholesterol 226 (H) 0 - 200 mg/dL    Comment: ATP III Classification       Desirable:  < 200 mg/dL               Borderline High:  200 - 239 mg/dL          High:  > = 240 mg/dL   Triglycerides (H) 0.0 - 149.0 mg/dL    614.0  Triglyceride is over 400; calculations on Lipids are invalid.    Comment: Normal:  <150 mg/dLBorderline High:  150 - 199 mg/dL   HDL 44.30 >39.00 mg/dL   Total CHOL/HDL Ratio 5     Comment:                Men          Women1/2 Average Risk     3.4          3.3Average Risk          5.0          4.42X Average Risk          9.6          7.13X Average Risk          15.0          11.0                      TSH     Status: Abnormal   Collection Time: 03/24/19  2:12 PM  Result Value Ref Range   TSH 6.70 (H) 0.35 - 4.50 uIU/mL  LDL cholesterol, direct     Status: None   Collection Time: 03/24/19  2:12 PM  Result Value Ref Range   Direct LDL 131.0 mg/dL    Comment: Optimal:  <100 mg/dLNear or Above Optimal:  100-129 mg/dLBorderline High:  130-159 mg/dLHigh:  160-189 mg/dLVery High:  >190 mg/dL    Psychiatric Specialty Exam: Physical Exam  Review of Systems  There were no vitals taken for this visit.There is no height or weight on file to calculate BMI.  General Appearance: NA  Eye Contact:  NA  Speech:  Clear and Coherent and fast  Volume:  Normal  Mood:  Euthymic   Affect:  NA  Thought Process:  Descriptions of Associations: Intact  Orientation:  Full (Time, Place, and Person)  Thought Content:  Logical  Suicidal Thoughts:  No  Homicidal Thoughts:  No  Memory:  Immediate;   Good Recent;   Good Remote;   Good  Judgement:  Good  Insight:  Good  Psychomotor Activity:  NA  Concentration:  Concentration: Good and Attention Span: Fair  Recall:  Good  Fund of Knowledge:  Good  Language:  Good  Akathisia:  No  Handed:  Right  AIMS (if indicated):     Assets:  Communication Skills Desire for Improvement Housing Resilience Social Support Talents/Skills Transportation  ADL's:  Intact  Cognition:  WNL  Sleep:   ok      Assessment and Plan: Bipolar disorder type I.  Anxiety.  I reviewed blood work results.  Her hemoglobin A1c is 6.  Encourage healthy exercise and watch her calorie intake.  She does not want to change medication since they are working very well.  Continue carbamazepine 200 mg twice a day, Resporal 2 mg at bedtime and 50 mg amitriptyline at bedtime.  Recommended to call us back if she has any question or any concern.  Follow-up in 4 months.  We will do Tegretol level on her next appointment.  Follow Up Instructions:    I discussed the assessment and treatment plan with the patient. The patient was provided an opportunity to ask questions and all were answered. The patient agreed with the plan and demonstrated an understanding of the instructions.   The patient was advised to call back or seek an in-person evaluation if the symptoms worsen or if the condition fails  to improve as anticipated.  I provided 20 minutes of non-face-to-face time during this encounter.   [name], MD

## 2019-04-15 ENCOUNTER — Telehealth: Payer: Self-pay | Admitting: Family Medicine

## 2019-04-15 MED ORDER — LEVOTHYROXINE SODIUM 112 MCG PO TABS: 112.0000 ug | ORAL_TABLET | ORAL | 0 refills | Status: DC

## 2019-04-15 MED FILL — LEVOTHYROXINE SODIUM 112 MC: 112 | 60 days supply | Qty: 30 | Fill #0

## 2019-04-15 NOTE — Telephone Encounter (Signed)
Patient notified of note and is agreeable to plan.  She has an ov in May so can recheck tsh at that time.  She did not have of the 128mcg left.  So rx sent in for 2 month supply.

## 2019-04-15 NOTE — Telephone Encounter (Signed)
Those symptoms are common with increased treatment. If she would like she could alternate dosing. 112 every other day and 125 every other day for the next 1-2 months til we recheck levels in a couple of months.

## 2019-04-15 NOTE — Telephone Encounter (Signed)
Spoke with patient she stated that since you increased her thyroid medication (from 118mcg to 189mcg), she has been having some confusion, disorientation, anxiety, hair falling out.  She usually takes her dose in the mornings.    She sound good on the phone, from talking with her did not recognize any confusion.  Please advise.

## 2019-04-15 NOTE — Telephone Encounter (Signed)
Called patient back after getting a message that stated the patient  "having an increase in confusion and disorientation due to her Thyroid meds" Called patient to triage her left voicemail for patient to call the office back

## 2019-04-27 ENCOUNTER — Telehealth: Payer: Self-pay

## 2019-04-27 NOTE — Telephone Encounter (Signed)
Patient called in to see if Dr. Charlett Blake can refer her to a specialist for her theroid issues. Please follow up with the patient as soon as possible and advise her on which specialist she can go to. Patient phone number is [phone]

## 2019-04-28 ENCOUNTER — Other Ambulatory Visit: Payer: Self-pay | Admitting: Family Medicine

## 2019-04-28 DIAGNOSIS — E039 Hypothyroidism, unspecified: Secondary | ICD-10-CM

## 2019-04-28 NOTE — Telephone Encounter (Signed)
I have referred her to endocrinology and she may need a virtual visit next week to discuss her concerns about work. Can excuse her from work for a short time if she needs it but her symptoms are not typical of a response to thyroid treatments.

## 2019-04-28 NOTE — Telephone Encounter (Signed)
Patient called again today stating that it's urgent that she be referred to an Endo. She stated her  Anxiety confusion and disorientated since her medication and labs. She also wanted to mentioned that she can't work with this going on. Please help.  ( Previous msg was sent to [facility] on yesterday  Thanks

## 2019-04-28 NOTE — Telephone Encounter (Signed)
Please advise 

## 2019-04-29 NOTE — Telephone Encounter (Signed)
Pt was called and detailed message was left letting pt know referral was placed and to return call to schedule appt with health issues and concerns/work notes.

## 2019-05-02 ENCOUNTER — Other Ambulatory Visit: Payer: Self-pay

## 2019-05-02 ENCOUNTER — Ambulatory Visit (INDEPENDENT_AMBULATORY_CARE_PROVIDER_SITE_OTHER): Payer: No Typology Code available for payment source | Admitting: Family Medicine

## 2019-05-02 DIAGNOSIS — I10 Essential (primary) hypertension: Secondary | ICD-10-CM

## 2019-05-02 DIAGNOSIS — N289 Disorder of kidney and ureter, unspecified: Secondary | ICD-10-CM

## 2019-05-02 DIAGNOSIS — E039 Hypothyroidism, unspecified: Secondary | ICD-10-CM

## 2019-05-02 MED ORDER — LEVOTHYROXINE SODIUM 125 MCG PO TABS: 125.0000 ug | ORAL_TABLET | Freq: Every day | ORAL | 1 refills | Status: DC

## 2019-05-02 MED FILL — SYNTHROID 125 MCG TABLET: 125 | 30 days supply | Qty: 30 | Fill #0

## 2019-05-02 NOTE — Assessment & Plan Note (Signed)
Hydrate and monitor 

## 2019-05-02 NOTE — Assessment & Plan Note (Signed)
Did not tolerate the generic of synthroid she had some increased anxiety and mental status changes as well as palpitations and fatigue. Will send in a prescription for Synthroid 125 and recheck labs in 2-3 onths

## 2019-05-02 NOTE — Assessment & Plan Note (Signed)
Monitor and report any concerns. no changes to meds. Encouraged heart healthy diet such as the DASH diet and exercise as tolerated.  

## 2019-05-02 NOTE — Progress Notes (Signed)
Virtual Visit via phone Note  I connected with ERIELLE SPROWL on 05/02/19 at  1:20 PM EDT by a phone enabled telemedicine application and verified that I am speaking with the correct person using two identifiers.  Location: Patient: home Provider: home   I discussed the limitations of evaluation and management by telemedicine and the availability of in person appointments. The patient expressed understanding and agreed to proceed. Kem Boroughs, CMA was able to get the patient set up on visit, phone after being unable to set up video visit    Subjective:    Patient ID: NEVIN VEREB, female    DOB: Aug 13, 1950, 68 y.o.   MRN: [id number]  No chief complaint on file.   HPI Patient is in today for follow up on hypothyroidism. She is noting she is struggling with some side effects on generic Levothyroxine and this has happened to her in the past and when she went back on brand name Synthroid her symptoms resolved. She is noting palpitations, anxiety which is improving, mental fuzziness and fatigue. Denies CP/SOB/HA/congestion/fevers/GI or GU c/o. Taking meds as prescribed  Past Medical History:  Diagnosis Date  . Anxiety   . Arthritis 02/14/2013  . Bipolar disorder ([place])   . Esophageal reflux 09/19/2012   only slight with gallbladder problems  . Essential hypertension 09/28/2018  . Hypoglycemia   . Hypothyroidism   . Other malaise and fatigue 09/19/2012  . PONV (postoperative nausea and vomiting)   . Preventative health care 06/09/2016  . PTSD (post-traumatic stress disorder)   . Thyroid disease     Past Surgical History:  Procedure Laterality Date  . CHOLECYSTECTOMY N/A 09/01/2016   Procedure: LAPAROSCOPIC CHOLECYSTECTOMY WITH INTRAOPERATIVE CHOLANGIOGRAM;  Surgeon: Jovita Kussmaul, MD;  Location: [facility];  Service: General;  Laterality: N/A;  . 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 fooet surgery Left 2014   reconstruction of foot and ankle to correct deformity  . NECK SURGERY    . OTHER  SURGICAL HISTORY    . OTHER SURGICAL HISTORY    . OTHER SURGICAL HISTORY    . TUBAL LIGATION     reversal    Family History  Problem Relation Age of Onset  . Diabetes Mother        Brother 1 of 2  . Heart failure Mother   . Prostate cancer Father   . Parkinsonism Father        deceased  . Hypertension Brother   . Esophageal cancer Maternal Grandmother   . Cancer Paternal Grandmother        cancer of jawbone 1 of 2  . Melanoma Brother   . Breast cancer Neg Hx   . Colon cancer Neg Hx     Social History   Socioeconomic History  . Marital status: Divorced    Spouse name: Not on file  . Number of children: Not on file  . Years of education: Not on file  . Highest education level: Not on file  Occupational History  . Not on file  Tobacco Use  . Smoking status: Former Smoker    Packs/day: 3.00    Years: 24.00    Pack years: 72.00    Types: Cigarettes    Quit date: 1995    Years since quitting: 26.2  . Smokeless tobacco: Never Used  Substance and Sexual Activity  . Alcohol use: No    Alcohol/week: 0.0 standard drinks  . Drug use: No  . Sexual activity: Not on  file  Other Topics Concern  . Not on file  Social History Narrative  . Not on file   Social Determinants of Health   Financial Resource Strain:   . Difficulty of Paying Living Expenses:   Food Insecurity:   . Worried About Charity fundraiser in the Last Year:   . Arboriculturist in the Last Year:   Transportation Needs:   . Film/video editor (Medical):   Marland Kitchen Lack of Transportation (Non-Medical):   Physical Activity:   . Days of Exercise per Week:   . Minutes of Exercise per Session:   Stress:   . Feeling of Stress :   Social Connections:   . Frequency of Communication with Friends and Family:   . Frequency of Social Gatherings with Friends and Family:   . Attends Religious Services:   . Active Member of Clubs or Organizations:   . Attends Archivist Meetings:   Marland Kitchen Marital Status:     Intimate Partner Violence:   . Fear of Current or Ex-Partner:   . Emotionally Abused:   Marland Kitchen Physically Abused:   . Sexually Abused:     Outpatient Medications Prior to Visit  Medication Sig Dispense Refill  . amitriptyline (ELAVIL) 50 MG tablet Take 1 tablet (50 mg total) by mouth at bedtime. 90 tablet 0  . atorvastatin (LIPITOR) 10 MG tablet Take 1 tablet (10 mg total) by mouth daily. (Patient not taking: Reported on 03/24/2019) 90 tablet 3  . carbamazepine (CARBATROL) 200 MG 12 hr capsule Take 1 capsule (200 mg total) by mouth 2 (two) times daily. 180 capsule 0  . Clobetasol Propionate 0.05 % lotion Apply 1 application topically 2 (two) times daily. 118 mL 1  . lisinopril (ZESTRIL) 10 MG tablet Take 1 tablet (10 mg total) by mouth daily. (Patient not taking: Reported on 03/24/2019) 90 tablet 3  . risperiDONE (RISPERDAL) 2 MG tablet Take 1 tablet (2 mg total) by mouth at bedtime. 90 tablet 0  . rosuvastatin (CRESTOR) 5 MG tablet Take 1 tablet (5 mg total) by mouth daily. 15 tablet 3  . levothyroxine (SYNTHROID) 112 MCG tablet Take 1 tablet (112 mcg total) by mouth every other day. 30 tablet 0  . levothyroxine (SYNTHROID) 125 MCG tablet Take 1 tablet (125 mcg total) by mouth daily. 90 tablet 1   No facility-administered medications prior to visit.    Allergies  Allergen Reactions  . Novocain [Procaine] Other (See Comments)    Heart race  . Codeine Nausea Only  . Darvon Other (See Comments)    Hallucinations   . Sulfa Drugs Cross Reactors Nausea And Vomiting    Review of Systems  Constitutional: Positive for malaise/fatigue. Negative for fever.  HENT: Negative for congestion.   Eyes: Negative for blurred vision.  Respiratory: Negative for shortness of breath.   Cardiovascular: Positive for palpitations. Negative for chest pain and leg swelling.  Gastrointestinal: Negative for abdominal pain, blood in stool and nausea.  Genitourinary: Negative for dysuria and frequency.   Musculoskeletal: Negative for falls.  Skin: Negative for rash.  Neurological: Negative for dizziness, loss of consciousness and headaches.  Endo/Heme/Allergies: Negative for environmental allergies.  Psychiatric/Behavioral: Negative for depression. The patient is nervous/anxious.        Objective:    Physical Exam unable to obtain via phone visit  There were no vitals taken for this visit. Wt Readings from Last 3 Encounters:  03/24/19 208 lb (94.3 kg)  10/08/18 207 lb 12.8 oz (  94.3 kg)  09/28/18 207 lb 6.4 oz (94.1 kg)    Diabetic Foot Exam - Simple   No data filed     Lab Results  Component Value Date   WBC 5.9 03/24/2019   HGB 15.1 (H) 03/24/2019   HCT 43.9 03/24/2019   PLT 243.0 03/24/2019   GLUCOSE 113 (H) 03/24/2019   CHOL 226 (H) 03/24/2019   TRIG (H) 03/24/2019    614.0 Triglyceride is over 400; calculations on Lipids are invalid.   HDL 44.30 03/24/2019   LDLDIRECT 131.0 03/24/2019   LDLCALC 118 (H) 03/16/2017   ALT 13 03/24/2019   AST 14 03/24/2019   NA 141 03/24/2019   K 4.4 03/24/2019   CL 105 03/24/2019   CREATININE 0.98 03/24/2019   BUN 18 03/24/2019   CO2 28 03/24/2019   TSH 6.70 (H) 03/24/2019   HGBA1C 6.0 03/24/2019    Lab Results  Component Value Date   TSH 6.70 (H) 03/24/2019   Lab Results  Component Value Date   WBC 5.9 03/24/2019   HGB 15.1 (H) 03/24/2019   HCT 43.9 03/24/2019   MCV 97.0 03/24/2019   PLT 243.0 03/24/2019   Lab Results  Component Value Date   NA 141 03/24/2019   K 4.4 03/24/2019   CO2 28 03/24/2019   GLUCOSE 113 (H) 03/24/2019   BUN 18 03/24/2019   CREATININE 0.98 03/24/2019   BILITOT 0.3 03/24/2019   ALKPHOS 95 03/24/2019   AST 14 03/24/2019   ALT 13 03/24/2019   PROT 6.4 03/24/2019   ALBUMIN 4.3 03/24/2019   CALCIUM 9.4 03/24/2019   ANIONGAP 4 (L) 08/26/2016   GFR 56.35 (L) 03/24/2019   Lab Results  Component Value Date   CHOL 226 (H) 03/24/2019   Lab Results  Component Value Date   HDL 44.30  03/24/2019   Lab Results  Component Value Date   LDLCALC 118 (H) 03/16/2017   Lab Results  Component Value Date   TRIG (H) 03/24/2019    614.0 Triglyceride is over 400; calculations on Lipids are invalid.   Lab Results  Component Value Date   CHOLHDL 5 03/24/2019   Lab Results  Component Value Date   HGBA1C 6.0 03/24/2019       Assessment & Plan:   Problem List Items Addressed This Visit    Hypothyroidism    Did not tolerate the generic of synthroid she had some increased anxiety and mental status changes as well as palpitations and fatigue. Will send in a prescription for Synthroid 125 and recheck labs in 2-3 onths      Relevant Medications   levothyroxine (SYNTHROID) 125 MCG tablet   Renal insufficiency    Hydrate and monitor      Essential hypertension    Monitor and report any concerns no changes to meds. Encouraged heart healthy diet such as the DASH diet and exercise as tolerated.          I have discontinued Shelton Furniss. [place]'s levothyroxine and levothyroxine. I am also having her start on levothyroxine. Additionally, I am having her maintain her Clobetasol Propionate, atorvastatin, lisinopril, rosuvastatin, risperiDONE, carbamazepine, and amitriptyline.  Meds ordered this encounter  Medications  . levothyroxine (SYNTHROID) 125 MCG tablet    Sig: Take 1 tablet (125 mcg total) by mouth daily. Did not tolerate generic, caused palpitations, Mental status changes and fatigue    Dispense:  90 tablet    Refill:  1    I discussed the assessment and treatment plan with  the patient. The patient was provided an opportunity to ask questions and all were answered. The patient agreed with the plan and demonstrated an understanding of the instructions.   The patient was advised to call back or seek an in-person evaluation if the symptoms worsen or if the condition fails to improve as anticipated.  I provided 15 minutes of non-face-to-face time during this  encounter.   Penni Homans, MD

## 2019-05-04 ENCOUNTER — Telehealth (HOSPITAL_COMMUNITY): Payer: Self-pay | Admitting: *Deleted

## 2019-05-04 NOTE — Telephone Encounter (Signed)
Received t/c from pt c/o "random thoughts and words" just pop up she says. Pt says she's been compliant with medication regime and is worried why this is happening. .pt next appointment on 07/05/19. Please review.

## 2019-05-04 NOTE — Telephone Encounter (Signed)
She is on her current psychotropic medication for a while.  I saw there has been telephone messages to her primary care physician concerning about thyroid medicine changes especially confusion.  I believe she should speak to her physician who is managing her thyroid medicine if she has any issues tolerating the dosage.

## 2019-05-11 ENCOUNTER — Telehealth (HOSPITAL_COMMUNITY): Payer: Self-pay | Admitting: *Deleted

## 2019-05-11 NOTE — Telephone Encounter (Signed)
Writer left second VM for pt regarding f/u on s & s mentioned in last encounter, and Dr. Adele Schilder encouraging pt to talk to her Thyroid doctor. Writer encouraged pt to return call to this nurse to check in.

## 2019-05-25 MED FILL — risperiDONE 2 MG TABS: 2 | 90 days supply | Qty: 90 | Fill #0

## 2019-05-25 MED FILL — AMITRIPTYLINE HCL 50 MG TAB: 50 | 90 days supply | Qty: 90 | Fill #0

## 2019-05-26 MED FILL — SYNTHROID 125 MCG TABLET: 125 | 90 days supply | Qty: 90 | Fill #1

## 2019-06-23 ENCOUNTER — Other Ambulatory Visit: Payer: Self-pay

## 2019-06-23 ENCOUNTER — Encounter: Payer: Self-pay | Admitting: Family Medicine

## 2019-06-23 ENCOUNTER — Ambulatory Visit (INDEPENDENT_AMBULATORY_CARE_PROVIDER_SITE_OTHER): Payer: No Typology Code available for payment source | Admitting: Family Medicine

## 2019-06-23 VITALS — BP 118/82 | HR 82 | Temp 97.9°F | Resp 12 | Ht 67.0 in | Wt 202.0 lb

## 2019-06-23 DIAGNOSIS — E559 Vitamin D deficiency, unspecified: Secondary | ICD-10-CM | POA: Diagnosis not present

## 2019-06-23 DIAGNOSIS — E785 Hyperlipidemia, unspecified: Secondary | ICD-10-CM | POA: Diagnosis not present

## 2019-06-23 DIAGNOSIS — N289 Disorder of kidney and ureter, unspecified: Secondary | ICD-10-CM | POA: Diagnosis not present

## 2019-06-23 DIAGNOSIS — R739 Hyperglycemia, unspecified: Secondary | ICD-10-CM | POA: Diagnosis not present

## 2019-06-23 DIAGNOSIS — E039 Hypothyroidism, unspecified: Secondary | ICD-10-CM

## 2019-06-23 DIAGNOSIS — I10 Essential (primary) hypertension: Secondary | ICD-10-CM | POA: Diagnosis not present

## 2019-06-23 LAB — CBC
HCT: 41.9 % (ref 36.0–46.0)
Hemoglobin: 14.4 g/dL (ref 12.0–15.0)
MCHC: 34.5 g/dL (ref 30.0–36.0)
MCV: 96.5 fl (ref 78.0–100.0)
Platelets: 223 10*3/uL (ref 150.0–400.0)
RBC: 4.34 Mil/uL (ref 3.87–5.11)
RDW: 13.1 % (ref 11.5–15.5)
WBC: 5.9 10*3/uL (ref 4.0–10.5)

## 2019-06-23 LAB — COMPREHENSIVE METABOLIC PANEL
ALT: 11 U/L (ref 0–35)
AST: 12 U/L (ref 0–37)
Albumin: 4.3 g/dL (ref 3.5–5.2)
Alkaline Phosphatase: 89 U/L (ref 39–117)
BUN: 17 mg/dL (ref 6–23)
CO2: 28 mEq/L (ref 19–32)
Calcium: 9.6 mg/dL (ref 8.4–10.5)
Chloride: 106 mEq/L (ref 96–112)
Creatinine, Ser: 1.03 mg/dL (ref 0.40–1.20)
GFR: 53.17 mL/min — ABNORMAL LOW (ref >60.00)
Glucose, Bld: 96 mg/dL (ref 70–99)
Potassium: 4.8 mEq/L (ref 3.5–5.1)
Sodium: 141 mEq/L (ref 135–145)
Total Bilirubin: 0.4 mg/dL (ref 0.2–1.2)
Total Protein: 6.4 g/dL (ref 6.0–8.3)

## 2019-06-23 LAB — LIPID PANEL
Cholesterol: 212 mg/dL — ABNORMAL HIGH (ref 0–200)
HDL: 44.3 mg/dL (ref >39.00)
LDL Cholesterol: 128 mg/dL — ABNORMAL HIGH (ref 0–99)
NonHDL: 167.46
Total CHOL/HDL Ratio: 5
Triglycerides: 197 mg/dL — ABNORMAL HIGH (ref 0.0–149.0)
VLDL: 39.4 mg/dL (ref 0.0–40.0)

## 2019-06-23 LAB — TSH: TSH: 0.99 u[IU]/mL (ref 0.35–4.50)

## 2019-06-23 LAB — HEMOGLOBIN A1C: Hgb A1c MFr Bld: 5.9 % (ref 4.6–6.5)

## 2019-06-23 LAB — VITAMIN D 25 HYDROXY (VIT D DEFICIENCY, FRACTURES): VITD: 17.61 ng/mL — ABNORMAL LOW (ref 30.00–100.00)

## 2019-06-23 NOTE — Patient Instructions (Signed)
[employer] Blood Pressure cuff, upper arm, want BP 100-140/60-90 Pulse oximeter, want oxygen in 90s  Weekly vitals  Take Multivitamin with minerals, selenium Vitamin D 1000-2000 IU daily Probiotic with lactobacillus and bifidophilus Asprin EC 81 mg daily Fish r Krill oil cap daily Melatonin 2-5 mg at bedtime  [email or web address]

## 2019-06-23 NOTE — Assessment & Plan Note (Signed)
Well controlled, no changes to meds. Encouraged heart healthy diet such as the DASH diet and exercise as tolerated.  °

## 2019-06-23 NOTE — Assessment & Plan Note (Signed)
Supplement and monitor 

## 2019-06-26 NOTE — Progress Notes (Signed)
Subjective:    Patient ID: SUMEDHA MATUSIK, female    DOB: 1950/08/06, 69 y.o.   MRN: [id number]  Chief Complaint  Patient presents with  . 3 month follow up    HPI Patient is in today for follow up on chronic medical concerns. No recent febrile illness or hospitalizations. She feels much better with the return to brand name Synthroid. She was having some confusion and fatigue but now feels better. Denies CP/palp/SOB/HA/congestion/fevers/GI or GU c/o. Taking meds as prescribed  Past Medical History:  Diagnosis Date  . Anxiety   . Arthritis 02/14/2013  . Bipolar disorder ([place])   . Esophageal reflux 09/19/2012   only slight with gallbladder problems  . Essential hypertension 09/28/2018  . Hypoglycemia   . Hypothyroidism   . Other malaise and fatigue 09/19/2012  . PONV (postoperative nausea and vomiting)   . Preventative health care 06/09/2016  . PTSD (post-traumatic stress disorder)   . Thyroid disease     Past Surgical History:  Procedure Laterality Date  . CHOLECYSTECTOMY N/A 09/01/2016   Procedure: LAPAROSCOPIC CHOLECYSTECTOMY WITH INTRAOPERATIVE CHOLANGIOGRAM;  Surgeon: Jovita Kussmaul, MD;  Location: [facility];  Service: General;  Laterality: N/A;  . 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 fooet surgery Left 2014   reconstruction of foot and ankle to correct deformity  . NECK SURGERY    . OTHER SURGICAL HISTORY    . OTHER SURGICAL HISTORY    . OTHER SURGICAL HISTORY    . TUBAL LIGATION     reversal    Family History  Problem Relation Age of Onset  . Diabetes Mother        Brother 1 of 2  . Heart failure Mother   . Prostate cancer Father   . Parkinsonism Father        deceased  . Hypertension Brother   . Esophageal cancer Maternal Grandmother   . Cancer Paternal Grandmother        cancer of jawbone 1 of 2  . Melanoma Brother   . Breast cancer Neg Hx   . Colon cancer Neg Hx     Social History   Socioeconomic History  . Marital status: Divorced    Spouse name: Not on file  .  Number of children: Not on file  . Years of education: Not on file  . Highest education level: Not on file  Occupational History  . Not on file  Tobacco Use  . Smoking status: Former Smoker    Packs/day: 3.00    Years: 24.00    Pack years: 72.00    Types: Cigarettes    Quit date: 1995    Years since quitting: 26.3  . Smokeless tobacco: Never Used  Substance and Sexual Activity  . Alcohol use: No    Alcohol/week: 0.0 standard drinks  . Drug use: No  . Sexual activity: Not on file  Other Topics Concern  . Not on file  Social History Narrative  . Not on file   Social Determinants of Health   Financial Resource Strain:   . Difficulty of Paying Living Expenses:   Food Insecurity:   . Worried About Charity fundraiser in the Last Year:   . Arboriculturist in the Last Year:   Transportation Needs:   . Film/video editor (Medical):   Marland Kitchen Lack of Transportation (Non-Medical):   Physical Activity:   . Days of Exercise per Week:   . Minutes of Exercise per  Session:   Stress:   . Feeling of Stress :   Social Connections:   . Frequency of Communication with Friends and Family:   . Frequency of Social Gatherings with Friends and Family:   . Attends Religious Services:   . Active Member of Clubs or Organizations:   . Attends Archivist Meetings:   Marland Kitchen Marital Status:   Intimate Partner Violence:   . Fear of Current or Ex-Partner:   . Emotionally Abused:   Marland Kitchen Physically Abused:   . Sexually Abused:     Outpatient Medications Prior to Visit  Medication Sig Dispense Refill  . amitriptyline (ELAVIL) 50 MG tablet Take 1 tablet (50 mg total) by mouth at bedtime. 90 tablet 0  . carbamazepine (CARBATROL) 200 MG 12 hr capsule Take 1 capsule (200 mg total) by mouth 2 (two) times daily. 180 capsule 0  . levothyroxine (SYNTHROID) 125 MCG tablet Take 1 tablet (125 mcg total) by mouth daily. Did not tolerate generic, caused palpitations, Mental status changes and fatigue 90  tablet 1  . risperiDONE (RISPERDAL) 2 MG tablet Take 1 tablet (2 mg total) by mouth at bedtime. 90 tablet 0  . atorvastatin (LIPITOR) 10 MG tablet Take 1 tablet (10 mg total) by mouth daily. (Patient not taking: Reported on 03/24/2019) 90 tablet 3  . Clobetasol Propionate 0.05 % lotion Apply 1 application topically 2 (two) times daily. 118 mL 1  . lisinopril (ZESTRIL) 10 MG tablet Take 1 tablet (10 mg total) by mouth daily. (Patient not taking: Reported on 03/24/2019) 90 tablet 3  . rosuvastatin (CRESTOR) 5 MG tablet Take 1 tablet (5 mg total) by mouth daily. 15 tablet 3   No facility-administered medications prior to visit.    Allergies  Allergen Reactions  . Novocain [Procaine] Other (See Comments)    Heart race  . Codeine Nausea Only  . Darvon Other (See Comments)    Hallucinations   . Sulfa Drugs Cross Reactors Nausea And Vomiting    Review of Systems  Constitutional: Negative for fever and malaise/fatigue.  HENT: Negative for congestion.   Eyes: Negative for blurred vision.  Respiratory: Negative for shortness of breath.   Cardiovascular: Negative for chest pain, palpitations and leg swelling.  Gastrointestinal: Negative for abdominal pain, blood in stool and nausea.  Genitourinary: Negative for dysuria and frequency.  Musculoskeletal: Negative for falls.  Skin: Negative for rash.  Neurological: Negative for dizziness, loss of consciousness and headaches.  Endo/Heme/Allergies: Negative for environmental allergies.  Psychiatric/Behavioral: Negative for depression. The patient is not nervous/anxious.        Objective:    Physical Exam Vitals and nursing note reviewed.  Constitutional:      General: She is not in acute distress.    Appearance: She is well-developed.  HENT:     Head: Normocephalic and atraumatic.     Nose: Nose normal.  Eyes:     General:        Right eye: No discharge.        Left eye: No discharge.  Cardiovascular:     Rate and Rhythm: Normal rate  and regular rhythm.     Heart sounds: No murmur.  Pulmonary:     Effort: Pulmonary effort is normal.     Breath sounds: Normal breath sounds.  Abdominal:     General: Bowel sounds are normal.     Palpations: Abdomen is soft.     Tenderness: There is no abdominal tenderness.  Musculoskeletal:  Cervical back: Normal range of motion and neck supple.  Skin:    General: Skin is warm and dry.  Neurological:     Mental Status: She is alert and oriented to person, place, and time.     BP 118/82 (BP Location: Right Arm, Cuff Size: Large)   Pulse 82   Temp 97.9 F (36.6 C) (Temporal)   Resp 12   Ht 5\' 7"  (1.702 m)   Wt 202 lb (91.6 kg)   SpO2 98%   BMI 31.64 kg/m  Wt Readings from Last 3 Encounters:  06/23/19 202 lb (91.6 kg)  03/24/19 208 lb (94.3 kg)  10/08/18 207 lb 12.8 oz (94.3 kg)    Diabetic Foot Exam - Simple   No data filed     Lab Results  Component Value Date   WBC 5.9 06/23/2019   HGB 14.4 06/23/2019   HCT 41.9 06/23/2019   PLT 223.0 06/23/2019   GLUCOSE 96 06/23/2019   CHOL 212 (H) 06/23/2019   TRIG 197.0 (H) 06/23/2019   HDL 44.30 06/23/2019   LDLDIRECT 131.0 03/24/2019   LDLCALC 128 (H) 06/23/2019   ALT 11 06/23/2019   AST 12 06/23/2019   NA 141 06/23/2019   K 4.8 06/23/2019   CL 106 06/23/2019   CREATININE 1.03 06/23/2019   BUN 17 06/23/2019   CO2 28 06/23/2019   TSH 0.99 06/23/2019   HGBA1C 5.9 06/23/2019    Lab Results  Component Value Date   TSH 0.99 06/23/2019   Lab Results  Component Value Date   WBC 5.9 06/23/2019   HGB 14.4 06/23/2019   HCT 41.9 06/23/2019   MCV 96.5 06/23/2019   PLT 223.0 06/23/2019   Lab Results  Component Value Date   NA 141 06/23/2019   K 4.8 06/23/2019   CO2 28 06/23/2019   GLUCOSE 96 06/23/2019   BUN 17 06/23/2019   CREATININE 1.03 06/23/2019   BILITOT 0.4 06/23/2019   ALKPHOS 89 06/23/2019   AST 12 06/23/2019   ALT 11 06/23/2019   PROT 6.4 06/23/2019   ALBUMIN 4.3 06/23/2019   CALCIUM 9.6  06/23/2019   ANIONGAP 4 (L) 08/26/2016   GFR 53.17 (L) 06/23/2019   Lab Results  Component Value Date   CHOL 212 (H) 06/23/2019   Lab Results  Component Value Date   HDL 44.30 06/23/2019   Lab Results  Component Value Date   LDLCALC 128 (H) 06/23/2019   Lab Results  Component Value Date   TRIG 197.0 (H) 06/23/2019   Lab Results  Component Value Date   CHOLHDL 5 06/23/2019   Lab Results  Component Value Date   HGBA1C 5.9 06/23/2019       Assessment & Plan:   Problem List Items Addressed This Visit    Hypothyroidism - Primary    Feels much better on brand name Synthroid. Her labs are ordered and reviewed today and they are improved as well.      Relevant Orders   TSH (Completed)   Hyperlipidemia   Relevant Orders   Lipid panel (Completed)   Hyperglycemia    hgba1c acceptable, minimize simple carbs. Increase exercise as tolerated.       Relevant Orders   Hemoglobin A1c (Completed)   Renal insufficiency    Hydrate andmonitor      Essential hypertension    Well controlled, no changes to meds. Encouraged heart healthy diet such as the DASH diet and exercise as tolerated.       Relevant Orders  Comprehensive metabolic panel (Completed)   CBC (Completed)   Vitamin D deficiency    Supplement and monitor      Relevant Orders   VITAMIN D 25 Hydroxy (Vit-D Deficiency, Fractures) (Completed)      I have discontinued Danelle Berry. [place]'s Clobetasol Propionate, atorvastatin, lisinopril, and rosuvastatin. I am also having her maintain her risperiDONE, carbamazepine, amitriptyline, and levothyroxine.  No orders of the defined types were placed in this encounter.    Penni Homans, MD

## 2019-06-26 NOTE — Assessment & Plan Note (Signed)
Feels much better on brand name Synthroid. Her labs are ordered and reviewed today and they are improved as well.

## 2019-06-26 NOTE — Assessment & Plan Note (Signed)
Hydrate and monitor 

## 2019-06-26 NOTE — Assessment & Plan Note (Signed)
hgba1c acceptable, minimize simple carbs. Increase exercise as tolerated.  

## 2019-06-28 ENCOUNTER — Other Ambulatory Visit: Payer: Self-pay | Admitting: *Deleted

## 2019-06-28 MED ORDER — VITAMIN D (ERGOCALCIFEROL) 1.25 MG (50000 UNIT) PO CAPS
50000.0000 [IU] | ORAL_CAPSULE | ORAL | 4 refills | Status: DC
Start: 2019-06-28 — End: 2021-04-18

## 2019-06-28 MED FILL — CARBATROL 200 MG CAPSULE SA: 200 | 90 days supply | Qty: 180 | Fill #0

## 2019-06-28 MED FILL — VIT D2 1.25 MG (50,000 UNIT: 1.25 MG | 28 days supply | Qty: 4 | Fill #0

## 2019-07-05 ENCOUNTER — Encounter (HOSPITAL_COMMUNITY): Payer: Self-pay | Admitting: Psychiatry

## 2019-07-05 ENCOUNTER — Telehealth (INDEPENDENT_AMBULATORY_CARE_PROVIDER_SITE_OTHER): Payer: No Typology Code available for payment source | Admitting: Psychiatry

## 2019-07-05 ENCOUNTER — Other Ambulatory Visit: Payer: Self-pay

## 2019-07-05 DIAGNOSIS — F419 Anxiety disorder, unspecified: Secondary | ICD-10-CM | POA: Diagnosis not present

## 2019-07-05 DIAGNOSIS — F319 Bipolar disorder, unspecified: Secondary | ICD-10-CM | POA: Diagnosis not present

## 2019-07-05 MED ORDER — RISPERIDONE 2 MG PO TABS: 2.0000 mg | ORAL_TABLET | Freq: Every day | ORAL | 0 refills | Status: DC

## 2019-07-05 MED ORDER — CARBAMAZEPINE ER 200 MG PO CP12: 200.0000 mg | ORAL_CAPSULE | Freq: Two times a day (BID) | ORAL | 0 refills | Status: DC

## 2019-07-05 MED ORDER — AMITRIPTYLINE HCL 50 MG PO TABS: 50.0000 mg | ORAL_TABLET | Freq: Every day | ORAL | 0 refills | Status: DC

## 2019-07-05 NOTE — Progress Notes (Signed)
Virtual Visit via Telephone Note  I connected with Margaret [name] on 07/05/19 at  8:40 AM EDT by telephone and verified that I am speaking with the correct person using two identifiers.   I discussed the limitations, risks, security and privacy concerns of performing an evaluation and management service by telephone and the availability of in person appointments. I also discussed with the patient that there may be a patient responsible charge related to this service. The patient expressed understanding and agreed to proceed.  Patient Location; Home Provider Location: Home Office  History of Present Illness: Patient is evaluated by phone session.  She is feeling better now but she had episodes of confusion and she was very nervous and anxious because her thinking was not clear.  We have recommended to see her physician who is managing her thyroid medicine.  Her medicines were adjusted and now she is taking brand name thyroid medicine which is expensive but helping her thinking, mood, metabolism.  She reported that that was not a very difficult time because she has to take time off from the work as she cannot function but now she is pleased that combination of psych medicine and thyroid medicine working well.  She is sleeping good.  She denies any irritability, mania, hallucination or any anger issues.  Recently she is concerned about her dog who is obsessed about the eating.  She is hoping to take her daughter to [place].  Overall she describes her mood is stable.  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  She likes her job.  She has no tremors shakes or any EPS.  Past Psychiatric History:Reviewed. History of inpatient in 2009. No history of suicidal attempt. Tried lithium which worked very well but discontinued due to high creatinine.  Recent Results (from the past 2160 hour(s))  Comprehensive metabolic panel     Status: Abnormal   Collection Time: 06/23/19 10:14 AM  Result Value  Ref Range   Sodium 141 135 - 145 mEq/L   Potassium 4.8 3.5 - 5.1 mEq/L   Chloride 106 96 - 112 mEq/L   CO2 28 19 - 32 mEq/L   Glucose, Bld 96 70 - 99 mg/dL   BUN 17 6 - 23 mg/dL   Creatinine, Ser 1.03 0.40 - 1.20 mg/dL   Total Bilirubin 0.4 0.2 - 1.2 mg/dL   Alkaline Phosphatase 89 39 - 117 U/L   AST 12 0 - 37 U/L   ALT 11 0 - 35 U/L   Total Protein 6.4 6.0 - 8.3 g/dL   Albumin 4.3 3.5 - 5.2 g/dL   GFR 53.17 (L) >60.00 mL/min   Calcium 9.6 8.4 - 10.5 mg/dL  CBC     Status: None   Collection Time: 06/23/19 10:14 AM  Result Value Ref Range   WBC 5.9 4.0 - 10.5 K/uL   RBC 4.34 3.87 - 5.11 Mil/uL   Platelets 223.0 150.0 - 400.0 K/uL   Hemoglobin 14.4 12.0 - 15.0 g/dL   HCT 41.9 36.0 - 46.0 %   MCV 96.5 78.0 - 100.0 fl   MCHC 34.5 30.0 - 36.0 g/dL   RDW 13.1 11.5 - 15.5 %  Lipid panel     Status: Abnormal   Collection Time: 06/23/19 10:14 AM  Result Value Ref Range   Cholesterol 212 (H) 0 - 200 mg/dL    Comment: ATP III Classification       Desirable:  < 200 mg/dL  Borderline High:  200 - 239 mg/dL          High:  > = 240 mg/dL   Triglycerides 197.0 (H) 0.0 - 149.0 mg/dL    Comment: Normal:  <150 mg/dLBorderline High:  150 - 199 mg/dL   HDL 44.30 >39.00 mg/dL   VLDL 39.4 0.0 - 40.0 mg/dL   LDL Cholesterol 128 (H) 0 - 99 mg/dL   Total CHOL/HDL Ratio 5     Comment:                Men          Women1/2 Average Risk     3.4          3.3Average Risk          5.0          4.42X Average Risk          9.6          7.13X Average Risk          15.0          11.0                       NonHDL 167.46     Comment: NOTE:  Non-HDL goal should be 30 mg/dL higher than patient's LDL goal (i.e. LDL goal of < 70 mg/dL, would have non-HDL goal of < 100 mg/dL)  TSH     Status: None   Collection Time: 06/23/19 10:14 AM  Result Value Ref Range   TSH 0.99 0.35 - 4.50 uIU/mL  Hemoglobin A1c     Status: None   Collection Time: 06/23/19 10:14 AM  Result Value Ref Range   Hgb A1c MFr Bld  5.9 4.6 - 6.5 %    Comment: Glycemic Control Guidelines for People with Diabetes:Non Diabetic:  <6%Goal of Therapy: <7%Additional Action Suggested:  >8%   VITAMIN D 25 Hydroxy (Vit-D Deficiency, Fractures)     Status: Abnormal   Collection Time: 06/23/19 10:14 AM  Result Value Ref Range   VITD 17.61 (L) 30.00 - 100.00 ng/mL      Psychiatric Specialty Exam: Physical Exam  Review of Systems  There were no vitals taken for this visit.There is no height or weight on file to calculate BMI.  General Appearance: NA  Eye Contact:  NA  Speech:  fast but clear  Volume:  Normal  Mood:  Euthymic  Affect:  NA  Thought Process:  Goal Directed  Orientation:  Full (Time, Place, and Person)  Thought Content:  Logical  Suicidal Thoughts:  No  Homicidal Thoughts:  No  Memory:  Immediate;   Good Recent;   Good Remote;   Good  Judgement:  Intact  Insight:  Present  Psychomotor Activity:  NA  Concentration:  Concentration: Good and Attention Span: Good  Recall:  Good  Fund of Knowledge:  Good  Language:  Good  Akathisia:  No  Handed:  Right  AIMS (if indicated):     Assets:  Communication Skills Desire for Improvement Housing Resilience Talents/Skills Transportation  ADL's:  Intact  Cognition:  WNL  Sleep:   Good      Assessment and Plan: Bipolar disorder type I.  Anxiety.  I reviewed her blood work results.  Since her thyroid medicine is adjusted and she is taking brand name Synthroid she feels good.  She is taking carbamazepine 200 mg twice a day, Risperdal 2 mg at bedtime, amitriptyline 50 mg at  bedtime.  She is also taking vitamin D supplement which was low.  Discussed medication side effects and benefits.  We will do Tegretol level.  I recommend to call us back if she has any questions or any concerns.  Follow-up in 3 months.  Follow Up Instructions:    I discussed the assessment and treatment plan with the patient. The patient was provided an opportunity to ask questions and  all were answered. The patient agreed with the plan and demonstrated an understanding of the instructions.   The patient was advised to call back or seek an in-person evaluation if the symptoms worsen or if the condition fails to improve as anticipated.  I provided 20 minutes of non-face-to-face time during this encounter.   [name], MD

## 2019-07-27 MED FILL — VIT D2 1.25 MG (50,000 UNIT: 1.25 MG | 28 days supply | Qty: 4 | Fill #1

## 2019-08-16 ENCOUNTER — Encounter: Payer: Self-pay | Admitting: Family Medicine

## 2019-08-16 ENCOUNTER — Ambulatory Visit (INDEPENDENT_AMBULATORY_CARE_PROVIDER_SITE_OTHER): Payer: No Typology Code available for payment source | Admitting: Family Medicine

## 2019-08-16 ENCOUNTER — Other Ambulatory Visit: Payer: Self-pay

## 2019-08-16 ENCOUNTER — Telehealth: Payer: Self-pay

## 2019-08-16 VITALS — BP 128/80 | HR 124 | Temp 98.9°F | Ht 67.0 in | Wt 204.5 lb

## 2019-08-16 DIAGNOSIS — Z779 Other contact with and (suspected) exposures hazardous to health: Secondary | ICD-10-CM | POA: Diagnosis not present

## 2019-08-16 DIAGNOSIS — R000 Tachycardia, unspecified: Secondary | ICD-10-CM

## 2019-08-16 LAB — COMPREHENSIVE METABOLIC PANEL
ALT: 15 U/L (ref 0–35)
AST: 14 U/L (ref 0–37)
Albumin: 4.7 g/dL (ref 3.5–5.2)
Alkaline Phosphatase: 94 U/L (ref 39–117)
BUN: 25 mg/dL — ABNORMAL HIGH (ref 6–23)
CO2: 25 mEq/L (ref 19–32)
Calcium: 9.9 mg/dL (ref 8.4–10.5)
Chloride: 107 mEq/L (ref 96–112)
Creatinine, Ser: 1.03 mg/dL (ref 0.40–1.20)
GFR: 53.14 mL/min — ABNORMAL LOW (ref >60.00)
Glucose, Bld: 143 mg/dL — ABNORMAL HIGH (ref 70–99)
Potassium: 3.7 mEq/L (ref 3.5–5.1)
Sodium: 142 mEq/L (ref 135–145)
Total Bilirubin: 0.4 mg/dL (ref 0.2–1.2)
Total Protein: 6.8 g/dL (ref 6.0–8.3)

## 2019-08-16 LAB — CBC
HCT: 43 % (ref 36.0–46.0)
Hemoglobin: 15 g/dL (ref 12.0–15.0)
MCHC: 34.9 g/dL (ref 30.0–36.0)
MCV: 97.1 fl (ref 78.0–100.0)
Platelets: 248 10*3/uL (ref 150.0–400.0)
RBC: 4.42 Mil/uL (ref 3.87–5.11)
RDW: 13 % (ref 11.5–15.5)
WBC: 7.4 10*3/uL (ref 4.0–10.5)

## 2019-08-16 LAB — TSH: TSH: 1.31 u[IU]/mL (ref 0.35–4.50)

## 2019-08-16 NOTE — Progress Notes (Signed)
Chief Complaint  Patient presents with  . Allergic Reaction    Subjective: Patient is a 68 y.o. female here for irritant exposure.  Pt works in an office at [employer]. She has a reaction to Dust Off, something that is used to clean around the office. She states this causes her heart to beat rapidly, blood pressure to rise (which in turn increases ringing in her ears) and confusion/light headedness when she is exposed to it. In the past, this was less of an issue since it was used intermittently, but over the past week, it has been used daily. It took her 2.5 days to resolve her s/s's over the weekend. Her heart is still beating rapidly, but her BP has gotten better since using goggles and a N95 mask. No fevers, cough, sob, wheezing.   Past Medical History:  Diagnosis Date  . Anxiety   . Arthritis 02/14/2013  . Bipolar disorder ([place])   . Esophageal reflux 09/19/2012   only slight with gallbladder problems  . Essential hypertension 09/28/2018  . Hypoglycemia   . Hypothyroidism   . Other malaise and fatigue 09/19/2012  . PONV (postoperative nausea and vomiting)   . Preventative health care 06/09/2016  . PTSD (post-traumatic stress disorder)   . Thyroid disease     Objective: BP 128/80 (BP Location: Left Arm, Patient Position: Sitting, Cuff Size: Normal)   Pulse (!) 124   Temp 98.9 F (37.2 C) (Oral)   Ht 5\' 7"  (1.702 m)   Wt 204 lb 8 oz (92.8 kg)   SpO2 97%   BMI 32.03 kg/m  General: Awake, appears stated age HEENT: MMM, EOMi Heart: Reg rhythm, tachycardic, no LE edema Lungs: CTAB, no rales, wheezes or rhonchi. No accessory muscle use Psych: Age appropriate judgment and insight, normal affect and mood  Assessment and Plan: Exposure to respiratory irritant  Tachycardia - Plan: CBC, Comprehensive metabolic panel, TSH  1- I don't think this is an allergic rxn, rather similar to huffing, a hypersensitivity to a substance (probably difluoroethane). I don't think her N95 mask is  fully fitting over her face. She needs to make sure it is fitted around her face or get a different size. It does not make sense that this would be able to get through the mask to cause s/s's. It does not sound like an allergic reaction or related to inflammation in the lungs. 2. Ck labs, stay hydrated, likely 2/2 #1.  Letter for work given requesting she find a better fitting N95 or work in a room where this product is not used. OK to fill out AVA paperwork.  F/u in 2-3 weeks w Dr. Charlett Blake.  The patient voiced understanding and agreement to the plan.  [place], DO 08/16/19  1:28 PM

## 2019-08-16 NOTE — Telephone Encounter (Signed)
Appointment today with Dr. Nani Ravens

## 2019-08-16 NOTE — Telephone Encounter (Signed)
Nurse Assessment Nurse: Mancel Bale, RN, Butch Penny Date/Time Eilene Ghazi Time): 08/13/2019 1:24:18 PM Confirm and document reason for call. If symptomatic, describe symptoms. ---Caller states she has had a repeated exposure to an aerosol chemical at work that is causing her to have an irregular heartbeat and elevated blood pressure. States she is also have cognition issues. Has the patient had close contact with a person known or suspected to have the novel coronavirus illness OR traveled / lives in area with major community spread (including international travel) in the last 14 days from the onset of symptoms? * If Asymptomatic, screen for exposure and travel within the last 14 days. ---No Does the patient have any new or worsening symptoms? ---Yes Will a triage be completed? ---Yes Related visit to physician within the last 2 weeks? ---No Does the PT have any chronic conditions? (i.e. diabetes, asthma, this includes High risk factors for pregnancy, etc.) ---Yes List chronic conditions. ---Hypothyroidism, Bi-polar Is this a behavioral health or substance abuse call? ---NoPLEASE NOTE: All timestamps contained within this report are represented as [place] Standard Time. CONFIDENTIALTY NOTICE: This fax transmission is intended only for the addressee. It contains information that is legally privileged, confidential or otherwise protected from use or disclosure. If you are not the intended recipient, you are strictly prohibited from reviewing, disclosing, copying using or disseminating any of this information or taking any action in reliance on or regarding this information. If you have received this fax in error, please notify us immediately by telephone so that we can arrange for its return to [place]. Phone: [phone], Toll-Free: [phone], Fax: [phone] Page: 2 of 2 Call Id: [id number] Guidelines Guideline Title Affirmed Question Affirmed Notes Nurse Date/Time Eilene Ghazi Time) Heart Rate and  Heartbeat Questions [1] Dizziness, lightheadedness, or weakness AND [2] heart beating very rapidly (e.g., > 140 / minute) Mancel Bale, RN, Butch Penny 08/13/2019 1:27:34 PM Disp. Time Eilene Ghazi Time) Disposition Final User 08/13/2019 1:38:22 PM 911 Outcome Documentation Mancel Bale, RN, Butch Penny Reason: Pt did not answer. 08/13/2019 1:33:16 PM Call EMS 911 Now Yes Mancel Bale, RN, [place] Disagree/Comply Comply Caller Understands Yes PreDisposition Did not know what to do Care Advice Given Per Guideline CALL EMS 911 NOW: * Immediate medical attention is needed. You need to hang up and call 911 (or an ambulance). * Triager Discretion: I'll call you back in a few minutes to be sure you were able to reach them. Referrals GO TO FACILITY OTHER - SPECIFY

## 2019-08-16 NOTE — Patient Instructions (Addendum)
Get me an AVA form if needed.  See about getting your mask better fitted for your face.   Give [place] 2-3 business days to get the results of your labs back.   Let us know if you need anything.

## 2019-08-17 MED FILL — SYNTHROID 125 MCG TABLET: 125 | 60 days supply | Qty: 60 | Fill #2

## 2019-08-24 MED FILL — risperiDONE 2 MG TABS: 2 | 90 days supply | Qty: 90 | Fill #0

## 2019-08-24 MED FILL — AMITRIPTYLINE HCL 50 MG TAB: 50 | 90 days supply | Qty: 90 | Fill #0

## 2019-09-01 MED FILL — VIT D2 1.25 MG (50,000 UNIT: 1.25 MG | 28 days supply | Qty: 4 | Fill #2

## 2019-09-21 MED FILL — CARBATROL 200 MG CAPSULE SA: 200 | 90 days supply | Qty: 180 | Fill #0

## 2019-09-27 MED FILL — VIT D2 1.25 MG (50,000 UNIT: 1.25 MG | 28 days supply | Qty: 4 | Fill #3

## 2019-09-30 ENCOUNTER — Telehealth: Payer: Self-pay | Admitting: Allergy & Immunology

## 2019-09-30 NOTE — Telephone Encounter (Signed)
Patient called requesting to speak with Dr, Ernst Bowler regarding questions about getting the COVID vaccine. Her boss, Louie Casa, recommended she talk to Dr. Ernst Bowler before getting the shot. Patient has had many reactions to medications and is very sensitive to certain medications.   Patient states she has not had a bowel prep, but every year the flu vaccine gives her a headache for a long period of time.  Please advise.

## 2019-09-30 NOTE — Telephone Encounter (Signed)
I can imagine she has never had a bowel prep for colonoscopy.  She is nearly 70.  The headache is not really an IgE mediated reaction, so do not think testing would really answer the question were asking.  I think with her situation, the best thing to do would be to schedule her to receive a COVID-19 vaccine under our supervision without the testing.  Can someone call and get more clarification about this bowel prep?  Salvatore Marvel, MD [facility]

## 2019-10-03 NOTE — Telephone Encounter (Signed)
Called and spoke with patient and she did confirm that she has never had a colonoscopy and that she never wanted one. I did explain to her your recommendation of having her come in office and receiving the COVID vaccine in a controlled setting and she really enjoyed that offer. She stated that her boss told her he would let he have that day off to come into our office. Please advise.

## 2019-10-04 NOTE — Telephone Encounter (Signed)
Mel Almond would you mind calling this patient to see if she can be scheduled to come in on August 30th in [place]? Thank You.

## 2019-10-04 NOTE — Telephone Encounter (Signed)
Margaret [name], could you please contact this patient to schedule COVID component testing? Thank you.

## 2019-10-04 NOTE — Telephone Encounter (Signed)
I called Margaret [name] and she is scheduled for 12:00 on August 30, in [place]. I told her to stay off antihistamines. She said she doesn't take any.

## 2019-10-04 NOTE — Telephone Encounter (Signed)
I have some others scheduled for [facility] on August 30th. Can we put her in there somewhere?   Copying Johnette so we are all on the same page, as well as [place].   Salvatore Marvel, MD [facility]

## 2019-10-05 ENCOUNTER — Encounter (HOSPITAL_COMMUNITY): Payer: Self-pay | Admitting: Psychiatry

## 2019-10-05 ENCOUNTER — Other Ambulatory Visit: Payer: Self-pay

## 2019-10-05 ENCOUNTER — Other Ambulatory Visit (HOSPITAL_COMMUNITY): Payer: Self-pay | Admitting: Psychiatry

## 2019-10-05 ENCOUNTER — Telehealth (INDEPENDENT_AMBULATORY_CARE_PROVIDER_SITE_OTHER): Payer: No Typology Code available for payment source | Admitting: Psychiatry

## 2019-10-05 DIAGNOSIS — F419 Anxiety disorder, unspecified: Secondary | ICD-10-CM | POA: Diagnosis not present

## 2019-10-05 DIAGNOSIS — F319 Bipolar disorder, unspecified: Secondary | ICD-10-CM

## 2019-10-05 MED ORDER — CARBAMAZEPINE ER 200 MG PO CP12: 200.0000 mg | ORAL_CAPSULE | Freq: Two times a day (BID) | ORAL | 0 refills | Status: DC

## 2019-10-05 MED ORDER — AMITRIPTYLINE HCL 50 MG PO TABS: 50.0000 mg | ORAL_TABLET | Freq: Every day | ORAL | 0 refills | Status: DC

## 2019-10-05 MED ORDER — RISPERIDONE 2 MG PO TABS: 2.0000 mg | ORAL_TABLET | Freq: Every day | ORAL | 0 refills | Status: DC

## 2019-10-05 NOTE — Progress Notes (Signed)
Virtual Visit via Telephone Note  I connected with Margaret [name] on 10/05/19 at  8:40 AM EDT by telephone and verified that I am speaking with the correct person using two identifiers.  Location: Patient: home Provider: home office   I discussed the limitations, risks, security and privacy concerns of performing an evaluation and management service by telephone and the availability of in person appointments. I also discussed with the patient that there may be a patient responsible charge related to this service. The patient expressed understanding and agreed to proceed.   History of Present Illness: Patient is evaluated by phone session.  She is taking her medication as prescribed.  Since her thyroid medicine is adjusted she is feeling much better.  He does not have any episodes of confusion.  She believes her thinking is clear.  She is sleeping good.  She denies any highs and lows, mania, psychosis or any hallucination.  Her energy level is good.  She started walking every day and feeling that she had lost some weight since the last visit.  Her job is going well.  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  She has no tremors, shakes or any EPS.  She denies any suicidal thoughts.  She like to keep her current medication.  She had a blood work in July by her PCP but she did not remember having Tegretol level.  Patient apologized but promised to do the blood work for Tegretol level in September since she has upcoming visit with PCP.  She also seeing allergist and hoping if she is cleared then she can get the first dose of COVID.    Past Psychiatric History: H/O anxiety, bipolar d/o and inpatient in 2009. No h/o suicidal attempt. Lithium worked well but discontinued due to high creatinine.  Recent Results (from the past 2160 hour(s))  CBC     Status: None   Collection Time: 08/16/19  1:17 PM  Result Value Ref Range   WBC 7.4 4.0 - 10.5 K/uL   RBC 4.42 3.87 - 5.11 Mil/uL   Platelets  248.0 150 - 400 K/uL   Hemoglobin 15.0 12.0 - 15.0 g/dL   HCT 43.0 36 - 46 %   MCV 97.1 78.0 - 100.0 fl   MCHC 34.9 30.0 - 36.0 g/dL   RDW 13.0 11.5 - 15.5 %  Comprehensive metabolic panel     Status: Abnormal   Collection Time: 08/16/19  1:17 PM  Result Value Ref Range   Sodium 142 135 - 145 mEq/L   Potassium 3.7 3.5 - 5.1 mEq/L   Chloride 107 96 - 112 mEq/L   CO2 25 19 - 32 mEq/L   Glucose, Bld 143 (H) 70 - 99 mg/dL   BUN 25 (H) 6 - 23 mg/dL   Creatinine, Ser 1.03 0.40 - 1.20 mg/dL   Total Bilirubin 0.4 0.2 - 1.2 mg/dL   Alkaline Phosphatase 94 39 - 117 U/L   AST 14 0 - 37 U/L   ALT 15 0 - 35 U/L   Total Protein 6.8 6.0 - 8.3 g/dL   Albumin 4.7 3.5 - 5.2 g/dL   GFR 53.14 (L) >60.00 mL/min   Calcium 9.9 8.4 - 10.5 mg/dL  TSH     Status: None   Collection Time: 08/16/19  1:17 PM  Result Value Ref Range   TSH 1.31 0.35 - 4.50 uIU/mL     Psychiatric Specialty Exam: Physical Exam  Review of Systems  There were no vitals  taken for this visit.There is no height or weight on file to calculate BMI.  General Appearance: NA  Eye Contact:  NA  Speech:  fast but clear  Volume:  Normal  Mood:  Euthymic  Affect:  NA  Thought Process:  Coherent  Orientation:  Full (Time, Place, and Person)  Thought Content:  Logical  Suicidal Thoughts:  No  Homicidal Thoughts:  No  Memory:  Immediate;   Good Recent;   Good Remote;   Good  Judgement:  Intact  Insight:  Present  Psychomotor Activity:  NA  Concentration:  Concentration: Good and Attention Span: Good  Recall:  Good  Fund of Knowledge:  Good  Language:  Good  Akathisia:  No  Handed:  Right  AIMS (if indicated):     Assets:  Communication Skills Desire for Improvement Housing Resilience Talents/Skills Transportation  ADL's:  Intact  Cognition:  WNL  Sleep:   good      Assessment and Plan: Bipolar disorder type I.  Anxiety.  Her blood glucose level is 143 and BUN 25.  However she had hemoglobin A1c back in May  which was normal.  Her creatinine is 1.03.  Discussed medication side effects and benefits.  She like to continue her current medication.  Continue carbamazepine 200 mg twice a day, Risperdal 2 mg at bedtime and amitriptyline 50 mg at bedtime.  I will forward my note to her PCP Dr. Willette Alma to had Tegretol level on her next appointment with PCP.  I recommended to call us back if she is any question or any concern.  Follow-up in 3 months.    I reviewed blood work results.  Follow Up Instructions:    I discussed the assessment and treatment plan with the patient. The patient was provided an opportunity to ask questions and all were answered. The patient agreed with the plan and demonstrated an understanding of the instructions.   The patient was advised to call back or seek an in-person evaluation if the symptoms worsen or if the condition fails to improve as anticipated.  I provided 16 minutes of non-face-to-face time during this encounter.   [name], MD

## 2019-10-10 ENCOUNTER — Encounter: Payer: No Typology Code available for payment source | Admitting: Allergy & Immunology

## 2019-10-26 ENCOUNTER — Other Ambulatory Visit: Payer: Self-pay | Admitting: Family Medicine

## 2019-10-26 MED FILL — SYNTHROID 125 MCG TABLET: 125 | 60 days supply | Qty: 60 | Fill #0

## 2019-10-26 MED FILL — VIT D2 1.25 MG (50,000 UNIT: 1.25 MG | 28 days supply | Qty: 4 | Fill #4

## 2019-10-27 ENCOUNTER — Ambulatory Visit (INDEPENDENT_AMBULATORY_CARE_PROVIDER_SITE_OTHER): Payer: No Typology Code available for payment source | Admitting: Family Medicine

## 2019-10-27 ENCOUNTER — Other Ambulatory Visit: Payer: Self-pay

## 2019-10-27 VITALS — BP 126/70 | HR 83 | Temp 96.4°F | Resp 13 | Ht 67.0 in | Wt 213.4 lb

## 2019-10-27 DIAGNOSIS — F319 Bipolar disorder, unspecified: Secondary | ICD-10-CM | POA: Diagnosis not present

## 2019-10-27 DIAGNOSIS — Z7189 Other specified counseling: Secondary | ICD-10-CM

## 2019-10-27 DIAGNOSIS — E559 Vitamin D deficiency, unspecified: Secondary | ICD-10-CM | POA: Diagnosis not present

## 2019-10-27 DIAGNOSIS — E039 Hypothyroidism, unspecified: Secondary | ICD-10-CM | POA: Diagnosis not present

## 2019-10-27 DIAGNOSIS — E785 Hyperlipidemia, unspecified: Secondary | ICD-10-CM

## 2019-10-27 DIAGNOSIS — I10 Essential (primary) hypertension: Secondary | ICD-10-CM

## 2019-10-27 DIAGNOSIS — N289 Disorder of kidney and ureter, unspecified: Secondary | ICD-10-CM

## 2019-10-27 DIAGNOSIS — R739 Hyperglycemia, unspecified: Secondary | ICD-10-CM

## 2019-10-27 DIAGNOSIS — K219 Gastro-esophageal reflux disease without esophagitis: Secondary | ICD-10-CM

## 2019-10-27 NOTE — Patient Instructions (Signed)

## 2019-10-27 NOTE — Assessment & Plan Note (Signed)
On Levothyroxine, continue to monitor 

## 2019-10-27 NOTE — Assessment & Plan Note (Signed)
Well controlled, no changes to meds. Encouraged heart healthy diet such as the DASH diet and exercise as tolerated.  °

## 2019-10-27 NOTE — Assessment & Plan Note (Signed)
hgba1c acceptable, minimize simple carbs. Increase exercise as tolerated.  

## 2019-10-27 NOTE — Assessment & Plan Note (Signed)
Supplement and monitor 

## 2019-10-27 NOTE — Assessment & Plan Note (Signed)
hydrate and monitor  

## 2019-10-27 NOTE — Assessment & Plan Note (Signed)
Encouraged heart healthy diet, increase exercise, avoid trans fats, consider a krill oil cap daily 

## 2019-10-27 NOTE — Assessment & Plan Note (Signed)
Avoid offending foods, start probiotics. Do not eat large meals in late evening and consider raising head of bed.  

## 2019-10-27 NOTE — Progress Notes (Signed)
Subjective:    Patient ID: Margaret [name], female    DOB: [birth year], 69 y.o.   MRN: [id number]  Chief Complaint  Patient presents with  . followup 4 months    Room 5    HPI Patient is in today for follow up on chronic medical concerns. No recent febrile illness or hospitalizations. She is excited about starting a new job at [employer] soon. She has chosen not to take any COVID vaccines but she is masking some. Denies CP/palp/SOB/HA/congestion/fevers/GI or GU c/o. Taking meds as prescribed  Past Medical History:  Diagnosis Date  . Anxiety   . Arthritis 02/14/2013  . Bipolar disorder ([place])   . Esophageal reflux 09/19/2012   only slight with gallbladder problems  . Essential hypertension 09/28/2018  . Hypoglycemia   . Hypothyroidism   . Other malaise and fatigue 09/19/2012  . PONV (postoperative nausea and vomiting)   . Preventative health care 06/09/2016  . PTSD (post-traumatic stress disorder)   . Thyroid disease     Past Surgical History:  Procedure Laterality Date  . CHOLECYSTECTOMY N/A 09/01/2016   Procedure: LAPAROSCOPIC CHOLECYSTECTOMY WITH INTRAOPERATIVE CHOLANGIOGRAM;  Surgeon: Jovita Kussmaul, MD;  Location: [facility];  Service: General;  Laterality: N/A;  . 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 fooet surgery Left 2014   reconstruction of foot and ankle to correct deformity  . NECK SURGERY    . OTHER SURGICAL HISTORY    . OTHER SURGICAL HISTORY    . OTHER SURGICAL HISTORY    . TUBAL LIGATION     reversal    Family History  Problem Relation Age of Onset  . Diabetes Mother        Brother 1 of 2  . Heart failure Mother   . Prostate cancer Father   . Parkinsonism Father        deceased  . Hypertension Brother   . Esophageal cancer Maternal Grandmother   . Cancer Paternal Grandmother        cancer of jawbone 1 of 2  . Melanoma Brother   . Breast cancer Neg Hx   . Colon cancer Neg Hx     Social History   Socioeconomic History  . Marital status: Divorced     Spouse name: Not on file  . Number of children: Not on file  . Years of education: Not on file  . Highest education level: Not on file  Occupational History  . Not on file  Tobacco Use  . Smoking status: Former Smoker    Packs/day: 3.00    Years: 24.00    Pack years: 72.00    Types: Cigarettes    Quit date: 1995    Years since quitting: 26.7  . Smokeless tobacco: Never Used  Vaping Use  . Vaping Use: Never used  Substance and Sexual Activity  . Alcohol use: No    Alcohol/week: 0.0 standard drinks  . Drug use: No  . Sexual activity: Not on file  Other Topics Concern  . Not on file  Social History Narrative  . Not on file   Social Determinants of Health   Financial Resource Strain:   . Difficulty of Paying Living Expenses: Not on file  Food Insecurity:   . Worried About Charity fundraiser in the Last Year: Not on file  . Ran Out of Food in the Last Year: Not on file  Transportation Needs:   . Lack of Transportation (Medical): Not  on file  . Lack of Transportation (Non-Medical): Not on file  Physical Activity:   . Days of Exercise per Week: Not on file  . Minutes of Exercise per Session: Not on file  Stress:   . Feeling of Stress : Not on file  Social Connections:   . Frequency of Communication with Friends and Family: Not on file  . Frequency of Social Gatherings with Friends and Family: Not on file  . Attends Religious Services: Not on file  . Active Member of Clubs or Organizations: Not on file  . Attends Archivist Meetings: Not on file  . Marital Status: Not on file  Intimate Partner Violence:   . Fear of Current or Ex-Partner: Not on file  . Emotionally Abused: Not on file  . Physically Abused: Not on file  . Sexually Abused: Not on file    Outpatient Medications Prior to Visit  Medication Sig Dispense Refill  . amitriptyline (ELAVIL) 50 MG tablet Take 1 tablet (50 mg total) by mouth at bedtime. 90 tablet 0  . carbamazepine (CARBATROL) 200 MG  12 hr capsule Take 1 capsule (200 mg total) by mouth 2 (two) times daily. 180 capsule 0  . risperiDONE (RISPERDAL) 2 MG tablet Take 1 tablet (2 mg total) by mouth at bedtime. 90 tablet 0  . SYNTHROID 125 MCG tablet TAKE 1 TABLET (125 MCG TOTAL) BY MOUTH DAILY. 60 tablet 1  . Vitamin D, Ergocalciferol, (DRISDOL) 1.25 MG (50000 UNIT) CAPS capsule Take 1 capsule (50,000 Units total) by mouth every 7 (seven) days. 4 capsule 4   No facility-administered medications prior to visit.    Allergies  Allergen Reactions  . Novocain [Procaine] Other (See Comments)    Heart race  . Codeine Nausea Only  . Darvon Other (See Comments)    Hallucinations   . Sulfa Drugs Cross Reactors Nausea And Vomiting    Review of Systems  Constitutional: Negative for fever and malaise/fatigue.  HENT: Negative for congestion.   Eyes: Negative for blurred vision.  Respiratory: Negative for shortness of breath.   Cardiovascular: Negative for chest pain, palpitations and leg swelling.  Gastrointestinal: Negative for abdominal pain, blood in stool and nausea.  Genitourinary: Negative for dysuria and frequency.  Musculoskeletal: Negative for falls.  Skin: Negative for rash.  Neurological: Negative for dizziness, loss of consciousness and headaches.  Endo/Heme/Allergies: Negative for environmental allergies.  Psychiatric/Behavioral: Negative for depression. The patient is not nervous/anxious.        Objective:    Physical Exam Vitals and nursing note reviewed.  Constitutional:      General: She is not in acute distress.    Appearance: She is well-developed.  HENT:     Head: Normocephalic and atraumatic.     Nose: Nose normal.  Eyes:     General:        Right eye: No discharge.        Left eye: No discharge.  Cardiovascular:     Rate and Rhythm: Normal rate and regular rhythm.     Heart sounds: No murmur heard.   Pulmonary:     Effort: Pulmonary effort is normal.     Breath sounds: Normal breath  sounds.  Abdominal:     General: Bowel sounds are normal.     Palpations: Abdomen is soft.     Tenderness: There is no abdominal tenderness.  Musculoskeletal:     Cervical back: Normal range of motion and neck supple.  Skin:    General:  Skin is warm and dry.  Neurological:     Mental Status: She is alert and oriented to person, place, and time.     BP 126/70 (BP Location: Left Arm, Patient Position: Sitting, Cuff Size: Small)   Pulse 83   Temp (!) 96.4 F (35.8 C) (Oral)   Resp 13   Ht 5\' 7"  (1.702 m)   Wt 213 lb 6.4 oz (96.8 kg)   SpO2 100%   BMI 33.42 kg/m  Wt Readings from Last 3 Encounters:  10/27/19 213 lb 6.4 oz (96.8 kg)  08/16/19 204 lb 8 oz (92.8 kg)  06/23/19 202 lb (91.6 kg)    Diabetic Foot Exam - Simple   No data filed     Lab Results  Component Value Date   WBC 6.0 10/27/2019   HGB 15.5 10/27/2019   HCT 45.3 (H) 10/27/2019   PLT 209 10/27/2019   GLUCOSE 97 10/27/2019   CHOL 264 (H) 10/27/2019   TRIG 258 (H) 10/27/2019   HDL 51 10/27/2019   LDLDIRECT 131.0 03/24/2019   LDLCALC 171 (H) 10/27/2019   ALT 13 10/27/2019   AST 10 10/27/2019   NA 141 10/27/2019   K 4.6 10/27/2019   CL 106 10/27/2019   CREATININE 0.97 10/27/2019   BUN 23 10/27/2019   CO2 27 10/27/2019   TSH 2.00 10/27/2019   HGBA1C 6.0 (H) 10/27/2019    Lab Results  Component Value Date   TSH 2.00 10/27/2019   Lab Results  Component Value Date   WBC 6.0 10/27/2019   HGB 15.5 10/27/2019   HCT 45.3 (H) 10/27/2019   MCV 95.8 10/27/2019   PLT 209 10/27/2019   Lab Results  Component Value Date   NA 141 10/27/2019   K 4.6 10/27/2019   CO2 27 10/27/2019   GLUCOSE 97 10/27/2019   BUN 23 10/27/2019   CREATININE 0.97 10/27/2019   BILITOT 0.4 10/27/2019   ALKPHOS 94 08/16/2019   AST 10 10/27/2019   ALT 13 10/27/2019   PROT 6.5 10/27/2019   ALBUMIN 4.7 08/16/2019   CALCIUM 9.9 10/27/2019   ANIONGAP 4 (L) 08/26/2016   GFR 53.14 (L) 08/16/2019   Lab Results  Component  Value Date   CHOL 264 (H) 10/27/2019   Lab Results  Component Value Date   HDL 51 10/27/2019   Lab Results  Component Value Date   LDLCALC 171 (H) 10/27/2019   Lab Results  Component Value Date   TRIG 258 (H) 10/27/2019   Lab Results  Component Value Date   CHOLHDL 5.2 (H) 10/27/2019   Lab Results  Component Value Date   HGBA1C 6.0 (H) 10/27/2019       Assessment & Plan:   Problem List Items Addressed This Visit    Hypothyroidism    On Levothyroxine, continue to monitor      Bipolar disorder ([place]) - Primary   Relevant Orders   Carbamazepine Level (Tegretol), total (Completed)   Hyperlipidemia    Encouraged heart healthy diet, increase exercise, avoid trans fats, consider a krill oil cap daily.      Relevant Orders   Lipid panel (Completed)   Hyperglycemia    hgba1c acceptable, minimize simple carbs. Increase exercise as tolerated.       Relevant Orders   Hemoglobin A1c (Completed)   Esophageal reflux    Avoid offending foods, start probiotics. Do not eat large meals in late evening and consider raising head of bed.       Renal insufficiency  hydrate and monitor      Essential hypertension    Well controlled, no changes to meds. Encouraged heart healthy diet such as the DASH diet and exercise as tolerated.       Relevant Orders   CBC (Completed)   Comprehensive metabolic panel (Completed)   TSH (Completed)   Vitamin D deficiency    Supplement and monitor      Relevant Orders   VITAMIN D 25 Hydroxy (Vit-D Deficiency, Fractures) (Completed)   Educated about COVID-19 virus infection    She declines the COVID vaccine but does engage in conversation regarding the need for vaccination and how much safer it is than getting COVID         I am having Moksha D. Haris maintain her Vitamin D (Ergocalciferol), risperiDONE, carbamazepine, amitriptyline, and Synthroid.  No orders of the defined types were placed in this encounter.    Penni Homans, MD

## 2019-10-28 DIAGNOSIS — Z7189 Other specified counseling: Secondary | ICD-10-CM | POA: Insufficient documentation

## 2019-10-28 LAB — COMPREHENSIVE METABOLIC PANEL
AG Ratio: 2.3 (calc) (ref 1.0–2.5)
ALT: 13 U/L (ref 6–29)
AST: 10 U/L (ref 10–35)
Albumin: 4.5 g/dL (ref 3.6–5.1)
Alkaline phosphatase (APISO): 95 U/L (ref 37–153)
BUN: 23 mg/dL (ref 7–25)
CO2: 27 mmol/L (ref 20–32)
Calcium: 9.9 mg/dL (ref 8.6–10.4)
Chloride: 106 mmol/L (ref 98–110)
Creat: 0.97 mg/dL (ref 0.50–0.99)
Globulin: 2 g/dL (calc) (ref 1.9–3.7)
Glucose, Bld: 97 mg/dL (ref 65–99)
Potassium: 4.6 mmol/L (ref 3.5–5.3)
Sodium: 141 mmol/L (ref 135–146)
Total Bilirubin: 0.4 mg/dL (ref 0.2–1.2)
Total Protein: 6.5 g/dL (ref 6.1–8.1)

## 2019-10-28 LAB — CBC
HCT: 45.3 % — ABNORMAL HIGH (ref 35.0–45.0)
Hemoglobin: 15.5 g/dL (ref 11.7–15.5)
MCH: 32.8 pg (ref 27.0–33.0)
MCHC: 34.2 g/dL (ref 32.0–36.0)
MCV: 95.8 fL (ref 80.0–100.0)
MPV: 8.9 fL (ref 7.5–12.5)
Platelets: 209 10*3/uL (ref 140–400)
RBC: 4.73 10*6/uL (ref 3.80–5.10)
RDW: 12.6 % (ref 11.0–15.0)
WBC: 6 10*3/uL (ref 3.8–10.8)

## 2019-10-28 LAB — LIPID PANEL
Cholesterol: 264 mg/dL — ABNORMAL HIGH (ref <200)
HDL: 51 mg/dL (ref >=50)
LDL Cholesterol (Calc): 171 mg/dL (calc) — ABNORMAL HIGH
Non-HDL Cholesterol (Calc): 213 mg/dL (calc) — ABNORMAL HIGH (ref <130)
Total CHOL/HDL Ratio: 5.2 (calc) — ABNORMAL HIGH (ref <5.0)
Triglycerides: 258 mg/dL — ABNORMAL HIGH (ref <150)

## 2019-10-28 LAB — HEMOGLOBIN A1C
Hgb A1c MFr Bld: 6 % of total Hgb — ABNORMAL HIGH (ref <5.7)
Mean Plasma Glucose: 126 (calc)
eAG (mmol/L): 7 (calc)

## 2019-10-28 LAB — VITAMIN D 25 HYDROXY (VIT D DEFICIENCY, FRACTURES): Vit D, 25-Hydroxy: 65 ng/mL (ref 30–100)

## 2019-10-28 LAB — TSH: TSH: 2 mIU/L (ref 0.40–4.50)

## 2019-10-28 LAB — CARBAMAZEPINE LEVEL, TOTAL: Carbamazepine Lvl: 6.7 mg/L (ref 4.0–12.0)

## 2019-10-28 NOTE — Assessment & Plan Note (Signed)
She declines the COVID vaccine but does engage in conversation regarding the need for vaccination and how much safer it is than getting COVID

## 2019-11-02 ENCOUNTER — Telehealth: Payer: Self-pay

## 2019-11-02 NOTE — Telephone Encounter (Signed)
Patient notified and declined the Atorvastatin 10 mg.  Patient states already has a scheduled appointment in March and will follow up then.  Patient will try to manage diet to get the levels down.

## 2019-11-21 ENCOUNTER — Telehealth: Payer: Self-pay | Admitting: Family Medicine

## 2019-11-21 NOTE — Telephone Encounter (Signed)
  Patient states she would like to know what strength of vitamin D she should take.

## 2019-11-22 MED FILL — risperiDONE 2 MG TABS: 2 | 90 days supply | Qty: 90 | Fill #0

## 2019-11-22 MED FILL — AMITRIPTYLINE HCL 50 MG TAB: 50 | 90 days supply | Qty: 90 | Fill #0

## 2019-11-22 NOTE — Telephone Encounter (Signed)
Have her start on Vitamin D 2000 IU daily

## 2019-11-23 NOTE — Telephone Encounter (Signed)
Patient notified and will take Vitamin D 2000 iu as provider has requested, without a problem.

## 2019-12-19 ENCOUNTER — Other Ambulatory Visit (HOSPITAL_COMMUNITY): Payer: Self-pay | Admitting: Psychiatry

## 2019-12-19 DIAGNOSIS — F319 Bipolar disorder, unspecified: Secondary | ICD-10-CM

## 2019-12-19 MED FILL — SYNTHROID 125 MCG TABLET: 125 | 30 days supply | Qty: 30 | Fill #1 | Status: TO

## 2019-12-19 MED FILL — CARBATROL 200 MG CAPSULE SA: 200 | 30 days supply | Qty: 60 | Fill #0 | Status: TO

## 2020-01-02 ENCOUNTER — Telehealth (HOSPITAL_COMMUNITY): Payer: Self-pay

## 2020-01-02 ENCOUNTER — Telehealth (HOSPITAL_COMMUNITY): Payer: No Typology Code available for payment source | Admitting: Psychiatry

## 2020-01-02 NOTE — Telephone Encounter (Signed)
Patient had called requesting a formulary exception letter for Medicare. I called patient back to speak with her. She didn't pick up so I LVM to suggest she call her insurance company to see if there's anything they would need to fax to [place]. I left her our fax number

## 2020-01-23 MED FILL — SYNTHROID 125 MCG TABLET: 125 | 30 days supply | Qty: 30 | Fill #2 | Status: TO

## 2020-01-23 MED FILL — CARBATROL 200 MG CAPSULE SA: 200 | 30 days supply | Qty: 60 | Fill #1 | Status: TO

## 2020-02-16 ENCOUNTER — Telehealth (HOSPITAL_COMMUNITY): Payer: Medicare Other | Admitting: Psychiatry

## 2020-02-16 DIAGNOSIS — F319 Bipolar disorder, unspecified: Secondary | ICD-10-CM | POA: Diagnosis not present

## 2020-02-16 DIAGNOSIS — L309 Dermatitis, unspecified: Secondary | ICD-10-CM | POA: Diagnosis not present

## 2020-02-16 DIAGNOSIS — Z008 Encounter for other general examination: Secondary | ICD-10-CM | POA: Diagnosis not present

## 2020-02-16 DIAGNOSIS — E669 Obesity, unspecified: Secondary | ICD-10-CM | POA: Diagnosis not present

## 2020-02-16 DIAGNOSIS — M549 Dorsalgia, unspecified: Secondary | ICD-10-CM | POA: Diagnosis not present

## 2020-02-16 DIAGNOSIS — E039 Hypothyroidism, unspecified: Secondary | ICD-10-CM | POA: Diagnosis not present

## 2020-02-16 DIAGNOSIS — Z6832 Body mass index (BMI) 32.0-32.9, adult: Secondary | ICD-10-CM | POA: Diagnosis not present

## 2020-02-20 ENCOUNTER — Telehealth (INDEPENDENT_AMBULATORY_CARE_PROVIDER_SITE_OTHER): Payer: Medicare HMO | Admitting: Psychiatry

## 2020-02-20 ENCOUNTER — Other Ambulatory Visit: Payer: Self-pay

## 2020-02-20 ENCOUNTER — Other Ambulatory Visit (HOSPITAL_COMMUNITY): Payer: Self-pay | Admitting: Psychiatry

## 2020-02-20 ENCOUNTER — Encounter (HOSPITAL_COMMUNITY): Payer: Self-pay | Admitting: Psychiatry

## 2020-02-20 DIAGNOSIS — F419 Anxiety disorder, unspecified: Secondary | ICD-10-CM | POA: Diagnosis not present

## 2020-02-20 DIAGNOSIS — F319 Bipolar disorder, unspecified: Secondary | ICD-10-CM

## 2020-02-20 MED ORDER — CARBAMAZEPINE ER 200 MG PO CP12: ORAL_CAPSULE | ORAL | 2 refills | Status: DC

## 2020-02-20 MED ORDER — AMITRIPTYLINE HCL 50 MG PO TABS: 50.0000 mg | ORAL_TABLET | Freq: Every day | ORAL | 2 refills | Status: DC

## 2020-02-20 MED ORDER — RISPERIDONE 2 MG PO TABS: 2.0000 mg | ORAL_TABLET | Freq: Every day | ORAL | 2 refills | Status: DC

## 2020-02-20 MED FILL — SYNTHROID 125 MCG TABLET: 125 | 30 days supply | Qty: 30 | Fill #1

## 2020-02-20 MED FILL — CARBATROL 200 MG CAPSULE SA: 200 | 30 days supply | Qty: 60 | Fill #2 | Status: TO

## 2020-02-20 MED FILL — AMITRIPTYLINE HCL 50 MG TAB: 50 | 30 days supply | Qty: 30 | Fill #0

## 2020-02-20 MED FILL — risperiDONE 2 MG TABS: 2 | 30 days supply | Qty: 30 | Fill #0

## 2020-02-20 NOTE — Progress Notes (Signed)
Virtual Visit via Telephone Note  I connected with Margaret [name] on 02/20/20 at  1:40 PM EST by telephone and verified that I am speaking with the correct person using two identifiers.  Location: Patient: home Provider: Home office   I discussed the limitations, risks, security and privacy concerns of performing an evaluation and management service by telephone and the availability of in person appointments. I also discussed with the patient that there may be a patient responsible charge related to this service. The patient expressed understanding and agreed to proceed.   History of Present Illness: Patient is evaluated by phone session.  She missed last appointment and apologized because she have issues with her insurance.  She is taking her medication as prescribed.  She noticed since she changed her insurance her Tegretol is expensive but she does not want to try a different medication since it did not work very well.  She denies any mania, psychosis, hallucination.  She denies any anxiety or panic attack.  She had a good Christmas with the family.  Currently she is not working but looking for a part-time job that helps [employer].  She has no tremors, shakes or any EPS.  Her appetite is okay.  Her weight is stable.  She denies any impulsivity.  She lives with her dog.  Past Psychiatric History: H/O anxiety, bipolar d/o and inpatient in 2009. No h/o suicidal attempt. Lithium worked well but discontinued due to high creatinine.   Psychiatric Specialty Exam: Physical Exam  Review of Systems  Weight 206 lb (93.4 kg).Body mass index is 32.26 kg/m.  General Appearance: NA  Eye Contact:  NA  Speech:  Normal Rate  Volume:  Normal  Mood:  Anxious  Affect:  NA  Thought Process:  Descriptions of Associations: Intact  Orientation:  Full (Time, Place, and Person)  Thought Content:  Logical  Suicidal Thoughts:  No  Homicidal Thoughts:  No  Memory:  Immediate;   Good Recent;    Good Remote;   Good  Judgement:  Intact  Insight:  Present  Psychomotor Activity:  NA  Concentration:  Concentration: Fair and Attention Span: Fair  Recall:  [place] of Knowledge:  Good  Language:  Good  Akathisia:  No  Handed:  Right  AIMS (if indicated):     Assets:  Communication Skills Desire for [place] Talents/Skills Transportation  ADL's:  Intact  Cognition:  WNL  Sleep:   ok      Assessment and Plan: Bipolar disorder type I.  Anxiety.  Patient is a stable on her current medication.  I recommend to try a good Rx coupon to get carbamazepine as she does not want to change the medication.  She is feeling good and stable.  Continue carbamazepine 200 mg twice a day, Risperdal 2 mg at bedtime and amitriptyline 50 mg at bedtime.  Her last Tegretol level was 6.7 which was done on September 16.  Her hemoglobin A1c is 6.0.  Discussed medication side effects and benefits.  Continue amitriptyline 50 mg at bedtime, Risperdal 2 mg at bedtime and Tegretol 200 mg twice a day.  Recommended to call us back if she has any question or any concern.  Follow-up in 3 months.  Follow Up Instructions:    I discussed the assessment and treatment plan with the patient. The patient was provided an opportunity to ask questions and all were answered. The patient agreed with the plan and demonstrated an understanding of  the instructions.   The patient was advised to call back or seek an in-person evaluation if the symptoms worsen or if the condition fails to improve as anticipated.  I provided 15 minutes of non-face-to-face time during this encounter.   [name], MD

## 2020-02-24 DIAGNOSIS — Z79899 Other long term (current) drug therapy: Secondary | ICD-10-CM | POA: Diagnosis not present

## 2020-02-24 DIAGNOSIS — Z0001 Encounter for general adult medical examination with abnormal findings: Secondary | ICD-10-CM | POA: Diagnosis not present

## 2020-02-24 DIAGNOSIS — E559 Vitamin D deficiency, unspecified: Secondary | ICD-10-CM | POA: Diagnosis not present

## 2020-02-24 DIAGNOSIS — G8929 Other chronic pain: Secondary | ICD-10-CM | POA: Diagnosis not present

## 2020-02-24 DIAGNOSIS — M545 Low back pain, unspecified: Secondary | ICD-10-CM | POA: Diagnosis not present

## 2020-02-24 DIAGNOSIS — E669 Obesity, unspecified: Secondary | ICD-10-CM | POA: Diagnosis not present

## 2020-02-24 DIAGNOSIS — H6123 Impacted cerumen, bilateral: Secondary | ICD-10-CM | POA: Diagnosis not present

## 2020-02-24 DIAGNOSIS — Z008 Encounter for other general examination: Secondary | ICD-10-CM | POA: Diagnosis not present

## 2020-03-07 DIAGNOSIS — B351 Tinea unguium: Secondary | ICD-10-CM | POA: Diagnosis not present

## 2020-03-07 DIAGNOSIS — D2372 Other benign neoplasm of skin of left lower limb, including hip: Secondary | ICD-10-CM | POA: Diagnosis not present

## 2020-03-07 DIAGNOSIS — M2041 Other hammer toe(s) (acquired), right foot: Secondary | ICD-10-CM | POA: Diagnosis not present

## 2020-03-22 MED FILL — AMITRIPTYLINE HCL 50 MG TAB: 50 | 30 days supply | Qty: 30 | Fill #1

## 2020-03-22 MED FILL — SYNTHROID 125 MCG TABLET: 125 | 30 days supply | Qty: 30 | Fill #2

## 2020-03-22 MED FILL — risperiDONE 2 MG TABS: 2 | 30 days supply | Qty: 30 | Fill #1

## 2020-03-23 ENCOUNTER — Telehealth (HOSPITAL_COMMUNITY): Payer: Self-pay | Admitting: *Deleted

## 2020-03-23 MED FILL — CARBATROL 200 MG CAPSULE SA: 200 | 30 days supply | Qty: 60 | Fill #0

## 2020-03-23 NOTE — Telephone Encounter (Signed)
Thanks for information

## 2020-03-23 NOTE — Telephone Encounter (Signed)
PA submitted, denied, via CoverMyMeds for the Carbatrol 200mg . Writer spoke with pharmacy who agreed insurance will not pay without pt trying alternatives. Pt states that she will pay out of pocket as she does not want to change medications since the Carbatrol "works so well" for her. FYI.

## 2020-04-07 ENCOUNTER — Telehealth (INDEPENDENT_AMBULATORY_CARE_PROVIDER_SITE_OTHER): Payer: Medicare HMO | Admitting: Family Medicine

## 2020-04-07 DIAGNOSIS — K2970 Gastritis, unspecified, without bleeding: Secondary | ICD-10-CM

## 2020-04-07 DIAGNOSIS — R197 Diarrhea, unspecified: Secondary | ICD-10-CM | POA: Diagnosis not present

## 2020-04-07 MED ORDER — OMEPRAZOLE 40 MG PO CPDR: 40.0000 mg | DELAYED_RELEASE_CAPSULE | Freq: Every day | ORAL | 3 refills | Status: DC

## 2020-04-07 NOTE — Progress Notes (Signed)
[facility] [address], [place], [place] [address] [phone]  Date:  04/07/2020   Name:  Margaret [name]   DOB:  30-Dec-1950   MRN:  [id number]  PCP:  Mosie Lukes, MD    Chief Complaint: Abdominal Pain (Onset: 2 weeks ...nausea , diarrhea , bloating , fatigue)   History of Present Illness:  Margaret [name] is a 70 y.o. very pleasant female patient who presents with the following:  Patient with history of hypertension, GERD, hyperlipidemia, hypothyroidism, kidney stones, bipolar disorder Virtual visit today- pt location is home, provider is also at home Pt ID confirmed with 2 factors, she gives consent for virtual visit today.  The patient and myself are present on the call  Primary patient of my partner Dr. Archie Balboa visit with her May 2021, also seen by my partner Dr. Nani Ravens in July 2021 [facility] seen at [facility] today with concern of gastrointestinal symptoms  Current medications amitriptyline at bedtime, carbamazepine, Risperdal, Crestor, Synthroid  Pt notes she has been sick for about 3 weeks Started with URI type sx 3 weeks ago that have since cleared up She notes GI symptoms for about 3 weeks as well She notes that for the first 2 weeks she was not able to eat, this resolved but now she is having diarrhea after eating  She denies nausea, states she has "ick in my gut" No vomiting during this entire illness  She was taking ibuprofen for a time due to hip pain, but has stopped doing this now.  She states she was taking ibuprofen fairly regularly, wonders if she may have caused some gastritis  She also notes periods of "weakness and sweats" for 3 weeks which coincide with diarrhea She is not using anything for diarrhea such as imodium No blood on her stool, no melena No fever No urinary sx No particular abd pain, she does note epigastric burning which she thinks is due to ibuprofen use  Patient Active Problem  List   Diagnosis Date Noted  . Educated about COVID-19 virus infection 10/28/2019  . Vitamin D deficiency 06/23/2019  . Essential hypertension 09/28/2018  . Herpes zoster without complication 24/82/5003  . Eczema 09/21/2018  . Preventative health care 06/09/2016  . 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 Kidney stone 11/05/2014  . Alopecia 11/07/2013  . 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 Urinary problem 05/30/2013  . Tinea pedis 03/07/2013  . Arthritis 02/14/2013  . Renal insufficiency 12/26/2012  . Diarrhea 12/26/2012  . Other malaise and fatigue 09/19/2012  . Hyperglycemia 09/19/2012  . Esophageal reflux 09/19/2012  . Cervical cancer screening 01/05/2012  . Hyperlipidemia 01/05/2012  . Hypothyroidism 07/27/2011  . Bipolar disorder ([place]) 07/27/2011    Past Medical History:  Diagnosis Date  . Anxiety   . Arthritis 02/14/2013  . Bipolar disorder ([place])   . Esophageal reflux 09/19/2012   only slight with gallbladder problems  . Essential hypertension 09/28/2018  . Hypoglycemia   . Hypothyroidism   . Other malaise and fatigue 09/19/2012  . PONV (postoperative nausea and vomiting)   . Preventative health care 06/09/2016  . PTSD (post-traumatic stress disorder)   . Thyroid disease     Past Surgical History:  Procedure Laterality Date  . CHOLECYSTECTOMY N/A 09/01/2016   Procedure: LAPAROSCOPIC CHOLECYSTECTOMY WITH INTRAOPERATIVE CHOLANGIOGRAM;  Surgeon: Jovita Kussmaul, MD;  Location: [facility];  Service: General;  Laterality: N/A;  . 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 fooet surgery Left 2014   reconstruction of foot and ankle to correct deformity  . NECK SURGERY    . OTHER SURGICAL HISTORY    . OTHER SURGICAL HISTORY    . OTHER SURGICAL HISTORY    . TUBAL LIGATION     reversal    Social History   Tobacco Use  . Smoking status: Former Smoker    Packs/day: 3.00    Years: 24.00    Pack years: 72.00    Types: Cigarettes    Quit date:  1995    Years since quitting: 27.1  . Smokeless tobacco: Never Used  Vaping Use  . Vaping Use: Never used  Substance Use Topics  . Alcohol use: No    Alcohol/week: 0.0 standard drinks  . Drug use: No    Family History  Problem Relation Age of Onset  . Diabetes Mother        Brother 1 of 2  . Heart failure Mother   . Prostate cancer Father   . Parkinsonism Father        deceased  . Hypertension Brother   . Esophageal cancer Maternal Grandmother   . Cancer Paternal Grandmother        cancer of jawbone 1 of 2  . Melanoma Brother   . Breast cancer Neg Hx   . Colon cancer Neg Hx     Allergies  Allergen Reactions  . Novocain [Procaine] Other (See Comments)    Heart race  . Codeine Nausea Only  . Darvon Other (See Comments)    Hallucinations   . Sulfa Drugs Cross Reactors Nausea And Vomiting    Medication list has been reviewed and updated.  Current Outpatient Medications on File Prior to Visit  Medication Sig Dispense Refill  . amitriptyline (ELAVIL) 50 MG tablet Take 1 tablet (50 mg total) by mouth at bedtime. 30 tablet 2  . carbamazepine (CARBATROL) 200 MG 12 hr capsule TAKE 1 CAPSULE (200 MG TOTAL) BY MOUTH 2 (TWO) TIMES DAILY. 60 capsule 2  . risperiDONE (RISPERDAL) 2 MG tablet Take 1 tablet (2 mg total) by mouth at bedtime. 30 tablet 2  . rosuvastatin (CRESTOR) 5 MG tablet Take 5 mg by mouth daily.    Marland Kitchen SYNTHROID 125 MCG tablet TAKE 1 TABLET (125 MCG TOTAL) BY MOUTH DAILY. 60 tablet 1  . Vitamin D, Ergocalciferol, (DRISDOL) 1.25 MG (50000 UNIT) CAPS capsule Take 1 capsule (50,000 Units total) by mouth every 7 (seven) days. 4 capsule 4   No current facility-administered medications on file prior to visit.    Review of Systems:  As per HPI- otherwise negative.   Physical Examination: Vitals:   There were no vitals filed for this visit. There is no height or weight on file to calculate BMI. Ideal Body Weight:    Pt observed via video today  She does not  appear in any distress  She is not checking any vital signs at home, reports that she had a physical for a new job about 3 weeks ago and her vitals "looked great" at that time  Assessment and Plan: Gastritis without bleeding, unspecified chronicity, unspecified gastritis type - Plan: omeprazole (PRILOSEC) 40 MG capsule  Diarrhea, unspecified type  Virtual visit today for concern of 3 weeks of diarrhea and generalized GI upset.  No vomiting or fever.  Explained to patient that evaluation is limited by virtual format.  Her symptoms have been  stable for 3 weeks and not worsening.  She is in no distress.  Prescribed omeprazole to use for gastritis, advised patient to pick up over-the-counter Imodium to use as needed for diarrhea.  Advised bland diet with plenty of fluids.  She is asked to be seen in person next week if her symptoms are not improving.  She is advised to go to the ER if things get worse.  She states understanding and agreement  Video used for entirety of visit today   Signed Lamar Blinks, MD

## 2020-04-10 ENCOUNTER — Other Ambulatory Visit: Payer: Self-pay

## 2020-04-10 ENCOUNTER — Ambulatory Visit (INDEPENDENT_AMBULATORY_CARE_PROVIDER_SITE_OTHER): Payer: Medicare HMO | Admitting: Medical

## 2020-04-10 VITALS — BP 133/88 | HR 100 | Temp 98.2°F | Resp 18 | Ht 67.0 in | Wt 201.2 lb

## 2020-04-10 DIAGNOSIS — E559 Vitamin D deficiency, unspecified: Secondary | ICD-10-CM | POA: Diagnosis not present

## 2020-04-10 DIAGNOSIS — R5383 Other fatigue: Secondary | ICD-10-CM

## 2020-04-10 DIAGNOSIS — R1013 Epigastric pain: Secondary | ICD-10-CM

## 2020-04-10 DIAGNOSIS — R195 Other fecal abnormalities: Secondary | ICD-10-CM

## 2020-04-10 NOTE — Patient Instructions (Signed)
Recent nausea, decreased appetite and epigastric discomfort.  Those signs and symptoms have decreased but he still report some fatigue.  This might be related to recent non-Covid viral illness as well as decreased nutrition intake  time of illness.  I will get CBC, CMP, B12, B1, TSH, T4 and a vitamin D level.  Also including H. pylori antibody studies.   Recent 2 weeks of loose stools but those symptoms have resolved.  Now 2 days without any loose stools.  Recommend that you avoid any NSAIDs as you noted decreased abdomen pain as you stopped ibuprofen.  Recommend bland healthy foods presently.  If you have recurrence of loose stools then go through process to do stool culture, ova and parasite and C. Difficile.  Follow-up in 2 to 3 weeks or as needed

## 2020-04-10 NOTE — Progress Notes (Signed)
Subjective:    Patient ID: Margaret [name], female    DOB: [birth year], 70 y.o.   MRN: [id number]  HPI  Pt in for check up.  Last month had 2 weeks of constant nausea and decreased appetite. Then as nausea and appetite got better she states had loose stools every time she ate. She reports loose stools up until 2 days ago when that resolved.   Now she is reporting weakness/fatigue as she states still not eating much but able to eat some.   Pt saw Dr. Edilia Bo on video visit.(on review of that note and dx possible gastritisi) Pt states did not take the omeprazole Dr copland advised. . Burning in stomach resolved. Pt states pepto bismal and resolved symptoms. Also helped with loose stools.    Review of Systems  Constitutional: Negative for chills, fatigue and fever.  Respiratory: Negative for chest tightness, shortness of breath and wheezing.   Cardiovascular: Negative for chest pain and palpitations.  Gastrointestinal: Positive for abdominal pain. Negative for abdominal distention, anal bleeding, constipation, nausea and rectal pain.       Loose stools over past 2 weeks. Recently resolved for 2 days.  See hpi but stomach now feels better. Stats better after video visit.  Genitourinary: Negative for dysuria, flank pain, frequency, pelvic pain and vaginal pain.  Musculoskeletal: Negative for back pain.  Skin: Negative for rash.  Neurological: Negative for dizziness, speech difficulty, weakness, light-headedness and headaches.  Hematological: Negative for adenopathy. Does not bruise/bleed easily.  Psychiatric/Behavioral: Negative for behavioral problems and confusion.    Past Medical History:  Diagnosis Date  . Anxiety   . Arthritis 02/14/2013  . Bipolar disorder (Startex)   . Esophageal reflux 09/19/2012   only slight with gallbladder problems  . Essential hypertension 09/28/2018  . Hypoglycemia   . Hypothyroidism   . Other malaise and fatigue 09/19/2012  . PONV (postoperative nausea and  vomiting)   . Preventative health care 06/09/2016  . PTSD (post-traumatic stress disorder)   . Thyroid disease      Social History   Socioeconomic History  . Marital status: Divorced    Spouse name: Not on file  . Number of children: Not on file  . Years of education: Not on file  . Highest education level: Not on file  Occupational History  . Not on file  Tobacco Use  . Smoking status: Former Smoker    Packs/day: 3.00    Years: 24.00    Pack years: 72.00    Types: Cigarettes    Quit date: 1995    Years since quitting: 27.1  . Smokeless tobacco: Never Used  Vaping Use  . Vaping Use: Never used  Substance and Sexual Activity  . Alcohol use: No    Alcohol/week: 0.0 standard drinks  . Drug use: No  . Sexual activity: Not on file  Other Topics Concern  . Not on file  Social History Narrative  . Not on file   Social Determinants of Health   Financial Resource Strain: Not on file  Food Insecurity: Not on file  Transportation Needs: Not on file  Physical Activity: Not on file  Stress: Not on file  Social Connections: Not on file  Intimate Partner Violence: Not on file    Past Surgical History:  Procedure Laterality Date  . CHOLECYSTECTOMY N/A 09/01/2016   Procedure: LAPAROSCOPIC CHOLECYSTECTOMY WITH INTRAOPERATIVE CHOLANGIOGRAM;  Surgeon: Jovita Kussmaul, MD;  Location: [facility];  Service: General;  Laterality: N/A;  . DG  4TH DIGIT LEFT FOOT    . fooet surgery Left 2014   reconstruction of foot and ankle to correct deformity  . NECK SURGERY    . OTHER SURGICAL HISTORY    . OTHER SURGICAL HISTORY    . OTHER SURGICAL HISTORY    . TUBAL LIGATION     reversal    Family History  Problem Relation Age of Onset  . Diabetes Mother        Brother 1 of 2  . Heart failure Mother   . Prostate cancer Father   . Parkinsonism Father        deceased  . Hypertension Brother   . Esophageal cancer Maternal Grandmother   . Cancer Paternal Grandmother        cancer of jawbone 1  of 2  . Melanoma Brother   . Breast cancer Neg Hx   . Colon cancer Neg Hx     Allergies  Allergen Reactions  . Novocain [Procaine] Other (See Comments)    Heart race  . Codeine Nausea Only  . Darvon Other (See Comments)    Hallucinations   . Sulfa Drugs Cross Reactors Nausea And Vomiting    Current Outpatient Medications on File Prior to Visit  Medication Sig Dispense Refill  . amitriptyline (ELAVIL) 50 MG tablet Take 1 tablet (50 mg total) by mouth at bedtime. 30 tablet 2  . carbamazepine (CARBATROL) 200 MG 12 hr capsule TAKE 1 CAPSULE (200 MG TOTAL) BY MOUTH 2 (TWO) TIMES DAILY. 60 capsule 2  . omeprazole (PRILOSEC) 40 MG capsule Take 1 capsule (40 mg total) by mouth daily. 30 capsule 3  . risperiDONE (RISPERDAL) 2 MG tablet Take 1 tablet (2 mg total) by mouth at bedtime. 30 tablet 2  . rosuvastatin (CRESTOR) 5 MG tablet Take 5 mg by mouth daily.    Marland Kitchen SYNTHROID 125 MCG tablet TAKE 1 TABLET (125 MCG TOTAL) BY MOUTH DAILY. 60 tablet 1  . Vitamin D, Ergocalciferol, (DRISDOL) 1.25 MG (50000 UNIT) CAPS capsule Take 1 capsule (50,000 Units total) by mouth every 7 (seven) days. 4 capsule 4   No current facility-administered medications on file prior to visit.    BP 133/88   Pulse 100   Temp 98.2 F (36.8 C)   Resp 18   Ht 5\' 7"  (1.702 m)   Wt 201 lb 3.2 oz (91.3 kg)   SpO2 96%   BMI 31.51 kg/m      Objective:   Physical Exam   General Mental Status- Alert. General Appearance- Not in acute distress.   Skin General: Color- Normal Color. Moisture- Normal Moisture.  Neck Carotid Arteries- Normal color. Moisture- Normal Moisture. No carotid bruits. No JVD.  Chest and Lung Exam Auscultation: Breath Sounds:-Normal.  Cardiovascular Auscultation:Rythm- Regular. Murmurs & Other Heart Sounds:Auscultation of the heart reveals- No Murmurs.  Abdomen Inspection:-Inspeection Normal. Palpation/Percussion:Note:No mass. Palpation and Percussion of the abdomen reveal- Non  Tender, Non Distended + BS, no rebound or guarding.   Neurologic Cranial Nerve exam:- CN III-XII intact(No nystagmus), symmetric smile. Strength:- 5/5 equal and symmetric strength both upper and lower extremities.     Assessment & Plan:  Recent nausea, decreased appetite and epigastric discomfort.  Those signs and symptoms have decreased but he still report some fatigue.  This might be related to recent non-Covid viral illness as well as decreased nutrition intake  time of illness.  I will get CBC, CMP, B12, B1, TSH, T4 and a vitamin D level.  Also including H.  pylori antibody studies.   Recent 2 weeks of loose stools but those symptoms have resolved.  Now 2 days without any loose stools.  Recommend that you avoid any NSAIDs as you noted decreased abdomen pain as you stopped ibuprofen.  Recommend bland healthy foods presently.  If you have recurrence of loose stools then go through process to do stool culture, ova and parasite and C. Difficile.  Follow-up in 2 to 3 weeks or as needed  [employer], [employer]

## 2020-04-10 NOTE — Addendum Note (Signed)
Addended by: Kelle Darting A on: 04/10/2020 03:21 PM   Modules accepted: Orders

## 2020-04-11 LAB — COMPREHENSIVE METABOLIC PANEL
ALT: 16 U/L (ref 0–35)
AST: 12 U/L (ref 0–37)
Albumin: 4 g/dL (ref 3.5–5.2)
Alkaline Phosphatase: 104 U/L (ref 39–117)
BUN: 29 mg/dL — ABNORMAL HIGH (ref 6–23)
CO2: 25 mEq/L (ref 19–32)
Calcium: 9.5 mg/dL (ref 8.4–10.5)
Chloride: 105 mEq/L (ref 96–112)
Creatinine, Ser: 1.06 mg/dL (ref 0.40–1.20)
GFR: 53.58 mL/min — ABNORMAL LOW (ref >60.00)
Glucose, Bld: 142 mg/dL — ABNORMAL HIGH (ref 70–99)
Potassium: 4 mEq/L (ref 3.5–5.1)
Sodium: 137 mEq/L (ref 135–145)
Total Bilirubin: 0.3 mg/dL (ref 0.2–1.2)
Total Protein: 6.5 g/dL (ref 6.0–8.3)

## 2020-04-11 LAB — T4, FREE: Free T4: 0.93 ng/dL (ref 0.60–1.60)

## 2020-04-11 LAB — CBC WITH DIFFERENTIAL/PLATELET
Basophils Absolute: 0 10*3/uL (ref 0.0–0.1)
Basophils Relative: 0.7 % (ref 0.0–3.0)
Eosinophils Absolute: 0.3 10*3/uL (ref 0.0–0.7)
Eosinophils Relative: 3.8 % (ref 0.0–5.0)
HCT: 40.5 % (ref 36.0–46.0)
Hemoglobin: 13.9 g/dL (ref 12.0–15.0)
Lymphocytes Relative: 17.9 % (ref 12.0–46.0)
Lymphs Abs: 1.2 10*3/uL (ref 0.7–4.0)
MCHC: 34.4 g/dL (ref 30.0–36.0)
MCV: 95.4 fl (ref 78.0–100.0)
Monocytes Absolute: 0.8 10*3/uL (ref 0.1–1.0)
Monocytes Relative: 11.8 % (ref 3.0–12.0)
Neutro Abs: 4.3 10*3/uL (ref 1.4–7.7)
Neutrophils Relative %: 65.8 % (ref 43.0–77.0)
Platelets: 337 10*3/uL (ref 150.0–400.0)
RBC: 4.24 Mil/uL (ref 3.87–5.11)
RDW: 12.9 % (ref 11.5–15.5)
WBC: 6.6 10*3/uL (ref 4.0–10.5)

## 2020-04-11 LAB — TSH: TSH: 2.98 u[IU]/mL (ref 0.35–4.50)

## 2020-04-11 LAB — VITAMIN D 25 HYDROXY (VIT D DEFICIENCY, FRACTURES): VITD: 32.72 ng/mL (ref 30.00–100.00)

## 2020-04-11 LAB — VITAMIN B12: Vitamin B-12: 242 pg/mL (ref 211–911)

## 2020-04-11 LAB — H PYLORI, IGM, IGG, IGA AB
H pylori, IgM Abs: <9 units (ref 0.0–8.9)
H. pylori, IgA Abs: <9 units (ref 0.0–8.9)
H. pylori, IgG AbS: 0.19 Index Value (ref 0.00–0.79)

## 2020-04-19 ENCOUNTER — Other Ambulatory Visit: Payer: Self-pay | Admitting: Family Medicine

## 2020-04-19 ENCOUNTER — Other Ambulatory Visit (HOSPITAL_COMMUNITY): Payer: Self-pay | Admitting: Psychiatry

## 2020-04-19 DIAGNOSIS — F319 Bipolar disorder, unspecified: Secondary | ICD-10-CM

## 2020-04-19 DIAGNOSIS — F419 Anxiety disorder, unspecified: Secondary | ICD-10-CM

## 2020-04-19 MED FILL — SYNTHROID 125 MCG TABLET: 125 | 90 days supply | Qty: 90 | Fill #0

## 2020-04-19 MED FILL — risperiDONE 2 MG TABS: 2 | 30 days supply | Qty: 30 | Fill #0

## 2020-04-19 MED FILL — AMITRIPTYLINE HCL 50 MG TAB: 50 | 30 days supply | Qty: 30 | Fill #0

## 2020-04-19 MED FILL — CARBATROL 200 MG CAPSULE SA: 200 | 30 days supply | Qty: 60 | Fill #1

## 2020-05-07 ENCOUNTER — Encounter: Payer: No Typology Code available for payment source | Admitting: Family Medicine

## 2020-05-07 DIAGNOSIS — M1612 Unilateral primary osteoarthritis, left hip: Secondary | ICD-10-CM | POA: Diagnosis not present

## 2020-05-07 DIAGNOSIS — M25552 Pain in left hip: Secondary | ICD-10-CM | POA: Diagnosis not present

## 2020-05-08 DIAGNOSIS — M1612 Unilateral primary osteoarthritis, left hip: Secondary | ICD-10-CM | POA: Diagnosis not present

## 2020-05-12 ENCOUNTER — Other Ambulatory Visit (HOSPITAL_BASED_OUTPATIENT_CLINIC_OR_DEPARTMENT_OTHER): Payer: Self-pay

## 2020-05-15 ENCOUNTER — Other Ambulatory Visit (HOSPITAL_BASED_OUTPATIENT_CLINIC_OR_DEPARTMENT_OTHER): Payer: Self-pay

## 2020-05-15 ENCOUNTER — Encounter (HOSPITAL_COMMUNITY): Payer: Self-pay | Admitting: Psychiatry

## 2020-05-15 ENCOUNTER — Telehealth (INDEPENDENT_AMBULATORY_CARE_PROVIDER_SITE_OTHER): Payer: Medicare HMO | Admitting: Psychiatry

## 2020-05-15 ENCOUNTER — Other Ambulatory Visit: Payer: Self-pay

## 2020-05-15 DIAGNOSIS — F419 Anxiety disorder, unspecified: Secondary | ICD-10-CM | POA: Diagnosis not present

## 2020-05-15 DIAGNOSIS — F319 Bipolar disorder, unspecified: Secondary | ICD-10-CM

## 2020-05-15 MED ORDER — AMITRIPTYLINE HCL 50 MG PO TABS
ORAL_TABLET | Freq: Every day | ORAL | 2 refills | Status: DC
  Filled 2020-05-15: qty 30, 30d supply, fill #0
  Filled 2020-06-21: qty 30, 30d supply, fill #1
  Filled 2020-07-23: qty 30, 30d supply, fill #2

## 2020-05-15 MED ORDER — CARBAMAZEPINE ER 200 MG PO CP12
ORAL_CAPSULE | Freq: Two times a day (BID) | ORAL | 2 refills | Status: DC
  Filled 2020-05-15: qty 60, 30d supply, fill #0
  Filled 2020-06-21 – 2020-06-22 (×2): qty 60, 30d supply, fill #1
  Filled 2020-07-03 – 2020-07-23 (×2): qty 60, 30d supply, fill #2

## 2020-05-15 MED ORDER — RISPERIDONE 2 MG PO TABS
ORAL_TABLET | Freq: Every day | ORAL | 2 refills | Status: DC
  Filled 2020-05-15: qty 30, 30d supply, fill #0
  Filled 2020-06-21: qty 30, 30d supply, fill #1
  Filled 2020-07-23: qty 30, 30d supply, fill #2

## 2020-05-15 NOTE — Progress Notes (Signed)
Virtual Visit via Telephone Note  I connected with Margaret [name] on 05/15/20 at  9:00 AM EDT by telephone and verified that I am speaking with the correct person using two identifiers.  Location: Patient: Home Provider: Home Office   I discussed the limitations, risks, security and privacy concerns of performing an evaluation and management service by telephone and the availability of in person appointments. I also discussed with the patient that there may be a patient responsible charge related to this service. The patient expressed understanding and agreed to proceed.   History of Present Illness: Patient is evaluated by phone session.  She is taking all her medication and reported her mood and anxiety is a stable.  She is frustrated with insurance because she has to pay out of her pocket for her carbamazepine.  She does not want to change the medication since it is keeping her stable.  She also complaining of osteoarthritis hip pain and back pain.  Recently she received injection but her pain came back after few days.  She is recommended for stronger pain medicine but patient is afraid to take it.  She is still looking for a part-time job but she is not sure because of the pain she can do the part-time job.  She is sleeping good.  She denies any paranoia, hallucination, crying spells or any suicidal thoughts.  She lives with her dog and she is happy.  She denies any mania or any impulsive behavior.  Recently she had blood work and her BUN is slightly increased.  She was taking ibuprofen for pain but now she had stopped and hoping it will help her BUN .    Past Psychiatric History: H/O anxiety, bipolar d/o andinpatient in 2009. No h/osuicidal attempt. Lithium worked well but discontinued due to high creatinine.  Recent Results (from the past 2160 hour(s))  CBC w/Diff     Status: None   Collection Time: 04/10/20  3:07 PM  Result Value Ref Range   WBC 6.6 4.0 - 10.5 K/uL   RBC 4.24 3.87 -  5.11 Mil/uL   Hemoglobin 13.9 12.0 - 15.0 g/dL   HCT 40.5 36.0 - 46.0 %   MCV 95.4 78.0 - 100.0 fl   MCHC 34.4 30.0 - 36.0 g/dL   RDW 12.9 11.5 - 15.5 %   Platelets 337.0 150.0 - 400.0 K/uL   Neutrophils Relative % 65.8 43.0 - 77.0 %   Lymphocytes Relative 17.9 12.0 - 46.0 %   Monocytes Relative 11.8 3.0 - 12.0 %   Eosinophils Relative 3.8 0.0 - 5.0 %   Basophils Relative 0.7 0.0 - 3.0 %   Neutro Abs 4.3 1.4 - 7.7 K/uL   Lymphs Abs 1.2 0.7 - 4.0 K/uL   Monocytes Absolute 0.8 0.1 - 1.0 K/uL   Eosinophils Absolute 0.3 0.0 - 0.7 K/uL   Basophils Absolute 0.0 0.0 - 0.1 K/uL  Comp Met (CMET)     Status: Abnormal   Collection Time: 04/10/20  3:07 PM  Result Value Ref Range   Sodium 137 135 - 145 mEq/L   Potassium 4.0 3.5 - 5.1 mEq/L   Chloride 105 96 - 112 mEq/L   CO2 25 19 - 32 mEq/L   Glucose, Bld 142 (H) 70 - 99 mg/dL   BUN 29 (H) 6 - 23 mg/dL   Creatinine, Ser 1.06 0.40 - 1.20 mg/dL   Total Bilirubin 0.3 0.2 - 1.2 mg/dL   Alkaline Phosphatase 104 39 - 117 U/L  AST 12 0 - 37 U/L   ALT 16 0 - 35 U/L   Total Protein 6.5 6.0 - 8.3 g/dL   Albumin 4.0 3.5 - 5.2 g/dL   GFR 53.58 (L) >60.00 mL/min    Comment: Calculated using the CKD-EPI Creatinine Equation (2021)   Calcium 9.5 8.4 - 10.5 mg/dL  TSH     Status: None   Collection Time: 04/10/20  3:07 PM  Result Value Ref Range   TSH 2.98 0.35 - 4.50 uIU/mL  T4, free     Status: None   Collection Time: 04/10/20  3:07 PM  Result Value Ref Range   Free T4 0.93 0.60 - 1.60 ng/dL    Comment: Specimens from patients who are undergoing biotin therapy and /or ingesting biotin supplements may contain high levels of biotin.  The higher biotin concentration in these specimens interferes with this Free T4 assay.  Specimens that contain high levels  of biotin may cause false high results for this Free T4 assay.  Please interpret results in light of the total clinical presentation of the patient.    B12     Status: None   Collection Time:  04/10/20  3:07 PM  Result Value Ref Range   Vitamin B-12 242 211 - 911 pg/mL  H Pylori, IGM, IGG, IGA AB     Status: None   Collection Time: 04/10/20  3:07 PM  Result Value Ref Range   H. pylori, IgG AbS 0.19 0.00 - 0.79 Index Value    Comment:                              Negative           <0.80                              Equivocal    0.80 - 0.89                              Positive           >0.89    H. pylori, IgA Abs <9.0 0.0 - 8.9 units    Comment:                                 Negative          <9.0                                 Equivocal   9.0 - 11.0                                 Positive         >11.0    H pylori, IgM Abs <9.0 0.0 - 8.9 units    Comment:                                 Negative          <9.0  Equivocal   9.0 - 11.0                                 Positive         >11.0 This test was developed and its performance characteristics determined by [facility]. It has not been cleared or approved by the Food and Drug Administration.   VITAMIN D 25 Hydroxy (Vit-D Deficiency, Fractures)     Status: None   Collection Time: 04/10/20  3:07 PM  Result Value Ref Range   VITD 32.72 30.00 - 100.00 ng/mL    Psychiatric Specialty Exam: Physical Exam  Review of Systems  Weight 204 lb (92.5 kg).There is no height or weight on file to calculate BMI.  General Appearance: NA  Eye Contact:  NA  Speech:  Clear and Coherent  Volume:  Normal  Mood:  Anxious  Affect:  NA  Thought Process:  Descriptions of Associations: Intact  Orientation:  Full (Time, Place, and Person)  Thought Content:  WDL  Suicidal Thoughts:  No  Homicidal Thoughts:  No  Memory:  Immediate;   Good Recent;   Good Remote;   Good  Judgement:  Intact  Insight:  Present  Psychomotor Activity:  NA  Concentration:  Concentration: Fair and Attention Span: Fair  Recall:  [place] of Knowledge:  Good  Language:  Good  Akathisia:  No  Handed:  Right  AIMS (if  indicated):     Assets:  Communication Skills Desire for Improvement Housing Resilience Transportation  ADL's:  Intact  Cognition:  WNL  Sleep:   fair      Assessment and Plan: Bipolar disorder type I.  Anxiety.  I reviewed blood work results.  Encouraged hydration to help her kidney function.  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  Patient does not want to change the medication even though she has been pain carbamazepine out of pocket.  She feels her mood is stable.  I will continue Risperdal 2 mg at bedtime, Tegretol 200 mg twice a day, amitriptyline 50 mg at bedtime.  Her last Tegretol level was done in September 2021 which was 6.7.  Discussed medication side effects and benefits.  Recommended to call us back if is any question or any concern.  Follow-up in 3 months.  Follow Up Instructions:    I discussed the assessment and treatment plan with the patient. The patient was provided an opportunity to ask questions and all were answered. The patient agreed with the plan and demonstrated an understanding of the instructions.   The patient was advised to call back or seek an in-person evaluation if the symptoms worsen or if the condition fails to improve as anticipated.  I provided 18 minutes of non-face-to-face time during this encounter.   [name], MD

## 2020-06-22 ENCOUNTER — Other Ambulatory Visit (HOSPITAL_BASED_OUTPATIENT_CLINIC_OR_DEPARTMENT_OTHER): Payer: Self-pay

## 2020-06-29 ENCOUNTER — Telehealth (HOSPITAL_COMMUNITY): Payer: Self-pay | Admitting: *Deleted

## 2020-06-29 NOTE — Telephone Encounter (Signed)
PA for Carbatrol ER 200 mg bid submitted to [facility]. Awaiting determination.

## 2020-07-03 ENCOUNTER — Telehealth (HOSPITAL_COMMUNITY): Payer: Self-pay

## 2020-07-03 ENCOUNTER — Other Ambulatory Visit (HOSPITAL_BASED_OUTPATIENT_CLINIC_OR_DEPARTMENT_OTHER): Payer: Self-pay

## 2020-07-03 DIAGNOSIS — L602 Onychogryphosis: Secondary | ICD-10-CM | POA: Diagnosis not present

## 2020-07-03 DIAGNOSIS — M79672 Pain in left foot: Secondary | ICD-10-CM | POA: Diagnosis not present

## 2020-07-03 DIAGNOSIS — M79671 Pain in right foot: Secondary | ICD-10-CM | POA: Diagnosis not present

## 2020-07-03 NOTE — Telephone Encounter (Signed)
[employer] PRESCRIPTION COVERAGE APPROVED  CARBAMAZEPINE (CARBATROL) 200MG  12 HR CAPSULE EFFECTIVE 06/29/2020 TO 02/09/2021  NOTIFIED PHARMACY & PATIENT

## 2020-07-19 MED FILL — Levothyroxine Sodium Tab 125 MCG: ORAL | 90 days supply | Qty: 90 | Fill #0 | Status: AC

## 2020-07-20 ENCOUNTER — Other Ambulatory Visit (HOSPITAL_BASED_OUTPATIENT_CLINIC_OR_DEPARTMENT_OTHER): Payer: Self-pay

## 2020-07-24 ENCOUNTER — Other Ambulatory Visit (HOSPITAL_BASED_OUTPATIENT_CLINIC_OR_DEPARTMENT_OTHER): Payer: Self-pay

## 2020-08-14 ENCOUNTER — Telehealth (HOSPITAL_COMMUNITY): Payer: Medicare HMO | Admitting: Psychiatry

## 2020-08-14 ENCOUNTER — Other Ambulatory Visit: Payer: Self-pay

## 2020-08-21 ENCOUNTER — Other Ambulatory Visit (HOSPITAL_BASED_OUTPATIENT_CLINIC_OR_DEPARTMENT_OTHER): Payer: Self-pay

## 2020-08-21 ENCOUNTER — Other Ambulatory Visit (HOSPITAL_COMMUNITY): Payer: Self-pay | Admitting: Psychiatry

## 2020-08-21 DIAGNOSIS — F319 Bipolar disorder, unspecified: Secondary | ICD-10-CM

## 2020-08-21 DIAGNOSIS — F419 Anxiety disorder, unspecified: Secondary | ICD-10-CM

## 2020-08-22 ENCOUNTER — Other Ambulatory Visit (HOSPITAL_BASED_OUTPATIENT_CLINIC_OR_DEPARTMENT_OTHER): Payer: Self-pay

## 2020-08-23 ENCOUNTER — Other Ambulatory Visit (HOSPITAL_COMMUNITY): Payer: Self-pay | Admitting: *Deleted

## 2020-08-23 ENCOUNTER — Other Ambulatory Visit (HOSPITAL_BASED_OUTPATIENT_CLINIC_OR_DEPARTMENT_OTHER): Payer: Self-pay

## 2020-08-23 DIAGNOSIS — F419 Anxiety disorder, unspecified: Secondary | ICD-10-CM

## 2020-08-23 DIAGNOSIS — F319 Bipolar disorder, unspecified: Secondary | ICD-10-CM

## 2020-08-23 MED ORDER — CARBAMAZEPINE ER 200 MG PO CP12
ORAL_CAPSULE | Freq: Two times a day (BID) | ORAL | 0 refills | Status: DC
  Filled 2020-08-23: qty 60, 30d supply, fill #0

## 2020-08-23 MED ORDER — RISPERIDONE 2 MG PO TABS
ORAL_TABLET | Freq: Every day | ORAL | 0 refills | Status: DC
  Filled 2020-08-23: qty 30, 30d supply, fill #0

## 2020-08-23 MED ORDER — AMITRIPTYLINE HCL 50 MG PO TABS
ORAL_TABLET | Freq: Every day | ORAL | 0 refills | Status: DC
Start: 2020-08-23 — End: 2020-09-21
  Filled 2020-08-23: qty 30, 30d supply, fill #0

## 2020-08-24 ENCOUNTER — Other Ambulatory Visit (HOSPITAL_BASED_OUTPATIENT_CLINIC_OR_DEPARTMENT_OTHER): Payer: Self-pay

## 2020-09-20 ENCOUNTER — Other Ambulatory Visit (HOSPITAL_COMMUNITY): Payer: Self-pay | Admitting: Psychiatry

## 2020-09-20 DIAGNOSIS — F419 Anxiety disorder, unspecified: Secondary | ICD-10-CM

## 2020-09-20 DIAGNOSIS — F319 Bipolar disorder, unspecified: Secondary | ICD-10-CM

## 2020-09-21 ENCOUNTER — Other Ambulatory Visit (HOSPITAL_COMMUNITY): Payer: Self-pay | Admitting: *Deleted

## 2020-09-21 ENCOUNTER — Other Ambulatory Visit (HOSPITAL_BASED_OUTPATIENT_CLINIC_OR_DEPARTMENT_OTHER): Payer: Self-pay

## 2020-09-21 DIAGNOSIS — F419 Anxiety disorder, unspecified: Secondary | ICD-10-CM

## 2020-09-21 DIAGNOSIS — F319 Bipolar disorder, unspecified: Secondary | ICD-10-CM

## 2020-09-21 MED ORDER — CARBAMAZEPINE ER 200 MG PO CP12
ORAL_CAPSULE | Freq: Two times a day (BID) | ORAL | 0 refills | Status: DC
  Filled 2020-09-21: qty 62, 31d supply, fill #0

## 2020-09-21 MED ORDER — AMITRIPTYLINE HCL 50 MG PO TABS
ORAL_TABLET | Freq: Every day | ORAL | 0 refills | Status: DC
  Filled 2020-09-21: qty 32, 32d supply, fill #0

## 2020-09-21 MED ORDER — RISPERIDONE 2 MG PO TABS
ORAL_TABLET | Freq: Every day | ORAL | 0 refills | Status: DC
  Filled 2020-09-21: qty 32, 32d supply, fill #0

## 2020-10-12 ENCOUNTER — Other Ambulatory Visit (HOSPITAL_BASED_OUTPATIENT_CLINIC_OR_DEPARTMENT_OTHER): Payer: Self-pay

## 2020-10-12 ENCOUNTER — Other Ambulatory Visit: Payer: Self-pay | Admitting: Family Medicine

## 2020-10-12 MED ORDER — LEVOTHYROXINE SODIUM 125 MCG PO TABS
ORAL_TABLET | Freq: Every day | ORAL | 0 refills | Status: DC
  Filled 2020-10-12: qty 90, 90d supply, fill #0

## 2020-10-17 ENCOUNTER — Other Ambulatory Visit (HOSPITAL_BASED_OUTPATIENT_CLINIC_OR_DEPARTMENT_OTHER): Payer: Self-pay

## 2020-10-17 ENCOUNTER — Telehealth: Payer: Self-pay | Admitting: Family Medicine

## 2020-10-17 NOTE — Telephone Encounter (Signed)
Medication: Rx #: [id number]  levothyroxine (SYNTHROID) 125 MCG tablet  Has the patient contacted their pharmacy? Yes.   (If no, request that the patient contact the pharmacy for the refill.) (If yes, when and what did the pharmacy advise?)  Preferred Pharmacy (with phone number or street name): [facility]  [address] [place], [place] [address]  Phone:  [phone]  Fax:  [phone]  Agent: Please be advised that RX refills may take up to 3 business days. We ask that you follow-up with your pharmacy.

## 2020-10-18 ENCOUNTER — Other Ambulatory Visit (HOSPITAL_BASED_OUTPATIENT_CLINIC_OR_DEPARTMENT_OTHER): Payer: Self-pay

## 2020-10-22 ENCOUNTER — Other Ambulatory Visit (HOSPITAL_COMMUNITY): Payer: Self-pay | Admitting: Psychiatry

## 2020-10-22 DIAGNOSIS — F419 Anxiety disorder, unspecified: Secondary | ICD-10-CM

## 2020-10-22 DIAGNOSIS — F319 Bipolar disorder, unspecified: Secondary | ICD-10-CM

## 2020-10-23 ENCOUNTER — Other Ambulatory Visit (HOSPITAL_BASED_OUTPATIENT_CLINIC_OR_DEPARTMENT_OTHER): Payer: Self-pay

## 2020-10-24 ENCOUNTER — Other Ambulatory Visit: Payer: Self-pay

## 2020-10-24 ENCOUNTER — Other Ambulatory Visit (HOSPITAL_BASED_OUTPATIENT_CLINIC_OR_DEPARTMENT_OTHER): Payer: Self-pay

## 2020-10-24 ENCOUNTER — Encounter (HOSPITAL_COMMUNITY): Payer: Self-pay | Admitting: Psychiatry

## 2020-10-24 ENCOUNTER — Telehealth (INDEPENDENT_AMBULATORY_CARE_PROVIDER_SITE_OTHER): Payer: Medicare HMO | Admitting: Psychiatry

## 2020-10-24 DIAGNOSIS — F419 Anxiety disorder, unspecified: Secondary | ICD-10-CM | POA: Diagnosis not present

## 2020-10-24 DIAGNOSIS — F319 Bipolar disorder, unspecified: Secondary | ICD-10-CM

## 2020-10-24 MED ORDER — CARBAMAZEPINE ER 200 MG PO CP12
ORAL_CAPSULE | Freq: Two times a day (BID) | ORAL | 0 refills | Status: DC
  Filled 2020-10-24: qty 180, 90d supply, fill #0

## 2020-10-24 MED ORDER — RISPERIDONE 2 MG PO TABS
ORAL_TABLET | Freq: Every day | ORAL | 0 refills | Status: DC
  Filled 2020-10-24: qty 90, 90d supply, fill #0

## 2020-10-24 MED ORDER — AMITRIPTYLINE HCL 50 MG PO TABS
ORAL_TABLET | Freq: Every day | ORAL | 0 refills | Status: DC
Start: 2020-10-24 — End: 2021-01-16
  Filled 2020-10-24: qty 90, 90d supply, fill #0

## 2020-10-24 NOTE — Progress Notes (Signed)
Virtual Visit via Telephone Note  I connected with Margaret Margaret [name] on 10/24/20 at  8:20 AM EDT by telephone and verified that I am speaking with the correct person using two identifiers.  Location: Patient: home Provider: home office   I discussed the limitations, risks, security and privacy concerns of performing an evaluation and management service by telephone and the availability of in person appointments. I also discussed with the patient that there may be a patient responsible charge related to this service. The patient expressed understanding and agreed to proceed.   History of Present Illness: Patient is evaluated by phone session.  She is taking all of her medication but she ran out a few days as she missed appointment.  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  She admitted job is busy but she enjoys and does not make her very stressful because she does not have to walk as much.  She still have knee pain but her job is setting.  She gets tired at the end of the day and after walking the dog she go to sleep without problem.  She is taking the medicine and she feels her mood, depression, anxiety is under control.  She denies any mania, psychosis, hallucination.  She has no tremors or shakes.  Her appetite is okay and her weight is stable.  She like to keep her current medication.  Past Psychiatric History:  H/O anxiety, bipolar d/o and inpatient in 2009.  No h/o suicidal attempt.  Lithium worked well but discontinued due to high creatinine.  Psychiatric Specialty Exam: Physical Exam  Review of Systems  Weight 205 lb (93 kg).There is no height or weight on file to calculate BMI.  General Appearance: NA  Eye Contact:  NA  Speech:   fast but clear  Volume:  Normal  Mood:  Euthymic  Affect:  NA  Thought Process:  Descriptions of Associations: Intact  Orientation:  Full (Time, Place, and Person)   Thought Content:  WDL  Suicidal Thoughts:  No  Homicidal Thoughts:  No  Memory:  Immediate;   Good Recent;   Fair Remote;   Fair  Judgement:  Fair  Insight:  Present  Psychomotor Activity:  NA  Concentration:  Concentration: Fair and Attention Span: Fair  Recall:  Good  Fund of Knowledge:  Good  Language:  Good  Akathisia:  No  Handed:  Right  AIMS (if indicated):     Assets:  Communication Skills Desire for Improvement Housing Resilience Transportation  ADL's:  Intact  Cognition:  WNL  Sleep:   ok      Assessment and Plan: Bipolar disorder type I.  Anxiety.  Patient is stable on her current medication.  Reemphasize medication compliance and follow appointments.  Continue amitriptyline 50 mg at bedtime, Risperdal 2 mg at bedtime and Tegretol 200 mg twice a day.  Her last Depakote level was done in September 2021 which was 6.7.  We will do another blood work on her next appointment.  Recommended to call us Margaret [name] if she has any question or any concern.  Follow-up in 3 months.  Follow Up Instructions:    I discussed the assessment and treatment plan with the patient. The patient was provided an opportunity to ask questions and all were answered. The patient agreed with the plan and demonstrated an understanding of the instructions.   The patient was advised to call  Margaret [name] or seek an in-person evaluation if the symptoms worsen or if the condition fails to improve as anticipated.  I provided 14 minutes of non-face-to-face time during this encounter.   [name], MD

## 2020-10-25 ENCOUNTER — Other Ambulatory Visit (HOSPITAL_BASED_OUTPATIENT_CLINIC_OR_DEPARTMENT_OTHER): Payer: Self-pay

## 2020-10-29 DIAGNOSIS — L602 Onychogryphosis: Secondary | ICD-10-CM | POA: Diagnosis not present

## 2020-10-29 DIAGNOSIS — M79672 Pain in left foot: Secondary | ICD-10-CM | POA: Diagnosis not present

## 2020-10-29 DIAGNOSIS — M79671 Pain in right foot: Secondary | ICD-10-CM | POA: Diagnosis not present

## 2020-11-21 DIAGNOSIS — H2513 Age-related nuclear cataract, bilateral: Secondary | ICD-10-CM | POA: Diagnosis not present

## 2020-11-21 DIAGNOSIS — H40033 Anatomical narrow angle, bilateral: Secondary | ICD-10-CM | POA: Diagnosis not present

## 2020-11-21 DIAGNOSIS — H524 Presbyopia: Secondary | ICD-10-CM | POA: Diagnosis not present

## 2020-12-20 DIAGNOSIS — Z03818 Encounter for observation for suspected exposure to other biological agents ruled out: Secondary | ICD-10-CM | POA: Diagnosis not present

## 2021-01-09 ENCOUNTER — Telehealth: Payer: Self-pay | Admitting: Family Medicine

## 2021-01-09 NOTE — Telephone Encounter (Signed)
Patient would like TB testing orders for her job application. Please advice.

## 2021-01-10 ENCOUNTER — Ambulatory Visit: Payer: Medicare HMO

## 2021-01-10 ENCOUNTER — Other Ambulatory Visit: Payer: Self-pay | Admitting: Family Medicine

## 2021-01-10 ENCOUNTER — Telehealth: Payer: Self-pay

## 2021-01-10 ENCOUNTER — Other Ambulatory Visit: Payer: Medicare HMO

## 2021-01-10 ENCOUNTER — Other Ambulatory Visit: Payer: Self-pay | Admitting: *Deleted

## 2021-01-10 DIAGNOSIS — Z111 Encounter for screening for respiratory tuberculosis: Secondary | ICD-10-CM

## 2021-01-10 NOTE — Addendum Note (Signed)
Addended by: Kelle Darting A on: 01/10/2021 02:16 PM   Modules accepted: Orders

## 2021-01-10 NOTE — Telephone Encounter (Signed)
Called pt lvm to call back to schedule. Put in phone note

## 2021-01-10 NOTE — Telephone Encounter (Signed)
Called pt to schedule TB skin test lvm to call back

## 2021-01-11 ENCOUNTER — Ambulatory Visit (INDEPENDENT_AMBULATORY_CARE_PROVIDER_SITE_OTHER): Payer: Medicare HMO | Admitting: *Deleted

## 2021-01-11 ENCOUNTER — Other Ambulatory Visit (HOSPITAL_BASED_OUTPATIENT_CLINIC_OR_DEPARTMENT_OTHER): Payer: Self-pay

## 2021-01-11 DIAGNOSIS — Z111 Encounter for screening for respiratory tuberculosis: Secondary | ICD-10-CM | POA: Diagnosis not present

## 2021-01-11 MED ORDER — LEVOTHYROXINE SODIUM 125 MCG PO TABS
ORAL_TABLET | Freq: Every day | ORAL | 0 refills | Status: DC
  Filled 2021-01-11: qty 90, 90d supply, fill #0

## 2021-01-11 NOTE — Progress Notes (Signed)
Patient is here for tb skin test for work.  Tb test place on left forearm and patient tolerated well.

## 2021-01-14 ENCOUNTER — Ambulatory Visit (INDEPENDENT_AMBULATORY_CARE_PROVIDER_SITE_OTHER): Payer: Medicare HMO | Admitting: *Deleted

## 2021-01-14 DIAGNOSIS — Z111 Encounter for screening for respiratory tuberculosis: Secondary | ICD-10-CM

## 2021-01-14 LAB — TB SKIN TEST
Induration: 0 mm
TB Skin Test: NEGATIVE

## 2021-01-14 NOTE — Progress Notes (Addendum)
Patient here for tb read.  TB read was negative.

## 2021-01-16 ENCOUNTER — Telehealth (HOSPITAL_BASED_OUTPATIENT_CLINIC_OR_DEPARTMENT_OTHER): Payer: Medicare HMO | Admitting: Psychiatry

## 2021-01-16 ENCOUNTER — Other Ambulatory Visit (HOSPITAL_BASED_OUTPATIENT_CLINIC_OR_DEPARTMENT_OTHER): Payer: Self-pay

## 2021-01-16 ENCOUNTER — Encounter (HOSPITAL_COMMUNITY): Payer: Self-pay | Admitting: Psychiatry

## 2021-01-16 ENCOUNTER — Other Ambulatory Visit: Payer: Self-pay

## 2021-01-16 DIAGNOSIS — F319 Bipolar disorder, unspecified: Secondary | ICD-10-CM | POA: Diagnosis not present

## 2021-01-16 DIAGNOSIS — F419 Anxiety disorder, unspecified: Secondary | ICD-10-CM

## 2021-01-16 MED ORDER — RISPERIDONE 2 MG PO TABS
ORAL_TABLET | Freq: Every day | ORAL | 0 refills | Status: DC
  Filled 2021-01-16: qty 90, 90d supply, fill #0

## 2021-01-16 MED ORDER — CARBAMAZEPINE ER 200 MG PO CP12
ORAL_CAPSULE | Freq: Two times a day (BID) | ORAL | 0 refills | Status: DC
  Filled 2021-01-16: qty 180, 90d supply, fill #0

## 2021-01-16 MED ORDER — AMITRIPTYLINE HCL 50 MG PO TABS
ORAL_TABLET | Freq: Every day | ORAL | 0 refills | Status: DC
Start: 2021-01-16 — End: 2021-04-22
  Filled 2021-01-16: qty 90, 90d supply, fill #0

## 2021-01-16 NOTE — Progress Notes (Signed)
Virtual Visit via Telephone Note  I connected with Margaret [name] on 01/16/21 at  1:20 PM EST by telephone and verified that I am speaking with the correct person using two identifiers.  Location: Patient: In Car Provider: Home Office   I discussed the limitations, risks, security and privacy concerns of performing an evaluation and management service by telephone and the availability of in person appointments. I also discussed with the patient that there may be a patient responsible charge related to this service. The patient expressed understanding and agreed to proceed.   History of Present Illness: Patient is evaluated by phone session.  She had quit her previous job and now working part-time as a Programmer, applications and she liked this job because not much physical work.  She is also working first shift and her previous job she was working second shift which she did not like.  She reported her mood is stable and denies any highs and lows, mania, psychosis.  She sleeps good.  She endorsed chronic pain in her knee joint but scared to take any strong medicine.  She does drive and likes her job.  She lives by herself and walks every day dog.  Her appetite is okay.  Her weight is stable.  She has upcoming appointment with her PCP Dr. Willette Alma.  She has no tremors, shakes or any EPS.  She denies any anxiety or any panic attack.  She denies any hallucination.    Past Psychiatric History:  H/O anxiety, bipolar d/o and inpatient in 2009.  No h/o suicidal attempt.  Lithium worked well but discontinued due to high creatinine.   Psychiatric Specialty Exam: Physical Exam  Review of Systems  Weight 208 lb (94.3 kg).There is no height or weight on file to calculate BMI.  General Appearance: NA  Eye Contact:  NA  Speech:  Clear and Coherent  Volume:  Normal  Mood:  Euthymic  Affect:  NA  Thought Process:  Goal Directed  Orientation:  Full (Time, Place, and Person)  Thought Content:  WDL  Suicidal  Thoughts:  No  Homicidal Thoughts:  No  Memory:  Immediate;   Good Recent;   Good Remote;   Good  Judgement:  Intact  Insight:  Present  Psychomotor Activity:  NA  Concentration:  Concentration: Fair and Attention Span: Fair  Recall:  Good  Fund of Knowledge:  Good  Language:  Good  Akathisia:  No  Handed:  Right  AIMS (if indicated):     Assets:  Communication Skills Desire for Improvement Housing Resilience Transportation  ADL's:  Intact  Cognition:  WNL  Sleep:   fair      Assessment and Plan: Bipolar disorder type I.  Anxiety.  Patient is stable on her current medication.  I encourage should consult with the PCP for nonnarcotic pain medicine to help her knee pain.  I also reminded her she needed a blood work for her Tegretol level and patient promised to have it done at her PCP office on her next appointment.  Continue Tegretol 200 mg twice a day, Risperdal 2 mg at bedtime and amitriptyline 50 mg at bedtime.  Recommended to call us back if she is any question or any concern.  Follow-up in 3 months.    Follow Up Instructions:    I discussed the assessment and treatment plan with the patient. The patient was provided an opportunity to ask questions and all were answered. The patient agreed with the plan and  demonstrated an understanding of the instructions.   The patient was advised to call back or seek an in-person evaluation if the symptoms worsen or if the condition fails to improve as anticipated.  I provided 19 minutes of non-face-to-face time during this encounter.   [name], MD

## 2021-01-17 ENCOUNTER — Other Ambulatory Visit (HOSPITAL_BASED_OUTPATIENT_CLINIC_OR_DEPARTMENT_OTHER): Payer: Self-pay

## 2021-01-23 ENCOUNTER — Telehealth (HOSPITAL_COMMUNITY): Payer: Medicare HMO | Admitting: Psychiatry

## 2021-01-28 DIAGNOSIS — M79671 Pain in right foot: Secondary | ICD-10-CM | POA: Diagnosis not present

## 2021-01-28 DIAGNOSIS — L602 Onychogryphosis: Secondary | ICD-10-CM | POA: Diagnosis not present

## 2021-01-28 DIAGNOSIS — M79672 Pain in left foot: Secondary | ICD-10-CM | POA: Diagnosis not present

## 2021-02-18 ENCOUNTER — Telehealth: Payer: Self-pay | Admitting: Family Medicine

## 2021-02-18 NOTE — Telephone Encounter (Signed)
Left message for patient to call back and schedule Medicare Annual Wellness Visit (AWV) in office.  ° °If not able to come in office, please offer to do virtually or by telephone.  Left office number and my jabber #[phone]. ° °Due for AWVI ° °Please schedule at anytime with Nurse Health Advisor. °  °

## 2021-02-21 ENCOUNTER — Ambulatory Visit (INDEPENDENT_AMBULATORY_CARE_PROVIDER_SITE_OTHER): Payer: Medicare PPO

## 2021-02-21 VITALS — Ht 67.0 in | Wt 208.0 lb

## 2021-02-21 DIAGNOSIS — Z1231 Encounter for screening mammogram for malignant neoplasm of breast: Secondary | ICD-10-CM

## 2021-02-21 DIAGNOSIS — Z780 Asymptomatic menopausal state: Secondary | ICD-10-CM | POA: Diagnosis not present

## 2021-02-21 DIAGNOSIS — Z0000 Encounter for general adult medical examination without abnormal findings: Secondary | ICD-10-CM | POA: Diagnosis not present

## 2021-02-21 NOTE — Patient Instructions (Signed)
Margaret [name] , Thank you for taking time to complete your Medicare Wellness Visit. I appreciate your ongoing commitment to your health goals. Please review the following plan we discussed and let me know if I can assist you in the future.   Screening recommendations/referrals: Colonoscopy: Declined Mammogram: Ordered today. Someone will call you to schedule. Bone Density: Ordered today. Someone will call you to schedule. Recommended yearly ophthalmology/optometry visit for glaucoma screening and checkup Recommended yearly dental visit for hygiene and checkup  Vaccinations: Influenza vaccine: Declined Pneumococcal vaccine:  Declined Tdap vaccine:  Up to date Shingles vaccine:  Declined   Covid-19: Declined  Advanced directives: Please bring a copy of Living Will and/or Healthcare Power of Attorney for your chart.   Conditions/risks identified: See problem list  Next appointment: Follow up in one year for your annual wellness visit 02/25/2022 @ 3:40.   Preventive Care 71 Years and Older, Female Preventive care refers to lifestyle choices and visits with your health care provider that can promote health and wellness. What does preventive care include? A yearly physical exam. This is also called an annual well check. Dental exams once or twice a year. Routine eye exams. Ask your health care provider how often you should have your eyes checked. Personal lifestyle choices, including: Daily care of your teeth and gums. Regular physical activity. Eating a healthy diet. Avoiding tobacco and drug use. Limiting alcohol use. Practicing safe sex. Taking low-dose aspirin every day. Taking vitamin and mineral supplements as recommended by your health care provider. What happens during an annual well check? The services and screenings done by your health care provider during your annual well check will depend on your age, overall health, lifestyle risk factors, and family history of  disease. Counseling  Your health care provider may ask you questions about your: Alcohol use. Tobacco use. Drug use. Emotional well-being. Home and relationship well-being. Sexual activity. Eating habits. History of falls. Memory and ability to understand (cognition). Work and work Statistician. Reproductive health. Screening  You may have the following tests or measurements: Height, weight, and BMI. Blood pressure. Lipid and cholesterol levels. These may be checked every 5 years, or more frequently if you are over 81 years old. Skin check. Lung cancer screening. You may have this screening every year starting at age 63 if you have a 30-pack-year history of smoking and currently smoke or have quit within the past 15 years. Fecal occult blood test (FOBT) of the stool. You may have this test every year starting at age 6. Flexible sigmoidoscopy or colonoscopy. You may have a sigmoidoscopy every 5 years or a colonoscopy every 10 years starting at age 72. Hepatitis C blood test. Hepatitis B blood test. Sexually transmitted disease (STD) testing. Diabetes screening. This is done by checking your blood sugar (glucose) after you have not eaten for a while (fasting). You may have this done every 1-3 years. Bone density scan. This is done to screen for osteoporosis. You may have this done starting at age 30. Mammogram. This may be done every 1-2 years. Talk to your health care provider about how often you should have regular mammograms. Talk with your health care provider about your test results, treatment options, and if necessary, the need for more tests. Vaccines  Your health care provider may recommend certain vaccines, such as: Influenza vaccine. This is recommended every year. Tetanus, diphtheria, and acellular pertussis (Tdap, Td) vaccine. You may need a Td booster every 10 years. Zoster vaccine. You may need this  after age 30. Pneumococcal 13-valent conjugate (PCV13) vaccine. One  dose is recommended after age 60. Pneumococcal polysaccharide (PPSV23) vaccine. One dose is recommended after age 61. Talk to your health care provider about which screenings and vaccines you need and how often you need them. This information is not intended to replace advice given to you by your health care provider. Make sure you discuss any questions you have with your health care provider. Document Released: 02/23/2015 Document Revised: 10/17/2015 Document Reviewed: 11/28/2014 Elsevier Interactive Patient Education  2017 [place] Prevention in the Home Falls can cause injuries. They can happen to people of all ages. There are many things you can do to make your home safe and to help prevent falls. What can I do on the outside of my home? Regularly fix the edges of walkways and driveways and fix any cracks. Remove anything that might make you trip as you walk through a door, such as a raised step or threshold. Trim any bushes or trees on the path to your home. Use bright outdoor lighting. Clear any walking paths of anything that might make someone trip, such as rocks or tools. Regularly check to see if handrails are loose or broken. Make sure that both sides of any steps have handrails. Any raised decks and porches should have guardrails on the edges. Have any leaves, snow, or ice cleared regularly. Use sand or salt on walking paths during winter. Clean up any spills in your garage right away. This includes oil or grease spills. What can I do in the bathroom? Use night lights. Install grab bars by the toilet and in the tub and shower. Do not use towel bars as grab bars. Use non-skid mats or decals in the tub or shower. If you need to sit down in the shower, use a plastic, non-slip stool. Keep the floor dry. Clean up any water that spills on the floor as soon as it happens. Remove soap buildup in the tub or shower regularly. Attach bath mats securely with double-sided  non-slip rug tape. Do not have throw rugs and other things on the floor that can make you trip. What can I do in the bedroom? Use night lights. Make sure that you have a light by your bed that is easy to reach. Do not use any sheets or blankets that are too big for your bed. They should not hang down onto the floor. Have a firm chair that has side arms. You can use this for support while you get dressed. Do not have throw rugs and other things on the floor that can make you trip. What can I do in the kitchen? Clean up any spills right away. Avoid walking on wet floors. Keep items that you use a lot in easy-to-reach places. If you need to reach something above you, use a strong step stool that has a grab bar. Keep electrical cords out of the way. Do not use floor polish or wax that makes floors slippery. If you must use wax, use non-skid floor wax. Do not have throw rugs and other things on the floor that can make you trip. What can I do with my stairs? Do not leave any items on the stairs. Make sure that there are handrails on both sides of the stairs and use them. Fix handrails that are broken or loose. Make sure that handrails are as long as the stairways. Check any carpeting to make sure that it is firmly attached to the  stairs. Fix any carpet that is loose or worn. Avoid having throw rugs at the top or bottom of the stairs. If you do have throw rugs, attach them to the floor with carpet tape. Make sure that you have a light switch at the top of the stairs and the bottom of the stairs. If you do not have them, ask someone to add them for you. What else can I do to help prevent falls? Wear shoes that: Do not have high heels. Have rubber bottoms. Are comfortable and fit you well. Are closed at the toe. Do not wear sandals. If you use a stepladder: Make sure that it is fully opened. Do not climb a closed stepladder. Make sure that both sides of the stepladder are locked into place. Ask  someone to hold it for you, if possible. Clearly mark and make sure that you can see: Any grab bars or handrails. First and last steps. Where the edge of each step is. Use tools that help you move around (mobility aids) if they are needed. These include: Canes. Walkers. Scooters. Crutches. Turn on the lights when you go into a dark area. Replace any light bulbs as soon as they burn out. Set up your furniture so you have a clear path. Avoid moving your furniture around. If any of your floors are uneven, fix them. If there are any pets around you, be aware of where they are. Review your medicines with your doctor. Some medicines can make you feel dizzy. This can increase your chance of falling. Ask your doctor what other things that you can do to help prevent falls. This information is not intended to replace advice given to you by your health care provider. Make sure you discuss any questions you have with your health care provider. Document Released: 11/23/2008 Document Revised: 07/05/2015 Document Reviewed: 03/03/2014 Elsevier Interactive Patient Education  2017 [employer].

## 2021-02-21 NOTE — Progress Notes (Signed)
Subjective:   Margaret [name] is a 71 y.o. female who presents for an Initial Medicare Annual Wellness Visit.  I connected with Yvonne today by telephone and verified that I am speaking with the correct person using two identifiers. Location patient: home Location provider: work Persons participating in the virtual visit: patient, Marine scientist.    I discussed the limitations, risks, security and privacy concerns of performing an evaluation and management service by telephone and the availability of in person appointments. I also discussed with the patient that there may be a patient responsible charge related to this service. The patient expressed understanding and verbally consented to this telephonic visit.    Interactive audio and video telecommunications were attempted between this provider and patient, however failed, due to patient having technical difficulties OR patient did not have access to video capability.  We continued and completed visit with audio only.  Some vital signs may be absent or patient reported.   Time Spent with patient on telephone encounter: 40 minutes   Review of Systems     Cardiac Risk Factors include: advanced age (>44men, >32 women);dyslipidemia;hypertension;obesity (BMI >30kg/m2)     Objective:    Today's Vitals   02/21/21 1541  Weight: 208 lb (94.3 kg)  Height: 5\' 7"  (1.702 m)  PainSc: 10-Worst pain ever   Body mass index is 32.58 kg/m.  Advanced Directives 02/21/2021 08/26/2016 08/09/2016  Does Patient Have a Medical Advance Directive? Yes Yes Yes  Type of Paramedic of [place];Living will - Living will  Copy of [place] in Chart? No - copy requested - -    Current Medications (verified) Outpatient Encounter Medications as of 02/21/2021  Medication Sig   amitriptyline (ELAVIL) 50 MG tablet TAKE 1 TABLET BY MOUTH EVERY NIGHT AT BEDTIME   carbamazepine (CARBATROL) 200 MG 12 hr capsule TAKE 1 CAPSULE BY MOUTH  TWICE DAILY   levothyroxine (SYNTHROID) 125 MCG tablet TAKE 1 TABLET BY MOUTH ONCE DAILY   omeprazole (PRILOSEC) 40 MG capsule Take 1 capsule (40 mg total) by mouth daily.   risperiDONE (RISPERDAL) 2 MG tablet TAKE 1 TABLET BY MOUTH EVERY NIGHT AT BEDTIME   rosuvastatin (CRESTOR) 5 MG tablet Take 5 mg by mouth daily.   Vitamin D, Ergocalciferol, (DRISDOL) 1.25 MG (50000 UNIT) CAPS capsule Take 1 capsule (50,000 Units total) by mouth every 7 (seven) days.   No facility-administered encounter medications on file as of 02/21/2021.    Allergies (verified) Novocain [procaine], Codeine, Darvon, and Sulfa drugs cross reactors   History: Past Medical History:  Diagnosis Date   Anxiety    Arthritis 02/14/2013   Bipolar disorder ([place])    Esophageal reflux 09/19/2012   only slight with gallbladder problems   Essential hypertension 09/28/2018   Hypoglycemia    Hypothyroidism    Other malaise and fatigue 09/19/2012   PONV (postoperative nausea and vomiting)    Preventative health care 06/09/2016   PTSD (post-traumatic stress disorder)    Thyroid disease    Past Surgical History:  Procedure Laterality Date   CHOLECYSTECTOMY N/A 09/01/2016   Procedure: LAPAROSCOPIC CHOLECYSTECTOMY WITH INTRAOPERATIVE CHOLANGIOGRAM;  Surgeon: Jovita Kussmaul, MD;  Location: [facility];  Service: General;  Laterality: N/A;   DG 4TH DIGIT LEFT FOOT     fooet surgery Left 2014   reconstruction of foot and ankle to correct deformity   NECK SURGERY     OTHER SURGICAL HISTORY     OTHER SURGICAL HISTORY  OTHER SURGICAL HISTORY     TUBAL LIGATION     reversal   Family History  Problem Relation Age of Onset   Diabetes Mother        Brother 52 of 2   Heart failure Mother    Prostate cancer Father    Parkinsonism Father        deceased   Hypertension Brother    Esophageal cancer Maternal Grandmother    Cancer Paternal Grandmother        cancer of jawbone 1 of 2   Melanoma Brother    Breast cancer Neg Hx    Colon  cancer Neg Hx    Social History   Socioeconomic History   Marital status: Divorced    Spouse name: Not on file   Number of children: Not on file   Years of education: Not on file   Highest education level: Not on file  Occupational History   Not on file  Tobacco Use   Smoking status: Former    Packs/day: 3.00    Years: 24.00    Pack years: 72.00    Types: Cigarettes    Quit date: 1995    Years since quitting: 28.0   Smokeless tobacco: Never  Vaping Use   Vaping Use: Never used  Substance and Sexual Activity   Alcohol use: No    Alcohol/week: 0.0 standard drinks   Drug use: No   Sexual activity: Not on file  Other Topics Concern   Not on file  Social History Narrative   Not on file   Social Determinants of Health   Financial Resource Strain: Low Risk    Difficulty of Paying Living Expenses: Not hard at all  Food Insecurity: No Food Insecurity   Worried About Charity fundraiser in the Last Year: Never true   [place] in the Last Year: Never true  Transportation Needs: No Transportation Needs   Lack of Transportation (Medical): No   Lack of Transportation (Non-Medical): No  Physical Activity: Inactive   Days of Exercise per Week: 0 days   Minutes of Exercise per Session: 0 min  Stress: No Stress Concern Present   Feeling of Stress : Not at all  Social Connections: Socially Isolated   Frequency of Communication with Friends and Family: Once a week   Frequency of Social Gatherings with Friends and Family: More than three times a week   Attends Religious Services: Never   Marine scientist or Organizations: No   Attends Music therapist: Never   Marital Status: Divorced    Tobacco Counseling Counseling given: Not Answered   Clinical Intake:  Pre-visit preparation completed: Yes  Pain : 0-10 Pain Score: 10-Worst pain ever (pt states her pain is from when she had covid) Pain Type: Chronic pain Pain Location: Back Pain Onset: More  than a month ago Pain Frequency: Constant     BMI - recorded: 32.58 Nutritional Status: BMI > 30  Obese Nutritional Risks: None Diabetes: No  How often do you need to have someone help you when you read instructions, pamphlets, or other written materials from your doctor or pharmacy?: 1 - Never  Diabetic?No  Interpreter Needed?: No  Information entered by :: Caroleen Hamman LPN   Activities of Daily Living In your present state of health, do you have any difficulty performing the following activities: 02/21/2021  Hearing? N  Vision? N  Difficulty concentrating or making decisions? N  Walking or climbing  stairs? Y  Comment stairs  Dressing or bathing? N  Doing errands, shopping? N  Preparing Food and eating ? N  Using the Toilet? N  In the past six months, have you accidently leaked urine? N  Do you have problems with loss of bowel control? N  Managing your Medications? N  Managing your Finances? N  Housekeeping or managing your Housekeeping? N  Some recent data might be hidden    Patient Care Team: Mosie Lukes, MD as PCP - General (Family Medicine)  Indicate any recent Medical Services you may have received from other than [facility] providers in the past year (date may be approximate).     Assessment:   This is a routine wellness examination for [place].  Hearing/Vision screen Hearing Screening - Comments:: C/o some hearing loss in right ear Vision Screening - Comments:: Last eye exam-12/2020-[facility]  Dietary issues and exercise activities discussed: Current Exercise Habits: The patient does not participate in regular exercise at present, Exercise limited by: None identified   Goals Addressed             This Visit's Progress    Patient Stated       Wants to be able to keep working       Depression Screen PHQ 2/9 Scores 02/21/2021 09/21/2018 06/09/2016 06/09/2016 02/01/2015 02/01/2015  PHQ - 2 Score 1 0 0 0 1 0    Fall Risk Fall Risk  02/21/2021  10/27/2019 06/09/2016 06/09/2016 02/01/2015  Falls in the past year? 1 - No No Yes  Number falls in past yr: 0 0 - - 1  Injury with Fall? 0 0 - - -  Risk for fall due to : - - - - Impaired balance/gait  Follow up Falls prevention discussed - - - -    FALL RISK PREVENTION PERTAINING TO THE HOME:  Any stairs in or around the home? No  Home free of loose throw rugs in walkways, pet beds, electrical cords, etc? Yes  Adequate lighting in your home to reduce risk of falls? Yes   ASSISTIVE DEVICES UTILIZED TO PREVENT FALLS:  Life alert? No  Use of a cane, walker or w/c? Yes  Grab bars in the bathroom? No  Shower chair or bench in shower? No  Elevated toilet seat or a handicapped toilet? No   TIMED UP AND GO:  Was the test performed? No . Phone visit   Cognitive Function:Normal cognitive status assessed by this Nurse Health Advisor. No abnormalities found.          Immunizations Immunization History  Administered Date(s) Administered   Influenza Split 11/11/2011   Influenza-Unspecified 12/13/2012   PPD Test 11/11/2011, 01/11/2021   Td 02/11/2008   Tdap 09/21/2018    TDAP status: Up to date  Flu Vaccine status: Declined, Education has been provided regarding the importance of this vaccine but patient still declined. Advised may receive this vaccine at local pharmacy or Health Dept. Aware to provide a copy of the vaccination record if obtained from local pharmacy or Health Dept. Verbalized acceptance and understanding.  Pneumococcal vaccine status: Declined,  Education has been provided regarding the importance of this vaccine but patient still declined. Advised may receive this vaccine at local pharmacy or Health Dept. Aware to provide a copy of the vaccination record if obtained from local pharmacy or Health Dept. Verbalized acceptance and understanding.   Covid-19 vaccine status: Declined, Education has been provided regarding the importance of this vaccine but patient still  declined. Advised may receive this vaccine at local pharmacy or Health Dept.or vaccine clinic. Aware to provide a copy of the vaccination record if obtained from local pharmacy or Health Dept. Verbalized acceptance and understanding.  Qualifies for Shingles Vaccine? Yes   Zostavax completed No   Shingrix Completed?: No.    Education has been provided regarding the importance of this vaccine. Patient has been advised to call insurance company to determine out of pocket expense if they have not yet received this vaccine. Advised may also receive vaccine at local pharmacy or Health Dept. Verbalized acceptance and understanding.  Screening Tests Health Maintenance  Topic Date Due   COVID-19 Vaccine (1) Never done   Pneumonia Vaccine 17+ Years old (1 - PCV) Never done   Zoster Vaccines- Shingrix (1 of 2) Never done   MAMMOGRAM  05/28/2019   INFLUENZA VACCINE  09/10/2020   COLONOSCOPY (Pts 45-42yrs Insurance coverage will need to be confirmed)  07/20/2021   TETANUS/TDAP  09/20/2028   DEXA SCAN  Completed   Hepatitis C Screening  Completed   HPV VACCINES  Aged Out    Health Maintenance  Health Maintenance Due  Topic Date Due   COVID-19 Vaccine (1) Never done   Pneumonia Vaccine 68+ Years old (1 - PCV) Never done   Zoster Vaccines- Shingrix (1 of 2) Never done   MAMMOGRAM  05/28/2019   INFLUENZA VACCINE  09/10/2020    Colorectal cancer screening: Declined  Mammogram status: Ordered today. Pt provided with contact info and advised to call to schedule appt.   Bone Density status: Ordered today. Pt provided with contact info and advised to call to schedule appt.  Lung Cancer Screening: (Low Dose CT Chest recommended if Age 56-80 years, 30 pack-year currently smoking OR have quit w/in 15years.) does not qualify.     Additional Screening:  Hepatitis C Screening: Completed 06/09/2016  Vision Screening: Recommended annual ophthalmology exams for early detection of glaucoma and other  disorders of the eye. Is the patient up to date with their annual eye exam?  Yes  Who is the provider or what is the name of the office in which the patient attends annual eye exams? [facility]   Dental Screening: Recommended annual dental exams for proper oral hygiene  Community Resource Referral / Chronic Care Management: CRR required this visit?  No   CCM required this visit?  No      Plan:     I have personally reviewed and noted the following in the patients chart:   Medical and social history Use of alcohol, tobacco or illicit drugs  Current medications and supplements including opioid prescriptions. Patient is not currently taking opioid prescriptions. Functional ability and status Nutritional status Physical activity Advanced directives List of other physicians Hospitalizations, surgeries, and ER visits in previous 12 months Vitals Screenings to include cognitive, depression, and falls Referrals and appointments  In addition, I have reviewed and discussed with patient certain preventive protocols, quality metrics, and best practice recommendations. A written personalized care plan for preventive services as well as general preventive health recommendations were provided to patient.   Due to this being a telephonic visit, the after visit summary with patients personalized plan was offered to patient via mail or my-chart. Patient would like to access on my-chart.   Marta Antu, LPN   [id number]/38/[id number]  Nurse Health Advisor  Nurse Notes: None

## 2021-02-23 ENCOUNTER — Telehealth (HOSPITAL_BASED_OUTPATIENT_CLINIC_OR_DEPARTMENT_OTHER): Payer: Self-pay

## 2021-03-18 ENCOUNTER — Ambulatory Visit (HOSPITAL_BASED_OUTPATIENT_CLINIC_OR_DEPARTMENT_OTHER): Payer: Medicare PPO

## 2021-03-18 ENCOUNTER — Other Ambulatory Visit (HOSPITAL_BASED_OUTPATIENT_CLINIC_OR_DEPARTMENT_OTHER): Payer: Medicare PPO

## 2021-04-09 ENCOUNTER — Other Ambulatory Visit (HOSPITAL_COMMUNITY): Payer: Self-pay | Admitting: Psychiatry

## 2021-04-09 ENCOUNTER — Other Ambulatory Visit: Payer: Self-pay | Admitting: Family Medicine

## 2021-04-09 ENCOUNTER — Other Ambulatory Visit (HOSPITAL_BASED_OUTPATIENT_CLINIC_OR_DEPARTMENT_OTHER): Payer: Self-pay

## 2021-04-09 DIAGNOSIS — F319 Bipolar disorder, unspecified: Secondary | ICD-10-CM

## 2021-04-09 DIAGNOSIS — F419 Anxiety disorder, unspecified: Secondary | ICD-10-CM

## 2021-04-10 ENCOUNTER — Other Ambulatory Visit (HOSPITAL_BASED_OUTPATIENT_CLINIC_OR_DEPARTMENT_OTHER): Payer: Self-pay

## 2021-04-10 MED ORDER — LEVOTHYROXINE SODIUM 125 MCG PO TABS
125.0000 ug | ORAL_TABLET | Freq: Every day | ORAL | 1 refills | Status: DC
  Filled 2021-04-10: qty 90, 90d supply, fill #0
  Filled 2021-07-08: qty 90, 90d supply, fill #1

## 2021-04-15 ENCOUNTER — Other Ambulatory Visit (HOSPITAL_BASED_OUTPATIENT_CLINIC_OR_DEPARTMENT_OTHER): Payer: Self-pay

## 2021-04-18 ENCOUNTER — Other Ambulatory Visit (HOSPITAL_BASED_OUTPATIENT_CLINIC_OR_DEPARTMENT_OTHER): Payer: Self-pay

## 2021-04-18 ENCOUNTER — Encounter: Payer: Self-pay | Admitting: Family Medicine

## 2021-04-18 ENCOUNTER — Ambulatory Visit (INDEPENDENT_AMBULATORY_CARE_PROVIDER_SITE_OTHER): Payer: Medicare PPO | Admitting: Family Medicine

## 2021-04-18 VITALS — BP 144/90 | HR 91 | Temp 97.8°F | Ht 67.0 in | Wt 193.8 lb

## 2021-04-18 DIAGNOSIS — J069 Acute upper respiratory infection, unspecified: Secondary | ICD-10-CM | POA: Diagnosis not present

## 2021-04-18 MED ORDER — AMOXICILLIN-POT CLAVULANATE 875-125 MG PO TABS
1.0000 | ORAL_TABLET | Freq: Two times a day (BID) | ORAL | 0 refills | Status: AC
  Filled 2021-04-18: qty 20, 10d supply, fill #0

## 2021-04-18 NOTE — Patient Instructions (Signed)
Start antibiotics.  ?Continue supportive measures including rest, hydration, humidifier use, steam showers, warm compresses to sinuses, warm liquids with lemon and honey, and over-the-counter cough, cold, and analgesics as needed.   ? ?Please contact office for follow-up if symptoms do not improve or worsen. Seek emergency care if symptoms become severe. ? ?

## 2021-04-18 NOTE — Progress Notes (Signed)
Cough ?Nasal congestion ?Weakness ?Nausea only ?Not tried any OTC meds ?Needs work note return on monday ?

## 2021-04-18 NOTE — Progress Notes (Signed)
Acute Office Visit  Subjective:    Patient ID: Margaret [name], female    DOB: 1950-09-03, 71 y.o.   MRN: [id number]  No chief complaint on file.   HPI Patient is in today for cough, URI.   Patient reports she initially started feeling bad about a week and a half ago.  At first she thought it may have been allergies, but then she reports she had been around a client and their family who had been coughing a lot but reported they were negative for COVID.  She has been gradually getting worse ever since then.  She has a productive cough, fatigue, rhinorrhea, nasal congestion, occasional wheezing.  She denies any fevers, chills, GI/GU symptoms, loss of taste/smell, sinus pressure, headaches, ear pain, chest pain, dyspnea.  States she does not tolerate cold/allergy medicine very well so she has only been taking Tylenol which is not making much difference.     Past Medical History:  Diagnosis Date   Anxiety    Arthritis 02/14/2013   Bipolar disorder ([place])    Esophageal reflux 09/19/2012   only slight with gallbladder problems   Essential hypertension 09/28/2018   Hypoglycemia    Hypothyroidism    Other malaise and fatigue 09/19/2012   PONV (postoperative nausea and vomiting)    Preventative health care 06/09/2016   PTSD (post-traumatic stress disorder)    Thyroid disease     Past Surgical History:  Procedure Laterality Date   CHOLECYSTECTOMY N/A 09/01/2016   Procedure: LAPAROSCOPIC CHOLECYSTECTOMY WITH INTRAOPERATIVE CHOLANGIOGRAM;  Surgeon: Jovita Kussmaul, MD;  Location: [facility] OR;  Service: General;  Laterality: N/A;   DG 4TH DIGIT LEFT FOOT     fooet surgery Left 2014   reconstruction of foot and ankle to correct deformity   NECK SURGERY     OTHER SURGICAL HISTORY     OTHER SURGICAL HISTORY     OTHER SURGICAL HISTORY     TUBAL LIGATION     reversal    Family History  Problem Relation Age of Onset   Diabetes Mother        Brother 50 of 2   Heart failure Mother    Prostate cancer  Father    Parkinsonism Father        deceased   Hypertension Brother    Esophageal cancer Maternal Grandmother    Cancer Paternal Grandmother        cancer of jawbone 1 of 2   Melanoma Brother    Breast cancer Neg Hx    Colon cancer Neg Hx     Social History   Socioeconomic History   Marital status: Divorced    Spouse name: Not on file   Number of children: Not on file   Years of education: Not on file   Highest education level: Not on file  Occupational History   Not on file  Tobacco Use   Smoking status: Former    Packs/day: 3.00    Years: 24.00    Pack years: 72.00    Types: Cigarettes    Quit date: 1995    Years since quitting: 28.2   Smokeless tobacco: Never  Vaping Use   Vaping Use: Never used  Substance and Sexual Activity   Alcohol use: No    Alcohol/week: 0.0 standard drinks   Drug use: No   Sexual activity: Not on file  Other Topics Concern   Not on file  Social History Narrative   Not on file   Social  Determinants of Health   Financial Resource Strain: Low Risk    Difficulty of Paying Living Expenses: Not hard at all  Food Insecurity: No Food Insecurity   Worried About [place] in the Last Year: Never true   Ran Out of Food in the Last Year: Never true  Transportation Needs: No Transportation Needs   Lack of Transportation (Medical): No   Lack of Transportation (Non-Medical): No  Physical Activity: Inactive   Days of Exercise per Week: 0 days   Minutes of Exercise per Session: 0 min  Stress: No Stress Concern Present   Feeling of Stress : Not at all  Social Connections: Socially Isolated   Frequency of Communication with Friends and Family: Once a week   Frequency of Social Gatherings with Friends and Family: More than three times a week   Attends Religious Services: Never   Marine scientist or Organizations: No   Attends Music therapist: Never   Marital Status: Divorced  Human resources officer Violence: Not At Risk    Fear of Current or Ex-Partner: No   Emotionally Abused: No   Physically Abused: No   Sexually Abused: No    Outpatient Medications Prior to Visit  Medication Sig Dispense Refill   amitriptyline (ELAVIL) 50 MG tablet TAKE 1 TABLET BY MOUTH EVERY NIGHT AT BEDTIME 90 tablet 0   carbamazepine (CARBATROL) 200 MG 12 hr capsule TAKE 1 CAPSULE BY MOUTH TWICE DAILY 180 capsule 0   levothyroxine (SYNTHROID) 125 MCG tablet Take 1 tablet (125 mcg total) by mouth daily. 90 tablet 1   risperiDONE (RISPERDAL) 2 MG tablet TAKE 1 TABLET BY MOUTH EVERY NIGHT AT BEDTIME 90 tablet 0   omeprazole (PRILOSEC) 40 MG capsule Take 1 capsule (40 mg total) by mouth daily. 30 capsule 3   rosuvastatin (CRESTOR) 5 MG tablet Take 5 mg by mouth daily.     Vitamin D, Ergocalciferol, (DRISDOL) 1.25 MG (50000 UNIT) CAPS capsule Take 1 capsule (50,000 Units total) by mouth every 7 (seven) days. 4 capsule 4   No facility-administered medications prior to visit.    Allergies  Allergen Reactions   Novocain [Procaine] Other (See Comments)    Heart race   Codeine Nausea Only   Darvon Other (See Comments)    Hallucinations    Sulfa Drugs Cross Reactors Nausea And Vomiting    Review of Systems All review of systems negative except what is listed in the HPI     Objective:    Physical Exam Vitals reviewed.  Constitutional:      Appearance: Normal appearance. She is obese.  HENT:     Head: Normocephalic and atraumatic.     Nose: Nose normal.     Mouth/Throat:     Mouth: Mucous membranes are moist.     Pharynx: Oropharynx is clear.  Cardiovascular:     Rate and Rhythm: Normal rate and regular rhythm.  Pulmonary:     Effort: Pulmonary effort is normal.     Breath sounds: Normal breath sounds. No wheezing, rhonchi or rales.  Musculoskeletal:     Cervical back: Normal range of motion and neck supple. No tenderness.  Lymphadenopathy:     Cervical: No cervical adenopathy.  Skin:    General: Skin is warm and  dry.  Neurological:     General: No focal deficit present.     Mental Status: She is alert and oriented to person, place, and time. Mental status is at baseline.  Psychiatric:  Mood and Affect: Mood normal.        Behavior: Behavior normal.        Thought Content: Thought content normal.        Judgment: Judgment normal.    BP (!) 144/90    Pulse 91    Temp 97.8 F (36.6 C)    Ht '5\' 7"'$  (1.702 m)    Wt 193 lb 12.8 oz (87.9 kg)    SpO2 98%    BMI 30.35 kg/m  Wt Readings from Last 3 Encounters:  04/18/21 193 lb 12.8 oz (87.9 kg)  02/21/21 208 lb (94.3 kg)  04/10/20 201 lb 3.2 oz (91.3 kg)    Health Maintenance Due  Topic Date Due   COVID-19 Vaccine (1) Never done   Zoster Vaccines- Shingrix (1 of 2) Never done   Pneumonia Vaccine 30+ Years old (1 - PCV) Never done   MAMMOGRAM  05/28/2019   INFLUENZA VACCINE  09/10/2020    There are no preventive care reminders to display for this patient.   Lab Results  Component Value Date   TSH 2.98 04/10/2020   Lab Results  Component Value Date   WBC 6.6 04/10/2020   HGB 13.9 04/10/2020   HCT 40.5 04/10/2020   MCV 95.4 04/10/2020   PLT 337.0 04/10/2020   Lab Results  Component Value Date   NA 137 04/10/2020   K 4.0 04/10/2020   CO2 25 04/10/2020   GLUCOSE 142 (H) 04/10/2020   BUN 29 (H) 04/10/2020   CREATININE 1.06 04/10/2020   BILITOT 0.3 04/10/2020   ALKPHOS 104 04/10/2020   AST 12 04/10/2020   ALT 16 04/10/2020   PROT 6.5 04/10/2020   ALBUMIN 4.0 04/10/2020   CALCIUM 9.5 04/10/2020   ANIONGAP 4 (L) 08/26/2016   GFR 53.58 (L) 04/10/2020   Lab Results  Component Value Date   CHOL 264 (H) 10/27/2019   Lab Results  Component Value Date   HDL 51 10/27/2019   Lab Results  Component Value Date   LDLCALC 171 (H) 10/27/2019   Lab Results  Component Value Date   TRIG 258 (H) 10/27/2019   Lab Results  Component Value Date   CHOLHDL 5.2 (H) 10/27/2019   Lab Results  Component Value Date   HGBA1C 6.0 (H)  10/27/2019       Assessment & Plan:   1. Upper respiratory tract infection, unspecified type Given duration, starting antibiotics.  She declines needing any other medicines at this time states she is very sensitive to cold/allergy medicines and prefers to avoid for now. Continue supportive measures including rest, hydration, humidifier use, steam showers, warm compresses to sinuses, warm liquids with lemon and honey, and over-the-counter cough, cold, and analgesics as needed.   Patient aware of signs/symptoms requiring further/urgent evaluation.   - amoxicillin-clavulanate (AUGMENTIN) 875-125 MG tablet; Take 1 tablet by mouth 2 (two) times daily for 10 days.  Dispense: 20 tablet; Refill: 0   Please contact office for follow-up if symptoms do not improve or worsen. Seek emergency care if symptoms become severe.     Terrilyn Saver, NP

## 2021-04-22 ENCOUNTER — Other Ambulatory Visit: Payer: Self-pay

## 2021-04-22 ENCOUNTER — Encounter (HOSPITAL_BASED_OUTPATIENT_CLINIC_OR_DEPARTMENT_OTHER): Payer: Self-pay

## 2021-04-22 ENCOUNTER — Other Ambulatory Visit (HOSPITAL_BASED_OUTPATIENT_CLINIC_OR_DEPARTMENT_OTHER): Payer: Medicare PPO

## 2021-04-22 ENCOUNTER — Ambulatory Visit (HOSPITAL_BASED_OUTPATIENT_CLINIC_OR_DEPARTMENT_OTHER)
Admission: RE | Admit: 2021-04-22 | Discharge: 2021-04-22 | Disposition: A | Discharge status: Home / Self Care | Payer: Medicare PPO | Source: Ambulatory Visit | Attending: Family Medicine | Admitting: Family Medicine

## 2021-04-22 ENCOUNTER — Telehealth (HOSPITAL_BASED_OUTPATIENT_CLINIC_OR_DEPARTMENT_OTHER): Payer: Medicare PPO | Admitting: Psychiatry

## 2021-04-22 ENCOUNTER — Encounter (HOSPITAL_COMMUNITY): Payer: Self-pay | Admitting: Psychiatry

## 2021-04-22 ENCOUNTER — Other Ambulatory Visit (HOSPITAL_BASED_OUTPATIENT_CLINIC_OR_DEPARTMENT_OTHER): Payer: Self-pay

## 2021-04-22 DIAGNOSIS — Z1231 Encounter for screening mammogram for malignant neoplasm of breast: Secondary | ICD-10-CM | POA: Diagnosis not present

## 2021-04-22 DIAGNOSIS — F319 Bipolar disorder, unspecified: Secondary | ICD-10-CM | POA: Diagnosis not present

## 2021-04-22 DIAGNOSIS — F419 Anxiety disorder, unspecified: Secondary | ICD-10-CM

## 2021-04-22 MED ORDER — CARBAMAZEPINE ER 200 MG PO CP12
ORAL_CAPSULE | Freq: Two times a day (BID) | ORAL | 0 refills | Status: DC
  Filled 2021-04-22: qty 180, 90d supply, fill #0
  Filled 2021-04-24: qty 60, 30d supply, fill #0
  Filled 2021-05-06 – 2021-05-17 (×4): qty 60, 30d supply, fill #1
  Filled 2021-06-23: qty 60, 30d supply, fill #2

## 2021-04-22 MED ORDER — RISPERIDONE 2 MG PO TABS
ORAL_TABLET | Freq: Every day | ORAL | 0 refills | Status: DC
  Filled 2021-04-22: qty 90, 90d supply, fill #0

## 2021-04-22 MED ORDER — AMITRIPTYLINE HCL 50 MG PO TABS
ORAL_TABLET | Freq: Every day | ORAL | 0 refills | Status: DC
  Filled 2021-04-22: qty 90, 90d supply, fill #0

## 2021-04-22 NOTE — Progress Notes (Signed)
Virtual Visit via Telephone Note ? ?I connected with Margaret [name] on 04/22/21 at  2:40 PM EDT by telephone and verified that I am speaking with the correct person using two identifiers. ? ?Location: ?Patient: Home ?Provider: Home Office ?  ?I discussed the limitations, risks, security and privacy concerns of performing an evaluation and management service by telephone and the availability of in person appointments. I also discussed with the patient that there may be a patient responsible charge related to this service. The patient expressed understanding and agreed to proceed. ? ? ?History of Present Illness: ?Patient is evaluated by phone session.  She has cough and today she is taking day off.  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  She lost 3 pounds since the last visit.  She apologized not having blood work but promised to have it done as she has blood work coming up from her PCP in 3 months.  She feels a current medicine is working and denies any mania, psychosis, hallucination or any anger.  She sleeps good.  She has no tremor or shakes or any EPS.  She lives by herself and walk every day her dog and that helps her a lot.  She has good energy.  She denies any panic attack.  She like to keep the current medication. ? ? ?Past Psychiatric History:  ?H/O anxiety, bipolar d/o and inpatient in 2009.  No h/o suicidal attempt.  Lithium worked well but discontinued due to high creatinine.  ? ? ?Psychiatric Specialty Exam: ?Physical Exam  ?Review of Systems  ?Weight 193 lb (87.5 kg).There is no height or weight on file to calculate BMI.  ?General Appearance: NA  ?Eye Contact:  NA  ?Speech:  Normal Rate  ?Volume:  Normal  ?Mood:  Euthymic  ?Affect:  NA  ?Thought Process:  Goal Directed  ?Orientation:  Full (Time, Place, and Person)  ?Thought Content:  Logical  ?Suicidal Thoughts:  No  ?Homicidal Thoughts:  No  ?Memory:  Immediate;   Good ?Recent;   Good ?Remote;   Good  ?Judgement:   Intact  ?Insight:  Present  ?Psychomotor Activity:  NA  ?Concentration:  Concentration: Good and Attention Span: Fair  ?Recall:  Good  ?Fund of Knowledge:  Good  ?Language:  Good  ?Akathisia:  No  ?Handed:  Right  ?AIMS (if indicated):     ?Assets:  Communication Skills ?Desire for Improvement ?Housing ?Resilience ?Talents/Skills ?Transportation  ?ADL's:  Intact  ?Cognition:  WNL  ?Sleep:   ok  ? ? ? ? ?Assessment and Plan: ?Bipolar disorder type I.  Anxiety. ? ?Patient stable on her current medication.  I again reminded to have a Tegretol level and patient promised to have it done soon as she has upcoming blood work appointment soon.  We will continue Tegretol 200 mg twice a day, Risperdal 2 mg at bedtime and amitriptyline 50 mg at bedtime.  Patient has cough and she is resting.  Discussed medication side effects and benefits.  Recommended to call us back if she has any question or any concern.  Follow-up in 3 months. ? ?Follow Up Instructions: ? ?  ?I discussed the assessment and treatment plan with the patient. The patient was provided an opportunity to ask questions and all were answered. The patient agreed with the plan and demonstrated an understanding of the instructions. ?  ?The patient was advised to call back or seek an in-person evaluation if the symptoms worsen  or if the condition fails to improve as anticipated. ? ?I provided 16 minutes of non-face-to-face time during this encounter. ? ? ?[name], MD  ?

## 2021-04-24 ENCOUNTER — Other Ambulatory Visit (HOSPITAL_BASED_OUTPATIENT_CLINIC_OR_DEPARTMENT_OTHER): Payer: Self-pay

## 2021-04-24 ENCOUNTER — Telehealth (HOSPITAL_COMMUNITY): Payer: Self-pay

## 2021-04-24 NOTE — Telephone Encounter (Signed)
Patient called regarding her Carbamazepine (Carbatrol) '200mg'$ . She stated she only got 30. Writer returned her call to let her know that Dr. Adele Schilder had sent it in on 04/22/22 #180 for a 90 day supply (1 capsule po BID). ?PT DIDN'T PICK UP PHONE SO WRITER LVM TO RELAY MESSAGE and INFORMED TO CALL BACK IF SHE HAS ANY OTHER ISSUES WITH HER MEDICATION ?

## 2021-04-25 ENCOUNTER — Other Ambulatory Visit: Payer: Self-pay

## 2021-04-25 ENCOUNTER — Ambulatory Visit (HOSPITAL_BASED_OUTPATIENT_CLINIC_OR_DEPARTMENT_OTHER)
Admission: RE | Admit: 2021-04-25 | Discharge: 2021-04-25 | Disposition: A | Discharge status: Home / Self Care | Payer: Medicare PPO | Source: Ambulatory Visit | Attending: Family Medicine | Admitting: Family Medicine

## 2021-04-25 DIAGNOSIS — M85852 Other specified disorders of bone density and structure, left thigh: Secondary | ICD-10-CM | POA: Diagnosis not present

## 2021-04-25 DIAGNOSIS — Z780 Asymptomatic menopausal state: Secondary | ICD-10-CM | POA: Diagnosis not present

## 2021-04-29 DIAGNOSIS — M79672 Pain in left foot: Secondary | ICD-10-CM | POA: Diagnosis not present

## 2021-04-29 DIAGNOSIS — L602 Onychogryphosis: Secondary | ICD-10-CM | POA: Diagnosis not present

## 2021-04-29 DIAGNOSIS — M79671 Pain in right foot: Secondary | ICD-10-CM | POA: Diagnosis not present

## 2021-05-06 ENCOUNTER — Other Ambulatory Visit (HOSPITAL_BASED_OUTPATIENT_CLINIC_OR_DEPARTMENT_OTHER): Payer: Self-pay

## 2021-05-06 ENCOUNTER — Telehealth (HOSPITAL_COMMUNITY): Payer: Self-pay

## 2021-05-06 NOTE — Telephone Encounter (Signed)
SUBMITTED A PA FOR PT'S CARBAMAZEPINE (CARBATROL) '200MG'$  12 HR CAPSULE ?SENT FOR REVIEW. WAITING ON DETERMINATION ? ?REFERENCE # [id number] ?S/W [facility] ? ?

## 2021-05-07 ENCOUNTER — Telehealth (HOSPITAL_COMMUNITY): Payer: Self-pay

## 2021-05-07 ENCOUNTER — Other Ambulatory Visit (HOSPITAL_BASED_OUTPATIENT_CLINIC_OR_DEPARTMENT_OTHER): Payer: Self-pay

## 2021-05-07 ENCOUNTER — Other Ambulatory Visit (HOSPITAL_COMMUNITY): Payer: Self-pay | Admitting: Psychiatry

## 2021-05-07 DIAGNOSIS — F319 Bipolar disorder, unspecified: Secondary | ICD-10-CM

## 2021-05-07 NOTE — Telephone Encounter (Signed)
[employer] ?APPROVED  CARBATROL ER '200MG'$  CAPSULE ?REFERENCE # [id number] ?EFFECTIVE 05/06/2021 TO 02/09/2022 ? ?S/W [facility] ?NOTIFIED PHARMACY. PHARMACY RAN MEDICATION AND IT CAME UP REJECTED, NOT ON FORMULARY. ?WRITER CALLED [employer] [facility] REVIEW WHICH IS WHO SENT [place] THE APPROVAL. THEY'RE LOOKING INTO IT AND WRITER IS WAITING FOR A CALL BACK  ?

## 2021-05-07 NOTE — Telephone Encounter (Signed)
WRITER S/W [facility]. THEY STATED THAT PT STILL HAS #120 - A 2 MONTH SUPPLY- LEFT ON HER CARBAMAZEPINE (CARBATROL) '200MG'$  12 HR CAP SCRIPT. PT PICKED UP #60, A 30 DAY SUPPLY, ON 3/15 SO SHE WILL BE DUE FOR A REFILL AROUND THE MIDDLE OF April. PA SHOULD GO THROUGH AT THE TIME OF HER NEXT REFILL ?

## 2021-05-08 ENCOUNTER — Other Ambulatory Visit (HOSPITAL_BASED_OUTPATIENT_CLINIC_OR_DEPARTMENT_OTHER): Payer: Self-pay

## 2021-05-17 ENCOUNTER — Other Ambulatory Visit (HOSPITAL_BASED_OUTPATIENT_CLINIC_OR_DEPARTMENT_OTHER): Payer: Self-pay

## 2021-05-20 ENCOUNTER — Other Ambulatory Visit (HOSPITAL_BASED_OUTPATIENT_CLINIC_OR_DEPARTMENT_OTHER): Payer: Self-pay

## 2021-06-24 ENCOUNTER — Other Ambulatory Visit (HOSPITAL_BASED_OUTPATIENT_CLINIC_OR_DEPARTMENT_OTHER): Payer: Self-pay

## 2021-07-02 ENCOUNTER — Ambulatory Visit (INDEPENDENT_AMBULATORY_CARE_PROVIDER_SITE_OTHER): Payer: Medicare PPO | Admitting: Medical

## 2021-07-02 ENCOUNTER — Encounter: Payer: Self-pay | Admitting: Medical

## 2021-07-02 VITALS — BP 150/80 | HR 93 | Resp 18 | Ht 67.0 in | Wt 198.0 lb

## 2021-07-02 DIAGNOSIS — F3111 Bipolar disorder, current episode manic without psychotic features, mild: Secondary | ICD-10-CM | POA: Diagnosis not present

## 2021-07-02 DIAGNOSIS — I10 Essential (primary) hypertension: Secondary | ICD-10-CM | POA: Diagnosis not present

## 2021-07-02 DIAGNOSIS — R5383 Other fatigue: Secondary | ICD-10-CM

## 2021-07-02 DIAGNOSIS — E039 Hypothyroidism, unspecified: Secondary | ICD-10-CM | POA: Diagnosis not present

## 2021-07-02 DIAGNOSIS — E538 Deficiency of other specified B group vitamins: Secondary | ICD-10-CM

## 2021-07-02 DIAGNOSIS — Z5181 Encounter for therapeutic drug level monitoring: Secondary | ICD-10-CM | POA: Diagnosis not present

## 2021-07-02 DIAGNOSIS — M25552 Pain in left hip: Secondary | ICD-10-CM

## 2021-07-02 DIAGNOSIS — E559 Vitamin D deficiency, unspecified: Secondary | ICD-10-CM | POA: Diagnosis not present

## 2021-07-02 NOTE — Patient Instructions (Addendum)
For recent fatigue will get cbc, cmp, tsh, t4, b12, b1, iron and vit D.  Vit d deficiency. Will get level today. Continue with vit d otc. Depneding on level might give rx.  Bp better at home. Hx of white coat bp elevation.  Hypothyroid- follow tsh and t4 level.  For bipolor continue on current meds rx'd by psychiatrist. Will get carbamapezine level.  Referral to sports med MD placed for left hip pain.   Follow up in as regular scheduled with pcp but sooner if needed.

## 2021-07-02 NOTE — Progress Notes (Signed)
Subjective:    Patient ID: Margaret [name], female    DOB: October 16, 1950, 71 y.o.   MRN: [id number]  HPI   Pt in for recent fatigue. She states no energy and some hair loss. She thinks maybe thyroid.   Pt states sleeping fine but states always tired.  Pt thinks she does not snore.  Psychiatrist wants pt to have carbamepazine level. Mood controlled but her chronic left hip pain effects her mood.   Pt state seen by ortho for left hip pain. Got injection but only helped for 2 days. [facility] visit note that day 05-08-2021.  Medical Decision Making Elements: The patient's problem falls under the category of chronic illness with exacerbation/complication/side effect. The condition is not yet stable or at goal. Data/Analysis included:ordering 1 unique test. Risk of complications or morbidity of patient management should be considered low.  Review of Systems  Constitutional:  Positive for fatigue. Negative for chills and fever.  HENT:  Negative for dental problem.   Respiratory:  Negative for cough, choking, chest tightness, shortness of breath and wheezing.   Cardiovascular:  Negative for chest pain and palpitations.  Gastrointestinal:  Negative for abdominal pain.  Musculoskeletal:  Negative for back pain, myalgias and neck pain.  Neurological:  Negative for dizziness, light-headedness and headaches.  Psychiatric/Behavioral:  Negative for behavioral problems and decreased concentration.     Past Medical History:  Diagnosis Date   Anxiety    Arthritis 02/14/2013   Bipolar disorder ([place])    Esophageal reflux 09/19/2012   only slight with gallbladder problems   Essential hypertension 09/28/2018   Hypoglycemia    Hypothyroidism    Other malaise and fatigue 09/19/2012   PONV (postoperative nausea and vomiting)    Preventative health care 06/09/2016   PTSD (post-traumatic stress disorder)    Thyroid disease      Social History   Socioeconomic History   Marital status: Divorced    Spouse  name: Not on file   Number of children: Not on file   Years of education: Not on file   Highest education level: Not on file  Occupational History   Not on file  Tobacco Use   Smoking status: Former    Packs/day: 3.00    Years: 24.00    Pack years: 72.00    Types: Cigarettes    Quit date: 32    Years since quitting: 28.4   Smokeless tobacco: Never  Vaping Use   Vaping Use: Never used  Substance and Sexual Activity   Alcohol use: No    Alcohol/week: 0.0 standard drinks   Drug use: No   Sexual activity: Not on file  Other Topics Concern   Not on file  Social History Narrative   Not on file   Social Determinants of Health   Financial Resource Strain: Low Risk    Difficulty of Paying Living Expenses: Not hard at all  Food Insecurity: No Food Insecurity   Worried About Charity fundraiser in the Last Year: Never true   [place] in the Last Year: Never true  Transportation Needs: No Transportation Needs   Lack of Transportation (Medical): No   Lack of Transportation (Non-Medical): No  Physical Activity: Inactive   Days of Exercise per Week: 0 days   Minutes of Exercise per Session: 0 min  Stress: No Stress Concern Present   Feeling of Stress : Not at all  Social Connections: Socially Isolated   Frequency of Communication with Friends and  Family: Once a week   Frequency of Social Gatherings with Friends and Family: More than three times a week   Attends Religious Services: Never   Marine scientist or Organizations: No   Attends Archivist Meetings: Never   Marital Status: Divorced  Human resources officer Violence: Not At Risk   Fear of Current or Ex-Partner: No   Emotionally Abused: No   Physically Abused: No   Sexually Abused: No    Past Surgical History:  Procedure Laterality Date   CHOLECYSTECTOMY N/A 09/01/2016   Procedure: LAPAROSCOPIC CHOLECYSTECTOMY WITH INTRAOPERATIVE CHOLANGIOGRAM;  Surgeon: Jovita Kussmaul, MD;  Location: [facility] OR;  Service:  General;  Laterality: N/A;   DG 4TH DIGIT LEFT FOOT     fooet surgery Left 2014   reconstruction of foot and ankle to correct deformity   NECK SURGERY     OTHER SURGICAL HISTORY     OTHER SURGICAL HISTORY     OTHER SURGICAL HISTORY     TUBAL LIGATION     reversal    Family History  Problem Relation Age of Onset   Diabetes Mother        Brother 37 of 2   Heart failure Mother    Prostate cancer Father    Parkinsonism Father        deceased   Hypertension Brother    Esophageal cancer Maternal Grandmother    Cancer Paternal Grandmother        cancer of jawbone 1 of 2   Melanoma Brother    Breast cancer Neg Hx    Colon cancer Neg Hx     Allergies  Allergen Reactions   Novocain [Procaine] Other (See Comments)    Heart race   Codeine Nausea Only   Darvon Other (See Comments)    Hallucinations    Sulfa Drugs Cross Reactors Nausea And Vomiting    Current Outpatient Medications on File Prior to Visit  Medication Sig Dispense Refill   amitriptyline (ELAVIL) 50 MG tablet TAKE 1 TABLET BY MOUTH EVERY NIGHT AT BEDTIME 90 tablet 0   carbamazepine (CARBATROL) 200 MG 12 hr capsule TAKE 1 CAPSULE BY MOUTH TWICE DAILY 180 capsule 0   levothyroxine (SYNTHROID) 125 MCG tablet Take 1 tablet (125 mcg total) by mouth daily. 90 tablet 1   risperiDONE (RISPERDAL) 2 MG tablet TAKE 1 TABLET BY MOUTH EVERY NIGHT AT BEDTIME 90 tablet 0   No current facility-administered medications on file prior to visit.    BP (!) 150/80   Pulse 93   Resp 18   Ht '5\' 7"'$  (1.702 m)   Wt 198 lb (89.8 kg)   SpO2 98%   BMI 31.01 kg/m       Objective:   Physical Exam  General- No acute distress. Pleasant patient. Neck- Full range of motion, no jvd Lungs- Clear, even and unlabored. Heart- regular rate and rhythm. Neurologic- CNII- XII grossly intact.   Left hip- pain on range of motion.     Assessment & Plan:   Patient Instructions  For recent fatigue will get cbc, cmp, tsh, t4, b12, b1, iron and  vit D.  Vit d deficiency. Will get level today. Continue with vit d otc. Depneding on level might give rx.  Bp better at home. Hx of white coat bp elevation.  Hypothyroid- follow tsh and t4 level.  For bipolor continue on current meds rx'd by psychiatrist. Will get carbamapezine level.   Follow up in as regular scheduled with pcp  but sooner if needed.    Mackie Pai, PA-C

## 2021-07-03 ENCOUNTER — Other Ambulatory Visit (HOSPITAL_BASED_OUTPATIENT_CLINIC_OR_DEPARTMENT_OTHER): Payer: Self-pay

## 2021-07-03 LAB — VITAMIN B12: Vitamin B-12: 248 pg/mL (ref 211–911)

## 2021-07-03 LAB — CBC WITH DIFFERENTIAL/PLATELET
Basophils Absolute: 0.1 10*3/uL (ref 0.0–0.1)
Basophils Relative: 0.9 % (ref 0.0–3.0)
Eosinophils Absolute: 0.3 10*3/uL (ref 0.0–0.7)
Eosinophils Relative: 4.7 % (ref 0.0–5.0)
HCT: 43.5 % (ref 36.0–46.0)
Hemoglobin: 14.7 g/dL (ref 12.0–15.0)
Lymphocytes Relative: 20.4 % (ref 12.0–46.0)
Lymphs Abs: 1.4 10*3/uL (ref 0.7–4.0)
MCHC: 33.7 g/dL (ref 30.0–36.0)
MCV: 96.2 fl (ref 78.0–100.0)
Monocytes Absolute: 0.8 10*3/uL (ref 0.1–1.0)
Monocytes Relative: 11.4 % (ref 3.0–12.0)
Neutro Abs: 4.4 10*3/uL (ref 1.4–7.7)
Neutrophils Relative %: 62.6 % (ref 43.0–77.0)
Platelets: 232 10*3/uL (ref 150.0–400.0)
RBC: 4.52 Mil/uL (ref 3.87–5.11)
RDW: 12.9 % (ref 11.5–15.5)
WBC: 7.1 10*3/uL (ref 4.0–10.5)

## 2021-07-03 LAB — COMPREHENSIVE METABOLIC PANEL
ALT: 9 U/L (ref 0–35)
AST: 11 U/L (ref 0–37)
Albumin: 4.4 g/dL (ref 3.5–5.2)
Alkaline Phosphatase: 91 U/L (ref 39–117)
BUN: 18 mg/dL (ref 6–23)
CO2: 27 mEq/L (ref 19–32)
Calcium: 9.7 mg/dL (ref 8.4–10.5)
Chloride: 106 mEq/L (ref 96–112)
Creatinine, Ser: 0.93 mg/dL (ref 0.40–1.20)
GFR: 62.15 mL/min (ref >60.00)
Glucose, Bld: 85 mg/dL (ref 70–99)
Potassium: 4.1 mEq/L (ref 3.5–5.1)
Sodium: 141 mEq/L (ref 135–145)
Total Bilirubin: 0.3 mg/dL (ref 0.2–1.2)
Total Protein: 6.5 g/dL (ref 6.0–8.3)

## 2021-07-03 LAB — IRON: Iron: 84 ug/dL (ref 42–145)

## 2021-07-03 LAB — TSH: TSH: 0.66 u[IU]/mL (ref 0.35–5.50)

## 2021-07-03 LAB — VITAMIN D 25 HYDROXY (VIT D DEFICIENCY, FRACTURES): VITD: 28.85 ng/mL — ABNORMAL LOW (ref 30.00–100.00)

## 2021-07-03 LAB — T4, FREE: Free T4: 1.09 ng/dL (ref 0.60–1.60)

## 2021-07-03 MED ORDER — VITAMIN D (ERGOCALCIFEROL) 1.25 MG (50000 UNIT) PO CAPS
50000.0000 [IU] | ORAL_CAPSULE | ORAL | 0 refills | Status: DC
  Filled 2021-07-03: qty 8, 56d supply, fill #0

## 2021-07-03 NOTE — Addendum Note (Signed)
Addended by: Anabel Halon on: 07/03/2021 12:41 PM   Modules accepted: Orders

## 2021-07-03 NOTE — Addendum Note (Signed)
Addended by: Anabel Halon on: 07/03/2021 12:44 PM   Modules accepted: Orders

## 2021-07-08 LAB — VITAMIN B1: Vitamin B1 (Thiamine): 6 nmol/L — ABNORMAL LOW (ref 8–30)

## 2021-07-09 ENCOUNTER — Other Ambulatory Visit (HOSPITAL_BASED_OUTPATIENT_CLINIC_OR_DEPARTMENT_OTHER): Payer: Self-pay

## 2021-07-16 ENCOUNTER — Other Ambulatory Visit (HOSPITAL_BASED_OUTPATIENT_CLINIC_OR_DEPARTMENT_OTHER): Payer: Self-pay

## 2021-07-23 ENCOUNTER — Telehealth (HOSPITAL_BASED_OUTPATIENT_CLINIC_OR_DEPARTMENT_OTHER): Payer: Medicare PPO | Admitting: Psychiatry

## 2021-07-23 ENCOUNTER — Other Ambulatory Visit (HOSPITAL_BASED_OUTPATIENT_CLINIC_OR_DEPARTMENT_OTHER): Payer: Self-pay

## 2021-07-23 ENCOUNTER — Encounter (HOSPITAL_COMMUNITY): Payer: Self-pay | Admitting: Psychiatry

## 2021-07-23 DIAGNOSIS — F419 Anxiety disorder, unspecified: Secondary | ICD-10-CM | POA: Diagnosis not present

## 2021-07-23 DIAGNOSIS — F319 Bipolar disorder, unspecified: Secondary | ICD-10-CM | POA: Diagnosis not present

## 2021-07-23 MED ORDER — CARBAMAZEPINE ER 200 MG PO CP12
ORAL_CAPSULE | Freq: Two times a day (BID) | ORAL | 2 refills | Status: DC
  Filled 2021-07-23: qty 60, 30d supply, fill #0
  Filled 2021-08-20: qty 60, 30d supply, fill #1
  Filled 2021-09-24: qty 60, 30d supply, fill #2

## 2021-07-23 MED ORDER — CARBAMAZEPINE ER 200 MG PO CP12
ORAL_CAPSULE | Freq: Two times a day (BID) | ORAL | 0 refills | Status: DC
  Filled 2021-07-23: qty 180, fill #0

## 2021-07-23 MED ORDER — AMITRIPTYLINE HCL 50 MG PO TABS
ORAL_TABLET | Freq: Every day | ORAL | 0 refills | Status: DC
  Filled 2021-07-23: qty 90, 90d supply, fill #0

## 2021-07-23 MED ORDER — RISPERIDONE 2 MG PO TABS
ORAL_TABLET | Freq: Every day | ORAL | 0 refills | Status: DC
  Filled 2021-07-23: qty 90, 90d supply, fill #0

## 2021-07-23 NOTE — Progress Notes (Signed)
Virtual Visit via Video Note  I connected with Margaret [name] on 07/23/21 at  2:20 PM EDT by a video enabled telemedicine application and verified that I am speaking with the correct person using two identifiers.  Location: Patient: Home Provider: Home Office   I discussed the limitations of evaluation and management by telemedicine and the availability of in person appointments. The patient expressed understanding and agreed to proceed.  History of Present Illness: Patient is evaluated by video session.  She is taking Risperdal, amitriptyline and Tegretol.  She is frustrated because she has been complaining of fatigue, tired with decreased energy and had a visit to her PCP.  She had blood work and her vitamin D and B12 level were low.  She was given the medication but she started to have heart palpitation, racing thoughts and she did consult with the pharmacist who recommended to stop.  She had stopped the B12 but is still have racing thoughts and heart palpitation but hoping to get better in few days.  We have recommended Tegretol level and it was not done.  Patient told that specifically ask her PCP to do the Tegretol level but not happy why it was not done.  She feels otherwise her mood is stable.  She denies any highs and lows, mania, anger and agitation.  She will live by herself and working part-time as a Programmer, applications.  Like to keep herself busy.  She lives with her dog.  She denies any suicidal thoughts or homicidal thoughts.  She has good social network.  She has no tremor or shakes or any EPS.  Past Psychiatric History:  H/O anxiety, bipolar d/o and inpatient in 2009.  No h/o suicidal attempt.  Lithium worked well but discontinued due to high creatinine.   Recent Results (from the past 2160 hour(s))  CBC w/Diff     Status: None   Collection Time: 07/02/21  2:17 PM  Result Value Ref Range   WBC 7.1 4.0 - 10.5 K/uL   RBC 4.52 3.87 - 5.11 Mil/uL   Hemoglobin 14.7 12.0 - 15.0 g/dL   HCT  43.5 36.0 - 46.0 %   MCV 96.2 78.0 - 100.0 fl   MCHC 33.7 30.0 - 36.0 g/dL   RDW 12.9 11.5 - 15.5 %   Platelets 232.0 150.0 - 400.0 K/uL   Neutrophils Relative % 62.6 43.0 - 77.0 %   Lymphocytes Relative 20.4 12.0 - 46.0 %   Monocytes Relative 11.4 3.0 - 12.0 %   Eosinophils Relative 4.7 0.0 - 5.0 %   Basophils Relative 0.9 0.0 - 3.0 %   Neutro Abs 4.4 1.4 - 7.7 K/uL   Lymphs Abs 1.4 0.7 - 4.0 K/uL   Monocytes Absolute 0.8 0.1 - 1.0 K/uL   Eosinophils Absolute 0.3 0.0 - 0.7 K/uL   Basophils Absolute 0.1 0.0 - 0.1 K/uL  Comp Met (CMET)     Status: None   Collection Time: 07/02/21  2:17 PM  Result Value Ref Range   Sodium 141 135 - 145 mEq/L   Potassium 4.1 3.5 - 5.1 mEq/L   Chloride 106 96 - 112 mEq/L   CO2 27 19 - 32 mEq/L   Glucose, Bld 85 70 - 99 mg/dL   BUN 18 6 - 23 mg/dL   Creatinine, Ser 0.93 0.40 - 1.20 mg/dL   Total Bilirubin 0.3 0.2 - 1.2 mg/dL   Alkaline Phosphatase 91 39 - 117 U/L   AST 11 0 - 37 U/L  ALT 9 0 - 35 U/L   Total Protein 6.5 6.0 - 8.3 g/dL   Albumin 4.4 3.5 - 5.2 g/dL   GFR 62.15 >60.00 mL/min    Comment: Calculated using the CKD-EPI Creatinine Equation (2021)   Calcium 9.7 8.4 - 10.5 mg/dL  TSH     Status: None   Collection Time: 07/02/21  2:17 PM  Result Value Ref Range   TSH 0.66 0.35 - 5.50 uIU/mL  T4, free     Status: None   Collection Time: 07/02/21  2:17 PM  Result Value Ref Range   Free T4 1.09 0.60 - 1.60 ng/dL    Comment: Specimens from patients who are undergoing biotin therapy and /or ingesting biotin supplements may contain high levels of biotin.  The higher biotin concentration in these specimens interferes with this Free T4 assay.  Specimens that contain high levels  of biotin may cause false high results for this Free T4 assay.  Please interpret results in light of the total clinical presentation of the patient.    Vitamin B1     Status: Abnormal   Collection Time: 07/02/21  2:17 PM  Result Value Ref Range   Vitamin B1  (Thiamine) 6 (L) 8 - 30 nmol/L    Comment: Marland Kitchen Vitamin supplementation within 24 hours prior to blood draw may affect the accuracy of the results. . This test was developed and its analytical performance characteristics have been determined by [place], [place]. It has not been cleared or approved by the U.S. Food and Drug Administration. This assay has been validated pursuant to the CLIA regulations and is used for clinical purposes. .   B12     Status: None   Collection Time: 07/02/21  2:17 PM  Result Value Ref Range   Vitamin B-12 248 211 - 911 pg/mL  Iron     Status: None   Collection Time: 07/02/21  2:17 PM  Result Value Ref Range   Iron 84 42 - 145 ug/dL  VITAMIN D 25 Hydroxy (Vit-D Deficiency, Fractures)     Status: Abnormal   Collection Time: 07/02/21  2:17 PM  Result Value Ref Range   VITD 28.85 (L) 30.00 - 100.00 ng/mL      Psychiatric Specialty Exam: Physical Exam  Review of Systems  Cardiovascular:  Positive for palpitations.  Musculoskeletal:        Hip pain    Weight 198 lb (89.8 kg).There is no height or weight on file to calculate BMI.  General Appearance: Casual  Eye Contact:  Fair  Speech:   fast  Volume:  Increased  Mood:  Anxious  Affect:  Congruent  Thought Process:  Goal Directed  Orientation:  Full (Time, Place, and Person)  Thought Content:  Logical  Suicidal Thoughts:  No  Homicidal Thoughts:  No  Memory:  Immediate;   Good Recent;   Fair Remote;   Fair  Judgement:  Intact  Insight:  Present  Psychomotor Activity:  Increased  Concentration:  Concentration: Fair and Attention Span: Fair  Recall:  Good  Fund of Knowledge:  Good  Language:  Good  Akathisia:  No  Handed:  Right  AIMS (if indicated):     Assets:  Communication Skills Desire for [place] [employer]/Skills Transportation  ADL's:  Intact  Cognition:  WNL  Sleep:         Assessment and Plan: Bipolar disorder  type I.  Anxiety.  I reviewed blood work results.  Her blood sugar is normal.  She has no Tegretol level and I emphasized that we need to do the Tegretol level and if her primary care physician cannot do then we may have to do it.  Patient promised that she will call the PCP.  She also wants to in touch with the PCP for the concerns of the side effects of vitamins.  She also complaining of hip pain and like to reestablish care with Hulan Saas for pain management.  She does not want to change the medication since mood is stable on the medication.  Continue Risperdal 2 mg at bedtime, amitriptyline 50 mg at bedtime and Tegretol 200 mg twice a day.  We will need the Tegretol level for future refills.  Recommended to call us back if is any question or any concern.  Follow-up in 3 months.  Follow Up Instructions:    I discussed the assessment and treatment plan with the patient. The patient was provided an opportunity to ask questions and all were answered. The patient agreed with the plan and demonstrated an understanding of the instructions.   The patient was advised to call back or seek an in-person evaluation if the symptoms worsen or if the condition fails to improve as anticipated.  Collaboration of Care: Primary Care Provider AEB notes are available in epic to review.  Patient/Guardian was advised Release of Information must be obtained prior to any record release in order to collaborate their care with an outside provider. Patient/Guardian was advised if they have not already done so to contact the registration department to sign all necessary forms in order for [place] to release information regarding their care.   Consent: Patient/Guardian gives verbal consent for treatment and assignment of benefits for services provided during this visit. Patient/Guardian expressed understanding and agreed to proceed.    I provided 25 minutes of non-face-to-face time during this encounter.   [name], MD

## 2021-07-24 ENCOUNTER — Other Ambulatory Visit (INDEPENDENT_AMBULATORY_CARE_PROVIDER_SITE_OTHER): Payer: Medicare PPO

## 2021-07-24 ENCOUNTER — Other Ambulatory Visit: Payer: Self-pay | Admitting: *Deleted

## 2021-07-24 DIAGNOSIS — F3111 Bipolar disorder, current episode manic without psychotic features, mild: Secondary | ICD-10-CM

## 2021-07-24 DIAGNOSIS — Z5181 Encounter for therapeutic drug level monitoring: Secondary | ICD-10-CM | POA: Diagnosis not present

## 2021-07-25 ENCOUNTER — Telehealth (HOSPITAL_COMMUNITY): Payer: Self-pay | Admitting: *Deleted

## 2021-07-25 ENCOUNTER — Telehealth: Payer: Self-pay | Admitting: Family Medicine

## 2021-07-25 LAB — CARBAMAZEPINE LEVEL, TOTAL: Carbamazepine Lvl: 7 mg/L (ref 4.0–12.0)

## 2021-07-25 NOTE — Telephone Encounter (Signed)
Pt called stating that the recent Carbamazepine Levels Labs need to be sent to the following specialist, when possible:  Nydia Bouton, MD Behavioral Health Phone: [phone]

## 2021-07-25 NOTE — Telephone Encounter (Signed)
Received lab results from [facility]  Tegretol level: 7.0 mg/L

## 2021-07-25 NOTE — Telephone Encounter (Signed)
Labs faxed to [phone]

## 2021-07-29 DIAGNOSIS — M79671 Pain in right foot: Secondary | ICD-10-CM | POA: Diagnosis not present

## 2021-07-29 DIAGNOSIS — M79672 Pain in left foot: Secondary | ICD-10-CM | POA: Diagnosis not present

## 2021-07-29 DIAGNOSIS — L84 Corns and callosities: Secondary | ICD-10-CM | POA: Diagnosis not present

## 2021-07-29 DIAGNOSIS — L602 Onychogryphosis: Secondary | ICD-10-CM | POA: Diagnosis not present

## 2021-07-29 NOTE — Progress Notes (Unsigned)
[facility]ach Jamilyn [facility] [employer] [address] [place] Phone: [phone] Subjective:   IVilma Meckel, am serving as a scribe for Dr. Hulan Saas.  I'm seeing this patient by the request  of:  Mosie Lukes, MD  CC: left hip pain   [id number]  Margaret [name] is a 71 y.o. female coming in with complaint of left hip and lower back pain. Sciatica pain on the right side. Chronic pains getting worse. Hip pain is in the joint. Back pain is over spine near tail bone. Tightness and a constant dull ache but gets sharp with movements. Right side nerve pain when driving mostly and in sitting positions. Vit B12 shot 2 weeks ago starting to wear off. Will take a tylenol.       Past Medical History:  Diagnosis Date   Anxiety    Arthritis 02/14/2013   Bipolar disorder ([place])    Esophageal reflux 09/19/2012   only slight with gallbladder problems   Essential hypertension 09/28/2018   Hypoglycemia    Hypothyroidism    Other malaise and fatigue 09/19/2012   PONV (postoperative nausea and vomiting)    Preventative health care 06/09/2016   PTSD (post-traumatic stress disorder)    Thyroid disease    Past Surgical History:  Procedure Laterality Date   CHOLECYSTECTOMY N/A 09/01/2016   Procedure: LAPAROSCOPIC CHOLECYSTECTOMY WITH INTRAOPERATIVE CHOLANGIOGRAM;  Surgeon: Jovita Kussmaul, MD;  Location: [facility] OR;  Service: General;  Laterality: N/A;   DG 4TH DIGIT LEFT FOOT     fooet surgery Left 2014   reconstruction of foot and ankle to correct deformity   NECK SURGERY     OTHER SURGICAL HISTORY     OTHER SURGICAL HISTORY     OTHER SURGICAL HISTORY     TUBAL LIGATION     reversal   Social History   Socioeconomic History   Marital status: Divorced    Spouse name: Not on file   Number of children: Not on file   Years of education: Not on file   Highest education level: Not on file  Occupational History   Not on file  Tobacco Use   Smoking status: Former     Packs/day: 3.00    Years: 24.00    Total pack years: 72.00    Types: Cigarettes    Quit date: 84    Years since quitting: 28.4   Smokeless tobacco: Never  Vaping Use   Vaping Use: Never used  Substance and Sexual Activity   Alcohol use: No    Alcohol/week: 0.0 standard drinks of alcohol   Drug use: No   Sexual activity: Not on file  Other Topics Concern   Not on file  Social History Narrative   Not on file   Social Determinants of Health   Financial Resource Strain: Low Risk  (02/21/2021)   Overall Financial Resource Strain (CARDIA)    Difficulty of Paying Living Expenses: Not hard at all  Food Insecurity: No Food Insecurity (02/21/2021)   Hunger Vital Sign    Worried About Running Out of Food in the Last Year: Never true    Ran Out of Food in the Last Year: Never true  Transportation Needs: No Transportation Needs (02/21/2021)   PRAPARE - Hydrologist (Medical): No    Lack of Transportation (Non-Medical): No  Physical Activity: Inactive (02/21/2021)   Exercise Vital Sign    Days of Exercise per Week: 0 days  Minutes of Exercise per Session: 0 min  Stress: No Stress Concern Present (02/21/2021)   [place]    Feeling of Stress : Not at all  Social Connections: Socially Isolated (02/21/2021)   Social Connection and Isolation Panel [NHANES]    Frequency of Communication with Friends and Family: Once a week    Frequency of Social Gatherings with Friends and Family: More than three times a week    Attends Religious Services: Never    Marine scientist or Organizations: No    Attends Music therapist: Never    Marital Status: Divorced   Allergies  Allergen Reactions   Novocain [Procaine] Other (See Comments)    Heart race   Codeine Nausea Only   Darvon Other (See Comments)    Hallucinations    Sulfa Drugs Cross Reactors Nausea And Vomiting   Family History   Problem Relation Age of Onset   Diabetes Mother        Brother 1 of 2   Heart failure Mother    Prostate cancer Father    Parkinsonism Father        deceased   Hypertension Brother    Esophageal cancer Maternal Grandmother    Cancer Paternal Grandmother        cancer of jawbone 1 of 2   Melanoma Brother    Breast cancer Neg Hx    Colon cancer Neg Hx     Current Outpatient Medications (Endocrine & Metabolic):    levothyroxine (SYNTHROID) 125 MCG tablet, Take 1 tablet (125 mcg total) by mouth daily.      Current Outpatient Medications (Other):    amitriptyline (ELAVIL) 50 MG tablet, TAKE 1 TABLET BY MOUTH EVERY NIGHT AT BEDTIME   carbamazepine (CARBATROL) 200 MG 12 hr capsule, TAKE 1 CAPSULE BY MOUTH TWICE DAILY   risperiDONE (RISPERDAL) 2 MG tablet, TAKE 1 TABLET BY MOUTH EVERY NIGHT AT BEDTIME   Vitamin D, Ergocalciferol, (DRISDOL) 1.25 MG (50000 UNIT) CAPS capsule, Take 1 capsule (50,000 Units total) by mouth every 7 (seven) days.    Review of Systems:  No headache, visual changes, nausea, vomiting, diarrhea, constipation, dizziness, abdominal pain, skin rash, fevers, chills, night sweats, weight loss, swollen lymph nodes,  joint swelling, chest pain, shortness of breath, mood changes. POSITIVE muscle aches, body aches  Objective  Pulse (!) 106, height '5\' 7"'$  (1.702 m), weight 198 lb (89.8 kg), SpO2 98 %.   General: No apparent distress alert and oriented x3 mood and affect normal, dressed appropriately.  HEENT: Pupils equal, extraocular movements intact  Respiratory: Patient's speak in full sentences and does not appear short of breath  Cardiovascular: No lower extremity edema, non tender, no erythema  Left hip exam shows no internal range of motion, minimal outside as well. Weakness noted  Patient with rotation of the leg.  Back exam severe tender to palpation mostly in the sacroiliac joint patient has a range of motion of the back also noted. Significantly antalgic  gait using the aid of a cane   Impression and Recommendations:     The above documentation has been reviewed and is accurate and complete Lyndal Pulley, DO

## 2021-07-30 ENCOUNTER — Ambulatory Visit: Payer: Medicare PPO | Admitting: Family Medicine

## 2021-07-30 ENCOUNTER — Ambulatory Visit (INDEPENDENT_AMBULATORY_CARE_PROVIDER_SITE_OTHER): Payer: Medicare PPO

## 2021-07-30 VITALS — HR 106 | Ht 67.0 in | Wt 198.0 lb

## 2021-07-30 DIAGNOSIS — M25552 Pain in left hip: Secondary | ICD-10-CM

## 2021-07-30 DIAGNOSIS — M1612 Unilateral primary osteoarthritis, left hip: Secondary | ICD-10-CM | POA: Diagnosis not present

## 2021-07-30 DIAGNOSIS — M8588 Other specified disorders of bone density and structure, other site: Secondary | ICD-10-CM | POA: Diagnosis not present

## 2021-07-30 NOTE — Assessment & Plan Note (Signed)
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  The patient is nearly bone-on-bone.  Patient does have moderate to severe also on the contralateral side.  Patient is also having back pain and x-rays do show the patient does have severe degenerative disc disease at the L5-S1 but I do feel that the hip is the most severe aspect.  At this point will refer the patient to orthopedic surgery to discuss the possibility of surgical management patient is in agreement with the plan at the moment.  Can come back if any other questions or concerns

## 2021-07-30 NOTE — Patient Instructions (Addendum)
Referral to Dr. Ninfa Linden Here if you have questions

## 2021-08-02 ENCOUNTER — Other Ambulatory Visit (HOSPITAL_BASED_OUTPATIENT_CLINIC_OR_DEPARTMENT_OTHER): Payer: Self-pay

## 2021-08-19 NOTE — Progress Notes (Signed)
Subjective:    Patient ID: Margaret [name], female    DOB: Apr 16, 1950, 71 y.o.   MRN: [id number]  No chief complaint on file.   HPI Patient is in today for a CPE.  Past Medical History:  Diagnosis Date   Anxiety    Arthritis 02/14/2013   Bipolar disorder ([place])    Esophageal reflux 09/19/2012   only slight with gallbladder problems   Essential hypertension 09/28/2018   Hypoglycemia    Hypothyroidism    Other malaise and fatigue 09/19/2012   PONV (postoperative nausea and vomiting)    Preventative health care 06/09/2016   PTSD (post-traumatic stress disorder)    Thyroid disease     Past Surgical History:  Procedure Laterality Date   CHOLECYSTECTOMY N/A 09/01/2016   Procedure: LAPAROSCOPIC CHOLECYSTECTOMY WITH INTRAOPERATIVE CHOLANGIOGRAM;  Surgeon: Jovita Kussmaul, MD;  Location: [facility] OR;  Service: General;  Laterality: N/A;   DG 4TH DIGIT LEFT FOOT     fooet surgery Left 2014   reconstruction of foot and ankle to correct deformity   NECK SURGERY     OTHER SURGICAL HISTORY     OTHER SURGICAL HISTORY     OTHER SURGICAL HISTORY     TUBAL LIGATION     reversal    Family History  Problem Relation Age of Onset   Diabetes Mother        Brother 26 of 2   Heart failure Mother    Prostate cancer Father    Parkinsonism Father        deceased   Hypertension Brother    Esophageal cancer Maternal Grandmother    Cancer Paternal Grandmother        cancer of jawbone 1 of 2   Melanoma Brother    Breast cancer Neg Hx    Colon cancer Neg Hx     Social History   Socioeconomic History   Marital status: Divorced    Spouse name: Not on file   Number of children: Not on file   Years of education: Not on file   Highest education level: Not on file  Occupational History   Not on file  Tobacco Use   Smoking status: Former    Packs/day: 3.00    Years: 24.00    Total pack years: 72.00    Types: Cigarettes    Quit date: 1995    Years since quitting: 28.5   Smokeless tobacco: Never   Vaping Use   Vaping Use: Never used  Substance and Sexual Activity   Alcohol use: No    Alcohol/week: 0.0 standard drinks of alcohol   Drug use: No   Sexual activity: Not on file  Other Topics Concern   Not on file  Social History Narrative   Not on file   Social Determinants of Health   Financial Resource Strain: Low Risk  (02/21/2021)   Overall Financial Resource Strain (CARDIA)    Difficulty of Paying Living Expenses: Not hard at all  Food Insecurity: No Food Insecurity (02/21/2021)   Hunger Vital Sign    Worried About Running Out of Food in the Last Year: Never true    Ran Out of Food in the Last Year: Never true  Transportation Needs: No Transportation Needs (02/21/2021)   PRAPARE - Hydrologist (Medical): No    Lack of Transportation (Non-Medical): No  Physical Activity: Inactive (02/21/2021)   Exercise Vital Sign    Days of Exercise per Week: 0 days  Minutes of Exercise per Session: 0 min  Stress: No Stress Concern Present (02/21/2021)   [place]    Feeling of Stress : Not at all  Social Connections: Socially Isolated (02/21/2021)   Social Connection and Isolation Panel [NHANES]    Frequency of Communication with Friends and Family: Once a week    Frequency of Social Gatherings with Friends and Family: More than three times a week    Attends Religious Services: Never    Marine scientist or Organizations: No    Attends Archivist Meetings: Never    Marital Status: Divorced  Human resources officer Violence: Not At Risk (02/21/2021)   Humiliation, Afraid, Rape, and Kick questionnaire    Fear of Current or Ex-Partner: No    Emotionally Abused: No    Physically Abused: No    Sexually Abused: No    Outpatient Medications Prior to Visit  Medication Sig Dispense Refill   amitriptyline (ELAVIL) 50 MG tablet TAKE 1 TABLET BY MOUTH EVERY NIGHT AT BEDTIME 90 tablet 0    carbamazepine (CARBATROL) 200 MG 12 hr capsule TAKE 1 CAPSULE BY MOUTH TWICE DAILY 60 capsule 2   levothyroxine (SYNTHROID) 125 MCG tablet Take 1 tablet (125 mcg total) by mouth daily. 90 tablet 1   risperiDONE (RISPERDAL) 2 MG tablet TAKE 1 TABLET BY MOUTH EVERY NIGHT AT BEDTIME 90 tablet 0   Vitamin D, Ergocalciferol, (DRISDOL) 1.25 MG (50000 UNIT) CAPS capsule Take 1 capsule (50,000 Units total) by mouth every 7 (seven) days. 8 capsule 0   No facility-administered medications prior to visit.    Allergies  Allergen Reactions   Novocain [Procaine] Other (See Comments)    Heart race   Codeine Nausea Only   Darvon Other (See Comments)    Hallucinations    Sulfa Drugs Cross Reactors Nausea And Vomiting    ROS     Objective:    Physical Exam  There were no vitals taken for this visit. Wt Readings from Last 3 Encounters:  07/30/21 198 lb (89.8 kg)  07/02/21 198 lb (89.8 kg)  04/18/21 193 lb 12.8 oz (87.9 kg)    Diabetic Foot Exam - Simple   No data filed    Lab Results  Component Value Date   WBC 7.1 07/02/2021   HGB 14.7 07/02/2021   HCT 43.5 07/02/2021   PLT 232.0 07/02/2021   GLUCOSE 85 07/02/2021   CHOL 264 (H) 10/27/2019   TRIG 258 (H) 10/27/2019   HDL 51 10/27/2019   LDLDIRECT 131.0 03/24/2019   LDLCALC 171 (H) 10/27/2019   ALT 9 07/02/2021   AST 11 07/02/2021   NA 141 07/02/2021   K 4.1 07/02/2021   CL 106 07/02/2021   CREATININE 0.93 07/02/2021   BUN 18 07/02/2021   CO2 27 07/02/2021   TSH 0.66 07/02/2021   HGBA1C 6.0 (H) 10/27/2019    Lab Results  Component Value Date   TSH 0.66 07/02/2021   Lab Results  Component Value Date   WBC 7.1 07/02/2021   HGB 14.7 07/02/2021   HCT 43.5 07/02/2021   MCV 96.2 07/02/2021   PLT 232.0 07/02/2021   Lab Results  Component Value Date   NA 141 07/02/2021   K 4.1 07/02/2021   CO2 27 07/02/2021   GLUCOSE 85 07/02/2021   BUN 18 07/02/2021   CREATININE 0.93 07/02/2021   BILITOT 0.3 07/02/2021   ALKPHOS  91 07/02/2021   AST 11 07/02/2021  ALT 9 07/02/2021   PROT 6.5 07/02/2021   ALBUMIN 4.4 07/02/2021   CALCIUM 9.7 07/02/2021   ANIONGAP 4 (L) 08/26/2016   GFR 62.15 07/02/2021   Lab Results  Component Value Date   CHOL 264 (H) 10/27/2019   Lab Results  Component Value Date   HDL 51 10/27/2019   Lab Results  Component Value Date   LDLCALC 171 (H) 10/27/2019   Lab Results  Component Value Date   TRIG 258 (H) 10/27/2019   Lab Results  Component Value Date   CHOLHDL 5.2 (H) 10/27/2019   Lab Results  Component Value Date   HGBA1C 6.0 (H) 10/27/2019       Assessment & Plan:   COLONOSCOPY: pt declines MAMMO: 04/2021 PAP: 05/2016 PSA: n/a DEXA: 04/2021, osteopenic   Problem List Items Addressed This Visit   None   I am having Margaret [name] maintain her levothyroxine, Vitamin D (Ergocalciferol), amitriptyline, risperiDONE, and carbamazepine.  No orders of the defined types were placed in this encounter.

## 2021-08-20 ENCOUNTER — Encounter: Payer: Self-pay | Admitting: Family Medicine

## 2021-08-20 ENCOUNTER — Other Ambulatory Visit (HOSPITAL_BASED_OUTPATIENT_CLINIC_OR_DEPARTMENT_OTHER): Payer: Self-pay

## 2021-08-20 ENCOUNTER — Ambulatory Visit (INDEPENDENT_AMBULATORY_CARE_PROVIDER_SITE_OTHER): Payer: Medicare PPO | Admitting: Family Medicine

## 2021-08-20 VITALS — BP 124/82 | HR 104 | Resp 20 | Ht 67.0 in

## 2021-08-20 DIAGNOSIS — I10 Essential (primary) hypertension: Secondary | ICD-10-CM | POA: Diagnosis not present

## 2021-08-20 DIAGNOSIS — N289 Disorder of kidney and ureter, unspecified: Secondary | ICD-10-CM

## 2021-08-20 DIAGNOSIS — B029 Zoster without complications: Secondary | ICD-10-CM

## 2021-08-20 DIAGNOSIS — E785 Hyperlipidemia, unspecified: Secondary | ICD-10-CM | POA: Diagnosis not present

## 2021-08-20 DIAGNOSIS — R739 Hyperglycemia, unspecified: Secondary | ICD-10-CM | POA: Diagnosis not present

## 2021-08-20 DIAGNOSIS — M1990 Unspecified osteoarthritis, unspecified site: Secondary | ICD-10-CM

## 2021-08-20 DIAGNOSIS — F3111 Bipolar disorder, current episode manic without psychotic features, mild: Secondary | ICD-10-CM

## 2021-08-20 DIAGNOSIS — M1612 Unilateral primary osteoarthritis, left hip: Secondary | ICD-10-CM | POA: Diagnosis not present

## 2021-08-20 DIAGNOSIS — E039 Hypothyroidism, unspecified: Secondary | ICD-10-CM

## 2021-08-20 DIAGNOSIS — E559 Vitamin D deficiency, unspecified: Secondary | ICD-10-CM

## 2021-08-20 DIAGNOSIS — Z0000 Encounter for general adult medical examination without abnormal findings: Secondary | ICD-10-CM

## 2021-08-20 MED ORDER — VITAMIN D (ERGOCALCIFEROL) 1.25 MG (50000 UNIT) PO CAPS
50000.0000 [IU] | ORAL_CAPSULE | ORAL | 1 refills | Status: DC
  Filled 2021-08-20: qty 8, 56d supply, fill #0

## 2021-08-20 NOTE — Assessment & Plan Note (Signed)
Patient encouraged to maintain heart healthy diet, regular exercise, adequate sleep. Consider daily probiotics. Take medications as prescribed. Labs ordered and reviewed. MGM and Dexa in March of 2023. Repeat mgm in 1-2 years. Dexa in 2-5 years. Declines all colonoscopy and further paps due to pain

## 2021-08-20 NOTE — Assessment & Plan Note (Signed)
hgba1c acceptable, minimize simple carbs. Increase exercise as tolerated.  

## 2021-08-20 NOTE — Patient Instructions (Signed)
Thiamine 100 mg daily  Vitamin D 1000 IU daily   Thiamine Tablets What is this medication? THIAMINE (THAY uh min) prevents and treats low thiamine levels in your body. Thiamine (vitamin B1) plays an important role in turning food into energy. It also helps maintain the health of your heart, nerves, and digestive tract. This medicine may be used for other purposes; ask your health care provider or pharmacist if you have questions. What should I tell my care team before I take this medication? They need to know if you have any of the following conditions: Wernicke's disease An unusual or allergic reaction to B vitamins, other medications, foods, dyes, or preservatives Pregnant or trying to get pregnant Breast-feeding How should I use this medication? Take this medication by mouth with a glass of water. Follow the directions on the package or prescription label. For best results take this medication with food. Take your medication at regular intervals. Do not take your medication more often than directed. Talk to your care team about the use of this medication in children. While this medication may be prescribed for selected conditions, precautions do apply. Overdosage: If you think you have taken too much of this medicine contact a poison control center or emergency room at once. NOTE: This medicine is only for you. Do not share this medicine with others. What if I miss a dose? If you miss a dose, take it as soon as you can. If it is almost time for your next dose, take only that dose. Do not take double or extra doses. What may interact with this medication? Interactions are not expected. This list may not describe all possible interactions. Give your health care provider a list of all the medicines, herbs, non-prescription drugs, or dietary supplements you use. Also tell them if you smoke, drink alcohol, or use illegal drugs. Some items may interact with your medicine. What should I watch  for while using this medication? Follow a healthy diet. Taking a vitamin supplement does not replace the need for a balanced diet. Some foods that have this vitamin naturally are yeast, beans, peas, nuts, pork, and beef. Limit alcohol, smoking and stress. Too much of this vitamin can be unsafe. Talk to your care team about how much is right for you. What side effects may I notice from receiving this medication? Side effects that you should report to your care team as soon as possible: Allergic reactions--skin rash, itching, hives, swelling of the face, lips, tongue, or throat This list may not describe all possible side effects. Call your doctor for medical advice about side effects. You may report side effects to FDA at 1-800-FDA-1088. Where should I keep my medication? Keep out of the reach of children. Store at room temperature between 15 and 30 degrees C (59 and 85 degrees F). Protect from heat and light. Throw away any unused medication after the expiration date. NOTE: This sheet is a summary. It may not cover all possible information. If you have questions about this medicine, talk to your doctor, pharmacist, or health care provider.  2023 Elsevier/Gold Standard (2020-10-03 00:00:00)

## 2021-08-20 NOTE — Assessment & Plan Note (Signed)
Well controlled, no changes to meds. Encouraged heart healthy diet such as the DASH diet and exercise as tolerated.  °

## 2021-08-20 NOTE — Assessment & Plan Note (Signed)
Encourage heart healthy diet such as MIND or DASH diet, increase exercise, avoid trans fats, simple carbohydrates and processed foods, consider a krill or fish or flaxseed oil cap daily.  °

## 2021-08-20 NOTE — Assessment & Plan Note (Signed)
Declines Shingrix shots

## 2021-08-20 NOTE — Assessment & Plan Note (Signed)
She is awaiting left hip replacement with Dr Ninfa Linden at [facility]

## 2021-08-20 NOTE — Assessment & Plan Note (Signed)
Follows with psych Dr Adele Schilder

## 2021-08-21 NOTE — Assessment & Plan Note (Signed)
On Levothyroxine, continue to monitor 

## 2021-08-21 NOTE — Assessment & Plan Note (Signed)
Supplement and monitor 

## 2021-08-21 NOTE — Assessment & Plan Note (Signed)
Hydrate and monitor 

## 2021-08-21 NOTE — Assessment & Plan Note (Signed)
She is working with Dr Ninfa Linden of [facility]. She is hoping to move forward with left THR soon.

## 2021-08-26 ENCOUNTER — Encounter: Payer: Self-pay | Admitting: Orthopaedic Surgery

## 2021-08-26 ENCOUNTER — Ambulatory Visit: Payer: Medicare PPO | Admitting: Orthopaedic Surgery

## 2021-08-26 VITALS — Ht 64.5 in | Wt 198.0 lb

## 2021-08-26 DIAGNOSIS — M25552 Pain in left hip: Secondary | ICD-10-CM | POA: Diagnosis not present

## 2021-08-26 DIAGNOSIS — M1612 Unilateral primary osteoarthritis, left hip: Secondary | ICD-10-CM

## 2021-08-26 NOTE — Progress Notes (Signed)
Office Visit Note   Patient: Margaret [name]           Date of Birth: [birth year]           MRN: [id number] Visit Date: 08/26/2021              Requested by: Lyndal Pulley, DO [place],   [id number] PCP: Mosie Lukes, MD   Assessment & Plan: Visit Diagnoses:  1. Pain of left hip   2. Unilateral primary osteoarthritis, left hip     Plan: The patient understands that she has severe end-stage arthritis of her left hip.  We talked in length in detail about hip replacement surgery.  She is interested in having this done.  I talked about the risks and benefits of surgery and what to expect from an intraoperative and postoperative course.  I gave her handout about hip replacement surgery and went over her x-rays with her.  All questions and concerns were answered and addressed.  We will work on getting this scheduled sometime at the earliest of September since she is starting a new job in [name] August.  Follow-Up Instructions: Return for 2 weeks post-op.   Orders:  No orders of the defined types were placed in this encounter.  No orders of the defined types were placed in this encounter.     Procedures: No procedures performed   Clinical Data: No additional findings.   Subjective: Chief Complaint  Patient presents with   Left Hip - Pain  The patient is a very pleasant and active 71 year old referred from Dr. Hulan Saas to evaluate and treat known osteoarthritis of her left hip.  He has been getting worse for about 2 years now.  She has had lateral pain but now groin pain as well on that left side.  She does ambulate with a cane.  She is interested in considering hip replacement surgery.  She does start a new job on August 2 so would need to wait at least and to potentially September.  She is not a diabetic.  She has tried and failed all forms conservative treatment at this point for her left hip.  She is not on blood thinning medications and is otherwise  healthy with no acute medical issues.  HPI  Review of Systems There is no listed  Objective: Vital Signs: Ht 5' 4.5" (1.638 m)   Wt 198 lb (89.8 kg)   BMI 33.46 kg/m   Physical Exam She is alert and orient x3 and in no acute distress Ortho Exam Examination of her left hip shows significant loss of internal and external rotation secondary pain and stiffness.  Her right hip moves more smoothly. Specialty Comments:  No specialty comments available.  Imaging: No results found. X-rays independently reviewed on the canopy system of her pelvis and left hip shows severe end-stage arthritis of the left hip.  There is significant narrowing of the joint space with periarticular osteophytes around the femoral head.  There is also sclerotic and cystic changes consistent with severe osteoarthritis.  PMFS History: Patient Active Problem List   Diagnosis Date Noted   Unilateral primary osteoarthritis, left hip 08/26/2021   Arthritis of left hip 07/30/2021   Educated about COVID-19 virus infection 10/28/2019   Vitamin D deficiency 06/23/2019   Essential hypertension 09/28/2018   Herpes zoster without complication 54/65/0354   Eczema 09/21/2018   Preventative health care 06/09/2016   Obstruction of left ureteropelvic junction (UPJ) due to stone  03/07/2015   Kidney stone 11/05/2014   Alopecia 11/07/2013   Low back pain with radiation 05/31/2013   Urinary problem 05/30/2013   Tinea pedis 03/07/2013   Arthritis 02/14/2013   Renal insufficiency 12/26/2012   Diarrhea 12/26/2012   Other malaise and fatigue 09/19/2012   Hyperglycemia 09/19/2012   Esophageal reflux 09/19/2012   Cervical cancer screening 01/05/2012   Hyperlipidemia 01/05/2012   Hypothyroidism 07/27/2011   Bipolar disorder ([place]) 07/27/2011   Past Medical History:  Diagnosis Date   Anxiety    Arthritis 02/14/2013   Bipolar disorder ([place])    Depression 1961   depression & anxiety all my life   Esophageal reflux  09/19/2012   only slight with gallbladder problems   Essential hypertension 09/28/2018   Hypoglycemia    Hypothyroidism    Other malaise and fatigue 09/19/2012   PONV (postoperative nausea and vomiting)    Preventative health care 06/09/2016   PTSD (post-traumatic stress disorder)    Thyroid disease     Family History  Problem Relation Age of Onset   Diabetes Mother        Brother 80 of 2   Heart failure Mother    Arthritis Mother    Depression Mother    [name] death Mother    Heart disease Mother    Prostate cancer Father    Parkinsonism Father        deceased   Hearing loss Father    Hypertension Brother    ADD / ADHD Brother    Anxiety disorder Brother    Esophageal cancer Maternal Grandmother    Cancer Paternal Grandmother        cancer of jawbone 1 of 2   Melanoma Brother    Alcohol abuse Brother    Anxiety disorder Brother    Diabetes Brother    Hearing loss Brother    Breast cancer Neg Hx    Colon cancer Neg Hx     Past Surgical History:  Procedure Laterality Date   CHOLECYSTECTOMY N/A 09/01/2016   Procedure: LAPAROSCOPIC CHOLECYSTECTOMY WITH INTRAOPERATIVE CHOLANGIOGRAM;  Surgeon: Jovita Kussmaul, MD;  Location: [facility] OR;  Service: General;  Laterality: N/A;   DG 4TH DIGIT LEFT FOOT     EYE SURGERY     when i was a child   fooet surgery Left 2014   reconstruction of foot and ankle to correct deformity   NECK SURGERY     OTHER SURGICAL HISTORY     OTHER SURGICAL HISTORY     OTHER SURGICAL HISTORY     TUBAL LIGATION     reversal   Social History   Occupational History   Not on file  Tobacco Use   Smoking status: Former    Packs/day: 2.00    Years: 10.00    Total pack years: 20.00    Types: Cigarettes    Quit date: 02/10/1993    Years since quitting: 28.5   Smokeless tobacco: Never   Tobacco comments:    will never smoke again  Vaping Use   Vaping Use: Never used  Substance and Sexual Activity   Alcohol use: No   Drug use: No   Sexual activity:  Not Currently    Birth control/protection: Abstinence

## 2021-09-06 DIAGNOSIS — H6121 Impacted cerumen, right ear: Secondary | ICD-10-CM | POA: Diagnosis not present

## 2021-09-20 ENCOUNTER — Other Ambulatory Visit: Payer: Self-pay

## 2021-09-24 ENCOUNTER — Other Ambulatory Visit (HOSPITAL_BASED_OUTPATIENT_CLINIC_OR_DEPARTMENT_OTHER): Payer: Self-pay

## 2021-10-01 ENCOUNTER — Other Ambulatory Visit (HOSPITAL_BASED_OUTPATIENT_CLINIC_OR_DEPARTMENT_OTHER): Payer: Self-pay

## 2021-10-01 ENCOUNTER — Other Ambulatory Visit: Payer: Self-pay | Admitting: Family Medicine

## 2021-10-01 MED ORDER — LEVOTHYROXINE SODIUM 125 MCG PO TABS
125.0000 ug | ORAL_TABLET | Freq: Every day | ORAL | 1 refills | Status: DC
  Filled 2021-10-01: qty 90, 90d supply, fill #0
  Filled 2021-12-29: qty 90, 90d supply, fill #1

## 2021-10-09 NOTE — Patient Instructions (Signed)
SURGICAL WAITING ROOM VISITATION Patients having surgery or a procedure may have no more than 2 support people in the waiting area - these visitors may rotate.   Children under the age of 22 must have an adult with them who is not the patient. If the patient needs to stay at the hospital during part of their recovery, the visitor guidelines for inpatient rooms apply. Pre-op nurse will coordinate an appropriate time for 1 support person to accompany patient in pre-op.  This support person may not rotate.    Please refer to the [facility] website for the visitor guidelines for Inpatients (after your surgery is over and you are in a regular room).       Your procedure is scheduled on:  10/25/2021    Report to [facility] Main Entrance    Report to admitting at   0720AM   Call this number if you have problems the morning of surgery [phone]   Do not eat food :After Midnight.   After Midnight you may have the following liquids until ___  0645___ AM  DAY OF SURGERY  Water Non-Citrus Juices (without pulp, NO RED) Carbonated Beverages Black Coffee (NO MILK/CREAM OR CREAMERS, sugar ok)  Clear Tea (NO MILK/CREAM OR CREAMERS, sugar ok) regular and decaf                             Plain Jell-O (NO RED)                                           Fruit ices (not with fruit pulp, NO RED)                                     Popsicles (NO RED)                                                               Sports drinks like Gatorade (NO RED)                    The day of surgery:  Drink ONE (1) Pre-Surgery Clear Ensure or G2 at AM the morning of surgery. Drink in one sitting. Do not sip.  This drink was given to you during your hospital  pre-op appointment visit. Nothing else to drink after completing the  Pre-Surgery Clear Ensure or G2.          Oral Hygiene is also important to reduce your risk of infection.                                    Remember - BRUSH YOUR TEETH  THE MORNING OF SURGERY WITH YOUR REGULAR TOOTHPASTE   Do NOT smoke after Midnight   Take these medicines the morning of surgery with A SIP OF WATER:  synthroid   DO NOT TAKE ANY ORAL DIABETIC MEDICATIONS DAY OF YOUR SURGERY  Bring CPAP mask and tubing day of surgery.  You may not have any metal on your body including hair pins, jewelry, and body piercing             Do not wear make-up, lotions, powders, perfumes/cologne, or deodorant  Do not wear nail polish including gel and S&S, artificial/acrylic nails, or any other type of covering on natural nails including finger and toenails. If you have artificial nails, gel coating, etc. that needs to be removed by a nail salon please have this removed prior to surgery or surgery may need to be canceled/ delayed if the surgeon/ anesthesia feels like they are unable to be safely monitored.   Do not shave  48 hours prior to surgery.               Men may shave face and neck.   Do not bring valuables to the hospital. [place].   Contacts, dentures or bridgework may not be worn into surgery.   Bring small overnight bag day of surgery.   DO NOT [place]. PHARMACY WILL DISPENSE MEDICATIONS LISTED ON YOUR MEDICATION LIST TO YOU DURING YOUR ADMISSION [place]!    Patients discharged on the day of surgery will not be allowed to drive home.  Someone NEEDS to stay with you for the first 24 hours after anesthesia.   Special Instructions: Bring a copy of your healthcare power of attorney and living will documents         the day of surgery if you haven't scanned them before.              Please read over the following fact sheets you were given: IF YOU HAVE QUESTIONS ABOUT YOUR PRE-OP INSTRUCTIONS PLEASE CALL [phone]     [facility] - Preparing for Surgery Before surgery, you can play an important role.  Because skin is not  sterile, your skin needs to be as free of germs as possible.  You can reduce the number of germs on your skin by washing with CHG (chlorahexidine gluconate) soap before surgery.  CHG is an antiseptic cleaner which kills germs and bonds with the skin to continue killing germs even after washing. Please DO NOT use if you have an allergy to CHG or antibacterial soaps.  If your skin becomes reddened/irritated stop using the CHG and inform your nurse when you arrive at Short Stay. Do not shave (including legs and underarms) for at least 48 hours prior to the first CHG shower.  You may shave your face/neck. Please follow these instructions carefully:  1.  Shower with CHG Soap the night before surgery and the  morning of Surgery.  2.  If you choose to wash your hair, wash your hair first as usual with your  normal  shampoo.  3.  After you shampoo, rinse your hair and body thoroughly to remove the  shampoo.                           4.  Use CHG as you would any other liquid soap.  You can apply chg directly  to the skin and wash                       Gently with a scrungie or clean washcloth.  5.  Apply the CHG Soap to your  body ONLY FROM THE NECK DOWN.   Do not use on face/ open                           Wound or open sores. Avoid contact with eyes, ears mouth and genitals (private parts).                       Wash face,  Genitals (private parts) with your normal soap.             6.  Wash thoroughly, paying special attention to the area where your surgery  will be performed.  7.  Thoroughly rinse your body with warm water from the neck down.  8.  DO NOT shower/wash with your normal soap after using and rinsing off  the CHG Soap.                9.  Pat yourself dry with a clean towel.            10.  Wear clean pajamas.            11.  Place clean sheets on your bed the night of your first shower and do not  sleep with pets. Day of Surgery : Do not apply any lotions/deodorants the morning of surgery.   Please wear clean clothes to the hospital/surgery center.  FAILURE TO FOLLOW THESE INSTRUCTIONS MAY RESULT IN THE CANCELLATION OF YOUR SURGERY PATIENT SIGNATURE_________________________________  NURSE SIGNATURE__________________________________  ________________________________________________________________________

## 2021-10-09 NOTE — Progress Notes (Signed)
Anesthesia Review:  PCP: Penni Homans Lov 7/11/.23  Cardiologist : Chest x-ray : EKG : Echo : Stress test: Cardiac Cath :  Activity level:  Sleep Study/ CPAP : Fasting Blood Sugar :      / Checks Blood Sugar -- times a day:   Blood Thinner/ Instructions /Last Dose: ASA / Instructions/ Last Dose :

## 2021-10-11 ENCOUNTER — Telehealth: Payer: Self-pay

## 2021-10-11 NOTE — Telephone Encounter (Signed)
Patient called and cancelled left THA surgery after watching EMMI video sent to her by the hospital.  She is going to try alternative treatments.

## 2021-10-15 ENCOUNTER — Encounter (HOSPITAL_COMMUNITY)
Admission: RE | Admit: 2021-10-15 | Discharge: 2021-10-15 | Disposition: A | Discharge status: Home / Self Care | Payer: Medicare PPO | Source: Ambulatory Visit | Attending: Anesthesiology | Admitting: Anesthesiology

## 2021-10-15 DIAGNOSIS — Z01818 Encounter for other preprocedural examination: Secondary | ICD-10-CM

## 2021-10-22 ENCOUNTER — Telehealth (HOSPITAL_BASED_OUTPATIENT_CLINIC_OR_DEPARTMENT_OTHER): Payer: Medicare PPO | Admitting: Psychiatry

## 2021-10-22 ENCOUNTER — Encounter (HOSPITAL_COMMUNITY): Payer: Self-pay | Admitting: Psychiatry

## 2021-10-22 ENCOUNTER — Other Ambulatory Visit (HOSPITAL_BASED_OUTPATIENT_CLINIC_OR_DEPARTMENT_OTHER): Payer: Self-pay

## 2021-10-22 DIAGNOSIS — F419 Anxiety disorder, unspecified: Secondary | ICD-10-CM | POA: Diagnosis not present

## 2021-10-22 DIAGNOSIS — F319 Bipolar disorder, unspecified: Secondary | ICD-10-CM | POA: Diagnosis not present

## 2021-10-22 MED ORDER — AMITRIPTYLINE HCL 50 MG PO TABS
ORAL_TABLET | Freq: Every day | ORAL | 0 refills | Status: DC
  Filled 2021-10-22: qty 90, 90d supply, fill #0

## 2021-10-22 MED ORDER — CARBAMAZEPINE ER 200 MG PO CP12
ORAL_CAPSULE | Freq: Two times a day (BID) | ORAL | 2 refills | Status: DC
  Filled 2021-10-22: qty 60, 30d supply, fill #0
  Filled 2021-11-26: qty 60, 30d supply, fill #1
  Filled 2021-12-26: qty 60, 30d supply, fill #2

## 2021-10-22 MED ORDER — RISPERIDONE 2 MG PO TABS
1.0000 mg | ORAL_TABLET | Freq: Every day | ORAL | 0 refills | Status: DC
  Filled 2021-10-22: qty 60, 60d supply, fill #0
  Filled 2021-10-22: qty 30, 30d supply, fill #0

## 2021-10-22 NOTE — Progress Notes (Signed)
Virtual Visit via Video Note  I connected with Margaret [name] on 10/22/21 at  2:40 PM EDT by a video enabled telemedicine application and verified that I am speaking with the correct person using two identifiers.  Location: Patient: Home Provider: Home Office   I discussed the limitations of evaluation and management by telemedicine and the availability of in person appointments. The patient expressed understanding and agreed to proceed.  History of Present Illness: Patient is evaluated by video session.  She is compliant with medication.  She reported her mood is stable and denies any highs and lows, mania, anger.  She sleeps good.  Her last Tegretol level was therapeutic and it is around 7.  She denies any panic attack.  She wants to keep the current medication.  Patient working now full time as a Programmer, applications.  Patient told summer was quite and she did not go anywhere because she wants to take care of her dogs.  She denies any suicidal thoughts or homicidal thoughts and like to keep the current medication.  Her appetite is okay and her weight is unchanged from the past.  Past Psychiatric History:  H/O anxiety, bipolar d/o and inpatient in 2009.  No h/o suicidal attempt.  Lithium worked well but discontinued due to high creatinine.   No results found for this or any previous visit (from the past 2160 hour(s)).    Psychiatric Specialty Exam: Physical Exam  Review of Systems  Weight 198 lb (89.8 kg).There is no height or weight on file to calculate BMI.  General Appearance: Casual  Eye Contact:  Good  Speech:   fast  Volume:  Increased  Mood:  Anxious  Affect:  Appropriate  Thought Process:  Descriptions of Associations: Intact  Orientation:  Full (Time, Place, and Person)  Thought Content:  Logical  Suicidal Thoughts:  No  Homicidal Thoughts:  No  Memory:  Immediate;   Good Recent;   Good Remote;   Good  Judgement:  Good  Insight:  Present  Psychomotor Activity:  Normal   Concentration:  Concentration: Good and Attention Span: Fair  Recall:  Good  Fund of Knowledge:  Good  Language:  Good  Akathisia:  No  Handed:  Right  AIMS (if indicated):     Assets:  Communication Skills Desire for Improvement Housing  ADL's:  Intact  Cognition:  WNL  Sleep:   ok      Assessment and Plan: Bipolar disorder type I.  Anxiety.  I review Tegretol level which is normal.  I recommend she can try reducing the Risperdal from 2 mg to 1 mg however if she noticed symptoms are coming back then she need to go back to 2 mg.  She agreed with the plan.  We will continue amitriptyline 50 mg at bedtime and Tegretol 200 mg 2 times a day.  Recommended to call us back if she has any question or any concern.  Follow-up in 3 months.  Follow Up Instructions:    I discussed the assessment and treatment plan with the patient. The patient was provided an opportunity to ask questions and all were answered. The patient agreed with the plan and demonstrated an understanding of the instructions.   The patient was advised to call back or seek an in-person evaluation if the symptoms worsen or if the condition fails to improve as anticipated.  Collaboration of Care: Other provider involved in patient's care AEB notes are available in epic to review.  Patient/Guardian was  advised Release of Information must be obtained prior to any record release in order to collaborate their care with an outside provider. Patient/Guardian was advised if they have not already done so to contact the registration department to sign all necessary forms in order for [place] to release information regarding their care.   Consent: Patient/Guardian gives verbal consent for treatment and assignment of benefits for services provided during this visit. Patient/Guardian expressed understanding and agreed to proceed.    I provided 17 minutes of non-face-to-face time during this encounter.   [name], MD

## 2021-10-23 ENCOUNTER — Other Ambulatory Visit (HOSPITAL_BASED_OUTPATIENT_CLINIC_OR_DEPARTMENT_OTHER): Payer: Self-pay

## 2021-10-25 ENCOUNTER — Encounter (HOSPITAL_COMMUNITY): Admission: RE | Payer: Self-pay | Source: Home / Self Care

## 2021-10-25 ENCOUNTER — Ambulatory Visit (HOSPITAL_COMMUNITY): Admission: RE | Admit: 2021-10-25 | Payer: Medicare PPO | Source: Home / Self Care | Admitting: Orthopaedic Surgery

## 2021-10-25 SURGERY — ARTHROPLASTY, HIP, TOTAL, ANTERIOR APPROACH
Anesthesia: Spinal | Site: Hip | Laterality: Left

## 2021-10-28 DIAGNOSIS — L602 Onychogryphosis: Secondary | ICD-10-CM | POA: Diagnosis not present

## 2021-10-28 DIAGNOSIS — M79671 Pain in right foot: Secondary | ICD-10-CM | POA: Diagnosis not present

## 2021-10-28 DIAGNOSIS — M79672 Pain in left foot: Secondary | ICD-10-CM | POA: Diagnosis not present

## 2021-11-07 ENCOUNTER — Ambulatory Visit: Payer: Medicare PPO | Admitting: Family Medicine

## 2021-11-07 ENCOUNTER — Encounter: Payer: Medicare PPO | Admitting: Orthopaedic Surgery

## 2021-11-26 ENCOUNTER — Other Ambulatory Visit (HOSPITAL_BASED_OUTPATIENT_CLINIC_OR_DEPARTMENT_OTHER): Payer: Self-pay

## 2021-11-27 ENCOUNTER — Other Ambulatory Visit (INDEPENDENT_AMBULATORY_CARE_PROVIDER_SITE_OTHER): Payer: Medicare PPO

## 2021-11-27 DIAGNOSIS — R5383 Other fatigue: Secondary | ICD-10-CM

## 2021-11-27 DIAGNOSIS — E559 Vitamin D deficiency, unspecified: Secondary | ICD-10-CM

## 2021-11-27 LAB — VITAMIN B12: Vitamin B-12: 281 pg/mL (ref 211–911)

## 2021-11-27 LAB — VITAMIN D 25 HYDROXY (VIT D DEFICIENCY, FRACTURES): VITD: 23.39 ng/mL — ABNORMAL LOW (ref 30.00–100.00)

## 2021-11-28 MED ORDER — VITAMIN D (ERGOCALCIFEROL) 1.25 MG (50000 UNIT) PO CAPS
50000.0000 [IU] | ORAL_CAPSULE | ORAL | 0 refills | Status: DC
  Filled 2021-11-28: qty 8, 56d supply, fill #0

## 2021-11-28 NOTE — Addendum Note (Signed)
Addended by: Anabel Halon on: 11/28/2021 08:04 PM   Modules accepted: Orders

## 2021-11-28 NOTE — Addendum Note (Signed)
Addended by: Anabel Halon on: 11/28/2021 08:03 PM   Modules accepted: Orders

## 2021-11-29 ENCOUNTER — Other Ambulatory Visit (HOSPITAL_BASED_OUTPATIENT_CLINIC_OR_DEPARTMENT_OTHER): Payer: Self-pay

## 2021-12-02 ENCOUNTER — Ambulatory Visit: Payer: Medicare PPO | Admitting: Family

## 2021-12-04 ENCOUNTER — Ambulatory Visit (INDEPENDENT_AMBULATORY_CARE_PROVIDER_SITE_OTHER): Payer: Medicare PPO | Admitting: Family

## 2021-12-04 VITALS — BP 138/78 | HR 83 | Temp 98.0°F | Resp 16 | Wt 203.0 lb

## 2021-12-04 DIAGNOSIS — H6123 Impacted cerumen, bilateral: Secondary | ICD-10-CM

## 2021-12-04 NOTE — Progress Notes (Signed)
Subjective:   By signing my name below, I, Margaret [name], attest that this documentation has been prepared under the direction and in the presence of Margaret Alar, NP. 12/04/2021     Patient ID: Margaret [name], female    DOB: 12-Mar-1950, 71 y.o.   MRN: [id number]  Chief Complaint  Patient presents with  . Ear Fullness    Here for bilateral ear fulness and possible ear lavage     HPI Patient is in today for a office visit.   Earwax- She complains of earwax build up in her ears. Audiologist advised her to have her ears cleaned out before she completes hearing test.   Immunizations- She is not interested in receiving the flu vaccine this year.    Health Maintenance Due  Topic Date Due  . Zoster Vaccines- Shingrix (1 of 2) Never done  . Pneumonia Vaccine 59+ Years old (1 - PCV) Never done  . COLONOSCOPY (Pts 45-56yr Insurance coverage will need to be confirmed)  07/20/2021  . INFLUENZA VACCINE  09/10/2021    Past Medical History:  Diagnosis Date  . Anxiety   . Arthritis 02/14/2013  . Bipolar disorder ([facility]   . Depression 1961   depression & anxiety all my life  . Esophageal reflux 09/19/2012   only slight with gallbladder problems  . Essential hypertension 09/28/2018  . Hypoglycemia   . Hypothyroidism   . Other malaise and fatigue 09/19/2012  . PONV (postoperative nausea and vomiting)   . Preventative health care 06/09/2016  . PTSD (post-traumatic stress disorder)   . Thyroid disease     Past Surgical History:  Procedure Laterality Date  . CHOLECYSTECTOMY N/A 09/01/2016   Procedure: LAPAROSCOPIC CHOLECYSTECTOMY WITH INTRAOPERATIVE CHOLANGIOGRAM;  Surgeon: TJovita Kussmaul MD;  Location[facility]  Service: General;  Laterality: N/A;  . 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 EYE SURGERY     when i was a child  . fooet surgery Left 2014   reconstruction of foot and ankle to correct deformity  . NECK SURGERY    . OTHER SURGICAL HISTORY    . OTHER SURGICAL HISTORY    . OTHER  SURGICAL HISTORY    . TUBAL LIGATION     reversal    Family History  Problem Relation Age of Onset  . Diabetes Mother        Brother 1 of 2  . Heart failure Mother   . Arthritis Mother   . Depression Mother   . Early death Mother   . Heart disease Mother   . Prostate cancer Father   . Parkinsonism Father        deceased  . Hearing loss Father   . Hypertension Brother   . ADD / ADHD Brother   . Anxiety disorder Brother   . Esophageal cancer Maternal Grandmother   . Cancer Paternal Grandmother        cancer of jawbone 1 of 2  . Melanoma Brother   . Alcohol abuse Brother   . Anxiety disorder Brother   . Diabetes Brother   . Hearing loss Brother   . Breast cancer Neg Hx   . Colon cancer Neg Hx     Social History   Socioeconomic History  . Marital status: Divorced    Spouse name: Not on file  . Number of children: Not on file  . Years of education: Not on file  . Highest education level: Not on file  Occupational History  .  Not on file  Tobacco Use  . Smoking status: Former    Packs/day: 2.00    Years: 10.00    Total pack years: 20.00    Types: Cigarettes    Quit date: 02/10/1993    Years since quitting: 28.8  . Smokeless tobacco: Never  . Tobacco comments:    will never smoke again  Vaping Use  . Vaping Use: Never used  Substance and Sexual Activity  . Alcohol use: No  . Drug use: No  . Sexual activity: Not Currently    Birth control/protection: Abstinence  Other Topics Concern  . Not on file  Social History Narrative  . Not on file   Social Determinants of Health   Financial Resource Strain: Low Risk  (02/21/2021)   Overall Financial Resource Strain (CARDIA)   . Difficulty of Paying Living Expenses: Not hard at all  Food Insecurity: No Food Insecurity (02/21/2021)   Hunger Vital Sign   . Worried About Charity fundraiser in the Last Year: Never true   . Ran Out of Food in the Last Year: Never true  Transportation Needs: No Transportation Needs  (02/21/2021)   PRAPARE - Transportation   . Lack of Transportation (Medical): No   . Lack of Transportation (Non-Medical): No  Physical Activity: Inactive (02/21/2021)   Exercise Vital Sign   . Days of Exercise per Week: 0 days   . Minutes of Exercise per Session: 0 min  Stress: No Stress Concern Present (02/21/2021)   [place]   . Feeling of Stress : Not at all  Social Connections: Socially Isolated (02/21/2021)   Social Connection and Isolation Panel [NHANES]   . Frequency of Communication with Friends and Family: Once a week   . Frequency of Social Gatherings with Friends and Family: More than three times a week   . Attends Religious Services: Never   . Active Member of Clubs or Organizations: No   . Attends Archivist Meetings: Never   . Marital Status: Divorced  Human resources officer Violence: Not At Risk (02/21/2021)   Humiliation, Afraid, Rape, and Kick questionnaire   . Fear of Current or Ex-Partner: No   . Emotionally Abused: No   . Physically Abused: No   . Sexually Abused: No    Outpatient Medications Prior to Visit  Medication Sig Dispense Refill  . acetaminophen (TYLENOL) 650 MG CR tablet Take 650 mg by mouth in the morning and at bedtime.    Marland Kitchen amitriptyline (ELAVIL) 50 MG tablet TAKE 1 TABLET BY MOUTH EVERY NIGHT AT BEDTIME 90 tablet 0  . carbamazepine (CARBATROL) 200 MG 12 hr capsule TAKE 1 CAPSULE BY MOUTH TWICE DAILY 60 capsule 2  . levothyroxine (SYNTHROID) 125 MCG tablet Take 1 tablet (125 mcg total) by mouth daily. 90 tablet 1  . risperiDONE (RISPERDAL) 2 MG tablet Take 0.5-1 tablets (1-2 mg total) by mouth at bedtime. 90 tablet 0  . thiamine (VITAMIN B-1) 100 MG tablet Take 100 mg by mouth daily.    . Vitamin D, Ergocalciferol, (DRISDOL) 1.25 MG (50000 UNIT) CAPS capsule Take 1 capsule (50,000 Units total) by mouth every 7 (seven) days. 8 capsule 0   No facility-administered medications prior  to visit.    Allergies  Allergen Reactions  . Codeine Nausea Only  . Darvon Other (See Comments)    Hallucinations   . Fentanyl Anxiety and Palpitations  . Novocain [Procaine] Palpitations and Other (See Comments)  Heart race  . Sulfa Drugs Cross Reactors Nausea And Vomiting    Review of Systems  HENT:         (+)bilateral ear cerumen impaction       Objective:    Physical Exam Constitutional:      General: She is not in acute distress.    Appearance: Normal appearance. She is not ill-appearing.  HENT:     Head: Normocephalic and atraumatic.     Right Ear: External ear normal. There is impacted cerumen.     Left Ear: External ear normal. There is impacted cerumen.  Eyes:     Extraocular Movements: Extraocular movements intact.     Pupils: Pupils are equal, round, and reactive to light.  Cardiovascular:     Rate and Rhythm: Normal rate and regular rhythm.     Heart sounds: Normal heart sounds. No murmur heard.    No gallop.  Pulmonary:     Effort: Pulmonary effort is normal. No respiratory distress.     Breath sounds: Normal breath sounds. No wheezing or rales.  Skin:    General: Skin is warm and dry.  Neurological:     Mental Status: She is alert and oriented to person, place, and time.  Psychiatric:        Judgment: Judgment normal.    BP 138/78   Pulse 83   Temp 98 F (36.7 C) (Oral)   Resp 16   Wt 203 lb (92.1 kg)   SpO2 99%   BMI 34.31 kg/m  Wt Readings from Last 3 Encounters:  12/04/21 203 lb (92.1 kg)  08/26/21 198 lb (89.8 kg)  07/30/21 198 lb (89.8 kg)       Assessment & Plan:   Problem List Items Addressed This Visit       Unprioritized   Bilateral impacted cerumen - Primary    After verbal consent CMA irrigated both ears with successful removal of cerumen plugs.  Normal TM's bilaterally noted.         No orders of the defined types were placed in this encounter.   I, Nance Pear, NP, personally preformed the services  described in this documentation.  All medical record entries made by the scribe were at my direction and in my presence.  I have reviewed the chart and discharge instructions (if applicable) and agree that the record reflects my personal performance and is accurate and complete. 12/04/2021   I,Margaret [name],acting as a Education administrator for Nance Pear, NP.,have documented all relevant documentation on the behalf of Nance Pear, NP,as directed by  Nance Pear, NP while in the presence of Nance Pear, NP.   Nance Pear, NP

## 2021-12-04 NOTE — Assessment & Plan Note (Signed)
After verbal consent CMA irrigated both ears with successful removal of cerumen plugs.  Normal TM's bilaterally noted.

## 2021-12-09 ENCOUNTER — Other Ambulatory Visit (HOSPITAL_BASED_OUTPATIENT_CLINIC_OR_DEPARTMENT_OTHER): Payer: Self-pay

## 2021-12-09 ENCOUNTER — Telehealth (HOSPITAL_COMMUNITY): Payer: Self-pay | Admitting: *Deleted

## 2021-12-09 DIAGNOSIS — F319 Bipolar disorder, unspecified: Secondary | ICD-10-CM

## 2021-12-09 NOTE — Telephone Encounter (Signed)
Pt LVM stating that her pharmacy, [facility], has given her a different generic of Risperdal 2 mg on the last script. Pt stated that she doesn't like it but did not elaborate. Pt did say she would pay for the brand name if needed. Writer spoke with Ailene Ravel at pt pharmacy who said that she has also spoken with pt and advised that pt wants a specific manufacturer that pharmacy does not have in stock currently. Pharmacy says that they don't know if/when they may get that manufacturer or even name brand with all the shortages. Writer LVM for pt asking pt to call back and explain what it is that pt does not like about the brand she currently has and wether or not it is tolerable until pharmacy can get (order) another shipment, or pt may call other pharmacies to ask if they have the manufacturer she prefers. FYI.

## 2021-12-10 NOTE — Telephone Encounter (Signed)
I do not think generic Risperdal cause any efficacy issue however if she cannot tolerate then she need to call other local pharmacy if any of these pharmacy carry brand name.

## 2021-12-11 ENCOUNTER — Other Ambulatory Visit (HOSPITAL_BASED_OUTPATIENT_CLINIC_OR_DEPARTMENT_OTHER): Payer: Self-pay

## 2021-12-11 ENCOUNTER — Other Ambulatory Visit (HOSPITAL_COMMUNITY): Payer: Self-pay | Admitting: Psychiatry

## 2021-12-11 DIAGNOSIS — F319 Bipolar disorder, unspecified: Secondary | ICD-10-CM

## 2021-12-12 ENCOUNTER — Encounter: Payer: Medicare PPO | Admitting: Orthopaedic Surgery

## 2021-12-12 ENCOUNTER — Other Ambulatory Visit (HOSPITAL_BASED_OUTPATIENT_CLINIC_OR_DEPARTMENT_OTHER): Payer: Self-pay

## 2021-12-12 NOTE — Telephone Encounter (Signed)
LVM for pt.

## 2021-12-13 ENCOUNTER — Other Ambulatory Visit (HOSPITAL_BASED_OUTPATIENT_CLINIC_OR_DEPARTMENT_OTHER): Payer: Self-pay

## 2021-12-13 ENCOUNTER — Other Ambulatory Visit: Payer: Self-pay

## 2021-12-13 MED ORDER — RISPERIDONE 2 MG PO TABS
1.0000 mg | ORAL_TABLET | Freq: Every day | ORAL | 0 refills | Status: DC
  Filled 2021-12-13: qty 30, 30d supply, fill #0
  Filled 2021-12-17: qty 90, 90d supply, fill #0

## 2021-12-16 ENCOUNTER — Other Ambulatory Visit (HOSPITAL_COMMUNITY): Payer: Self-pay | Admitting: *Deleted

## 2021-12-17 ENCOUNTER — Other Ambulatory Visit (HOSPITAL_BASED_OUTPATIENT_CLINIC_OR_DEPARTMENT_OTHER): Payer: Self-pay

## 2021-12-23 ENCOUNTER — Telehealth (HOSPITAL_COMMUNITY): Payer: Self-pay

## 2021-12-23 NOTE — Telephone Encounter (Signed)
Medication management - Telephone message left twice this afternoon for patient that this nurse was trying to reach her back after she left a message she wanted to discuss her medications and a possible reaction. Requested pt call back to discuss and to select the nurses line when calling back.

## 2021-12-26 ENCOUNTER — Other Ambulatory Visit (HOSPITAL_COMMUNITY): Payer: Self-pay | Admitting: *Deleted

## 2021-12-26 ENCOUNTER — Other Ambulatory Visit (HOSPITAL_BASED_OUTPATIENT_CLINIC_OR_DEPARTMENT_OTHER): Payer: Self-pay

## 2021-12-26 DIAGNOSIS — F319 Bipolar disorder, unspecified: Secondary | ICD-10-CM

## 2021-12-26 MED ORDER — RISPERIDONE 2 MG PO TABS
1.0000 mg | ORAL_TABLET | Freq: Every day | ORAL | 0 refills | Status: DC
  Filled 2021-12-26 – 2021-12-30 (×3): qty 30, 30d supply, fill #0

## 2021-12-27 ENCOUNTER — Other Ambulatory Visit (HOSPITAL_BASED_OUTPATIENT_CLINIC_OR_DEPARTMENT_OTHER): Payer: Self-pay

## 2021-12-30 ENCOUNTER — Other Ambulatory Visit (HOSPITAL_BASED_OUTPATIENT_CLINIC_OR_DEPARTMENT_OTHER): Payer: Self-pay

## 2022-01-21 ENCOUNTER — Other Ambulatory Visit (HOSPITAL_BASED_OUTPATIENT_CLINIC_OR_DEPARTMENT_OTHER): Payer: Self-pay

## 2022-01-21 ENCOUNTER — Encounter (HOSPITAL_COMMUNITY): Payer: Self-pay

## 2022-01-21 ENCOUNTER — Telehealth (HOSPITAL_COMMUNITY): Payer: Medicare PPO | Admitting: Psychiatry

## 2022-01-21 ENCOUNTER — Telehealth (HOSPITAL_COMMUNITY): Payer: Self-pay

## 2022-01-21 NOTE — Telephone Encounter (Signed)
Patient called stating that she never received a call at her appointment time she is requesting refills for the following medications please advise  Amitriptyline  Carbamazepine Risperidone

## 2022-01-21 NOTE — Telephone Encounter (Signed)
She had a phone block.  It goes directly to the voicemail and I have left to voicemail.  She need to remove the voicemail.  She has appointment coming up next week.

## 2022-01-22 ENCOUNTER — Telehealth (HOSPITAL_BASED_OUTPATIENT_CLINIC_OR_DEPARTMENT_OTHER): Payer: Medicare PPO | Admitting: Psychiatry

## 2022-01-22 ENCOUNTER — Encounter (HOSPITAL_COMMUNITY): Payer: Self-pay | Admitting: Psychiatry

## 2022-01-22 ENCOUNTER — Other Ambulatory Visit (HOSPITAL_BASED_OUTPATIENT_CLINIC_OR_DEPARTMENT_OTHER): Payer: Self-pay

## 2022-01-22 DIAGNOSIS — F319 Bipolar disorder, unspecified: Secondary | ICD-10-CM | POA: Diagnosis not present

## 2022-01-22 DIAGNOSIS — F419 Anxiety disorder, unspecified: Secondary | ICD-10-CM | POA: Diagnosis not present

## 2022-01-22 MED ORDER — CARBAMAZEPINE ER 200 MG PO CP12
ORAL_CAPSULE | Freq: Two times a day (BID) | ORAL | 2 refills | Status: DC
  Filled 2022-01-22: qty 60, 30d supply, fill #0
  Filled 2022-02-23: qty 60, 30d supply, fill #1
  Filled 2022-03-26: qty 60, 30d supply, fill #2

## 2022-01-22 MED ORDER — RISPERIDONE 2 MG PO TABS
2.0000 mg | ORAL_TABLET | Freq: Every day | ORAL | 0 refills | Status: DC
  Filled 2022-01-22 – 2022-01-25 (×4): qty 90, 90d supply, fill #0

## 2022-01-22 MED ORDER — AMITRIPTYLINE HCL 50 MG PO TABS
ORAL_TABLET | Freq: Every day | ORAL | 0 refills | Status: DC
  Filled 2022-01-22: qty 90, 90d supply, fill #0

## 2022-01-22 NOTE — Progress Notes (Signed)
Virtual Visit via Video Note  I connected with Margaret [name] on 01/22/22 at 11:00 AM EST by a video enabled telemedicine application and verified that I am speaking with the correct person using two identifiers.  Location: Patient: In Car Provider: Home Office   I discussed the limitations of evaluation and management by telemedicine and the availability of in person appointments. The patient expressed understanding and agreed to proceed.  History of Present Illness: Patient is evaluated by video session.  She had not cut down the Risperdal because she does not want to reduce the dose as she is doing well and her sleep is good.  Patient told her pharmacy gave her a different manufacture of Risperdal and she did not like and started to have a lot of mood swings.  Finally pharmacy were able to give the same manufacture of Risperdal and she is doing much better.  She is not sure if in the future she will continue to get the same brand of Risperdal as she cannot handle a different manufacture.  She also cannot afford the brand name Risperdal.  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  Now she is considering few hours work if possible that did not hurt her back.  She reported her appetite is okay.  She denies any impulsive behavior, mania, agitation, anger or any crying spells.  She sleeps good.  She lives with her dogs.  Patient reported Thanksgiving was very quiet.  She has 2 brother who lives in [place] but patient has very limited contact with them.  Patient denies any panic attack.  She like to keep the Risperdal, amitriptyline and Tegretol.  Her last level was 7 which was therapeutic.  Patient has no tremor or shakes or any EPS.  Patient admitted having issues in her phone.  We have called few times but going directly into voicemail and patient never received any voicemail.  I recommend should contact with the phone company if they need to fix this  issue.  Past Psychiatric History:  H/O anxiety, bipolar d/o and inpatient in 2009.  No h/o suicidal attempt.  Lithium worked well but discontinued due to high creatinine.    Psychiatric Specialty Exam: Physical Exam  Review of Systems  Weight 203 lb (92.1 kg).There is no height or weight on file to calculate BMI.  General Appearance: Casual  Eye Contact:  Fair  Speech:  Clear and Coherent and fast  Volume:  Normal  Mood:  Anxious  Affect:  Appropriate  Thought Process:  Goal Directed  Orientation:  Full (Time, Place, and Person)  Thought Content:  Logical  Suicidal Thoughts:  No  Homicidal Thoughts:  No  Memory:  Immediate;   Good Recent;   Good Remote;   Good  Judgement:  Fair  Insight:  Present  Psychomotor Activity:  Normal  Concentration:  Concentration: Fair and Attention Span: Fair  Recall:  Good  Fund of Knowledge:  Good  Language:  Good  Akathisia:  No  Handed:  Right  AIMS (if indicated):     Assets:  Communication Skills Desire for Improvement Housing Resilience Talents/Skills Transportation  ADL's:  Intact  Cognition:  WNL  Sleep:   good      Assessment and Plan: Bipolar disorder type I.  Anxiety.  Discussed issues related to manufacturer company that makes the Risperdal.  She is hoping to get the same Risperdal since other Risperdal from a different  manufacturer did not help as much.  I recommend if she continues to get issues then may need to try a different pharmacy.  I also recommend should get the name of the manufacture that suits her.  Patient agreed with the plan.  She will also talk to her phone company advise she is not getting phone calls and voicemail.  Continue Risperdal 2 mg as patient like to keep the current dose, continue amitriptyline 50 mg at bedtime and Tegretol 202 times a day.  Recommended to call us back if is any question of any concern.  Follow-up in 3 months.    Follow Up Instructions:    I discussed the assessment and  treatment plan with the patient. The patient was provided an opportunity to ask questions and all were answered. The patient agreed with the plan and demonstrated an understanding of the instructions.   The patient was advised to call back or seek an in-person evaluation if the symptoms worsen or if the condition fails to improve as anticipated.  Collaboration of Care: Other provider involved in patient's care AEB notes are available in epic to review.  Patient/Guardian was advised Release of Information must be obtained prior to any record release in order to collaborate their care with an outside provider. Patient/Guardian was advised if they have not already done so to contact the registration department to sign all necessary forms in order for [place] to release information regarding their care.   Consent: Patient/Guardian gives verbal consent for treatment and assignment of benefits for services provided during this visit. Patient/Guardian expressed understanding and agreed to proceed.    I provided 20 minutes of non-face-to-face time during this encounter.   [name], MD

## 2022-01-27 ENCOUNTER — Other Ambulatory Visit: Payer: Self-pay

## 2022-01-27 ENCOUNTER — Other Ambulatory Visit (HOSPITAL_BASED_OUTPATIENT_CLINIC_OR_DEPARTMENT_OTHER): Payer: Self-pay

## 2022-01-27 DIAGNOSIS — M79671 Pain in right foot: Secondary | ICD-10-CM | POA: Diagnosis not present

## 2022-01-27 DIAGNOSIS — M79672 Pain in left foot: Secondary | ICD-10-CM | POA: Diagnosis not present

## 2022-01-27 DIAGNOSIS — L602 Onychogryphosis: Secondary | ICD-10-CM | POA: Diagnosis not present

## 2022-01-28 ENCOUNTER — Telehealth (HOSPITAL_COMMUNITY): Payer: Medicare PPO | Admitting: Psychiatry

## 2022-02-24 ENCOUNTER — Other Ambulatory Visit (HOSPITAL_BASED_OUTPATIENT_CLINIC_OR_DEPARTMENT_OTHER): Payer: Self-pay

## 2022-02-25 ENCOUNTER — Ambulatory Visit: Payer: Medicare PPO

## 2022-03-26 ENCOUNTER — Other Ambulatory Visit (HOSPITAL_BASED_OUTPATIENT_CLINIC_OR_DEPARTMENT_OTHER): Payer: Self-pay

## 2022-03-27 ENCOUNTER — Other Ambulatory Visit: Payer: Self-pay | Admitting: Family Medicine

## 2022-03-27 ENCOUNTER — Other Ambulatory Visit (HOSPITAL_BASED_OUTPATIENT_CLINIC_OR_DEPARTMENT_OTHER): Payer: Self-pay

## 2022-03-27 MED ORDER — LEVOTHYROXINE SODIUM 125 MCG PO TABS
125.0000 ug | ORAL_TABLET | Freq: Every day | ORAL | 0 refills | Status: DC
  Filled 2022-03-27: qty 30, 30d supply, fill #0

## 2022-04-08 ENCOUNTER — Other Ambulatory Visit (HOSPITAL_BASED_OUTPATIENT_CLINIC_OR_DEPARTMENT_OTHER): Payer: Self-pay

## 2022-04-16 ENCOUNTER — Ambulatory Visit (INDEPENDENT_AMBULATORY_CARE_PROVIDER_SITE_OTHER): Payer: Medicare PPO | Admitting: *Deleted

## 2022-04-16 DIAGNOSIS — Z0000 Encounter for general adult medical examination without abnormal findings: Secondary | ICD-10-CM | POA: Diagnosis not present

## 2022-04-16 NOTE — Progress Notes (Signed)
Subjective:   CANEI [name] is a 72 y.o. female who presents for Medicare Annual (Subsequent) preventive examination.  I connected with  Margaret [name] on 04/16/22 by a audio enabled telemedicine application and verified that I am speaking with the correct person using two identifiers.  Patient Location: Home  Provider Location: Office/Clinic  I discussed the limitations of evaluation and management by telemedicine. The patient expressed understanding and agreed to proceed.   Review of Systems     Cardiac Risk Factors include: advanced age (>69mn, >>74women);dyslipidemia;hypertension     Objective:    There were no vitals filed for this visit. There is no height or weight on file to calculate BMI.     04/16/2022    3:42 PM 02/21/2021    3:48 PM 08/26/2016    3:00 PM 08/09/2016    7:53 AM  Advanced Directives  Does Patient Have a Medical Advance Directive? Yes Yes Yes Yes  Type of [place] will  Living will  Does patient want to make changes to medical advance directive? No - Patient declined     Copy of [place]? Yes - validated most recent copy scanned in chart (See row information) No - copy requested      Current Medications (verified) Outpatient Encounter Medications as of 04/16/2022  Medication Sig   acetaminophen (TYLENOL) 650 MG CR tablet Take 650 mg by mouth in the morning and at bedtime.   amitriptyline (ELAVIL) 50 MG tablet TAKE 1 TABLET BY MOUTH EVERY NIGHT AT BEDTIME   carbamazepine (CARBATROL) 200 MG 12 hr capsule TAKE 1 CAPSULE BY MOUTH TWICE DAILY   levothyroxine (SYNTHROID) 125 MCG tablet Take 1 tablet (125 mcg total) by mouth daily before breakfast.   risperiDONE (RISPERDAL) 2 MG tablet Take 1 tablet (2 mg total) by mouth at bedtime.   [DISCONTINUED] thiamine (VITAMIN B-1) 100 MG tablet Take 100 mg by mouth daily.   [DISCONTINUED] Vitamin D,  Ergocalciferol, (DRISDOL) 1.25 MG (50000 UNIT) CAPS capsule Take 1 capsule (50,000 Units total) by mouth every 7 (seven) days.   No facility-administered encounter medications on file as of 04/16/2022.    Allergies (verified) Codeine, Darvon, Fentanyl, Novocain [procaine], and Sulfa drugs cross reactors   History: Past Medical History:  Diagnosis Date   Anxiety    Arthritis 02/14/2013   Bipolar disorder ([place]    Depression 1961   depression & anxiety all my life   Esophageal reflux 09/19/2012   only slight with gallbladder problems   Essential hypertension 09/28/2018   Hypoglycemia    Hypothyroidism    Other malaise and fatigue 09/19/2012   PONV (postoperative nausea and vomiting)    Preventative health care 06/09/2016   Preventative health care 06/09/2016   PTSD (post-traumatic stress disorder)    Renal insufficiency 12/26/2012   Thyroid disease    Past Surgical History:  Procedure Laterality Date   CHOLECYSTECTOMY N/A 09/01/2016   Procedure: LAPAROSCOPIC CHOLECYSTECTOMY WITH INTRAOPERATIVE CHOLANGIOGRAM;  Surgeon: TJovita Kussmaul MD;  [facility]  Service: General;  Laterality: N/A;   DG 4TH DIGIT LEFT FOOT     EYE SURGERY     when i was a child   fooet surgery Left 2014   reconstruction of foot and ankle to correct deformity   NECK SURGERY     OTHER SURGICAL HISTORY     OTHER SURGICAL HISTORY     OTHER SURGICAL HISTORY  TUBAL LIGATION     reversal   Family History  Problem Relation Age of Onset   Diabetes Mother        Brother 31 of 2   Heart failure Mother    Arthritis Mother    Depression Mother    Early death Mother    Heart disease Mother    Prostate cancer Father    Parkinsonism Father        deceased   Hearing loss Father    Hypertension Brother    ADD / ADHD Brother    Anxiety disorder Brother    Esophageal cancer Maternal Grandmother    Cancer Paternal Grandmother        cancer of jawbone 1 of 2   Melanoma Brother    Alcohol abuse  Brother    Anxiety disorder Brother    Diabetes Brother    Hearing loss Brother    Breast cancer Neg Hx    Colon cancer Neg Hx    Social History   Socioeconomic History   Marital status: Divorced    Spouse name: Not on file   Number of children: Not on file   Years of education: Not on file   Highest education level: Not on file  Occupational History   Not on file  Tobacco Use   Smoking status: Former    Packs/day: 2.00    Years: 10.00    Total pack years: 20.00    Types: Cigarettes    Quit date: 02/10/1993    Years since quitting: 29.1   Smokeless tobacco: Never   Tobacco comments:    will never smoke again  Vaping Use   Vaping Use: Never used  Substance and Sexual Activity   Alcohol use: No   Drug use: No   Sexual activity: Not Currently    Birth control/protection: Abstinence  Other Topics Concern   Not on file  Social History Narrative   Not on file   Social Determinants of Health   Financial Resource Strain: Low Risk  (02/21/2021)   Overall Financial Resource Strain (CARDIA)    Difficulty of Paying Living Expenses: Not hard at all  Food Insecurity: No Food Insecurity (04/16/2022)   Hunger Vital Sign    Worried About Running Out of Food in the Last Year: Never true    Ran Out of Food in the Last Year: Never true  Transportation Needs: No Transportation Needs (04/16/2022)   PRAPARE - Hydrologist (Medical): No    Lack of Transportation (Non-Medical): No  Physical Activity: Inactive (02/21/2021)   Exercise Vital Sign    Days of Exercise per Week: 0 days    Minutes of Exercise per Session: 0 min  Stress: No Stress Concern Present (02/21/2021)   [place]    Feeling of Stress : Not at all  Social Connections: Socially Isolated (02/21/2021)   Social Connection and Isolation Panel [NHANES]    Frequency of Communication with Friends and Family: Once a week    Frequency of  Social Gatherings with Friends and Family: More than three times a week    Attends Religious Services: Never    Marine scientist or Organizations: No    Attends Archivist Meetings: Never    Marital Status: Divorced    Tobacco Counseling Counseling given: Not Answered Tobacco comments: will never smoke again   Clinical Intake:  Pre-visit preparation completed: Yes  Pain :  No/denies pain  Diabetes: No  How often do you need to have someone help you when you read instructions, pamphlets, or other written materials from your doctor or pharmacy?: 1 - Never   Activities of Daily Living    04/16/2022    3:45 PM  In your present state of health, do you have any difficulty performing the following activities:  Hearing? 1  Comment wears hearing aids  Vision? 1  Comment has cataract on right eye  Difficulty concentrating or making decisions? 0  Walking or climbing stairs? 1  Dressing or bathing? 0  Doing errands, shopping? 0  Preparing Food and eating ? N  Using the Toilet? N  In the past six months, have you accidently leaked urine? Y  Do you have problems with loss of bowel control? Y  Managing your Medications? N  Managing your Finances? N  Housekeeping or managing your Housekeeping? N    Patient Care Team: Mosie Lukes, MD as PCP - General (Family Medicine)  Indicate any recent Medical Services you may have received from other than [facility] providers in the past year (date may be approximate).     Assessment:   This is a routine wellness examination for [place].  Hearing/Vision screen No results found.  Dietary issues and exercise activities discussed: Current Exercise Habits: The patient does not participate in regular exercise at present, Exercise limited by: orthopedic condition(s)   Goals Addressed   None    Depression Screen    04/16/2022    3:45 PM 08/20/2021    2:30 PM 02/21/2021    4:00 PM 09/21/2018    9:36 AM 06/09/2016    2:06 PM  06/09/2016    2:05 PM 02/01/2015    2:13 PM  PHQ 2/9 Scores  PHQ - 2 Score 0 1 1 0 0 0 1  PHQ- 9 Score  1         Fall Risk    04/16/2022    3:42 PM 08/20/2021    2:30 PM 02/21/2021    3:51 PM 10/27/2019    8:33 AM 06/09/2016    2:06 PM  Fall Risk   Falls in the past year? 0 1 1  No  Number falls in past yr: 0 1 0 0   Injury with Fall? 0 1 0 0   Risk for fall due to : No Fall Risks History of fall(s)     Follow up Falls evaluation completed Falls evaluation completed Falls prevention discussed      FALL RISK PREVENTION PERTAINING TO THE HOME:  Any stairs in or around the home? No  Home free of loose throw rugs in walkways, pet beds, electrical cords, etc? Yes  Adequate lighting in your home to reduce risk of falls? Yes   ASSISTIVE DEVICES UTILIZED TO PREVENT FALLS:  Life alert? No  Use of a cane, walker or w/c? No  Grab bars in the bathroom? Yes  Shower chair or bench in shower? No  Elevated toilet seat or a handicapped toilet? No   TIMED UP AND GO:  Was the test performed?  No, audio visit .    Cognitive Function:        04/16/2022    3:48 PM  6CIT Screen  What Year? 0 points  What month? 0 points  What time? 0 points  Count back from 20 0 points  Months in reverse 0 points  Repeat phrase 0 points  Total Score 0 points    Immunizations  Immunization History  Administered Date(s) Administered   Influenza Split 11/11/2011   Influenza-Unspecified 12/13/2012   PPD Test 11/11/2011, 01/11/2021   Td 02/11/2008   Tdap 09/21/2018    TDAP status: Up to date  Flu Vaccine status: Declined, Education has been provided regarding the importance of this vaccine but patient still declined. Advised may receive this vaccine at local pharmacy or Health Dept. Aware to provide a copy of the vaccination record if obtained from local pharmacy or Health Dept. Verbalized acceptance and understanding.  Pneumococcal vaccine status: Due, Education has been provided regarding the  importance of this vaccine. Advised may receive this vaccine at local pharmacy or Health Dept. Aware to provide a copy of the vaccination record if obtained from local pharmacy or Health Dept. Verbalized acceptance and understanding.  Covid-19 vaccine status: Declined, Education has been provided regarding the importance of this vaccine but patient still declined. Advised may receive this vaccine at local pharmacy or Health Dept.or vaccine clinic. Aware to provide a copy of the vaccination record if obtained from local pharmacy or Health Dept. Verbalized acceptance and understanding.  Qualifies for Shingles Vaccine? Yes   Zostavax completed No   Shingrix Completed?: No.    Education has been provided regarding the importance of this vaccine. Patient has been advised to call insurance company to determine out of pocket expense if they have not yet received this vaccine. Advised may also receive vaccine at local pharmacy or Health Dept. Verbalized acceptance and understanding.  Screening Tests Health Maintenance  Topic Date Due   Zoster Vaccines- Shingrix (1 of 2) Never done   Pneumonia Vaccine 47+ Years old (1 of 1 - PCV) Never done   COLONOSCOPY (Pts 45-63yr Insurance coverage will need to be confirmed)  07/20/2021   Medicare Annual Wellness (AWV)  02/21/2022   INFLUENZA VACCINE  05/11/2022 (Originally 09/10/2021)   MAMMOGRAM  04/23/2023   DTaP/Tdap/Td (3 - Td or Tdap) 09/20/2028   DEXA SCAN  Completed   Hepatitis C Screening  Completed   HPV VACCINES  Aged Out   COVID-19 Vaccine  Discontinued    Health Maintenance  Health Maintenance Due  Topic Date Due   Zoster Vaccines- Shingrix (1 of 2) Never done   Pneumonia Vaccine 72 Years old (1 of 1 - PCV) Never done   COLONOSCOPY (Pts 45-473yrInsurance coverage will need to be confirmed)  07/20/2021   Medicare Annual Wellness (AWV)  02/21/2022    Colorectal cancer screening: Type of screening: Colonoscopy. Completed 07/20/21. Repeat every  10 years. Pt will schedule  Mammogram status: Completed 04/22/21. Repeat every year  Bone Density status: Completed 04/25/21. Results reflect: Bone density results: OSTEOPENIA. Repeat every 2 years.  Lung Cancer Screening: (Low Dose CT Chest recommended if Age 72-80ears, 30 pack-year currently smoking OR have quit w/in 15years.) does not qualify.   Additional Screening:  Hepatitis C Screening: does qualify; Completed 06/09/16  Vision Screening: Recommended annual ophthalmology exams for early detection of glaucoma and other disorders of the eye. Is the patient up to date with their annual eye exam?  Yes  Who is the provider or what is the name of the office in which the patient attends annual eye exams? [facility] If pt is not established with a provider, would they like to be referred to a provider to establish care? No .   Dental Screening: Recommended annual dental exams for proper oral hygiene  Community Resource Referral / Chronic Care Management: CRR required this visit?  No  CCM required this visit?  No      Plan:     I have personally reviewed and noted the following in the patient's chart:   Medical and social history Use of alcohol, tobacco or illicit drugs  Current medications and supplements including opioid prescriptions. Patient is not currently taking opioid prescriptions. Functional ability and status Nutritional status Physical activity Advanced directives List of other physicians Hospitalizations, surgeries, and ER visits in previous 12 months Vitals Screenings to include cognitive, depression, and falls Referrals and appointments  In addition, I have reviewed and discussed with patient certain preventive protocols, quality metrics, and best practice recommendations. A written personalized care plan for preventive services as well as general preventive health recommendations were provided to patient.   Due to this being a telephonic visit, the after  visit summary with patients personalized plan was offered to patient via mail or my-chart. Patient would like to access on my-chart.  Beatris Ship, [place]   04/16/2022   Nurse Notes: None

## 2022-04-16 NOTE — Patient Instructions (Signed)
Margaret [name] , Thank you for taking time to come for your Medicare Wellness Visit. I appreciate your ongoing commitment to your health goals. Please review the following plan we discussed and let me know if I can assist you in the future.     This is a list of the screening recommended for you and due dates:  Health Maintenance  Topic Date Due   Zoster (Shingles) Vaccine (1 of 2) Never done   Pneumonia Vaccine (1 of 1 - PCV) Never done   Colon Cancer Screening  07/20/2021   Flu Shot  05/11/2022*   Medicare Annual Wellness Visit  04/16/2023   Mammogram  04/23/2023   DTaP/Tdap/Td vaccine (3 - Td or Tdap) 09/20/2028   DEXA scan (bone density measurement)  Completed   Hepatitis C Screening: USPSTF Recommendation to screen - Ages 53-79 yo.  Completed   HPV Vaccine  Aged Out   COVID-19 Vaccine  Discontinued  *Topic was postponed. The date shown is not the original due date.    Next appointment: Follow up in one year for your annual wellness visit.   Preventive Care 22 Years and Older, Female Preventive care refers to lifestyle choices and visits with your health care provider that can promote health and wellness. What does preventive care include? A yearly physical exam. This is also called an annual well check. Dental exams once or twice a year. Routine eye exams. Ask your health care provider how often you should have your eyes checked. Personal lifestyle choices, including: Daily care of your teeth and gums. Regular physical activity. Eating a healthy diet. Avoiding tobacco and drug use. Limiting alcohol use. Practicing safe sex. Taking low-dose aspirin every day. Taking vitamin and mineral supplements as recommended by your health care provider. What happens during an annual well check? The services and screenings done by your health care provider during your annual well check will depend on your age, overall health, lifestyle risk factors, and family history of  disease. Counseling  Your health care provider may ask you questions about your: Alcohol use. Tobacco use. Drug use. Emotional well-being. Home and relationship well-being. Sexual activity. Eating habits. History of falls. Memory and ability to understand (cognition). Work and work Statistician. Reproductive health. Screening  You may have the following tests or measurements: Height, weight, and BMI. Blood pressure. Lipid and cholesterol levels. These may be checked every 5 years, or more frequently if you are over 2 years old. Skin check. Lung cancer screening. You may have this screening every year starting at age 33 if you have a 30-pack-year history of smoking and currently smoke or have quit within the past 15 years. Fecal occult blood test (FOBT) of the stool. You may have this test every year starting at age 46. Flexible sigmoidoscopy or colonoscopy. You may have a sigmoidoscopy every 5 years or a colonoscopy every 10 years starting at age 9. Hepatitis C blood test. Hepatitis B blood test. Sexually transmitted disease (STD) testing. Diabetes screening. This is done by checking your blood sugar (glucose) after you have not eaten for a while (fasting). You may have this done every 1-3 years. Bone density scan. This is done to screen for osteoporosis. You may have this done starting at age 67. Mammogram. This may be done every 1-2 years. Talk to your health care provider about how often you should have regular mammograms. Talk with your health care provider about your test results, treatment options, and if necessary, the need for more tests. Vaccines  Your health care provider may recommend certain vaccines, such as: Influenza vaccine. This is recommended every year. Tetanus, diphtheria, and acellular pertussis (Tdap, Td) vaccine. You may need a Td booster every 10 years. Zoster vaccine. You may need this after age 44. Pneumococcal 13-valent conjugate (PCV13) vaccine. One  dose is recommended after age 88. Pneumococcal polysaccharide (PPSV23) vaccine. One dose is recommended after age 17. Talk to your health care provider about which screenings and vaccines you need and how often you need them. This information is not intended to replace advice given to you by your health care provider. Make sure you discuss any questions you have with your health care provider. Document Released: 02/23/2015 Document Revised: 10/17/2015 Document Reviewed: 11/28/2014 Elsevier Interactive Patient Education  2017 [place] Prevention in the Home Falls can cause injuries. They can happen to people of all ages. There are many things you can do to make your home safe and to help prevent falls. What can I do on the outside of my home? Regularly fix the edges of walkways and driveways and fix any cracks. Remove anything that might make you trip as you walk through a door, such as a raised step or threshold. Trim any bushes or trees on the path to your home. Use bright outdoor lighting. Clear any walking paths of anything that might make someone trip, such as rocks or tools. Regularly check to see if handrails are loose or broken. Make sure that both sides of any steps have handrails. Any raised decks and porches should have guardrails on the edges. Have any leaves, snow, or ice cleared regularly. Use sand or salt on walking paths during winter. Clean up any spills in your garage right away. This includes oil or grease spills. What can I do in the bathroom? Use night lights. Install grab bars by the toilet and in the tub and shower. Do not use towel bars as grab bars. Use non-skid mats or decals in the tub or shower. If you need to sit down in the shower, use a plastic, non-slip stool. Keep the floor dry. Clean up any water that spills on the floor as soon as it happens. Remove soap buildup in the tub or shower regularly. Attach bath mats securely with double-sided  non-slip rug tape. Do not have throw rugs and other things on the floor that can make you trip. What can I do in the bedroom? Use night lights. Make sure that you have a light by your bed that is easy to reach. Do not use any sheets or blankets that are too big for your bed. They should not hang down onto the floor. Have a firm chair that has side arms. You can use this for support while you get dressed. Do not have throw rugs and other things on the floor that can make you trip. What can I do in the kitchen? Clean up any spills right away. Avoid walking on wet floors. Keep items that you use a lot in easy-to-reach places. If you need to reach something above you, use a strong step stool that has a grab bar. Keep electrical cords out of the way. Do not use floor polish or wax that makes floors slippery. If you must use wax, use non-skid floor wax. Do not have throw rugs and other things on the floor that can make you trip. What can I do with my stairs? Do not leave any items on the stairs. Make sure that there are  handrails on both sides of the stairs and use them. Fix handrails that are broken or loose. Make sure that handrails are as long as the stairways. Check any carpeting to make sure that it is firmly attached to the stairs. Fix any carpet that is loose or worn. Avoid having throw rugs at the top or bottom of the stairs. If you do have throw rugs, attach them to the floor with carpet tape. Make sure that you have a light switch at the top of the stairs and the bottom of the stairs. If you do not have them, ask someone to add them for you. What else can I do to help prevent falls? Wear shoes that: Do not have high heels. Have rubber bottoms. Are comfortable and fit you well. Are closed at the toe. Do not wear sandals. If you use a stepladder: Make sure that it is fully opened. Do not climb a closed stepladder. Make sure that both sides of the stepladder are locked into place. Ask  someone to hold it for you, if possible. Clearly mark and make sure that you can see: Any grab bars or handrails. First and last steps. Where the edge of each step is. Use tools that help you move around (mobility aids) if they are needed. These include: Canes. Walkers. Scooters. Crutches. Turn on the lights when you go into a dark area. Replace any light bulbs as soon as they burn out. Set up your furniture so you have a clear path. Avoid moving your furniture around. If any of your floors are uneven, fix them. If there are any pets around you, be aware of where they are. Review your medicines with your doctor. Some medicines can make you feel dizzy. This can increase your chance of falling. Ask your doctor what other things that you can do to help prevent falls. This information is not intended to replace advice given to you by your health care provider. Make sure you discuss any questions you have with your health care provider. Document Released: 11/23/2008 Document Revised: 07/05/2015 Document Reviewed: 03/03/2014 Elsevier Interactive Patient Education  2017 [employer].

## 2022-04-22 ENCOUNTER — Encounter (HOSPITAL_COMMUNITY): Payer: Self-pay | Admitting: Psychiatry

## 2022-04-22 ENCOUNTER — Telehealth (HOSPITAL_BASED_OUTPATIENT_CLINIC_OR_DEPARTMENT_OTHER): Payer: Medicare PPO | Admitting: Psychiatry

## 2022-04-22 DIAGNOSIS — F319 Bipolar disorder, unspecified: Secondary | ICD-10-CM | POA: Diagnosis not present

## 2022-04-22 DIAGNOSIS — F419 Anxiety disorder, unspecified: Secondary | ICD-10-CM

## 2022-04-22 MED ORDER — RISPERIDONE 2 MG PO TABS: 2.0000 mg | ORAL_TABLET | Freq: Every day | ORAL | 0 refills | Status: DC

## 2022-04-22 MED ORDER — CARBAMAZEPINE ER 200 MG PO CP12: ORAL_CAPSULE | Freq: Two times a day (BID) | ORAL | 2 refills | Status: DC

## 2022-04-22 MED ORDER — AMITRIPTYLINE HCL 50 MG PO TABS: ORAL_TABLET | Freq: Every day | ORAL | 0 refills | Status: DC

## 2022-04-22 NOTE — Progress Notes (Signed)
[facility] MD Virtual Progress Note   Patient Location: In Car Provider Location: Home Office  I connect with patient by video and verified that I am speaking with correct person by using two identifiers. I discussed the limitations of evaluation and management by telemedicine and the availability of in person appointments. I also discussed with the patient that there may be a patient responsible charge related to this service. The patient expressed understanding and agreed to proceed.  Margaret [name] [id number] 71 y.o.  04/22/2022 2:45 PM  History of Present Illness:  Patient is evaluated by video session.  She reported a lot of financial stress and she is about to lose everything because not been back to work since August due to back pain.  Finally she started working yesterday at [employer] 10 hours a day for next 3 days.  She is not sure how long she will work because her back is hurting but needs some money.  She is hoping after 3 days she may get some rest.  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 first.  She still struggle with the pharmacy and not able to get the particular brand of risperidone.  She also find out that her Carbatrol is also expensive and she has to pay Synthroid more money than she is supposed to.  She now like to change her pharmacy.  She feels her otherwise mood is stable she denies any mania, anger, hallucination, suicidal thoughts.  She reported her holidays were quite.  She did not talk or visit her brother who live close by but she has cut off the communication with them.  She denies any recent mania or impulsive behavior.  She sleeps okay other than her back pain.  She denies any panic attack.  Her last Tegretol level was 6.7 which was done in June.  She has appointment coming up with Dr. Erline Levine applied in 3 months.  She has no tremors, shakes or any EPS.  She admitted increased weight because of sedentary  lifestyle but hoping since she started working able to lose some weight.  Despite increased price she like to keep the current medication because she feels her mood stable on the current medication.  Past Psychiatric History: H/O anxiety, bipolar d/o and inpatient in 2009.  No h/o suicidal attempt.  Lithium worked well but discontinued due to high creatinine.     Outpatient Encounter Medications as of 04/22/2022  Medication Sig   acetaminophen (TYLENOL) 650 MG CR tablet Take 650 mg by mouth in the morning and at bedtime.   amitriptyline (ELAVIL) 50 MG tablet TAKE 1 TABLET BY MOUTH EVERY NIGHT AT BEDTIME   carbamazepine (CARBATROL) 200 MG 12 hr capsule TAKE 1 CAPSULE BY MOUTH TWICE DAILY   levothyroxine (SYNTHROID) 125 MCG tablet Take 1 tablet (125 mcg total) by mouth daily before breakfast.   risperiDONE (RISPERDAL) 2 MG tablet Take 1 tablet (2 mg total) by mouth at bedtime.   No facility-administered encounter medications on file as of 04/22/2022.    No results found for this or any previous visit (from the past 2160 hour(s)).   Psychiatric Specialty Exam: Physical Exam  Review of Systems  Musculoskeletal:  Positive for back pain.    Weight 210 lb (95.3 kg).There is no height or weight on file to calculate BMI.  General Appearance: Casual  Eye Contact:  Fair  Speech:   fast  Volume:  Increased  Mood:  Anxious and Dysphoric  Affect:  Constricted  Thought Process:  Goal Directed  Orientation:  Full (Time, Place, and Person)  Thought Content:  Rumination  Suicidal Thoughts:  No  Homicidal Thoughts:  No  Memory:  Immediate;   Good Recent;   Fair Remote;   Fair  Judgement:  Intact  Insight:  Present  Psychomotor Activity:  Increased  Concentration:  Concentration: Fair and Attention Span: Fair  Recall:  Good  Fund of Knowledge:  Good  Language:  Good  Akathisia:  No  Handed:  Right  AIMS (if indicated):     Assets:  Communication Skills Desire for  Improvement Housing Transportation  ADL's:  Intact  Cognition:  WNL  Sleep:  fair     Assessment/Plan: Anxiety - Plan: amitriptyline (ELAVIL) 50 MG tablet  Bipolar 1 disorder (HCC) - Plan: amitriptyline (ELAVIL) 50 MG tablet, risperiDONE (RISPERDAL) 2 MG tablet, carbamazepine (CARBATROL) 200 MG 12 hr capsule  Discussed issues with the pharmacy and not getting particular brand of risperidone.  Patient like to try a different pharmacy.  I agree with the plan.  She does not want to change the medication since it is working well.  She is hoping to be able to move help some weight loss.  She started working [employer] since yesterday.  Continue risperidone 2 mg at bedtime, amitriptyline 50 mg at bedtime and Tegretol 200 mg twice a day.  Her last level was 6.7 which was done in June.  Discussed medication side effects and benefits.  Recommended to call us back if she has any question or any concern.  Follow-up in 3 months.   Follow Up Instructions:     I discussed the assessment and treatment plan with the patient. The patient was provided an opportunity to ask questions and all were answered. The patient agreed with the plan and demonstrated an understanding of the instructions.   The patient was advised to call back or seek an in-person evaluation if the symptoms worsen or if the condition fails to improve as anticipated.    Collaboration of Care: Other provider involved in patient's care AEB notes are available in epic to review.  Patient/Guardian was advised Release of Information must be obtained prior to any record release in order to collaborate their care with an outside provider. Patient/Guardian was advised if they have not already done so to contact the registration department to sign all necessary forms in order for [place] to release information regarding their care.   Consent: Patient/Guardian gives verbal consent for treatment and assignment of benefits for services provided during  this visit. Patient/Guardian expressed understanding and agreed to proceed.     I provided 20 minutes of non face to face time during this encounter.  [name], MD 04/22/2022

## 2022-04-25 ENCOUNTER — Encounter: Payer: Self-pay | Admitting: Family Medicine

## 2022-04-25 ENCOUNTER — Other Ambulatory Visit: Payer: Self-pay | Admitting: Family Medicine

## 2022-04-25 ENCOUNTER — Ambulatory Visit (INDEPENDENT_AMBULATORY_CARE_PROVIDER_SITE_OTHER): Payer: Medicare PPO | Admitting: Family Medicine

## 2022-04-25 VITALS — BP 150/77 | HR 82 | Temp 98.2°F | Resp 18 | Ht 67.0 in | Wt 207.2 lb

## 2022-04-25 DIAGNOSIS — E039 Hypothyroidism, unspecified: Secondary | ICD-10-CM | POA: Diagnosis not present

## 2022-04-25 DIAGNOSIS — M545 Low back pain, unspecified: Secondary | ICD-10-CM | POA: Diagnosis not present

## 2022-04-25 DIAGNOSIS — G8929 Other chronic pain: Secondary | ICD-10-CM

## 2022-04-25 LAB — TSH: TSH: 0.37 u[IU]/mL (ref 0.35–5.50)

## 2022-04-25 MED ORDER — LEVOTHYROXINE SODIUM 125 MCG PO TABS: 125.0000 ug | ORAL_TABLET | Freq: Every day | ORAL | 0 refills | Status: DC

## 2022-04-25 NOTE — Patient Instructions (Signed)
Synthroid refilled. Labs today. PT referral for back pain. Home stretches. Heat. Massage. Etc.

## 2022-04-25 NOTE — Progress Notes (Signed)
   Acute Office Visit  Subjective:     Patient ID: Margaret [name], female    DOB: 10/13/1950, 72 y.o.   MRN: [id number]  Chief Complaint  Patient presents with   med follow up    Concerns/ questions: none    HPI Patient is in today for thyroid medication refill.   Patient is here because she needs a Synthroid refill. No symptoms.  Her arthritis in her back has been bothering her again. She manages with OTC analgesics and resting. No new symptoms.     ROS All review of systems negative except what is listed in the HPI      Objective:    BP (!) 150/77   Pulse 82   Temp 98.2 F (36.8 C) (Oral)   Resp 18   Ht 5\' 7"  (1.702 m)   Wt 207 lb 3.2 oz (94 kg)   SpO2 100%   BMI 32.45 kg/m    Physical Exam Vitals reviewed.  Constitutional:      Appearance: Normal appearance. She is obese.  Cardiovascular:     Rate and Rhythm: Normal rate and regular rhythm.     Pulses: Normal pulses.     Heart sounds: Normal heart sounds.  Pulmonary:     Effort: Pulmonary effort is normal.     Breath sounds: Normal breath sounds.  Skin:    General: Skin is warm and dry.  Neurological:     Mental Status: She is alert and oriented to person, place, and time.  Psychiatric:        Mood and Affect: Mood normal.        Behavior: Behavior normal.        Thought Content: Thought content normal.        Judgment: Judgment normal.     No results found for any visits on 04/25/22.      Assessment & Plan:   Problem List Items Addressed This Visit       Endocrine   Hypothyroidism - Primary Asymptomatic. Synthroid refilled. Labs today.    Relevant Medications   levothyroxine (SYNTHROID) 125 MCG tablet   Other Relevant Orders   TSH   Other Visit Diagnoses     Chronic midline low back pain without sciatica     PT referral for back pain. Home stretches. Heat. Massage. Etc.    Relevant Orders   Ambulatory referral to Physical Therapy      BP is elevated in office today. Reports  home readings are much better (white coat syndrome), plus she has had a stressful day - her friend passed this morning, she was running late to appointment, back pain is flaring up, etc. She will keep an eye on it at home and let us know if continuing to run high.    Meds ordered this encounter  Medications   levothyroxine (SYNTHROID) 125 MCG tablet    Sig: Take 1 tablet (125 mcg total) by mouth daily before breakfast.    Dispense:  30 tablet    Refill:  0    Requested drug refills are authorized, however, the patient needs further evaluation and/or laboratory testing before further refills are given. Ask her to make an appointment for this.    Order Specific Question:   Supervising Provider    Answer:   Mosie Lukes [id number]    Return for routine follow-up PCP 2-3 months .  Terrilyn Saver, NP

## 2022-04-28 DIAGNOSIS — M79672 Pain in left foot: Secondary | ICD-10-CM | POA: Diagnosis not present

## 2022-04-28 DIAGNOSIS — M79671 Pain in right foot: Secondary | ICD-10-CM | POA: Diagnosis not present

## 2022-04-28 DIAGNOSIS — L602 Onychogryphosis: Secondary | ICD-10-CM | POA: Diagnosis not present

## 2022-07-19 ENCOUNTER — Other Ambulatory Visit (HOSPITAL_COMMUNITY): Payer: Self-pay | Admitting: Psychiatry

## 2022-07-19 DIAGNOSIS — F319 Bipolar disorder, unspecified: Secondary | ICD-10-CM

## 2022-07-19 DIAGNOSIS — F419 Anxiety disorder, unspecified: Secondary | ICD-10-CM

## 2022-07-22 ENCOUNTER — Telehealth (HOSPITAL_BASED_OUTPATIENT_CLINIC_OR_DEPARTMENT_OTHER): Payer: Medicare HMO | Admitting: Psychiatry

## 2022-07-22 ENCOUNTER — Encounter (HOSPITAL_COMMUNITY): Payer: Self-pay | Admitting: Psychiatry

## 2022-07-22 VITALS — Wt 207.0 lb

## 2022-07-22 DIAGNOSIS — F319 Bipolar disorder, unspecified: Secondary | ICD-10-CM | POA: Diagnosis not present

## 2022-07-22 DIAGNOSIS — F419 Anxiety disorder, unspecified: Secondary | ICD-10-CM

## 2022-07-22 MED ORDER — CARBAMAZEPINE ER 200 MG PO CP12: ORAL_CAPSULE | Freq: Two times a day (BID) | ORAL | 2 refills | Status: DC

## 2022-07-22 MED ORDER — RISPERIDONE 2 MG PO TABS
2.0000 mg | ORAL_TABLET | Freq: Every day | ORAL | 0 refills | Status: DC
Start: 2022-07-22 — End: 2022-10-21

## 2022-07-22 MED ORDER — AMITRIPTYLINE HCL 50 MG PO TABS: ORAL_TABLET | Freq: Every day | ORAL | 0 refills | Status: DC

## 2022-07-22 NOTE — Progress Notes (Signed)
[facility] MD Virtual Progress Note   Patient Location: Home Provider Location: Office  I connect with patient by video and verified that I am speaking with correct person by using two identifiers. I discussed the limitations of evaluation and management by telemedicine and the availability of in person appointments. I also discussed with the patient that there may be a patient responsible charge related to this service. The patient expressed understanding and agreed to proceed.  Margaret [name] [id number] 72 y.o.  07/22/2022 2:04 PM  History of Present Illness:  Patient is evaluated by video session.  She is doing fine but reported not able to work because of the back pain.  She also have difficulty walking and some time not sleeping well because of the pain.  She is taking Tylenol on a regular basis.  She denies any irritability, mania, anger, suicidal thoughts.  She admitted some stress because not able to work as she had worked all her life in the past.  She is trying to work from home job but so far not able to find one.  She also do not have a car and usually she uses [address] transportation for groceries and doctor's appointment.  She has appointment coming up with her PCP Dr. Reuel Derby on July 22.  She is going to have a physical and blood work.  I recommend should do the Tegretol level as patient cannot come to our office for blood work.  Overall she feels her mania, impulsive behavior is under control.  She like to keep the current medication which is working well for her.  Her last Tegretol level was 6.7 which was done in June 2023.  She has no tremors, shakes or any EPS.  She denies any panic attack.  Her appetite is okay.  Her weight is stable.  Past Psychiatric History: H/O anxiety, bipolar d/o and inpatient in 2009.  No h/o suicidal attempt.  Lithium worked well but discontinued due to high creatinine.      Outpatient Encounter Medications as of 07/22/2022   Medication Sig   acetaminophen (TYLENOL) 650 MG CR tablet Take 650 mg by mouth in the morning and at bedtime.   amitriptyline (ELAVIL) 50 MG tablet TAKE 1 TABLET BY MOUTH EVERY NIGHT AT BEDTIME   carbamazepine (CARBATROL) 200 MG 12 hr capsule TAKE 1 CAPSULE BY MOUTH TWICE DAILY   levothyroxine (SYNTHROID) 125 MCG tablet TAKE 1 TABLET(125 MCG) BY MOUTH DAILY BEFORE BREAKFAST   risperiDONE (RISPERDAL) 2 MG tablet Take 1 tablet (2 mg total) by mouth at bedtime.   No facility-administered encounter medications on file as of 07/22/2022.    Recent Results (from the past 2160 hour(s))  TSH     Status: None   Collection Time: 04/25/22  1:04 PM  Result Value Ref Range   TSH 0.37 0.35 - 5.50 uIU/mL     Psychiatric Specialty Exam: Physical Exam  Review of Systems  Musculoskeletal:  Positive for back pain.    Weight 207 lb (93.9 kg).There is no height or weight on file to calculate BMI.  General Appearance: Casual  Eye Contact:  Fair  Speech:  Normal Rate  Volume:  Normal  Mood:  Euthymic  Affect:  Appropriate  Thought Process:  Goal Directed  Orientation:  Full (Time, Place, and Person)  Thought Content:  Logical  Suicidal Thoughts:  No  Homicidal Thoughts:  No  Memory:  Immediate;   Good Recent;   Fair Remote;   Fair  Judgement:  Intact  Insight:  Present  Psychomotor Activity:  Increased  Concentration:  Concentration: Fair and Attention Span: Fair  Recall:  Good  Fund of Knowledge:  Good  Language:  Good  Akathisia:  No  Handed:  Right  AIMS (if indicated):     Assets:  Communication Skills Desire for Improvement Housing Resilience Transportation  ADL's:  Intact  Cognition:  WNL  Sleep:  fair, sometimes not good because of pain     Assessment/Plan: Bipolar 1 disorder (HCC) - Plan: amitriptyline (ELAVIL) 50 MG tablet, carbamazepine (CARBATROL) 200 MG 12 hr capsule, risperiDONE (RISPERDAL) 2 MG tablet  Anxiety - Plan: amitriptyline (ELAVIL) 50 MG tablet  Patient  is stable on her current medication.  Emphasized to had Tegretol level on her next appointment with primary care.  Patient acknowledged and agreed.  We will also send a message to her PCP.  Continue risperidone 2 mg at bedtime, amitriptyline 50 mg at bedtime and Tegretol 200 mg twice a day.  She has no major concerns or side effects of the medication.  Recommended to call us back if he has any question or any concern.  Follow-up in 3 months.   Follow Up Instructions:     I discussed the assessment and treatment plan with the patient. The patient was provided an opportunity to ask questions and all were answered. The patient agreed with the plan and demonstrated an understanding of the instructions.   The patient was advised to call back or seek an in-person evaluation if the symptoms worsen or if the condition fails to improve as anticipated.    Collaboration of Care: Other provider involved in patient's care AEB notes are available in epic to review.  Patient/Guardian was advised Release of Information must be obtained prior to any record release in order to collaborate their care with an outside provider. Patient/Guardian was advised if they have not already done so to contact the registration department to sign all necessary forms in order for [place] to release information regarding their care.   Consent: Patient/Guardian gives verbal consent for treatment and assignment of benefits for services provided during this visit. Patient/Guardian expressed understanding and agreed to proceed.     I provided 19 minutes of non face to face time during this encounter.  Note: This document was prepared by [employer] voice dictation technology and any errors that results from this process are unintentional.    Cleotis Nipper, MD 07/22/2022

## 2022-07-24 ENCOUNTER — Other Ambulatory Visit: Payer: Self-pay | Admitting: Family Medicine

## 2022-07-24 DIAGNOSIS — E039 Hypothyroidism, unspecified: Secondary | ICD-10-CM

## 2022-07-28 DIAGNOSIS — L602 Onychogryphosis: Secondary | ICD-10-CM | POA: Diagnosis not present

## 2022-07-28 DIAGNOSIS — L84 Corns and callosities: Secondary | ICD-10-CM | POA: Diagnosis not present

## 2022-07-28 DIAGNOSIS — M79672 Pain in left foot: Secondary | ICD-10-CM | POA: Diagnosis not present

## 2022-07-28 DIAGNOSIS — M79671 Pain in right foot: Secondary | ICD-10-CM | POA: Diagnosis not present

## 2022-08-31 NOTE — Assessment & Plan Note (Signed)
Hydrate and monitor 

## 2022-08-31 NOTE — Progress Notes (Unsigned)
Subjective:    Patient ID: Margaret [name], female    DOB: 06-01-1950, 72 y.o.   MRN: [id number]  No chief complaint on file.   HPI Discussed the use of AI scribe software for clinical note transcription with the patient, who gave verbal consent to proceed.  History of Present Illness         Patient 72 yo female in today for follow up on chronic medical concerns. No recent febrile illness or hospitalizations. Denies CP/palp/SOB/HA/congestion/fevers/GI or GU c/o. Taking meds as prescribed    Past Medical History:  Diagnosis Date  . Anxiety   . Arthritis 02/14/2013  . Bipolar disorder (HCC)   . Depression 1961   depression & anxiety all my life  . Esophageal reflux 09/19/2012   only slight with gallbladder problems  . Essential hypertension 09/28/2018  . Hypoglycemia   . Hypothyroidism   . Other malaise and fatigue 09/19/2012  . PONV (postoperative nausea and vomiting)   . Preventative health care 06/09/2016  . Preventative health care 06/09/2016  . PTSD (post-traumatic stress disorder)   . Renal insufficiency 12/26/2012  . Thyroid disease     Past Surgical History:  Procedure Laterality Date  . CHOLECYSTECTOMY N/A 09/01/2016   Procedure: LAPAROSCOPIC CHOLECYSTECTOMY WITH INTRAOPERATIVE CHOLANGIOGRAM;  Surgeon: Griselda Miner, MD;  Location: [facility] OR;  Service: General;  Laterality: N/A;  . 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 EYE SURGERY     when i was a child  . fooet surgery Left 2014   reconstruction of foot and ankle to correct deformity  . NECK SURGERY    . OTHER SURGICAL HISTORY    . OTHER SURGICAL HISTORY    . OTHER SURGICAL HISTORY    . TUBAL LIGATION     reversal    Family History  Problem Relation Age of Onset  . Diabetes Mother        Brother 1 of 2  . Heart failure Mother   . Arthritis Mother   . Depression Mother   . Early death Mother   . Heart disease Mother   . Prostate cancer Father   . Parkinsonism Father        deceased  . Hearing loss Father   .  Hypertension Brother   . ADD / ADHD Brother   . Anxiety disorder Brother   . Esophageal cancer Maternal Grandmother   . Cancer Paternal Grandmother        cancer of jawbone 1 of 2  . Melanoma Brother   . Alcohol abuse Brother   . Anxiety disorder Brother   . Diabetes Brother   . Hearing loss Brother   . Breast cancer Neg Hx   . Colon cancer Neg Hx     Social History   Socioeconomic History  . Marital status: Divorced    Spouse name: Not on file  . Number of children: Not on file  . Years of education: Not on file  . Highest education level: Not on file  Occupational History  . Not on file  Tobacco Use  . Smoking status: Former    Current packs/day: 0.00    Average packs/day: 2.0 packs/day for 10.0 years (20.0 ttl pk-yrs)    Types: Cigarettes    Start date: 02/11/1983    Quit date: 02/10/1993    Years since quitting: 29.5  . Smokeless tobacco: Never  . Tobacco comments:    will never smoke again  Vaping Use  . Vaping  status: Never Used  Substance and Sexual Activity  . Alcohol use: No  . Drug use: No  . Sexual activity: Not Currently    Birth control/protection: Abstinence  Other Topics Concern  . Not on file  Social History Narrative  . Not on file   Social Determinants of Health   Financial Resource Strain: Low Risk  (02/21/2021)   Overall Financial Resource Strain (CARDIA)   . Difficulty of Paying Living Expenses: Not hard at all  Food Insecurity: No Food Insecurity (04/16/2022)   Hunger Vital Sign   . Worried About Programme researcher, broadcasting/film/video in the Last Year: Never true   . Ran Out of Food in the Last Year: Never true  Transportation Needs: No Transportation Needs (04/16/2022)   PRAPARE - Transportation   . Lack of Transportation (Medical): No   . Lack of Transportation (Non-Medical): No  Physical Activity: Inactive (02/21/2021)   Exercise Vital Sign   . Days of Exercise per Week: 0 days   . Minutes of Exercise per Session: 0 min  Stress: No Stress Concern Present  (02/21/2021)   [employer] of Occupational Health - Occupational Stress Questionnaire   . Feeling of Stress : Not at all  Social Connections: Unknown (03/10/2022)   [facility]   Social Network   . Social Network: Not on file  Intimate Partner Violence: Not At Risk (04/16/2022)   Humiliation, Afraid, Rape, and Kick questionnaire   . Fear of Current or Ex-Partner: No   . Emotionally Abused: No   . Physically Abused: No   . Sexually Abused: No    Outpatient Medications Prior to Visit  Medication Sig Dispense Refill  . acetaminophen (TYLENOL) 650 MG CR tablet Take 650 mg by mouth in the morning and at bedtime.    Marland Kitchen amitriptyline (ELAVIL) 50 MG tablet TAKE 1 TABLET BY MOUTH EVERY NIGHT AT BEDTIME 90 tablet 0  . carbamazepine (CARBATROL) 200 MG 12 hr capsule TAKE 1 CAPSULE BY MOUTH TWICE DAILY 60 capsule 2  . levothyroxine (SYNTHROID) 125 MCG tablet TAKE 1 TABLET(125 MCG) BY MOUTH DAILY BEFORE BREAKFAST 90 tablet 0  . risperiDONE (RISPERDAL) 2 MG tablet Take 1 tablet (2 mg total) by mouth at bedtime. 90 tablet 0   No facility-administered medications prior to visit.    Allergies  Allergen Reactions  . Codeine Nausea Only  . Darvon Other (See Comments)    Hallucinations   . Fentanyl Anxiety and Palpitations  . Novocain [Procaine] Palpitations and Other (See Comments)    Heart race  . Sulfa Drugs Cross Reactors Nausea And Vomiting    Review of Systems  Constitutional:  Negative for fever and malaise/fatigue.  HENT:  Negative for congestion.   Eyes:  Negative for blurred vision.  Respiratory:  Negative for shortness of breath.   Cardiovascular:  Negative for chest pain, palpitations and leg swelling.  Gastrointestinal:  Negative for abdominal pain, blood in stool and nausea.  Genitourinary:  Negative for dysuria and frequency.  Musculoskeletal:  Negative for falls.  Skin:  Negative for rash.  Neurological:  Negative for dizziness, loss of consciousness and  headaches.  Endo/Heme/Allergies:  Negative for environmental allergies.  Psychiatric/Behavioral:  Negative for depression. The patient is not nervous/anxious.       Objective:    Physical Exam Constitutional:      General: She is not in acute distress.    Appearance: Normal appearance. She is well-developed. She is not toxic-appearing.  HENT:  Head: Normocephalic and atraumatic.     Right Ear: External ear normal.     Left Ear: External ear normal.     Nose: Nose normal.  Eyes:     General:        Right eye: No discharge.        Left eye: No discharge.     Conjunctiva/sclera: Conjunctivae normal.  Neck:     Thyroid: No thyromegaly.  Cardiovascular:     Rate and Rhythm: Normal rate and regular rhythm.     Heart sounds: Normal heart sounds. No murmur heard. Pulmonary:     Effort: Pulmonary effort is normal. No respiratory distress.     Breath sounds: Normal breath sounds.  Abdominal:     General: Bowel sounds are normal.     Palpations: Abdomen is soft.     Tenderness: There is no abdominal tenderness. There is no guarding.  Musculoskeletal:        General: Normal range of motion.     Cervical back: Neck supple.  Lymphadenopathy:     Cervical: No cervical adenopathy.  Skin:    General: Skin is warm and dry.  Neurological:     Mental Status: She is alert and oriented to person, place, and time.  Psychiatric:        Mood and Affect: Mood normal.        Behavior: Behavior normal.        Thought Content: Thought content normal.        Judgment: Judgment normal.   There were no vitals taken for this visit. Wt Readings from Last 3 Encounters:  04/25/22 207 lb 3.2 oz (94 kg)  12/04/21 203 lb (92.1 kg)  08/26/21 198 lb (89.8 kg)    Diabetic Foot Exam - Simple   No data filed    Lab Results  Component Value Date   WBC 7.1 07/02/2021   HGB 14.7 07/02/2021   HCT 43.5 07/02/2021   PLT 232.0 07/02/2021   GLUCOSE 85 07/02/2021   CHOL 264 (H) 10/27/2019   TRIG 258  (H) 10/27/2019   HDL 51 10/27/2019   LDLDIRECT 131.0 03/24/2019   LDLCALC 171 (H) 10/27/2019   ALT 9 07/02/2021   AST 11 07/02/2021   NA 141 07/02/2021   K 4.1 07/02/2021   CL 106 07/02/2021   CREATININE 0.93 07/02/2021   BUN 18 07/02/2021   CO2 27 07/02/2021   TSH 0.37 04/25/2022   HGBA1C 6.0 (H) 10/27/2019    Lab Results  Component Value Date   TSH 0.37 04/25/2022   Lab Results  Component Value Date   WBC 7.1 07/02/2021   HGB 14.7 07/02/2021   HCT 43.5 07/02/2021   MCV 96.2 07/02/2021   PLT 232.0 07/02/2021   Lab Results  Component Value Date   NA 141 07/02/2021   K 4.1 07/02/2021   CO2 27 07/02/2021   GLUCOSE 85 07/02/2021   BUN 18 07/02/2021   CREATININE 0.93 07/02/2021   BILITOT 0.3 07/02/2021   ALKPHOS 91 07/02/2021   AST 11 07/02/2021   ALT 9 07/02/2021   PROT 6.5 07/02/2021   ALBUMIN 4.4 07/02/2021   CALCIUM 9.7 07/02/2021   ANIONGAP 4 (L) 08/26/2016   GFR 62.15 07/02/2021   Lab Results  Component Value Date   CHOL 264 (H) 10/27/2019   Lab Results  Component Value Date   HDL 51 10/27/2019   Lab Results  Component Value Date   LDLCALC 171 (H) 10/27/2019   Lab  Results  Component Value Date   TRIG 258 (H) 10/27/2019   Lab Results  Component Value Date   CHOLHDL 5.2 (H) 10/27/2019   Lab Results  Component Value Date   HGBA1C 6.0 (H) 10/27/2019       Assessment & Plan:  Hyperglycemia Assessment & Plan: hgba1c acceptable, minimize simple carbs. Increase exercise as tolerated.    Renal insufficiency Assessment & Plan: Hydrate and monitor   Hyperlipidemia, unspecified hyperlipidemia type Assessment & Plan: Encourage heart healthy diet such as MIND or DASH diet, increase exercise, avoid trans fats, simple carbohydrates and processed foods, consider a krill or fish or flaxseed oil cap daily.    Hypothyroidism, unspecified type Assessment & Plan: On Levothyroxine, continue to monitor   Bipolar affective disorder, currently  manic, mild (HCC) Assessment & Plan: Table on current meds   Vitamin D deficiency Assessment & Plan: Supplement and monitor   Essential hypertension Assessment & Plan: Well controlled, no changes to meds. Encouraged heart healthy diet such as the DASH diet and exercise as tolerated.      Assessment and Plan              Danise Edge, MD

## 2022-08-31 NOTE — Assessment & Plan Note (Signed)
On Levothyroxine, continue to monitor 

## 2022-08-31 NOTE — Assessment & Plan Note (Signed)
hgba1c acceptable, minimize simple carbs. Increase exercise as tolerated.  

## 2022-08-31 NOTE — Assessment & Plan Note (Signed)
Encourage heart healthy diet such as MIND or DASH diet, increase exercise, avoid trans fats, simple carbohydrates and processed foods, consider a krill or fish or flaxseed oil cap daily.  °

## 2022-08-31 NOTE — Assessment & Plan Note (Signed)
Supplement and monitor 

## 2022-08-31 NOTE — Assessment & Plan Note (Signed)
Table on current meds

## 2022-08-31 NOTE — Assessment & Plan Note (Signed)
Well controlled, no changes to meds. Encouraged heart healthy diet such as the DASH diet and exercise as tolerated.  °

## 2022-09-01 ENCOUNTER — Ambulatory Visit (INDEPENDENT_AMBULATORY_CARE_PROVIDER_SITE_OTHER): Payer: Medicare HMO | Admitting: Family Medicine

## 2022-09-01 VITALS — BP 124/78 | HR 86 | Temp 98.0°F | Resp 16 | Ht 64.0 in | Wt 204.6 lb

## 2022-09-01 DIAGNOSIS — Z1231 Encounter for screening mammogram for malignant neoplasm of breast: Secondary | ICD-10-CM | POA: Diagnosis not present

## 2022-09-01 DIAGNOSIS — R739 Hyperglycemia, unspecified: Secondary | ICD-10-CM | POA: Diagnosis not present

## 2022-09-01 DIAGNOSIS — Z124 Encounter for screening for malignant neoplasm of cervix: Secondary | ICD-10-CM

## 2022-09-01 DIAGNOSIS — Z0000 Encounter for general adult medical examination without abnormal findings: Secondary | ICD-10-CM

## 2022-09-01 DIAGNOSIS — E785 Hyperlipidemia, unspecified: Secondary | ICD-10-CM

## 2022-09-01 DIAGNOSIS — E039 Hypothyroidism, unspecified: Secondary | ICD-10-CM | POA: Diagnosis not present

## 2022-09-01 DIAGNOSIS — N289 Disorder of kidney and ureter, unspecified: Secondary | ICD-10-CM

## 2022-09-01 DIAGNOSIS — I10 Essential (primary) hypertension: Secondary | ICD-10-CM

## 2022-09-01 DIAGNOSIS — M8580 Other specified disorders of bone density and structure, unspecified site: Secondary | ICD-10-CM

## 2022-09-01 DIAGNOSIS — E559 Vitamin D deficiency, unspecified: Secondary | ICD-10-CM | POA: Diagnosis not present

## 2022-09-01 DIAGNOSIS — Z79899 Other long term (current) drug therapy: Secondary | ICD-10-CM

## 2022-09-01 DIAGNOSIS — F3111 Bipolar disorder, current episode manic without psychotic features, mild: Secondary | ICD-10-CM | POA: Diagnosis not present

## 2022-09-01 NOTE — Assessment & Plan Note (Signed)
She feels the exam is too painful and wants to stop paps

## 2022-09-01 NOTE — Patient Instructions (Addendum)
Vitamin D 3, 2000 international units  daily eventive Care 65 Years and Older, Female Preventive care refers to lifestyle choices and visits with your health care provider that can promote health and wellness. Preventive care visits are also called wellness exams. What can I expect for my preventive care visit? Counseling Your health care provider may ask you questions about your: Medical history, including: Past medical problems. Family medical history. Pregnancy and menstrual history. History of falls. Current health, including: Memory and ability to understand (cognition). Emotional well-being. Home life and relationship well-being. Sexual activity and sexual health. Lifestyle, including: Alcohol, nicotine or tobacco, and drug use. Access to firearms. Diet, exercise, and sleep habits. Work and work Astronomer. Sunscreen use. Safety issues such as seatbelt and bike helmet use. Physical exam Your health care provider will check your: Height and weight. These may be used to calculate your BMI (body mass index). BMI is a measurement that tells if you are at a healthy weight. Waist circumference. This measures the distance around your waistline. This measurement also tells if you are at a healthy weight and may help predict your risk of certain diseases, such as type 2 diabetes and high blood pressure. Heart rate and blood pressure. Body temperature. Skin for abnormal spots. What immunizations do I need?  Vaccines are usually given at various ages, according to a schedule. Your health care provider will recommend vaccines for you based on your age, medical history, and lifestyle or other factors, such as travel or where you work. What tests do I need? Screening Your health care provider may recommend screening tests for certain conditions. This may include: Lipid and cholesterol levels. Hepatitis C test. Hepatitis B test. HIV (human immunodeficiency virus) test. STI (sexually  transmitted infection) testing, if you are at risk. Lung cancer screening. Colorectal cancer screening. Diabetes screening. This is done by checking your blood sugar (glucose) after you have not eaten for a while (fasting). Mammogram. Talk with your health care provider about how often you should have regular mammograms. BRCA-related cancer screening. This may be done if you have a family history of breast, ovarian, tubal, or peritoneal cancers. Bone density scan. This is done to screen for osteoporosis. Talk with your health care provider about your test results, treatment options, and if necessary, the need for more tests. Follow these instructions at home: Eating and drinking  Eat a diet that includes fresh fruits and vegetables, whole grains, lean protein, and low-fat dairy products. Limit your intake of foods with high amounts of sugar, saturated fats, and salt. Take vitamin and mineral supplements as recommended by your health care provider. Do not drink alcohol if your health care provider tells you not to drink. If you drink alcohol: Limit how much you have to 0-1 drink a day. Know how much alcohol is in your drink. In the [place] one drink equals one 12 oz bottle of beer (355 mL), one 5 oz glass of wine (148 mL), or one 1 oz glass of hard liquor (44 mL). Lifestyle Brush your teeth every morning and night with fluoride toothpaste. Floss one time each day. Exercise for at least 30 minutes 5 or more days each week. Do not use any products that contain nicotine or tobacco. These products include cigarettes, chewing tobacco, and vaping devices, such as e-cigarettes. If you need help quitting, ask your health care provider. Do not use drugs. If you are sexually active, practice safe sex. Use a condom or other form of protection in order to  prevent STIs. Take aspirin only as told by your health care provider. Make sure that you understand how much to take and what form to take. Work with your  health care provider to find out whether it is safe and beneficial for you to take aspirin daily. Ask your health care provider if you need to take a cholesterol-lowering medicine (statin). Find healthy ways to manage stress, such as: Meditation, yoga, or listening to music. Journaling. Talking to a trusted person. Spending time with friends and family. Minimize exposure to UV radiation to reduce your risk of skin cancer. Safety Always wear your seat belt while driving or riding in a vehicle. Do not drive: If you have been drinking alcohol. Do not ride with someone who has been drinking. When you are tired or distracted. While texting. If you have been using any mind-altering substances or drugs. Wear a helmet and other protective equipment during sports activities. If you have firearms in your house, make sure you follow all gun safety procedures. What's next? Visit your health care provider once a year for an annual wellness visit. Ask your health care provider how often you should have your eyes and teeth checked. Stay up to date on all vaccines. This information is not intended to replace advice given to you by your health care provider. Make sure you discuss any questions you have with your health care provider. Document Revised: 07/25/2020 Document Reviewed: 07/25/2020 Elsevier Patient Education  2024 [employer].

## 2022-09-01 NOTE — Assessment & Plan Note (Signed)
Bone density shows osteopenia, which is thinner than normal but not as bad as osteoporosis. Recommend calcium intake of 1200 to 1500 mg daily, divided into roughly 3 doses. Best source is the diet and a single dairy serving is about 500 mg, a supplement of calcium citrate once or twice daily to balance diet is fine if not getting enough in diet. Also need Vitamin D 2000 IU caps, 1 cap daily if not already taking vitamin D. Also recommend weight baring exercise on hips and upper body to keep bones strong  

## 2022-09-01 NOTE — Assessment & Plan Note (Signed)
Patient encouraged to maintain heart healthy diet, regular exercise, adequate sleep. Consider daily probiotics. Take medications as prescribed. Labs ordered and reviewed. MGM and Dexa in March of 2023. Repeat mgm in 1-2 years. Dexa in 2-5 years. Declines all colonoscopy and further paps due to pain

## 2022-09-02 ENCOUNTER — Other Ambulatory Visit: Payer: Self-pay

## 2022-09-02 LAB — CBC WITH DIFFERENTIAL/PLATELET
Basophils Absolute: 0.1 10*3/uL (ref 0.0–0.1)
Basophils Relative: 1 % (ref 0.0–3.0)
Eosinophils Absolute: 0.2 10*3/uL (ref 0.0–0.7)
Eosinophils Relative: 2.9 % (ref 0.0–5.0)
HCT: 45.5 % (ref 36.0–46.0)
Hemoglobin: 15.3 g/dL — ABNORMAL HIGH (ref 12.0–15.0)
Lymphocytes Relative: 19.1 % (ref 12.0–46.0)
Lymphs Abs: 1.4 10*3/uL (ref 0.7–4.0)
MCHC: 33.6 g/dL (ref 30.0–36.0)
MCV: 98 fl (ref 78.0–100.0)
Monocytes Absolute: 0.6 10*3/uL (ref 0.1–1.0)
Monocytes Relative: 9 % (ref 3.0–12.0)
Neutro Abs: 4.9 10*3/uL (ref 1.4–7.7)
Neutrophils Relative %: 68 % (ref 43.0–77.0)
Platelets: 225 10*3/uL (ref 150.0–400.0)
RBC: 4.65 Mil/uL (ref 3.87–5.11)
RDW: 12.8 % (ref 11.5–15.5)
WBC: 7.2 10*3/uL (ref 4.0–10.5)

## 2022-09-02 LAB — LIPID PANEL
Cholesterol: 251 mg/dL — ABNORMAL HIGH (ref 0–200)
HDL: 47.3 mg/dL (ref >39.00)
NonHDL: 203.99
Total CHOL/HDL Ratio: 5
Triglycerides: 224 mg/dL — ABNORMAL HIGH (ref 0.0–149.0)
VLDL: 44.8 mg/dL — ABNORMAL HIGH (ref 0.0–40.0)

## 2022-09-02 LAB — COMPREHENSIVE METABOLIC PANEL
ALT: 13 U/L (ref 0–35)
AST: 13 U/L (ref 0–37)
Albumin: 4.5 g/dL (ref 3.5–5.2)
Alkaline Phosphatase: 96 U/L (ref 39–117)
BUN: 25 mg/dL — ABNORMAL HIGH (ref 6–23)
CO2: 26 mEq/L (ref 19–32)
Calcium: 9.8 mg/dL (ref 8.4–10.5)
Chloride: 105 mEq/L (ref 96–112)
Creatinine, Ser: 1.1 mg/dL (ref 0.40–1.20)
GFR: 50.39 mL/min — ABNORMAL LOW (ref >60.00)
Glucose, Bld: 95 mg/dL (ref 70–99)
Potassium: 4.5 mEq/L (ref 3.5–5.1)
Sodium: 140 mEq/L (ref 135–145)
Total Bilirubin: 0.4 mg/dL (ref 0.2–1.2)
Total Protein: 6.8 g/dL (ref 6.0–8.3)

## 2022-09-02 LAB — CARBAMAZEPINE LEVEL, TOTAL: Carbamazepine Lvl: 7.4 mg/L (ref 4.0–12.0)

## 2022-09-02 LAB — TSH: TSH: 0.14 u[IU]/mL — ABNORMAL LOW (ref 0.35–5.50)

## 2022-09-02 LAB — VITAMIN D 25 HYDROXY (VIT D DEFICIENCY, FRACTURES): VITD: 19.12 ng/mL — ABNORMAL LOW (ref 30.00–100.00)

## 2022-09-02 LAB — LDL CHOLESTEROL, DIRECT: Direct LDL: 168 mg/dL

## 2022-09-02 LAB — HEMOGLOBIN A1C: Hgb A1c MFr Bld: 5.8 % (ref 4.6–6.5)

## 2022-09-02 MED ORDER — VITAMIN D (ERGOCALCIFEROL) 1.25 MG (50000 UNIT) PO CAPS: 50000.0000 [IU] | ORAL_CAPSULE | ORAL | 4 refills | Status: DC

## 2022-09-02 MED ORDER — LEVOTHYROXINE SODIUM 112 MCG PO TABS: 112.0000 ug | ORAL_TABLET | Freq: Every day | ORAL | 3 refills | Status: DC

## 2022-10-18 ENCOUNTER — Other Ambulatory Visit (HOSPITAL_COMMUNITY): Payer: Self-pay | Admitting: Psychiatry

## 2022-10-18 DIAGNOSIS — F419 Anxiety disorder, unspecified: Secondary | ICD-10-CM

## 2022-10-18 DIAGNOSIS — F319 Bipolar disorder, unspecified: Secondary | ICD-10-CM

## 2022-10-21 ENCOUNTER — Encounter (HOSPITAL_COMMUNITY): Payer: Self-pay | Admitting: Psychiatry

## 2022-10-21 ENCOUNTER — Telehealth: Payer: Self-pay | Admitting: Family Medicine

## 2022-10-21 ENCOUNTER — Telehealth (HOSPITAL_BASED_OUTPATIENT_CLINIC_OR_DEPARTMENT_OTHER): Payer: Medicare HMO | Admitting: Psychiatry

## 2022-10-21 VITALS — Wt 204.0 lb

## 2022-10-21 DIAGNOSIS — F419 Anxiety disorder, unspecified: Secondary | ICD-10-CM

## 2022-10-21 DIAGNOSIS — F319 Bipolar disorder, unspecified: Secondary | ICD-10-CM | POA: Diagnosis not present

## 2022-10-21 MED ORDER — CARBAMAZEPINE ER 200 MG PO CP12: ORAL_CAPSULE | Freq: Two times a day (BID) | ORAL | 2 refills | Status: DC

## 2022-10-21 MED ORDER — AMITRIPTYLINE HCL 50 MG PO TABS: ORAL_TABLET | Freq: Every day | ORAL | 0 refills | Status: DC

## 2022-10-21 MED ORDER — RISPERIDONE 2 MG PO TABS: 2.0000 mg | ORAL_TABLET | Freq: Every day | ORAL | 0 refills | Status: DC

## 2022-10-21 NOTE — Progress Notes (Signed)
Health MD Virtual Progress Note   Patient Location: Home Provider Location: Home Office  I connect with patient by video and verified that I am speaking with correct person by using two identifiers. I discussed the limitations of evaluation and management by telemedicine and the availability of in person appointments. I also discussed with the patient that there may be a patient responsible charge related to this service. The patient expressed understanding and agreed to proceed.  Margaret [name] [id number] 72 y.o.  10/21/2022 2:12 PM  History of Present Illness:  Patient is evaluated by video session.  She is lying on the bed.  She reported her symptoms are stable and she denies any mania, psychosis, hallucination.  She has difficulty walking because of the back pain.  She is not taking any narcotic and trying to take only Tylenol.  She admitted issues with sleep as sometimes she has to sleep on the couch.  Recently she had a visit with her primary care and blood work was drawn.  Tegretol level is 7.2.  She denies any impulsive behavior, anger, irritability.  She denies any panic attack.  Her appetite is okay.  Her weight is stable.  She has no rash or any itching.  She like to keep the current medication.  She uses a ride for doctor's appointment as she does not have any car.  She is very attached to her animals and live with them.  She has few friends.  She is compliant with amitriptyline, Risperdal and Tegretol.  Past Psychiatric History: H/O anxiety, bipolar d/o and inpatient in 2009.  No h/o suicidal attempt.  Lithium worked well but discontinued due to high creatinine.     Outpatient Encounter Medications as of 10/21/2022  Medication Sig   acetaminophen (TYLENOL) 650 MG CR tablet Take 650 mg by mouth in the morning and at bedtime.   amitriptyline (ELAVIL) 50 MG tablet TAKE 1 TABLET BY MOUTH EVERY NIGHT AT BEDTIME   carbamazepine (CARBATROL) 200 MG 12 hr capsule TAKE 1  CAPSULE BY MOUTH TWICE DAILY   levothyroxine (SYNTHROID) 112 MCG tablet Take 1 tablet (112 mcg total) by mouth daily.   risperiDONE (RISPERDAL) 2 MG tablet Take 1 tablet (2 mg total) by mouth at bedtime.   Vitamin D, Ergocalciferol, (DRISDOL) 1.25 MG (50000 UNIT) CAPS capsule Take 1 capsule (50,000 Units total) by mouth every 7 (seven) days.   No facility-administered encounter medications on file as of 10/21/2022.    Recent Results (from the past 2160 hour(s))  Lipid panel     Status: Abnormal   Collection Time: 09/01/22  2:00 PM  Result Value Ref Range   Cholesterol 251 (H) 0 - 200 mg/dL    Comment: ATP III Classification       Desirable:  < 200 mg/dL               Borderline High:  200 - 239 mg/dL          High:  > = 782 mg/dL   Triglycerides 956.2 (H) 0.0 - 149.0 mg/dL    Comment: Normal:  <130 mg/dLBorderline High:  150 - 199 mg/dL   HDL 86.57 >84.69 mg/dL   VLDL 62.9 (H) 0.0 - 52.8 mg/dL   Total CHOL/HDL Ratio 5     Comment:                Men          Women1/2 Average Risk     3.4  3.3Average Risk          5.0          4.42X Average Risk          9.6          7.13X Average Risk          15.0          11.0                       NonHDL 203.99     Comment: NOTE:  Non-HDL goal should be 30 mg/dL higher than patient's LDL goal (i.e. LDL goal of < 70 mg/dL, would have non-HDL goal of < 100 mg/dL)  Comprehensive metabolic panel     Status: Abnormal   Collection Time: 09/01/22  2:00 PM  Result Value Ref Range   Sodium 140 135 - 145 mEq/L   Potassium 4.5 3.5 - 5.1 mEq/L   Chloride 105 96 - 112 mEq/L   CO2 26 19 - 32 mEq/L   Glucose, Bld 95 70 - 99 mg/dL   BUN 25 (H) 6 - 23 mg/dL   Creatinine, Ser 1.61 0.40 - 1.20 mg/dL   Total Bilirubin 0.4 0.2 - 1.2 mg/dL   Alkaline Phosphatase 96 39 - 117 U/L   AST 13 0 - 37 U/L   ALT 13 0 - 35 U/L   Total Protein 6.8 6.0 - 8.3 g/dL   Albumin 4.5 3.5 - 5.2 g/dL   GFR 09.60 (L) >45.40 mL/min    Comment: Calculated using the CKD-EPI  Creatinine Equation (2021)   Calcium 9.8 8.4 - 10.5 mg/dL  CBC with Differential/Platelet     Status: Abnormal   Collection Time: 09/01/22  2:00 PM  Result Value Ref Range   WBC 7.2 4.0 - 10.5 K/uL   RBC 4.65 3.87 - 5.11 Mil/uL   Hemoglobin 15.3 (H) 12.0 - 15.0 g/dL   HCT 98.1 19.1 - 47.8 %   MCV 98.0 78.0 - 100.0 fl   MCHC 33.6 30.0 - 36.0 g/dL   RDW 29.5 62.1 - 30.8 %   Platelets 225.0 150.0 - 400.0 K/uL   Neutrophils Relative % 68.0 43.0 - 77.0 %   Lymphocytes Relative 19.1 12.0 - 46.0 %   Monocytes Relative 9.0 3.0 - 12.0 %   Eosinophils Relative 2.9 0.0 - 5.0 %   Basophils Relative 1.0 0.0 - 3.0 %   Neutro Abs 4.9 1.4 - 7.7 K/uL   Lymphs Abs 1.4 0.7 - 4.0 K/uL   Monocytes Absolute 0.6 0.1 - 1.0 K/uL   Eosinophils Absolute 0.2 0.0 - 0.7 K/uL   Basophils Absolute 0.1 0.0 - 0.1 K/uL  Hemoglobin A1c     Status: None   Collection Time: 09/01/22  2:00 PM  Result Value Ref Range   Hgb A1c MFr Bld 5.8 4.6 - 6.5 %    Comment: Glycemic Control Guidelines for People with Diabetes:Non Diabetic:  <6%Goal of Therapy: <7%Additional Action Suggested:  >8%   TSH     Status: Abnormal   Collection Time: 09/01/22  2:00 PM  Result Value Ref Range   TSH 0.14 (L) 0.35 - 5.50 uIU/mL  VITAMIN D 25 Hydroxy (Vit-D Deficiency, Fractures)     Status: Abnormal   Collection Time: 09/01/22  2:00 PM  Result Value Ref Range   VITD 19.12 (L) 30.00 - 100.00 ng/mL  Carbamazepine Level (Tegretol), total     Status: None   Collection Time: 09/01/22  2:00 PM  Result Value Ref Range   Carbamazepine Lvl 7.4 4.0 - 12.0 mg/L  LDL cholesterol, direct     Status: None   Collection Time: 09/01/22  2:00 PM  Result Value Ref Range   Direct LDL 168.0 mg/dL    Comment: Optimal:  <884 mg/dLNear or Above Optimal:  100-129 mg/dLBorderline High:  130-159 mg/dLHigh:  160-189 mg/dLVery High:  >190 mg/dL     Psychiatric Specialty Exam: Physical Exam  Review of Systems  Musculoskeletal:  Positive for back pain.     Weight 204 lb (92.5 kg).There is no height or weight on file to calculate BMI.  General Appearance: Casual  Eye Contact:  Good  Speech:   fast  Volume:  Normal  Mood:  Euthymic  Affect:  Appropriate  Thought Process:  Goal Directed  Orientation:  Full (Time, Place, and Person)  Thought Content:  Logical  Suicidal Thoughts:  No  Homicidal Thoughts:  No  Memory:  Immediate;   Good  Judgement:  Intact  Insight:  Present  Psychomotor Activity:  Normal  Concentration:  Concentration: Fair and Attention Span: Fair  Recall:  Good  Fund of Knowledge:  Good  Language:  Good  Akathisia:  No  Handed:  Right  AIMS (if indicated):     Assets:  Communication Skills Desire for Improvement Housing  ADL's:  Intact  Cognition:  WNL  Sleep:  fair     Assessment/Plan: Bipolar 1 disorder (HCC) - Plan: amitriptyline (ELAVIL) 50 MG tablet, carbamazepine (CARBATROL) 200 MG 12 hr capsule, risperiDONE (RISPERDAL) 2 MG tablet  Anxiety - Plan: amitriptyline (ELAVIL) 50 MG tablet  Patient is stable on current medication.  Reviewed blood work results.  Tegretol level 7.2.  Continue risperidone 2 mg at bedtime, amitriptyline 50 mg at bedtime and Tegretol 200 mg twice a day.  Discussed medication side effects and benefits.  Recommend to call us back if she is any question or any concern.   Follow Up Instructions:     I discussed the assessment and treatment plan with the patient. The patient was provided an opportunity to ask questions and all were answered. The patient agreed with the plan and demonstrated an understanding of the instructions.   The patient was advised to call back or seek an in-person evaluation if the symptoms worsen or if the condition fails to improve as anticipated.    Collaboration of Care: Other provider involved in patient's care AEB notes in epic to review.  Patient/Guardian was advised Release of Information must be obtained prior to any record release in order to  collaborate their care with an outside provider. Patient/Guardian was advised if they have not already done so to contact the registration department to sign all necessary forms in order for [place] to release information regarding their care.   Consent: Patient/Guardian gives verbal consent for treatment and assignment of benefits for services provided during this visit. Patient/Guardian expressed understanding and agreed to proceed.     I provided 20 minutes of non face to face time during this encounter.  Note: This document was prepared by [employer] voice dictation technology and any errors that results from this process are unintentional.    Cleotis Nipper, MD 10/21/2022

## 2022-10-21 NOTE — Telephone Encounter (Signed)
Pt called stating that she is unable to take the vitamin D that was prescribed to her. Pt stated that when she takes it, it causes her to have pressure in head and bones feel achy.

## 2022-10-22 ENCOUNTER — Telehealth: Payer: Self-pay

## 2022-10-22 DIAGNOSIS — E559 Vitamin D deficiency, unspecified: Secondary | ICD-10-CM

## 2022-10-22 NOTE — Telephone Encounter (Signed)
Called pt lvm per Dr.Blyth said to stop the once weekly and start daily over the counter Vitamin D 3000 international units  daily,  Recheck labs in 3 months labs ordered.

## 2022-10-27 NOTE — Telephone Encounter (Signed)
Pt said she cannot tolerate the Vitamin D3, 3000 iu and wants to know what Dr. Abner Greenspan recommends she does. She said she has pressure in her head like a vice grip(not pain) and her body aches. Please call her to advise.

## 2022-11-25 DIAGNOSIS — M79675 Pain in left toe(s): Secondary | ICD-10-CM | POA: Diagnosis not present

## 2022-11-25 DIAGNOSIS — L84 Corns and callosities: Secondary | ICD-10-CM | POA: Diagnosis not present

## 2022-11-25 DIAGNOSIS — M79674 Pain in right toe(s): Secondary | ICD-10-CM | POA: Diagnosis not present

## 2022-11-25 DIAGNOSIS — B351 Tinea unguium: Secondary | ICD-10-CM | POA: Diagnosis not present

## 2023-01-14 ENCOUNTER — Other Ambulatory Visit (HOSPITAL_COMMUNITY): Payer: Self-pay | Admitting: Psychiatry

## 2023-01-14 DIAGNOSIS — F319 Bipolar disorder, unspecified: Secondary | ICD-10-CM

## 2023-01-17 ENCOUNTER — Other Ambulatory Visit (HOSPITAL_COMMUNITY): Payer: Self-pay | Admitting: Psychiatry

## 2023-01-17 DIAGNOSIS — F419 Anxiety disorder, unspecified: Secondary | ICD-10-CM

## 2023-01-17 DIAGNOSIS — F319 Bipolar disorder, unspecified: Secondary | ICD-10-CM

## 2023-01-21 ENCOUNTER — Other Ambulatory Visit: Payer: Self-pay | Admitting: *Deleted

## 2023-01-21 ENCOUNTER — Encounter (HOSPITAL_COMMUNITY): Payer: Self-pay | Admitting: Psychiatry

## 2023-01-21 ENCOUNTER — Telehealth (HOSPITAL_COMMUNITY): Payer: Medicare HMO | Admitting: Psychiatry

## 2023-01-21 ENCOUNTER — Other Ambulatory Visit (INDEPENDENT_AMBULATORY_CARE_PROVIDER_SITE_OTHER): Payer: Medicare HMO

## 2023-01-21 DIAGNOSIS — F3111 Bipolar disorder, current episode manic without psychotic features, mild: Secondary | ICD-10-CM | POA: Diagnosis not present

## 2023-01-21 DIAGNOSIS — E039 Hypothyroidism, unspecified: Secondary | ICD-10-CM | POA: Diagnosis not present

## 2023-01-21 DIAGNOSIS — F419 Anxiety disorder, unspecified: Secondary | ICD-10-CM

## 2023-01-21 DIAGNOSIS — F319 Bipolar disorder, unspecified: Secondary | ICD-10-CM

## 2023-01-21 DIAGNOSIS — E559 Vitamin D deficiency, unspecified: Secondary | ICD-10-CM

## 2023-01-21 LAB — TSH: TSH: 4.91 u[IU]/mL (ref 0.35–5.50)

## 2023-01-21 LAB — VITAMIN D 25 HYDROXY (VIT D DEFICIENCY, FRACTURES): VITD: 18.97 ng/mL — ABNORMAL LOW (ref 30.00–100.00)

## 2023-01-21 MED ORDER — RISPERIDONE 2 MG PO TABS: 2.0000 mg | ORAL_TABLET | Freq: Every day | ORAL | 0 refills | Status: DC

## 2023-01-21 MED ORDER — AMITRIPTYLINE HCL 50 MG PO TABS: ORAL_TABLET | Freq: Every day | ORAL | 0 refills | Status: DC

## 2023-01-21 MED ORDER — CARBAMAZEPINE ER 200 MG PO CP12: ORAL_CAPSULE | Freq: Two times a day (BID) | ORAL | 0 refills | Status: DC

## 2023-01-21 NOTE — Progress Notes (Signed)
[facility] MD Virtual Progress Note   Patient Location: Home Provider Location: Home Office  I connect with patient by video and verified that I am speaking with correct person by using two identifiers. I discussed the limitations of evaluation and management by telemedicine and the availability of in person appointments. I also discussed with the patient that there may be a patient responsible charge related to this service. The patient expressed understanding and agreed to proceed.  Margaret [name] [id number] 72 y.o.  01/21/2023 8:43 AM  History of Present Illness:  Patient is evaluated by video session.  She is lying on the bed.  She just woke up.  She reported things are unchanged from the past.  She admitted does not go outside unless doctor's appointment and then she asked her sister-in-law to take doctor's appointment.  She has a lot of foot pain.  She using walker when she go outside.  She has appointment coming up to see primary care in January but she is going to have her labs today.  She denies any mania, psychosis, hallucination.  She is in the process of changing her insurance.  Her last Tegretol level was 7.2 which was done in July.  She denies any panic attack, crying spells or any feeling of hopelessness or worthlessness.  She is not sure about her weight because she do not have any scale but believes she may have gained a few pounds from the past.  She like to keep the current medication..  She lives with her animals.  She had a quite Thanksgiving which she preferred.  She denies drinking or using any illegal substances.  Past Psychiatric History: H/O anxiety, bipolar d/o and inpatient in 2009.  No h/o suicidal attempt.  Lithium worked well but discontinued due to high creatinine.     Outpatient Encounter Medications as of 01/21/2023  Medication Sig   acetaminophen (TYLENOL) 650 MG CR tablet Take 650 mg by mouth in the morning and at bedtime.   amitriptyline  (ELAVIL) 50 MG tablet TAKE 1 TABLET BY MOUTH EVERY NIGHT AT BEDTIME   carbamazepine (CARBATROL) 200 MG 12 hr capsule TAKE 1 CAPSULE BY MOUTH TWICE DAILY   levothyroxine (SYNTHROID) 112 MCG tablet Take 1 tablet (112 mcg total) by mouth daily.   risperiDONE (RISPERDAL) 2 MG tablet Take 1 tablet (2 mg total) by mouth at bedtime.   No facility-administered encounter medications on file as of 01/21/2023.    No results found for this or any previous visit (from the past 2160 hour(s)).   Psychiatric Specialty Exam: Physical Exam  Review of Systems  Musculoskeletal:        Foot pain.  Need walker for balance    There were no vitals taken for this visit.There is no height or weight on file to calculate BMI.  General Appearance: Fairly Groomed  Eye Contact:  Fair  Speech:  Normal Rate  Volume:  Normal  Mood:  Euthymic  Affect:  Appropriate  Thought Process:  Goal Directed  Orientation:  Full (Time, Place, and Person)  Thought Content:  Logical  Suicidal Thoughts:  No  Homicidal Thoughts:  No  Memory:  Immediate;   Good Recent;   Fair Remote;   Fair  Judgement:  Intact  Insight:  Present  Psychomotor Activity:  Decreased  Concentration:  Concentration: Fair and Attention Span: Fair  Recall:  [employer] of Knowledge:  Fair  Language:  Good  Akathisia:  No  Handed:  Right  AIMS (if indicated):     Assets:  Communication Skills Desire for Improvement Housing Social Support  ADL's:  Intact  Cognition:  WNL  Sleep:  fair     Assessment/Plan: Bipolar 1 disorder (HCC) - Plan: amitriptyline (ELAVIL) 50 MG tablet, carbamazepine (CARBATROL) 200 MG 12 hr capsule, risperiDONE (RISPERDAL) 2 MG tablet  Anxiety - Plan: amitriptyline (ELAVIL) 50 MG tablet  Patient is stable on current medication.  She has no manic symptoms and her anxiety is controlled by medication.  She is going today for blood work for her PCP.  Her appointment with PCP is in January.  We will contact the PCP office  to add Tegretol level.  Recommended to call us back if she has any question or any concern.  Follow-up and 3 months.  Patient like to have a 90-day prescription since she had new insurance.   Follow Up Instructions:     I discussed the assessment and treatment plan with the patient. The patient was provided an opportunity to ask questions and all were answered. The patient agreed with the plan and demonstrated an understanding of the instructions.   The patient was advised to call back or seek an in-person evaluation if the symptoms worsen or if the condition fails to improve as anticipated.    Collaboration of Care: Other provider involved in patient's care AEB notes are available in epic to review  Patient/Guardian was advised Release of Information must be obtained prior to any record release in order to collaborate their care with an outside provider. Patient/Guardian was advised if they have not already done so to contact the registration department to sign all necessary forms in order for [place] to release information regarding their care.   Consent: Patient/Guardian gives verbal consent for treatment and assignment of benefits for services provided during this visit. Patient/Guardian expressed understanding and agreed to proceed.     I provided 18 minutes of non face to face time during this encounter.  Note: This document was prepared by [employer] voice dictation technology and any errors that results from this process are unintentional.    Cleotis Nipper, MD 01/21/2023

## 2023-01-22 LAB — CARBAMAZEPINE LEVEL, TOTAL: Carbamazepine Lvl: 6 mg/L (ref 4.0–12.0)

## 2023-02-05 ENCOUNTER — Telehealth (HOSPITAL_BASED_OUTPATIENT_CLINIC_OR_DEPARTMENT_OTHER): Payer: Self-pay

## 2023-02-16 DIAGNOSIS — F17211 Nicotine dependence, cigarettes, in remission: Secondary | ICD-10-CM | POA: Diagnosis not present

## 2023-02-16 DIAGNOSIS — E669 Obesity, unspecified: Secondary | ICD-10-CM | POA: Diagnosis not present

## 2023-02-16 DIAGNOSIS — F319 Bipolar disorder, unspecified: Secondary | ICD-10-CM | POA: Diagnosis not present

## 2023-02-16 DIAGNOSIS — M1990 Unspecified osteoarthritis, unspecified site: Secondary | ICD-10-CM | POA: Diagnosis not present

## 2023-02-16 DIAGNOSIS — Z6832 Body mass index (BMI) 32.0-32.9, adult: Secondary | ICD-10-CM | POA: Diagnosis not present

## 2023-02-16 DIAGNOSIS — R2681 Unsteadiness on feet: Secondary | ICD-10-CM | POA: Diagnosis not present

## 2023-02-16 DIAGNOSIS — Z008 Encounter for other general examination: Secondary | ICD-10-CM | POA: Diagnosis not present

## 2023-02-16 DIAGNOSIS — R7303 Prediabetes: Secondary | ICD-10-CM | POA: Diagnosis not present

## 2023-02-16 DIAGNOSIS — E039 Hypothyroidism, unspecified: Secondary | ICD-10-CM | POA: Diagnosis not present

## 2023-02-16 DIAGNOSIS — E785 Hyperlipidemia, unspecified: Secondary | ICD-10-CM | POA: Diagnosis not present

## 2023-03-01 NOTE — Assessment & Plan Note (Signed)
Supplement and monitor 

## 2023-03-01 NOTE — Assessment & Plan Note (Signed)
On Levothyroxine, continue to monitor 

## 2023-03-01 NOTE — Assessment & Plan Note (Signed)
Encourage heart healthy diet such as MIND or DASH diet, increase exercise, avoid trans fats, simple carbohydrates and processed foods, consider a krill or fish or flaxseed oil cap daily.  °

## 2023-03-01 NOTE — Assessment & Plan Note (Signed)
Well controlled, no changes to meds. Encouraged heart healthy diet such as the DASH diet and exercise as tolerated.  °

## 2023-03-01 NOTE — Assessment & Plan Note (Signed)
hgba1c acceptable, minimize simple carbs. Increase exercise as tolerated.  

## 2023-03-05 ENCOUNTER — Ambulatory Visit (INDEPENDENT_AMBULATORY_CARE_PROVIDER_SITE_OTHER): Payer: No Typology Code available for payment source | Admitting: Family Medicine

## 2023-03-05 ENCOUNTER — Ambulatory Visit (HOSPITAL_BASED_OUTPATIENT_CLINIC_OR_DEPARTMENT_OTHER): Payer: Medicare HMO

## 2023-03-05 VITALS — BP 158/94 | HR 79 | Temp 97.9°F | Resp 18 | Ht 64.0 in | Wt 215.2 lb

## 2023-03-05 DIAGNOSIS — R739 Hyperglycemia, unspecified: Secondary | ICD-10-CM

## 2023-03-05 DIAGNOSIS — E039 Hypothyroidism, unspecified: Secondary | ICD-10-CM

## 2023-03-05 DIAGNOSIS — I10 Essential (primary) hypertension: Secondary | ICD-10-CM | POA: Diagnosis not present

## 2023-03-05 DIAGNOSIS — E785 Hyperlipidemia, unspecified: Secondary | ICD-10-CM

## 2023-03-05 DIAGNOSIS — Z1211 Encounter for screening for malignant neoplasm of colon: Secondary | ICD-10-CM

## 2023-03-05 DIAGNOSIS — E559 Vitamin D deficiency, unspecified: Secondary | ICD-10-CM | POA: Diagnosis not present

## 2023-03-05 NOTE — Patient Instructions (Addendum)
Annual Flu and Covid boosters in the fall  Shingrix is the new shingles shot, 2 shots over 2-6 months, confirm coverage with insurance and document, then can return here for shots with nurse appt or at pharmacy   RSV, Respiratory Syncitial Virus Vaccine, Arexvy vaccine at the pharmacy  Hypertension, Adult High blood pressure (hypertension) is when the force of blood pumping through the arteries is too strong. The arteries are the blood vessels that carry blood from the heart throughout the body. Hypertension forces the heart to work harder to pump blood and may cause arteries to become narrow or stiff. Untreated or uncontrolled hypertension can lead to a heart attack, heart failure, a stroke, kidney disease, and other problems. A blood pressure reading consists of a higher number over a lower number. Ideally, your blood pressure should be below 120/80. The first ("top") number is called the systolic pressure. It is a measure of the pressure in your arteries as your heart beats. The second ("bottom") number is called the diastolic pressure. It is a measure of the pressure in your arteries as the heart relaxes. What are the causes? The exact cause of this condition is not known. There are some conditions that result in high blood pressure. What increases the risk? Certain factors may make you more likely to develop high blood pressure. Some of these risk factors are under your control, including: Smoking. Not getting enough exercise or physical activity. Being overweight. Having too much fat, sugar, calories, or salt (sodium) in your diet. Drinking too much alcohol. Other risk factors include: Having a personal history of heart disease, diabetes, high cholesterol, or kidney disease. Stress. Having a family history of high blood pressure and high cholesterol. Having obstructive sleep apnea. Age. The risk increases with age. What are the signs or symptoms? High blood pressure may not cause  symptoms. Very high blood pressure (hypertensive crisis) may cause: Headache. Fast or irregular heartbeats (palpitations). Shortness of breath. Nosebleed. Nausea and vomiting. Vision changes. Severe chest pain, dizziness, and seizures. How is this diagnosed? This condition is diagnosed by measuring your blood pressure while you are seated, with your arm resting on a flat surface, your legs uncrossed, and your feet flat on the floor. The cuff of the blood pressure monitor will be placed directly against the skin of your upper arm at the level of your heart. Blood pressure should be measured at least twice using the same arm. Certain conditions can cause a difference in blood pressure between your right and left arms. If you have a high blood pressure reading during one visit or you have normal blood pressure with other risk factors, you may be asked to: Return on a different day to have your blood pressure checked again. Monitor your blood pressure at home for 1 week or longer. If you are diagnosed with hypertension, you may have other blood or imaging tests to help your health care provider understand your overall risk for other conditions. How is this treated? This condition is treated by making healthy lifestyle changes, such as eating healthy foods, exercising more, and reducing your alcohol intake. You may be referred for counseling on a healthy diet and physical activity. Your health care provider may prescribe medicine if lifestyle changes are not enough to get your blood pressure under control and if: Your systolic blood pressure is above 130. Your diastolic blood pressure is above 80. Your personal target blood pressure may vary depending on your medical conditions, your age, and other factors.  Follow these instructions at home: Eating and drinking  Eat a diet that is high in fiber and potassium, and low in sodium, added sugar, and fat. An example of this eating plan is called the DASH  diet. DASH stands for Dietary Approaches to Stop Hypertension. To eat this way: Eat plenty of fresh fruits and vegetables. Try to fill one half of your plate at each meal with fruits and vegetables. Eat whole grains, such as whole-wheat pasta, brown rice, or whole-grain bread. Fill about one fourth of your plate with whole grains. Eat or drink low-fat dairy products, such as skim milk or low-fat yogurt. Avoid fatty cuts of meat, processed or cured meats, and poultry with skin. Fill about one fourth of your plate with lean proteins, such as fish, chicken without skin, beans, eggs, or tofu. Avoid pre-made and processed foods. These tend to be higher in sodium, added sugar, and fat. Reduce your daily sodium intake. Many people with hypertension should eat less than 1,500 mg of sodium a day. Do not drink alcohol if: Your health care provider tells you not to drink. You are pregnant, may be pregnant, or are planning to become pregnant. If you drink alcohol: Limit how much you have to: 0-1 drink a day for women. 0-2 drinks a day for men. Know how much alcohol is in your drink. In the [place] one drink equals one 12 oz bottle of beer (355 mL), one 5 oz glass of wine (148 mL), or one 1 oz glass of hard liquor (44 mL). Lifestyle  Work with your health care provider to maintain a healthy body weight or to lose weight. Ask what an ideal weight is for you. Get at least 30 minutes of exercise that causes your heart to beat faster (aerobic exercise) most days of the week. Activities may include walking, swimming, or biking. Include exercise to strengthen your muscles (resistance exercise), such as Pilates or lifting weights, as part of your weekly exercise routine. Try to do these types of exercises for 30 minutes at least 3 days a week. Do not use any products that contain nicotine or tobacco. These products include cigarettes, chewing tobacco, and vaping devices, such as e-cigarettes. If you need help  quitting, ask your health care provider. Monitor your blood pressure at home as told by your health care provider. Keep all follow-up visits. This is important. Medicines Take over-the-counter and prescription medicines only as told by your health care provider. Follow directions carefully. Blood pressure medicines must be taken as prescribed. Do not skip doses of blood pressure medicine. Doing this puts you at risk for problems and can make the medicine less effective. Ask your health care provider about side effects or reactions to medicines that you should watch for. Contact a health care provider if you: Think you are having a reaction to a medicine you are taking. Have headaches that keep coming back (recurring). Feel dizzy. Have swelling in your ankles. Have trouble with your vision. Get help right away if you: Develop a severe headache or confusion. Have unusual weakness or numbness. Feel faint. Have severe pain in your chest or abdomen. Vomit repeatedly. Have trouble breathing. These symptoms may be an emergency. Get help right away. Call 911. Do not wait to see if the symptoms will go away. Do not drive yourself to the hospital. Summary Hypertension is when the force of blood pumping through your arteries is too strong. If this condition is not controlled, it may put you at risk  for serious complications. Your personal target blood pressure may vary depending on your medical conditions, your age, and other factors. For most people, a normal blood pressure is less than 120/80. Hypertension is treated with lifestyle changes, medicines, or a combination of both. Lifestyle changes include losing weight, eating a healthy, low-sodium diet, exercising more, and limiting alcohol. This information is not intended to replace advice given to you by your health care provider. Make sure you discuss any questions you have with your health care provider. Document Revised: 12/04/2020 Document  Reviewed: 12/04/2020 Elsevier Patient Education  2024 [employer].

## 2023-03-06 LAB — COMPREHENSIVE METABOLIC PANEL
ALT: 12 U/L (ref 0–35)
AST: 11 U/L (ref 0–37)
Albumin: 4.6 g/dL (ref 3.5–5.2)
Alkaline Phosphatase: 88 U/L (ref 39–117)
BUN: 22 mg/dL (ref 6–23)
CO2: 27 meq/L (ref 19–32)
Calcium: 9.9 mg/dL (ref 8.4–10.5)
Chloride: 105 meq/L (ref 96–112)
Creatinine, Ser: 0.93 mg/dL (ref 0.40–1.20)
GFR: 61.42 mL/min (ref >60.00)
Glucose, Bld: 95 mg/dL (ref 70–99)
Potassium: 4.6 meq/L (ref 3.5–5.1)
Sodium: 142 meq/L (ref 135–145)
Total Bilirubin: 0.4 mg/dL (ref 0.2–1.2)
Total Protein: 6.7 g/dL (ref 6.0–8.3)

## 2023-03-06 LAB — LIPID PANEL
Cholesterol: 242 mg/dL — ABNORMAL HIGH (ref 0–200)
HDL: 49.1 mg/dL (ref >39.00)
LDL Cholesterol: 152 mg/dL — ABNORMAL HIGH (ref 0–99)
NonHDL: 193.12
Total CHOL/HDL Ratio: 5
Triglycerides: 208 mg/dL — ABNORMAL HIGH (ref 0.0–149.0)
VLDL: 41.6 mg/dL — ABNORMAL HIGH (ref 0.0–40.0)

## 2023-03-06 LAB — HEMOGLOBIN A1C: Hgb A1c MFr Bld: 6 % (ref 4.6–6.5)

## 2023-03-06 LAB — CBC WITH DIFFERENTIAL/PLATELET
Basophils Absolute: 0 10*3/uL (ref 0.0–0.1)
Basophils Relative: 0.7 % (ref 0.0–3.0)
Eosinophils Absolute: 0.2 10*3/uL (ref 0.0–0.7)
Eosinophils Relative: 3 % (ref 0.0–5.0)
HCT: 46.6 % — ABNORMAL HIGH (ref 36.0–46.0)
Hemoglobin: 15.6 g/dL — ABNORMAL HIGH (ref 12.0–15.0)
Lymphocytes Relative: 22.3 % (ref 12.0–46.0)
Lymphs Abs: 1.4 10*3/uL (ref 0.7–4.0)
MCHC: 33.4 g/dL (ref 30.0–36.0)
MCV: 99.3 fL (ref 78.0–100.0)
Monocytes Absolute: 0.6 10*3/uL (ref 0.1–1.0)
Monocytes Relative: 10.2 % (ref 3.0–12.0)
Neutro Abs: 3.9 10*3/uL (ref 1.4–7.7)
Neutrophils Relative %: 63.8 % (ref 43.0–77.0)
Platelets: 223 10*3/uL (ref 150.0–400.0)
RBC: 4.69 Mil/uL (ref 3.87–5.11)
RDW: 12.7 % (ref 11.5–15.5)
WBC: 6.1 10*3/uL (ref 4.0–10.5)

## 2023-03-07 ENCOUNTER — Encounter: Payer: Self-pay | Admitting: Family Medicine

## 2023-03-07 NOTE — Progress Notes (Signed)
Subjective:    Patient ID: Margaret [name], female    DOB: July 27, 1950, 73 y.o.   MRN: [id number]  Chief Complaint  Patient presents with   Follow-up    6 month    HPI Discussed the use of AI scribe software for clinical note transcription with the patient, who gave verbal consent to proceed.  History of Present Illness   The patient, with a history of hypertension, presents with concerns about elevated blood pressure. They report experiencing tinnitus, which they associate with high blood pressure. The patient also mentions significant weight gain, which they attribute to the combined effects of the pandemic and menopause. They express a desire to return to a low-fat diet, which has previously helped them lose weight quickly.  The patient also reports a reaction to anesthesia during a previous gallbladder surgery, which has made them hesitant about undergoing procedures requiring anesthesia. They mention a skin lesion on their shoulder, which has been present for about a year and occasionally itches. The patient has not noticed any changes in the lesion.  The patient also discusses their vitamin D intake, mentioning that they cannot take vitamin D supplements due to adverse effects, including arrhythmia. They have increased their milk intake to compensate for this. The patient also mentions a history of COVID-19 infection, which lasted for nine weeks and resulted in hair loss.        Past Medical History:  Diagnosis Date   Anxiety    Arthritis 02/14/2013   Bipolar disorder (HCC)    Depression 1961   depression & anxiety all my life   Esophageal reflux 09/19/2012   only slight with gallbladder problems   Essential hypertension 09/28/2018   Hypoglycemia    Hypothyroidism    Other malaise and fatigue 09/19/2012   PONV (postoperative nausea and vomiting)    Preventative health care 06/09/2016   Preventative health care 06/09/2016   PTSD (post-traumatic stress disorder)    Renal  insufficiency 12/26/2012   Thyroid disease     Past Surgical History:  Procedure Laterality Date   CHOLECYSTECTOMY N/A 09/01/2016   Procedure: LAPAROSCOPIC CHOLECYSTECTOMY WITH INTRAOPERATIVE CHOLANGIOGRAM;  Surgeon: Griselda Miner, MD;  Location: [facility] OR;  Service: General;  Laterality: N/A;   DG 4TH DIGIT LEFT FOOT     EYE SURGERY     when i was a child   fooet surgery Left 2014   reconstruction of foot and ankle to correct deformity   NECK SURGERY     OTHER SURGICAL HISTORY     OTHER SURGICAL HISTORY     OTHER SURGICAL HISTORY     TUBAL LIGATION     reversal    Family History  Problem Relation Age of Onset   Diabetes Mother        Brother 1 of 2   Heart failure Mother    Arthritis Mother    Depression Mother    Early death Mother    Heart disease Mother    Prostate cancer Father    Parkinsonism Father        deceased   Hearing loss Father    Hypertension Brother    ADD / ADHD Brother    Anxiety disorder Brother    Esophageal cancer Maternal Grandmother    Cancer Paternal Grandmother        cancer of jawbone 1 of 2   Melanoma Brother    Alcohol abuse Brother    Anxiety disorder Brother    Diabetes Brother  Hearing loss Brother    Breast cancer Neg Hx    Colon cancer Neg Hx     Social History   Socioeconomic History   Marital status: Divorced    Spouse name: Not on file   Number of children: Not on file   Years of education: Not on file   Highest education level: Associate degree: academic program  Occupational History   Not on file  Tobacco Use   Smoking status: Former    Current packs/day: 0.00    Average packs/day: 2.0 packs/day for 10.0 years (20.0 ttl pk-yrs)    Types: Cigarettes    Start date: 02/11/1983    Quit date: 02/10/1993    Years since quitting: 30.0   Smokeless tobacco: Never   Tobacco comments:    will never smoke again  Vaping Use   Vaping status: Never Used  Substance and Sexual Activity   Alcohol use: No   Drug use: No    Sexual activity: Not Currently    Birth control/protection: Abstinence  Other Topics Concern   Not on file  Social History Narrative   Not on file   Social Drivers of Health   Financial Resource Strain: High Risk (02/26/2023)   Overall Financial Resource Strain (CARDIA)    Difficulty of Paying Living Expenses: Hard  Food Insecurity: Food Insecurity Present (02/26/2023)   Hunger Vital Sign    Worried About Running Out of Food in the Last Year: Sometimes true    Ran Out of Food in the Last Year: Never true  Transportation Needs: Unmet Transportation Needs (02/26/2023)   PRAPARE - Transportation    Lack of Transportation (Medical): Yes    Lack of Transportation (Non-Medical): Yes  Physical Activity: Unknown (02/26/2023)   Exercise Vital Sign    Days of Exercise per Week: 0 days    Minutes of Exercise per Session: Not on file  Stress: No Stress Concern Present (02/26/2023)   [employer] of Occupational Health - Occupational Stress Questionnaire    Feeling of Stress : Not at all  Social Connections: Socially Isolated (02/26/2023)   Social Connection and Isolation Panel [NHANES]    Frequency of Communication with Friends and Family: More than three times a week    Frequency of Social Gatherings with Friends and Family: Once a week    Attends Religious Services: Never    Database administrator or Organizations: No    Attends Engineer, structural: Not on file    Marital Status: Divorced  Intimate Partner Violence: Not At Risk (04/16/2022)   Humiliation, Afraid, Rape, and Kick questionnaire    Fear of Current or Ex-Partner: No    Emotionally Abused: No    Physically Abused: No    Sexually Abused: No    Outpatient Medications Prior to Visit  Medication Sig Dispense Refill   acetaminophen (TYLENOL) 650 MG CR tablet Take 650 mg by mouth in the morning and at bedtime.     amitriptyline (ELAVIL) 50 MG tablet TAKE 1 TABLET BY MOUTH EVERY NIGHT AT BEDTIME 90 tablet 0    carbamazepine (CARBATROL) 200 MG 12 hr capsule TAKE 1 CAPSULE BY MOUTH TWICE DAILY 180 capsule 0   levothyroxine (SYNTHROID) 112 MCG tablet Take 1 tablet (112 mcg total) by mouth daily. 90 tablet 3   risperiDONE (RISPERDAL) 2 MG tablet Take 1 tablet (2 mg total) by mouth at bedtime. 90 tablet 0   No facility-administered medications prior to visit.    Allergies  Allergen Reactions  Codeine Nausea Only   Darvon Other (See Comments)    Hallucinations    Fentanyl Anxiety and Palpitations   Novocain [Procaine] Palpitations and Other (See Comments)    Heart race   Sulfa Drugs Cross Reactors Nausea And Vomiting    Review of Systems  Constitutional:  Negative for fever and malaise/fatigue.  HENT:  Negative for congestion.   Eyes:  Negative for blurred vision.  Respiratory:  Negative for shortness of breath.   Cardiovascular:  Negative for chest pain, palpitations and leg swelling.  Gastrointestinal:  Negative for abdominal pain, blood in stool and nausea.  Genitourinary:  Negative for dysuria and frequency.  Musculoskeletal:  Negative for falls.  Skin:  Negative for rash.  Neurological:  Negative for dizziness, loss of consciousness and headaches.  Endo/Heme/Allergies:  Negative for environmental allergies.  Psychiatric/Behavioral:  Negative for depression. The patient is nervous/anxious.        Objective:    Physical Exam Constitutional:      General: She is not in acute distress.    Appearance: Normal appearance. She is well-developed. She is not toxic-appearing.  HENT:     Head: Normocephalic and atraumatic.     Right Ear: External ear normal.     Left Ear: External ear normal.     Nose: Nose normal.  Eyes:     General:        Right eye: No discharge.        Left eye: No discharge.     Conjunctiva/sclera: Conjunctivae normal.  Neck:     Thyroid: No thyromegaly.  Cardiovascular:     Rate and Rhythm: Normal rate and regular rhythm.     Heart sounds: Normal heart  sounds. No murmur heard. Pulmonary:     Effort: Pulmonary effort is normal. No respiratory distress.     Breath sounds: Normal breath sounds.  Abdominal:     General: Bowel sounds are normal.     Palpations: Abdomen is soft.     Tenderness: There is no abdominal tenderness. There is no guarding.  Musculoskeletal:        General: Normal range of motion.     Cervical back: Neck supple.  Lymphadenopathy:     Cervical: No cervical adenopathy.  Skin:    General: Skin is warm and dry.  Neurological:     Mental Status: She is oriented to person, place, and time.  Psychiatric:        Mood and Affect: Mood normal.        Behavior: Behavior normal.        Thought Content: Thought content normal.        Judgment: Judgment normal.     BP (!) 158/94   Pulse 79   Temp 97.9 F (36.6 C) (Oral)   Resp 18   Ht 5\' 4"  (1.626 m)   Wt 215 lb 3.2 oz (97.6 kg)   SpO2 98%   BMI 36.94 kg/m  Wt Readings from Last 3 Encounters:  03/05/23 215 lb 3.2 oz (97.6 kg)  09/01/22 204 lb 9.6 oz (92.8 kg)  04/25/22 207 lb 3.2 oz (94 kg)    Diabetic Foot Exam - Simple   No data filed    Lab Results  Component Value Date   WBC 6.1 03/05/2023   HGB 15.6 (H) 03/05/2023   HCT 46.6 (H) 03/05/2023   PLT 223.0 03/05/2023   GLUCOSE 95 03/05/2023   CHOL 242 (H) 03/05/2023   TRIG 208.0 (H) 03/05/2023   HDL  49.10 03/05/2023   LDLDIRECT 168.0 09/01/2022   LDLCALC 152 (H) 03/05/2023   ALT 12 03/05/2023   AST 11 03/05/2023   NA 142 03/05/2023   K 4.6 03/05/2023   CL 105 03/05/2023   CREATININE 0.93 03/05/2023   BUN 22 03/05/2023   CO2 27 03/05/2023   TSH 4.91 01/21/2023   HGBA1C 6.0 03/05/2023    Lab Results  Component Value Date   TSH 4.91 01/21/2023   Lab Results  Component Value Date   WBC 6.1 03/05/2023   HGB 15.6 (H) 03/05/2023   HCT 46.6 (H) 03/05/2023   MCV 99.3 03/05/2023   PLT 223.0 03/05/2023   Lab Results  Component Value Date   NA 142 03/05/2023   K 4.6 03/05/2023   CO2 27  03/05/2023   GLUCOSE 95 03/05/2023   BUN 22 03/05/2023   CREATININE 0.93 03/05/2023   BILITOT 0.4 03/05/2023   ALKPHOS 88 03/05/2023   AST 11 03/05/2023   ALT 12 03/05/2023   PROT 6.7 03/05/2023   ALBUMIN 4.6 03/05/2023   CALCIUM 9.9 03/05/2023   ANIONGAP 4 (L) 08/26/2016   GFR 61.42 03/05/2023   Lab Results  Component Value Date   CHOL 242 (H) 03/05/2023   Lab Results  Component Value Date   HDL 49.10 03/05/2023   Lab Results  Component Value Date   LDLCALC 152 (H) 03/05/2023   Lab Results  Component Value Date   TRIG 208.0 (H) 03/05/2023   Lab Results  Component Value Date   CHOLHDL 5 03/05/2023   Lab Results  Component Value Date   HGBA1C 6.0 03/05/2023       Assessment & Plan:  Essential hypertension Assessment & Plan: Well controlled, no changes to meds. Encouraged heart healthy diet such as the DASH diet and exercise as tolerated.   Orders: -     CBC with Differential/Platelet -     Comprehensive metabolic panel  Hyperglycemia Assessment & Plan: hgba1c acceptable, minimize simple carbs. Increase exercise as tolerated.   Orders: -     Hemoglobin A1c  Hyperlipidemia, unspecified hyperlipidemia type Assessment & Plan: Encourage heart healthy diet such as MIND or DASH diet, increase exercise, avoid trans fats, simple carbohydrates and processed foods, consider a krill or fish or flaxseed oil cap daily.   Orders: -     Lipid panel  Hypothyroidism, unspecified type Assessment & Plan: On Levothyroxine, continue to monitor    Vitamin D deficiency Assessment & Plan: Supplement and monitor   Colon cancer screening -     Cologuard    Assessment and Plan    Hypertension Elevated blood pressure likely due to stress, disrupted sleep, and dietary changes. -Plan for home blood pressure monitoring. -Schedule a nurse visit in 4-6 weeks for blood pressure recheck.  Weight Gain Rapid weight gain over the past six months, possibly due to stress  and dietary changes. -Plan to check kidney function, blood sugar, and thyroid function.  Prediabetes Hemoglobin A1c of 5.8 six months ago, indicating prediabetes. -Check Hemoglobin A1c today. -If A1c is 6.5 or higher, consider starting weight loss medication (Ozempic, Trulicity, Mounjaro) if patient is interested.  Vitamin D Deficiency Unable to tolerate oral vitamin D supplementation due to side effects. -Recommend 10-15 minutes of sun exposure daily. -Plan to recheck vitamin D levels in a few months.  Dehydration Signs of dehydration noted on physical exam. -Advise to drink 60-80 ounces of water daily, spread throughout the day.  Skin Lesion Noticed a skin lesion on  the shoulder about a year ago. -Recommend annual dermatology exam.  Colon Cancer Screening Due for colonoscopy but had previous adverse reaction to anesthesia. -Order Cologuard test. -If positive, recommend consultation with a gastroenterologist.         Danise Edge, MD

## 2023-03-09 ENCOUNTER — Ambulatory Visit: Payer: Self-pay | Admitting: Family Medicine

## 2023-03-09 DIAGNOSIS — Z87891 Personal history of nicotine dependence: Secondary | ICD-10-CM | POA: Diagnosis not present

## 2023-03-09 DIAGNOSIS — E785 Hyperlipidemia, unspecified: Secondary | ICD-10-CM | POA: Diagnosis not present

## 2023-03-09 DIAGNOSIS — F319 Bipolar disorder, unspecified: Secondary | ICD-10-CM | POA: Diagnosis not present

## 2023-03-09 DIAGNOSIS — M150 Primary generalized (osteo)arthritis: Secondary | ICD-10-CM | POA: Diagnosis not present

## 2023-03-09 DIAGNOSIS — R7303 Prediabetes: Secondary | ICD-10-CM | POA: Diagnosis not present

## 2023-03-09 DIAGNOSIS — Z66 Do not resuscitate: Secondary | ICD-10-CM | POA: Diagnosis not present

## 2023-03-09 DIAGNOSIS — E66812 Obesity, class 2: Secondary | ICD-10-CM | POA: Diagnosis not present

## 2023-03-09 DIAGNOSIS — R2681 Unsteadiness on feet: Secondary | ICD-10-CM | POA: Diagnosis not present

## 2023-03-09 DIAGNOSIS — Z6836 Body mass index (BMI) 36.0-36.9, adult: Secondary | ICD-10-CM | POA: Diagnosis not present

## 2023-03-09 DIAGNOSIS — E039 Hypothyroidism, unspecified: Secondary | ICD-10-CM | POA: Diagnosis not present

## 2023-03-09 NOTE — Telephone Encounter (Signed)
  Chief Complaint: Home health reporting elevated BP Symptoms: asymptomatic  Disposition: [] ED /[] Urgent Care (no appt availability in office) / [] Appointment(In office/virtual)/ []  [facility]/ [] Home Care/ [] Refused Recommended Disposition /[] [facility]/ [x]  Follow-up with PCP Additional Notes: See below   Copied from CRM #[id number]. Topic: Clinical - Pink Word Triage >> Mar 09, 2023  2:43 PM Samuel Jester B wrote: Reason for Triage:Christine the home health nurse called for the pt to report her blood pressure being 150/98 and A sympthomatic. The are coming out tot he home for Physical, therapy occupational therapy,  and home health. Callback number is [phone] Reason for Disposition  [1] Caller requesting NON-URGENT health information AND [2] PCP's office is the best resource  Answer Assessment - Initial Assessment Questions 1. REASON FOR CALL or QUESTION: "What is your reason for calling today?" or "How can I best help you?" or "What question do you have that I can help answer?"     Wynona Canes from [facility] calls to report patient blood pressure was elevated during home health visit today at 150/98. She reports PT is still present and BP is now at 140/85 and patient has stayed asymptomatic.   Caller also reports that the patient's home health orders were written by a NP with [facility]. She states the contact information she has for this provider is invalid, wanting clarification on who is managing home health orders. Contact number is [phone] [facility].  Protocols used: Information Only Call - No Triage-A-AH

## 2023-03-09 NOTE — Telephone Encounter (Signed)
Called and spoke with [facility] health nurse). She wanted to report the patient's blood pressure to the PCP.It was 150/98. The patent told her it was "White coat syndrome" When the physical therapist got there, it had gone down to 140/85.

## 2023-03-10 NOTE — Telephone Encounter (Signed)
Called and left voicemail for Margaret [name] to give [place] a call back

## 2023-03-10 NOTE — Telephone Encounter (Signed)
Followed up with home health nurse. She was given instructions per your note. She said she would call next week with an update on the blood pressure.

## 2023-03-12 ENCOUNTER — Telehealth: Payer: Self-pay | Admitting: Family Medicine

## 2023-03-12 NOTE — Telephone Encounter (Signed)
Trey Paula is requesting pt orders for patient for twice a week for three weeks he is requesting a verbal order

## 2023-03-18 ENCOUNTER — Telehealth: Payer: Self-pay | Admitting: Family Medicine

## 2023-03-18 NOTE — Telephone Encounter (Signed)
 Copied from CRM [id number]. Topic: Medicare AWV >> Mar 18, 2023  1:49 PM Nathanel DEL wrote: Reason for CRM: Called LVM 03/18/2023 to schedule AWV. Please schedule Virtual or Telehealth visits ONLY.   Nathanel Paschal; Care Guide Ambulatory Clinical Support [place] [facility] Direct Dial: [phone]

## 2023-04-14 ENCOUNTER — Ambulatory Visit: Payer: No Typology Code available for payment source

## 2023-04-17 ENCOUNTER — Encounter (HOSPITAL_COMMUNITY): Payer: Self-pay | Admitting: Psychiatry

## 2023-04-17 ENCOUNTER — Telehealth (HOSPITAL_BASED_OUTPATIENT_CLINIC_OR_DEPARTMENT_OTHER): Payer: Self-pay | Admitting: Psychiatry

## 2023-04-17 VITALS — Wt 215.0 lb

## 2023-04-17 DIAGNOSIS — F319 Bipolar disorder, unspecified: Secondary | ICD-10-CM

## 2023-04-17 DIAGNOSIS — F419 Anxiety disorder, unspecified: Secondary | ICD-10-CM

## 2023-04-17 MED ORDER — RISPERIDONE 2 MG PO TABS: 2.0000 mg | ORAL_TABLET | Freq: Every day | ORAL | 0 refills | Status: DC

## 2023-04-17 MED ORDER — AMITRIPTYLINE HCL 50 MG PO TABS: ORAL_TABLET | Freq: Every day | ORAL | 0 refills | Status: DC

## 2023-04-17 MED ORDER — CARBAMAZEPINE ER 200 MG PO CP12
ORAL_CAPSULE | Freq: Two times a day (BID) | ORAL | 0 refills | Status: DC
Start: 2023-04-17 — End: 2023-06-11

## 2023-04-17 NOTE — Progress Notes (Signed)
 [facility] MD Virtual Progress Note   Patient Location: Home Provider Location: Home Office  I connect with patient by video and verified that I am speaking with correct person by using two identifiers. I discussed the limitations of evaluation and management by telemedicine and the availability of in person appointments. I also discussed with the patient that there may be a patient responsible charge related to this service. The patient expressed understanding and agreed to proceed.  Margaret [name] [id number] 73 y.o.  04/17/2023 10:04 AM  History of Present Illness:  Elevated by video session.  She is a stable on her current medication.  Recently she had labs.  She gained 3 pounds in past 6 months and she believes every time her doctor changes to her thyroid medicine her weight fluctuates.  Her hemoglobin A1c stable at 6.0.  She has no tremors, shakes or any EPS.  She does not drive and usually her sister-in-law takes her to the doctor's appointment.  Patient reported her foot pain is not as bad and she is able to walk and taking the dogs outside.  She denies any mania, psychosis, hallucination.  She denies any crying spells or any panic attack.  She has no tremor or shakes or any EPS.  She lives with her animals.  She like to keep the current medication.  Past Psychiatric History: H/O anxiety, bipolar d/o and inpatient in 2009.  No h/o suicidal attempt.  Lithium worked well but discontinued due to high creatinine.    Outpatient Encounter Medications as of 04/17/2023  Medication Sig   acetaminophen (TYLENOL) 650 MG CR tablet Take 650 mg by mouth in the morning and at bedtime.   amitriptyline (ELAVIL) 50 MG tablet TAKE 1 TABLET BY MOUTH EVERY NIGHT AT BEDTIME   carbamazepine (CARBATROL) 200 MG 12 hr capsule TAKE 1 CAPSULE BY MOUTH TWICE DAILY   levothyroxine (SYNTHROID) 112 MCG tablet Take 1 tablet (112 mcg total) by mouth daily.   risperiDONE (RISPERDAL) 2 MG tablet Take 1 tablet  (2 mg total) by mouth at bedtime.   No facility-administered encounter medications on file as of 04/17/2023.    Recent Results (from the past 2160 hours)  TSH     Status: None   Collection Time: 01/21/23 12:55 PM  Result Value Ref Range   TSH 4.91 0.35 - 5.50 uIU/mL  Carbamazepine Level (Tegretol), total     Status: None   Collection Time: 01/21/23 12:55 PM  Result Value Ref Range   Carbamazepine Lvl 6.0 4.0 - 12.0 mg/L  VITAMIN D 25 Hydroxy (Vit-D Deficiency, Fractures)     Status: Abnormal   Collection Time: 01/21/23 12:55 PM  Result Value Ref Range   VITD 18.97 (L) 30.00 - 100.00 ng/mL  CBC with Differential/Platelet     Status: Abnormal   Collection Time: 03/05/23  2:19 PM  Result Value Ref Range   WBC 6.1 4.0 - 10.5 K/uL   RBC 4.69 3.87 - 5.11 Mil/uL   Hemoglobin 15.6 (H) 12.0 - 15.0 g/dL   HCT 82.9 (H) 56.2 - 13.0 %   MCV 99.3 78.0 - 100.0 fl   MCHC 33.4 30.0 - 36.0 g/dL   RDW 86.5 78.4 - 69.6 %   Platelets 223.0 150.0 - 400.0 K/uL   Neutrophils Relative % 63.8 43.0 - 77.0 %   Lymphocytes Relative 22.3 12.0 - 46.0 %   Monocytes Relative 10.2 3.0 - 12.0 %   Eosinophils Relative 3.0 0.0 - 5.0 %   Basophils  Relative 0.7 0.0 - 3.0 %   Neutro Abs 3.9 1.4 - 7.7 K/uL   Lymphs Abs 1.4 0.7 - 4.0 K/uL   Monocytes Absolute 0.6 0.1 - 1.0 K/uL   Eosinophils Absolute 0.2 0.0 - 0.7 K/uL   Basophils Absolute 0.0 0.0 - 0.1 K/uL  Comprehensive metabolic panel     Status: None   Collection Time: 03/05/23  2:19 PM  Result Value Ref Range   Sodium 142 135 - 145 mEq/L   Potassium 4.6 3.5 - 5.1 mEq/L   Chloride 105 96 - 112 mEq/L   CO2 27 19 - 32 mEq/L   Glucose, Bld 95 70 - 99 mg/dL   BUN 22 6 - 23 mg/dL   Creatinine, Ser 4.09 0.40 - 1.20 mg/dL   Total Bilirubin 0.4 0.2 - 1.2 mg/dL   Alkaline Phosphatase 88 39 - 117 U/L   AST 11 0 - 37 U/L   ALT 12 0 - 35 U/L   Total Protein 6.7 6.0 - 8.3 g/dL   Albumin 4.6 3.5 - 5.2 g/dL   GFR 81.19 >14.78 mL/min    Comment: Calculated using  the CKD-EPI Creatinine Equation (2021)   Calcium 9.9 8.4 - 10.5 mg/dL  Lipid panel     Status: Abnormal   Collection Time: 03/05/23  2:19 PM  Result Value Ref Range   Cholesterol 242 (H) 0 - 200 mg/dL    Comment: ATP III Classification       Desirable:  < 200 mg/dL               Borderline High:  200 - 239 mg/dL          High:  > = 295 mg/dL   Triglycerides 621.3 (H) 0.0 - 149.0 mg/dL    Comment: Normal:  <086 mg/dLBorderline High:  150 - 199 mg/dL   HDL 57.84 >69.62 mg/dL   VLDL 95.2 (H) 0.0 - 84.1 mg/dL   LDL Cholesterol 324 (H) 0 - 99 mg/dL   Total CHOL/HDL Ratio 5     Comment:                Men          Women1/2 Average Risk     3.4          3.3Average Risk          5.0          4.42X Average Risk          9.6          7.13X Average Risk          15.0          11.0                       NonHDL 193.12     Comment: NOTE:  Non-HDL goal should be 30 mg/dL higher than patient's LDL goal (i.e. LDL goal of < 70 mg/dL, would have non-HDL goal of < 100 mg/dL)  HgB M0N     Status: None   Collection Time: 03/05/23  2:19 PM  Result Value Ref Range   Hgb A1c MFr Bld 6.0 4.6 - 6.5 %    Comment: Glycemic Control Guidelines for People with Diabetes:Non Diabetic:  <6%Goal of Therapy: <7%Additional Action Suggested:  >8%      Psychiatric Specialty Exam: Physical Exam  Review of Systems  Weight 215 lb (97.5 kg).There is no height or weight on file to  calculate BMI.  General Appearance: Casual  Eye Contact:  Good  Speech:  Normal Rate  Volume:  Normal  Mood:  Euthymic  Affect:  Appropriate  Thought Process:  Goal Directed  Orientation:  Full (Time, Place, and Person)  Thought Content:  Logical  Suicidal Thoughts:  No  Homicidal Thoughts:  No  Memory:  Immediate;   Good Recent;   Good Remote;   Good  Judgement:  Good  Insight:  Present  Psychomotor Activity:  Normal  Concentration:  Concentration: Good and Attention Span: Good  Recall:  Good  Fund of Knowledge:  Good  Language:  Good   Akathisia:  No  Handed:  Right  AIMS (if indicated):     Assets:  Communication Skills Desire for Improvement Housing  ADL's:  Intact  Cognition:  WNL  Sleep:  ok     Assessment/Plan: Bipolar 1 disorder (HCC) - Plan: amitriptyline (ELAVIL) 50 MG tablet, carbamazepine (CARBATROL) 200 MG 12 hr capsule, risperiDONE (RISPERDAL) 2 MG tablet  Anxiety - Plan: amitriptyline (ELAVIL) 50 MG tablet  I reviewed blood work results.  Hemoglobin A1c 6.0.  Her plan is to discuss with her primary care doctor about weight gain as every time thyroid medicine changes her weight fluctuates.  Patient like to keep the current medication.  Continue amitriptyline 50 mg at bedtime, Risperdal 2 mg at bedtime and Tegretol 200 mg twice a day.  Patient not interested in therapy.  Recommended to call us back if she has any question or concern.  Follow-up in 3 months.   Follow Up Instructions:     I discussed the assessment and treatment plan with the patient. The patient was provided an opportunity to ask questions and all were answered. The patient agreed with the plan and demonstrated an understanding of the instructions.   The patient was advised to call back or seek an in-person evaluation if the symptoms worsen or if the condition fails to improve as anticipated.    Collaboration of Care: Other provider involved in patient's care AEB notes are available in epic to review  Patient/Guardian was advised Release of Information must be obtained prior to any record release in order to collaborate their care with an outside provider. Patient/Guardian was advised if they have not already done so to contact the registration department to sign all necessary forms in order for [place] to release information regarding their care.   Consent: Patient/Guardian gives verbal consent for treatment and assignment of benefits for services provided during this visit. Patient/Guardian expressed understanding and agreed to proceed.      I provided 18 minutes of non face to face time during this encounter.  Note: This document was prepared by [employer] voice dictation technology and any errors that results from this process are unintentional.    Cleotis Nipper, MD 04/17/2023

## 2023-04-29 ENCOUNTER — Other Ambulatory Visit (HOSPITAL_COMMUNITY): Payer: Self-pay | Admitting: *Deleted

## 2023-04-30 ENCOUNTER — Other Ambulatory Visit (HOSPITAL_COMMUNITY): Payer: Self-pay | Admitting: *Deleted

## 2023-04-30 NOTE — Telephone Encounter (Signed)
 PA for Carbatrol 200 mg ER capsules has been Approved from 03/14/23 through 04/28/24.

## 2023-06-11 ENCOUNTER — Other Ambulatory Visit (HOSPITAL_COMMUNITY): Payer: Self-pay

## 2023-06-11 DIAGNOSIS — F419 Anxiety disorder, unspecified: Secondary | ICD-10-CM

## 2023-06-11 DIAGNOSIS — F319 Bipolar disorder, unspecified: Secondary | ICD-10-CM

## 2023-06-11 MED ORDER — CARBAMAZEPINE ER 200 MG PO CP12: ORAL_CAPSULE | Freq: Two times a day (BID) | ORAL | 0 refills | Status: DC

## 2023-06-11 MED ORDER — RISPERIDONE 2 MG PO TABS: 2.0000 mg | ORAL_TABLET | Freq: Every day | ORAL | 0 refills | Status: DC

## 2023-06-11 MED ORDER — AMITRIPTYLINE HCL 50 MG PO TABS: ORAL_TABLET | Freq: Every day | ORAL | 0 refills | Status: DC

## 2023-07-20 ENCOUNTER — Encounter (HOSPITAL_COMMUNITY): Payer: Self-pay | Admitting: Psychiatry

## 2023-07-20 ENCOUNTER — Telehealth (HOSPITAL_COMMUNITY): Admitting: Psychiatry

## 2023-07-20 DIAGNOSIS — F419 Anxiety disorder, unspecified: Secondary | ICD-10-CM

## 2023-07-20 DIAGNOSIS — F319 Bipolar disorder, unspecified: Secondary | ICD-10-CM | POA: Diagnosis not present

## 2023-07-20 MED ORDER — CARBAMAZEPINE ER 200 MG PO CP12: ORAL_CAPSULE | Freq: Two times a day (BID) | ORAL | 0 refills | Status: DC

## 2023-07-20 MED ORDER — RISPERIDONE 2 MG PO TABS: 2.0000 mg | ORAL_TABLET | Freq: Every day | ORAL | 0 refills | Status: DC

## 2023-07-20 MED ORDER — AMITRIPTYLINE HCL 50 MG PO TABS: ORAL_TABLET | Freq: Every day | ORAL | 0 refills | Status: DC

## 2023-07-20 NOTE — Progress Notes (Signed)
 [facility] MD Virtual Progress Note   Patient Location: Home Provider Location: Home Office  I connect with patient by video and verified that I am speaking with correct person by using two identifiers. I discussed the limitations of evaluation and management by telemedicine and the availability of in person appointments. I also discussed with the patient that there may be a patient responsible charge related to this service. The patient expressed understanding and agreed to proceed.  Margaret [name] [id number] 73 y.o.  07/20/2023 1:46 PM  History of Present Illness:  Patient is evaluated by video session.  She reported things are going okay and she is hoping to buy a car end of this year.  She still uses public transport or sometimes her sister-in-law takes her to the doctor's appointment.  She is compliant with medication.  She takes care of her dogs and animals.  She denies any mania, psychosis, irritability, anger or any severe mood swings.  Her appetite is okay.  Sometimes she has trouble sleeping because of back and hip pain.  She only takes Tylenol  because all other medication make her very groggy sleepy.  She has no tremors shakes or any EPS.  She wants to continue Tegretol , amitriptyline  and Risperdal .  Her recent labs are okay.  Her last Tegretol  was in December 2024 which was 6.  She has no concern for the medication.  Her PCP is Dr. Daril Edge.  Past Psychiatric History: H/O anxiety, bipolar d/o and inpatient in 2009.  No h/o suicidal attempt.  Lithium  worked well but discontinued due to high creatinine.     Outpatient Encounter Medications as of 07/20/2023  Medication Sig   acetaminophen  (TYLENOL ) 650 MG CR tablet Take 650 mg by mouth in the morning and at bedtime.   amitriptyline  (ELAVIL ) 50 MG tablet TAKE 1 TABLET BY MOUTH EVERY NIGHT AT BEDTIME   carbamazepine  (CARBATROL ) 200 MG 12 hr capsule TAKE 1 CAPSULE BY MOUTH TWICE DAILY   levothyroxine  (SYNTHROID ) 112 MCG  tablet Take 1 tablet (112 mcg total) by mouth daily.   risperiDONE  (RISPERDAL ) 2 MG tablet Take 1 tablet (2 mg total) by mouth at bedtime.   No facility-administered encounter medications on file as of 07/20/2023.    No results found for this or any previous visit (from the past 2160 hours).   Psychiatric Specialty Exam: Physical Exam  Review of Systems  Weight 215 lb (97.5 kg).There is no height or weight on file to calculate BMI.  General Appearance: Fairly Groomed  Eye Contact:  Fair  Speech:  Normal Rate  Volume:  Normal  Mood:  Euthymic  Affect:  Appropriate  Thought Process:  Goal Directed  Orientation:  Full (Time, Place, and Person)  Thought Content:  Logical  Suicidal Thoughts:  No  Homicidal Thoughts:  No  Memory:  Immediate;   Good Recent;   Good Remote;   Good  Judgement:  Intact  Insight:  Present  Psychomotor Activity:  Normal  Concentration:  Concentration: Good and Attention Span: Good  Recall:  Good  Fund of Knowledge:  Good  Language:  Good  Akathisia:  No  Handed:  Right  AIMS (if indicated):     Assets:  Communication Skills Desire for Improvement Housing Transportation  ADL's:  Intact  Cognition:  WNL  Sleep:  fair       09/01/2022    1:11 PM 04/16/2022    3:45 PM 08/20/2021    2:30 PM 02/21/2021    4:00 PM  09/21/2018    9:36 AM  Depression screen PHQ 2/9  Decreased Interest 0 0 0 0 0  Down, Depressed, Hopeless 0 0 1 1 0  PHQ - 2 Score 0 0 1 1 0  Altered sleeping 0  0    Tired, decreased energy 0  0    Change in appetite 0  0    Feeling bad or failure about yourself  0  0    Trouble concentrating 0  0    Moving slowly or fidgety/restless 0  0    Suicidal thoughts 0  0    PHQ-9 Score 0  1    Difficult doing work/chores Not difficult at all  Not difficult at all      Assessment/Plan: Bipolar 1 disorder (HCC) - Plan: amitriptyline  (ELAVIL ) 50 MG tablet, carbamazepine  (CARBATROL ) 200 MG 12 hr capsule, risperiDONE  (RISPERDAL ) 2 MG  tablet  Anxiety - Plan: amitriptyline  (ELAVIL ) 50 MG tablet  Patient is stable on current medication.  She does not want to change the dose since keeping her mood stable.  Continue amitriptyline  50 mg at bedtime, risperidone  2 mg at bedtime and Tegretol  200 mg twice a day.  She is not interested in therapy.  Recommend to call us  back if she has any question or any concern.  Follow-up in 3 months.   Follow Up Instructions:     I discussed the assessment and treatment plan with the patient. The patient was provided an opportunity to ask questions and all were answered. The patient agreed with the plan and demonstrated an understanding of the instructions.   The patient was advised to call back or seek an in-person evaluation if the symptoms worsen or if the condition fails to improve as anticipated.    Collaboration of Care: Other provider involved in patient's care AEB notes are available in epic to review  Patient/Guardian was advised Release of Information must be obtained prior to any record release in order to collaborate their care with an outside provider. Patient/Guardian was advised if they have not already done so to contact the registration department to sign all necessary forms in order for us  to release information regarding their care.   Consent: Patient/Guardian gives verbal consent for treatment and assignment of benefits for services provided during this visit. Patient/Guardian expressed understanding and agreed to proceed.     Total encounter time 18 minutes which includes face-to-face time, chart reviewed, care coordination, order entry and documentation during this encounter.   Note: This document was prepared by [employer] voice dictation technology and any errors that results from this process are unintentional.    Arturo Late, MD 07/20/2023

## 2023-08-03 ENCOUNTER — Ambulatory Visit: Payer: No Typology Code available for payment source | Admitting: Family Medicine

## 2023-08-03 ENCOUNTER — Other Ambulatory Visit: Payer: Self-pay | Admitting: Family Medicine

## 2023-08-07 ENCOUNTER — Encounter: Payer: Self-pay | Admitting: Student

## 2023-08-07 ENCOUNTER — Ambulatory Visit (INDEPENDENT_AMBULATORY_CARE_PROVIDER_SITE_OTHER): Admitting: Student

## 2023-08-07 VITALS — BP 178/80 | HR 98 | Temp 98.1°F | Ht 64.0 in | Wt 204.0 lb

## 2023-08-07 DIAGNOSIS — R5383 Other fatigue: Secondary | ICD-10-CM | POA: Insufficient documentation

## 2023-08-07 DIAGNOSIS — M8580 Other specified disorders of bone density and structure, unspecified site: Secondary | ICD-10-CM

## 2023-08-07 DIAGNOSIS — F3111 Bipolar disorder, current episode manic without psychotic features, mild: Secondary | ICD-10-CM | POA: Diagnosis not present

## 2023-08-07 DIAGNOSIS — N289 Disorder of kidney and ureter, unspecified: Secondary | ICD-10-CM | POA: Diagnosis not present

## 2023-08-07 DIAGNOSIS — Z1231 Encounter for screening mammogram for malignant neoplasm of breast: Secondary | ICD-10-CM

## 2023-08-07 DIAGNOSIS — E559 Vitamin D deficiency, unspecified: Secondary | ICD-10-CM | POA: Diagnosis not present

## 2023-08-07 DIAGNOSIS — I10 Essential (primary) hypertension: Secondary | ICD-10-CM | POA: Diagnosis not present

## 2023-08-07 DIAGNOSIS — Z1211 Encounter for screening for malignant neoplasm of colon: Secondary | ICD-10-CM

## 2023-08-07 DIAGNOSIS — E785 Hyperlipidemia, unspecified: Secondary | ICD-10-CM | POA: Diagnosis not present

## 2023-08-07 DIAGNOSIS — R739 Hyperglycemia, unspecified: Secondary | ICD-10-CM | POA: Diagnosis not present

## 2023-08-07 DIAGNOSIS — Z780 Asymptomatic menopausal state: Secondary | ICD-10-CM | POA: Insufficient documentation

## 2023-08-07 DIAGNOSIS — Z0000 Encounter for general adult medical examination without abnormal findings: Secondary | ICD-10-CM | POA: Diagnosis not present

## 2023-08-07 DIAGNOSIS — Z5181 Encounter for therapeutic drug level monitoring: Secondary | ICD-10-CM | POA: Diagnosis not present

## 2023-08-07 DIAGNOSIS — E039 Hypothyroidism, unspecified: Secondary | ICD-10-CM | POA: Diagnosis not present

## 2023-08-07 NOTE — Assessment & Plan Note (Signed)
 Supplement and monitor

## 2023-08-07 NOTE — Assessment & Plan Note (Signed)
 Stable on current medications.  Followed by behavioral health.  Continue to monitor carbamazepine  level, updated today and reviewed.

## 2023-08-07 NOTE — Assessment & Plan Note (Signed)
 On levothyroxine .  Continue to monitor.

## 2023-08-07 NOTE — Assessment & Plan Note (Signed)
 Hydrate and monitor

## 2023-08-07 NOTE — Assessment & Plan Note (Addendum)
 Last 2 office blood pressures have been uncontrolled. Patient reports that her BP at home have been controlled 120-130 SBP/70-80 DBP.  Patient does have history of whitecoat hypertension. She is +mania during  OV today, this may be contributing to the elevation.   Discussed importance of blood pressure control creased risk of CVE, and discussed BP medication. Patient does not want to start BP medication and prefers to manage BP with lifestyle measures.  Recommendations:  - BP goal <140/90 - monitor and log blood pressures at home - check around the same time each day in a relaxed setting - Limit salt to <2000 mg/day - Follow DASH eating plan (heart healthy diet) - avoid tobacco products - get at least 2 hours of regular aerobic exercise weekly Patient aware of signs/symptoms requiring further/urgent evaluation. Labs updated today.    Encouraged heart healthy diet such as the DASH diet and exercise as tolerated.

## 2023-08-07 NOTE — Assessment & Plan Note (Signed)
 hgba1c acceptable, minimize simple carbs. Increase exercise as tolerated.

## 2023-08-07 NOTE — Assessment & Plan Note (Addendum)
Bone density shows osteopenia. Recommend calcium intake of 1200 to 1500 mg daily, divided into roughly 3 doses. Best source is the diet and a single dairy serving is about 500 mg, a supplement of calcium citrate once or twice daily to balance diet is fine if not getting enough in diet. Also need Vitamin D 2000 IU caps, 1 cap daily if not already taking vitamin D. Also recommend weight baring exercise on hips and upper body to keep bones strong 

## 2023-08-07 NOTE — Progress Notes (Signed)
 Subjective:     Patient ID: Margaret [name], female    DOB: 1950/09/03, 73 y.o.   MRN: [id number]  Chief Complaint  Patient presents with   Follow-up    Want to discuss getting thyroid  check, running out of prescription; also want to discuss VitD issue, can't take the supplements, gives heart palpatations   Hypertension    Been fine    HPI Margaret [name]  73 year old presenting for follow-up on chronic conditions. 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  Is followed by behavioral health.  She reports an inability to tolerate vitamin supplements due to side effects, including hallucinations and palpitations with vitamin D . She continues to experience chronic hip and lower back pain.  She is taking Tylenol  twice daily for this but does still experience pain.  Patient also reports fatigue, which has been a chronic, persistent.  Bipolar Disorder-by behavioral health Carbamazepine  200 mg twice daily, risperidone  2 mg daily  Hypothyroidism Levothyroxine  112 mcg daily  Vitamin D  deficiency She has not been taking Vit D supplement.  Patient states that she has had multiple reactions to this medication. Lab Results  Component Value Date   VD25OH 18.97 (L) 01/21/2023    Hypertension Currently not on medication  Reports 120/70s at home   Patient reports compliant with all medications  HCM Colonoscopy-Refuses colonoscopy. Cologuard due. Denies Mgm: March 2023. Due. DEXA: Last March 2023- Osteopenia. Repeat 2 years. The patient prefers to manage her conditions with medications and is not interested in undergoing any surgeries or procedures.    Patient denies fever, chills, SOB, CP, palpitations, dyspnea, edema, HA, vision changes, N/V/D, abdominal pain, urinary symptoms, rash, weight changes, and recent illness or hospitalizations.        History of Present Illness         Health Maintenance Due  Topic Date Due   Zoster Vaccines- Shingrix  (1 of 2) Never done   Medicare Annual Wellness (AWV)  04/16/2023   MAMMOGRAM  04/23/2023    Past Medical History:  Diagnosis Date   Anxiety    Arthritis 02/14/2013   Bipolar disorder (HCC)    Depression 1961   depression & anxiety all my life   Esophageal reflux 09/19/2012   only slight with gallbladder problems   Essential hypertension 09/28/2018   Hypoglycemia    Hypothyroidism    Other malaise and fatigue 09/19/2012   PONV (postoperative nausea and vomiting)    Preventative health care 06/09/2016   Preventative health care 06/09/2016   PTSD (post-traumatic stress disorder)    Renal insufficiency 12/26/2012   Thyroid  disease     Past Surgical History:  Procedure Laterality Date   CHOLECYSTECTOMY N/A 09/01/2016   Procedure: LAPAROSCOPIC CHOLECYSTECTOMY WITH INTRAOPERATIVE CHOLANGIOGRAM;  Surgeon: Curvin Deward MOULD, MD;  Location: [facility] OR;  Service: General;  Laterality: N/A;   DG 4TH DIGIT LEFT FOOT     EYE SURGERY     when i was a child   fooet surgery Left 2014   reconstruction of foot and ankle to correct deformity   NECK SURGERY     OTHER SURGICAL HISTORY     OTHER SURGICAL HISTORY     OTHER SURGICAL HISTORY     TUBAL LIGATION     reversal    Family History  Problem Relation Age of Onset   Diabetes Mother        Brother 1 of 2   Heart failure Mother  Arthritis Mother    Depression Mother    Early death Mother    Heart disease Mother    Prostate cancer Father    Parkinsonism Father        deceased   Hearing loss Father    Hypertension Brother    ADD / ADHD Brother    Anxiety disorder Brother    Esophageal cancer Maternal Grandmother    Cancer Paternal Grandmother        cancer of jawbone 1 of 2   Melanoma Brother    Alcohol abuse Brother    Anxiety disorder Brother    Diabetes Brother    Hearing loss Brother    Breast cancer Neg Hx    Colon cancer Neg Hx     Social History   Socioeconomic History   Marital status: Divorced    Spouse name:  Not on file   Number of children: Not on file   Years of education: Not on file   Highest education level: Associate degree: occupational, Scientist, product/process development, or vocational program  Occupational History   Not on file  Tobacco Use   Smoking status: Former    Current packs/day: 0.00    Average packs/day: 2.0 packs/day for 10.0 years (20.0 ttl pk-yrs)    Types: Cigarettes    Start date: 02/11/1983    Quit date: 02/10/1993    Years since quitting: 30.5   Smokeless tobacco: Never   Tobacco comments:    will never smoke again  Vaping Use   Vaping status: Never Used  Substance and Sexual Activity   Alcohol use: No   Drug use: No   Sexual activity: Not Currently    Birth control/protection: Abstinence  Other Topics Concern   Not on file  Social History Narrative   Lives alone. No children. Dogs-2 pitbulls   Social Drivers of Health   Financial Resource Strain: Medium Risk (08/01/2023)   Overall Financial Resource Strain (CARDIA)    Difficulty of Paying Living Expenses: Somewhat hard  Food Insecurity: Food Insecurity Present (08/01/2023)   Hunger Vital Sign    Worried About Running Out of Food in the Last Year: Sometimes true    Ran Out of Food in the Last Year: Sometimes true  Transportation Needs: Unmet Transportation Needs (08/01/2023)   PRAPARE - Administrator, Civil Service (Medical): Yes    Lack of Transportation (Non-Medical): Yes  Physical Activity: Sufficiently Active (08/01/2023)   Exercise Vital Sign    Days of Exercise per Week: 7 days    Minutes of Exercise per Session: 30 min  Stress: No Stress Concern Present (08/01/2023)   [employer] of Occupational Health - Occupational Stress Questionnaire    Feeling of Stress: Not at all  Social Connections: Socially Isolated (08/01/2023)   Social Connection and Isolation Panel    Frequency of Communication with Friends and Family: More than three times a week    Frequency of Social Gatherings with Friends and Family:  Once a week    Attends Religious Services: Never    Database administrator or Organizations: No    Attends Engineer, structural: Not on file    Marital Status: Divorced  Intimate Partner Violence: Not At Risk (04/16/2022)   Humiliation, Afraid, Rape, and Kick questionnaire    Fear of Current or Ex-Partner: No    Emotionally Abused: No    Physically Abused: No    Sexually Abused: No    Outpatient Medications Prior to Visit  Medication  Sig Dispense Refill   acetaminophen  (TYLENOL ) 650 MG CR tablet Take 650 mg by mouth in the morning and at bedtime.     amitriptyline  (ELAVIL ) 50 MG tablet TAKE 1 TABLET BY MOUTH EVERY NIGHT AT BEDTIME 90 tablet 0   carbamazepine  (CARBATROL ) 200 MG 12 hr capsule TAKE 1 CAPSULE BY MOUTH TWICE DAILY 180 capsule 0   levothyroxine  (SYNTHROID ) 112 MCG tablet Take 1 tablet (112 mcg total) by mouth daily before breakfast. 90 tablet 0   risperiDONE  (RISPERDAL ) 2 MG tablet Take 1 tablet (2 mg total) by mouth at bedtime. 90 tablet 0   No facility-administered medications prior to visit.    Allergies  Allergen Reactions   Codeine Nausea Only   Darvon Other (See Comments)    Hallucinations    Fentanyl  Anxiety and Palpitations   Novocain [Procaine] Palpitations and Other (See Comments)    Heart race   Sulfa Drugs Cross Reactors Nausea And Vomiting    ROS See HPI    Objective:    Physical Exam  Physical Exam Constitutional:      General: She is not in acute distress.    Appearance: Normal appearance. She is well-developed. She is not toxic-appearing.  HENT:     Head: Normocephalic and atraumatic.     Right Ear: External ear normal.     Left Ear: External ear normal.     Nose: Nose normal.  Eyes:     General:        Right eye: No discharge.        Left eye: No discharge.     Conjunctiva/sclera: Conjunctivae normal.  Neck:     Thyroid : No thyromegaly.  Cardiovascular:     Rate and Rhythm: Normal rate and regular rhythm.     Heart sounds:  Normal heart sounds. No murmur heard. Pulmonary:     Effort: Pulmonary effort is normal. No respiratory distress.     Breath sounds: Normal breath sounds.  Abdominal:     General: Bowel sounds are normal.     Palpations: Abdomen is soft.     Tenderness: There is no abdominal tenderness. There is no guarding.  Musculoskeletal:        General: Normal range of motion.     Cervical back: Neck supple.  Lymphadenopathy:     Cervical: No cervical adenopathy.  Skin:    General: Skin is warm and dry.  Neurological:     Mental Status: She is alert and oriented to person, place, and time.  Psychiatric:        Mood and Affect: +mania    Behavior: Behavior normal.            BP (!) 178/80 (BP Location: Right Arm)   Pulse 98   Temp 98.1 F (36.7 C)   Ht 5' 4 (1.626 m)   Wt 204 lb (92.5 kg)   SpO2 96%   BMI 35.02 kg/m  Wt Readings from Last 3 Encounters:  08/07/23 204 lb (92.5 kg)  03/05/23 215 lb 3.2 oz (97.6 kg)  09/01/22 204 lb 9.6 oz (92.8 kg)       Assessment & Plan:   Problem List Items Addressed This Visit     Bipolar disorder (HCC)   Stable on current medications.  Followed by behavioral health.  Continue to monitor carbamazepine  level, updated today and reviewed.      Essential hypertension   Last 2 office blood pressures have been uncontrolled. Patient reports that her BP at home have  been controlled 120-130 SBP/70-80 DBP.  Patient does have history of whitecoat hypertension. She is +mania during  OV today, this may be contributing to the elevation.   Discussed importance of blood pressure control creased risk of CVE, and discussed BP medication. Patient does not want to start BP medication and prefers to manage BP with lifestyle measures.  Recommendations:  - BP goal <140/90 - monitor and log blood pressures at home - check around the same time each day in a relaxed setting - Limit salt to <2000 mg/day - Follow DASH eating plan (heart healthy diet) - avoid  tobacco products - get at least 2 hours of regular aerobic exercise weekly Patient aware of signs/symptoms requiring further/urgent evaluation. Labs updated today.    Encouraged heart healthy diet such as the DASH diet and exercise as tolerated.        Hyperglycemia   hgba1c acceptable, minimize simple carbs. Increase exercise as tolerated.         Relevant Orders   HgB A1c   Hyperlipidemia   Encourage heart healthy diet such as MIND or DASH diet, increase exercise, avoid trans fats, simple carbohydrates and processed foods, consider a krill or fish or flaxseed oil cap daily.        Relevant Orders   Lipid panel   Hypothyroidism   On levothyroxine .  Continue to monitor.      Relevant Orders   TSH   Osteopenia   Bone density shows osteopenia. Recommend calcium  intake of 1200 to 1500 mg daily, divided into roughly 3 doses. Best source is the diet and a single dairy serving is about 500 mg, a supplement of calcium  citrate once or twice daily to balance diet is fine if not getting enough in diet. Also need Vitamin D  2000 IU caps, 1 cap daily if not already taking vitamin D . Also recommend weight baring exercise on hips and upper body to keep bones strong.       Other fatigue   Relevant Orders   Vitamin B12   Postmenopausal estrogen deficiency   Relevant Orders   DG Bone Density   Preventative health care - Primary   Patient encouraged to maintain heart healthy diet, regular exercise, adequate sleep. Consider daily probiotics. Take medications as prescribed. Labs ordered and reviewed.   HCM -Refused colonoscopy and Cologuard test.  Patient prefers to d/c. -Last Pap smear in 2018, patient declines further Pap. -Last mammogram in March 2023.  Referral for mammogram sent today and patient to complete. -DEXA 2023, referral sent to repeat DEXA. -Refused pneumonia, shingles, COVID, and flu vaccines. Document refusal in patient's chart. -Check blood pressure at home  occasionally. -Encouraged to maintain dental health for heart health. -Continue current bone density management (last scan showed osteopenia).      Renal insufficiency   Hydrate and monitor.      Relevant Orders   CBC with Differential/Platelet   Comp Met (CMET)   Vitamin D  deficiency   Supplement and monitor.      Relevant Orders   VITAMIN D  25 Hydroxy (Vit-D Deficiency, Fractures)   Other Visit Diagnoses       Encounter for therapeutic drug level monitoring       Relevant Orders   Carbamazepine  Level (Tegretol ), total     Screening mammogram for breast cancer       Relevant Orders   MM DIGITAL SCREENING BILATERAL     Colon cancer screening            Follow-up  in 4-6 months, unless lab results indicate need for earlier visit.   Portions of this note were dictated using [employer] voice recognition software. Please disregard any errors in transcription.   Patient was educated on the diagnosis, treatment options, potential risks, benefits, and alternatives. All questions were addressed. Patient verbalized understanding and agrees with the plan of care. Will follow up as advised or sooner if symptoms worsen or new concerns arise.     I am having Lakin Rhine. Linarez maintain her acetaminophen , amitriptyline , carbamazepine , risperiDONE , and levothyroxine .  No orders of the defined types were placed in this encounter.

## 2023-08-07 NOTE — Assessment & Plan Note (Signed)
 Encourage heart healthy diet such as MIND or DASH diet, increase exercise, avoid trans fats, simple carbohydrates and processed foods, consider a krill or fish or flaxseed oil cap daily.

## 2023-08-07 NOTE — Assessment & Plan Note (Addendum)
 Patient encouraged to maintain heart healthy diet, regular exercise, adequate sleep. Consider daily probiotics. Take medications as prescribed. Labs ordered and reviewed.   HCM -Refused colonoscopy and Cologuard test.  Patient prefers to d/c. -Last Pap smear in 2018, patient declines further Pap. -Last mammogram in March 2023.  Referral for mammogram sent today and patient to complete. -DEXA 2023, referral sent to repeat DEXA. -Refused pneumonia, shingles, COVID, and flu vaccines. Document refusal in patient's chart. -Check blood pressure at home occasionally. -Encouraged to maintain dental health for heart health. -Continue current bone density management (last scan showed osteopenia).

## 2023-08-08 LAB — CBC WITH DIFFERENTIAL/PLATELET
Absolute Lymphocytes: 1123 {cells}/uL (ref 850–3900)
Absolute Monocytes: 626 {cells}/uL (ref 200–950)
Basophils Absolute: 29 {cells}/uL (ref 0–200)
Basophils Relative: 0.4 %
Eosinophils Absolute: 137 {cells}/uL (ref 15–500)
Eosinophils Relative: 1.9 %
HCT: 46.3 % — ABNORMAL HIGH (ref 35.0–45.0)
Hemoglobin: 15.6 g/dL — ABNORMAL HIGH (ref 11.7–15.5)
MCH: 33.1 pg — ABNORMAL HIGH (ref 27.0–33.0)
MCHC: 33.7 g/dL (ref 32.0–36.0)
MCV: 98.1 fL (ref 80.0–100.0)
MPV: 9.7 fL (ref 7.5–12.5)
Monocytes Relative: 8.7 %
Neutro Abs: 5285 {cells}/uL (ref 1500–7800)
Neutrophils Relative %: 73.4 %
Platelets: 241 10*3/uL (ref 140–400)
RBC: 4.72 10*6/uL (ref 3.80–5.10)
RDW: 12.6 % (ref 11.0–15.0)
Total Lymphocyte: 15.6 %
WBC: 7.2 10*3/uL (ref 3.8–10.8)

## 2023-08-08 LAB — LIPID PANEL
Cholesterol: 226 mg/dL — ABNORMAL HIGH (ref <200)
HDL: 50 mg/dL (ref >=50)
LDL Cholesterol (Calc): 129 mg/dL — ABNORMAL HIGH
Non-HDL Cholesterol (Calc): 176 mg/dL — ABNORMAL HIGH (ref <130)
Total CHOL/HDL Ratio: 4.5 (calc) (ref <5.0)
Triglycerides: 326 mg/dL — ABNORMAL HIGH (ref <150)

## 2023-08-08 LAB — COMPREHENSIVE METABOLIC PANEL WITH GFR
AG Ratio: 2.1 (calc) (ref 1.0–2.5)
ALT: 15 U/L (ref 6–29)
AST: 13 U/L (ref 10–35)
Albumin: 4.5 g/dL (ref 3.6–5.1)
Alkaline phosphatase (APISO): 94 U/L (ref 37–153)
BUN/Creatinine Ratio: 31 (calc) — ABNORMAL HIGH (ref 6–22)
BUN: 30 mg/dL — ABNORMAL HIGH (ref 7–25)
CO2: 22 mmol/L (ref 20–32)
Calcium: 9.8 mg/dL (ref 8.6–10.4)
Chloride: 108 mmol/L (ref 98–110)
Creat: 0.98 mg/dL (ref 0.60–1.00)
Globulin: 2.1 g/dL (ref 1.9–3.7)
Glucose, Bld: 133 mg/dL — ABNORMAL HIGH (ref 65–99)
Potassium: 4.2 mmol/L (ref 3.5–5.3)
Sodium: 142 mmol/L (ref 135–146)
Total Bilirubin: 0.3 mg/dL (ref 0.2–1.2)
Total Protein: 6.6 g/dL (ref 6.1–8.1)
eGFR: 61 mL/min/{1.73_m2} (ref >=60)

## 2023-08-08 LAB — HEMOGLOBIN A1C
Hgb A1c MFr Bld: 5.9 % — ABNORMAL HIGH (ref <5.7)
Mean Plasma Glucose: 123 mg/dL
eAG (mmol/L): 6.8 mmol/L

## 2023-08-08 LAB — VITAMIN B12: Vitamin B-12: 385 pg/mL (ref 200–1100)

## 2023-08-08 LAB — CARBAMAZEPINE LEVEL, TOTAL: Carbamazepine Lvl: 6.4 mg/L (ref 4.0–12.0)

## 2023-08-08 LAB — VITAMIN D 25 HYDROXY (VIT D DEFICIENCY, FRACTURES): Vit D, 25-Hydroxy: 28 ng/mL — ABNORMAL LOW (ref 30–100)

## 2023-08-08 LAB — TSH: TSH: 2.73 m[IU]/L (ref 0.40–4.50)

## 2023-08-11 ENCOUNTER — Ambulatory Visit: Payer: Self-pay | Admitting: Student

## 2023-08-12 ENCOUNTER — Telehealth: Payer: Self-pay | Admitting: Family Medicine

## 2023-08-12 ENCOUNTER — Other Ambulatory Visit: Payer: Self-pay | Admitting: Family

## 2023-08-12 MED ORDER — VITAMIN D (ERGOCALCIFEROL) 1.25 MG (50000 UNIT) PO CAPS: 50000.0000 [IU] | ORAL_CAPSULE | ORAL | 1 refills | Status: DC

## 2023-08-12 NOTE — Telephone Encounter (Signed)
**Note De-identified  [place] Obfuscation** Please advise 

## 2023-08-12 NOTE — Telephone Encounter (Signed)
 Copied from CRM [id number]. Topic: Clinical - Medication Refill >> Aug 12, 2023 12:48 PM Tiffini S wrote: Medication: Vitamin D  50,000 Unit daily  Has the patient contacted their pharmacy? No (Agent: If no, request that the patient contact the pharmacy for the refill. If patient does not wish to contact the pharmacy document the reason why and proceed with request.) (Agent: If yes, when and what did the pharmacy advise?)  This is the patient's preferred pharmacy:  [id number] - [place], [place] - [address] AT [address] [place] [address] Phone: [phone] Fax: [phone]   Is this the correct pharmacy for this prescription? Yes If no, delete pharmacy and type the correct one.   Has the prescription been filled recently? Yes  Is the patient out of the medication? Yes  Has the patient been seen for an appointment in the last year OR does the patient have an upcoming appointment? Yes  Can we respond through MyChart? No, phone call   Agent: Please be advised that Rx refills may take up to 3 business days. We ask that you follow-up with your pharmacy.

## 2023-08-26 ENCOUNTER — Ambulatory Visit: Payer: Self-pay

## 2023-08-26 NOTE — Telephone Encounter (Signed)
 FYI Only or Action Required?: FYI only for provider.  Patient was last seen in primary care on 08/07/2023 by Wheeler Harlene CROME, NP.  Called Nurse Triage reporting Hypertension.  Symptoms began several days ago.  Interventions attempted: Nothing.  Symptoms are: unchanged.  Triage Disposition: See Physician Within 24 Hours  Patient/caregiver understands and will follow disposition?: Yes  Apt tomorrow.  Copied from CRM [id number]. Topic: Clinical - Red Word Triage >> Aug 26, 2023 12:15 PM Jasmin G wrote: Red Word that prompted transfer to Nurse Triage: Blood pressure has stayed elevated for a long period of time, it also fluctuates a lot. Reason for Disposition  Systolic BP >= 180 OR Diastolic >= 110  Answer Assessment - Initial Assessment Questions 1. BLOOD PRESSURE: What is your blood pressure? Did you take at least two measurements 5 minutes apart?     190/80 in the office 2. ONSET: When did you take your blood pressure?     Elevated at home since visit 3. HOW: How did you take your blood pressure? (e.g., automatic home BP monitor, visiting nurse)     Nurse in the office 4. HISTORY: Do you have a history of high blood pressure?     htn 5. MEDICINES: Are you taking any medicines for blood pressure? Have you missed any doses recently?     denies 6. OTHER SYMPTOMS: Do you have any symptoms? (e.g., blurred vision, chest pain, difficulty breathing, headache, weakness)     Weakness which is chronic 7. PREGNANCY: Is there any chance you are pregnant? When was your last menstrual period?     na  Protocols used: Blood Pressure - High-A-AH

## 2023-08-28 ENCOUNTER — Ambulatory Visit (INDEPENDENT_AMBULATORY_CARE_PROVIDER_SITE_OTHER): Admitting: Student

## 2023-08-28 ENCOUNTER — Encounter: Payer: Self-pay | Admitting: Student

## 2023-08-28 VITALS — BP 170/80 | HR 105 | Temp 98.0°F | Ht 64.0 in | Wt 201.2 lb

## 2023-08-28 DIAGNOSIS — G8929 Other chronic pain: Secondary | ICD-10-CM

## 2023-08-28 DIAGNOSIS — M545 Low back pain, unspecified: Secondary | ICD-10-CM | POA: Diagnosis not present

## 2023-08-28 DIAGNOSIS — I10 Essential (primary) hypertension: Secondary | ICD-10-CM

## 2023-08-28 MED ORDER — AMLODIPINE BESYLATE 2.5 MG PO TABS: 2.5000 mg | ORAL_TABLET | Freq: Every day | ORAL | 0 refills | Status: DC

## 2023-08-28 NOTE — Assessment & Plan Note (Signed)
 Increase Tylenol  to 650 mg 3 times daily.  Discussed option of PT with patient, she declines at this time.  Patient prefers conservative therapy, if pain persists or worsens consider re-imaging.

## 2023-08-28 NOTE — Assessment & Plan Note (Addendum)
 Not controlled.  Patient reports history of whitecoat hypertension.  BP Readings from Last 3 Encounters:  08/28/23 (!) 170/80  08/07/23 (!) 178/80  03/07/23 (!) 158/94   Rx-amlodipine 2.5 mg daily.  Follow-up in 4 weeks.    Discussed importance of blood pressure control and goal BP >140/90.  Encouraged patient to monitor BP at home and log readings and bring to next appointment. Encourage heart healthy diet, limit sodium intake.

## 2023-08-28 NOTE — Progress Notes (Signed)
 Acute Office Visit  Subjective:     Patient ID: Margaret [name], female    DOB: May 17, 1950, 73 y.o.   MRN: [id number]  Chief Complaint  Patient presents with   Hypertension    HPI   Patient presents today for follow-up .  Patient presents with concerns about elevated blood pressure and is interested in discussing medication options. Her last three office visits have shown elevated readings, although she reports a history of white coat hypertension. Recently, she experienced a sensation of blood rushing to her ears, while dealing with stressful event at vet with her dog, she attributed this feeling to high blood pressure, has not experienced this again.  She has not been taking her blood pressure at home, in process of ordering a new BP cuff that occur when she has is too small.    She also reports chronic back pain that has progressively worsened over time. The pain is described as moderate to severe, particularly with activity. She denies any radiation of pain, numbness, tingling, or weakness. Currently, she is taking Tylenol  650 mg twice daily with minimal relief.  Denies bowel and bladder irregularities.  Denies falls, syncope, recent trauma.  Patient denies fever, chills, SOB, CP, palpitations, dyspnea, edema, HA, vision changes, N/V/D, abdominal pain, urinary symptoms, rash, weight changes, and recent illness or hospitalizations.       Patient denies fever, chills, SOB, CP, palpitations, dyspnea, edema, HA, vision changes, N/V/D, abdominal pain, urinary symptoms, rash, weight changes, and recent illness or hospitalizations.   ROS See HPI     Objective:    BP (!) 170/80 (BP Location: Left Arm, Cuff Size: Normal)   Pulse (!) 105   Temp 98 F (36.7 C) (Oral)   Ht 5' 4 (1.626 m)   Wt 201 lb 3.2 oz (91.3 kg)   SpO2 97%   BMI 34.54 kg/m    Physical Exam  General: No acute distress. Awake and conversant.  Eyes: Normal conjunctiva, anicteric. Round symmetric pupils.   Neck: Neck is supple. No masses or thyromegaly.  Respiratory: CTAB. Respirations are non-labored. No wheezing.  Skin: Warm. No rashes or ulcers.  Psych: Alert and oriented. Cooperative. CV: RRR. No murmur. No lower extremity edema.  MSK: No clubbing or cyanosis. TWP over the lower lumbar spine and paraspinal muscles Range of motion is limited with flexion due to discomfort, no focal neurologic deficits or radicular symptoms observed. Neuro:  CN II-XII grossly normal.    No results found for any visits on 08/28/23.      Assessment & Plan:   Problem List Items Addressed This Visit     Chronic midline low back pain without sciatica   Increase Tylenol  to 650 mg 3 times daily.  Discussed option of PT with patient, she declines at this time.  Patient prefers conservative therapy, if pain persists or worsens consider re-imaging.      Essential hypertension - Primary   Not controlled.  Patient reports history of whitecoat hypertension.  BP Readings from Last 3 Encounters:  08/28/23 (!) 170/80  08/07/23 (!) 178/80  03/07/23 (!) 158/94   Rx-amlodipine 2.5 mg daily.  Follow-up in 4 weeks.    Discussed importance of blood pressure control and goal BP >140/90.  Encouraged patient to monitor BP at home and log readings and bring to next appointment. Encourage heart healthy diet, limit sodium intake.        Relevant Medications   amLODipine (NORVASC) 2.5 MG tablet   Portions of this  note were dictated using DR[employer] voice recognition software. Please disregard any errors in transcription.     Meds ordered this encounter  Medications   amLODipine (NORVASC) 2.5 MG tablet    Sig: Take 1 tablet (2.5 mg total) by mouth daily.    Dispense:  60 tablet    Refill:  0    Supervising Provider:   DOMENICA BLACKBIRD A [[id number]]    Return in about 4 weeks (around 09/25/2023).  Margaret Hovis L Kendry Pfarr, NP

## 2023-09-02 ENCOUNTER — Ambulatory Visit: Payer: No Typology Code available for payment source

## 2023-09-25 ENCOUNTER — Ambulatory Visit: Admitting: Student

## 2023-10-13 ENCOUNTER — Other Ambulatory Visit (HOSPITAL_COMMUNITY): Payer: Self-pay | Admitting: Psychiatry

## 2023-10-13 ENCOUNTER — Telehealth: Payer: Self-pay | Admitting: Family Medicine

## 2023-10-13 DIAGNOSIS — F319 Bipolar disorder, unspecified: Secondary | ICD-10-CM

## 2023-10-13 NOTE — Telephone Encounter (Signed)
 Copied from CRM #[id number]. Topic: Medicare AWV >> Oct 13, 2023  9:54 AM Nathanel DEL wrote: Reason for CRM: Called LVM 10/13/2023 to schedule AWV. Please schedule office or virtual visits.  Nathanel Paschal; Care Guide Ambulatory Clinical Support [facility] Direct Dial: [phone]

## 2023-10-18 ENCOUNTER — Other Ambulatory Visit: Payer: Self-pay | Admitting: Family

## 2023-10-19 ENCOUNTER — Encounter (HOSPITAL_COMMUNITY): Payer: Self-pay | Admitting: Psychiatry

## 2023-10-19 ENCOUNTER — Telehealth (HOSPITAL_COMMUNITY): Admitting: Psychiatry

## 2023-10-19 VITALS — Wt 201.0 lb

## 2023-10-19 DIAGNOSIS — F419 Anxiety disorder, unspecified: Secondary | ICD-10-CM

## 2023-10-19 DIAGNOSIS — F319 Bipolar disorder, unspecified: Secondary | ICD-10-CM

## 2023-10-19 MED ORDER — CARBAMAZEPINE ER 200 MG PO CP12: ORAL_CAPSULE | Freq: Two times a day (BID) | ORAL | 0 refills | Status: DC

## 2023-10-19 MED ORDER — RISPERIDONE 2 MG PO TABS: 2.0000 mg | ORAL_TABLET | Freq: Every day | ORAL | 0 refills | Status: DC

## 2023-10-19 MED ORDER — AMITRIPTYLINE HCL 50 MG PO TABS: ORAL_TABLET | Freq: Every day | ORAL | 0 refills | Status: DC

## 2023-10-19 NOTE — Progress Notes (Signed)
 [facility] MD Virtual Progress Note   Patient Location: Home Provider Location: Home Office  I connect with patient by video and verified that I am speaking with correct person by using two identifiers. I discussed the limitations of evaluation and management by telemedicine and the availability of in person appointments. I also discussed with the patient that there may be a patient responsible charge related to this service. The patient expressed understanding and agreed to proceed.  Margaret [name] [id number] 73 y.o.  10/19/2023 3:14 PM  History of Present Illness:  Patient is evaluated by video session.  She reported things are going okay and denies any mood swings, mania, anger.  She sleeps okay.  She is trying to lose weight and lost few pounds since the last visit.  Patient told her next-door neighbor is now cooking food for her and she is vegan and that is helping her weight loss.  She also had a visit with primary care and she had lab.  She has high cholesterol but she is watching her calorie intake and hoping to have next blood work results normal.  She sleeps good.  Sometimes she gets irritated with the pain but denies any anger, mood swing, mania or any active or passive suicidal thoughts.  Patient told her PCP recommend to take the blood pressure medicine but she feels her blood pressure is normal and she checks blood pressure at home frequently which is normal.  She like to continue Tegretol , amitriptyline  and risperidone .  She denies any panic attack.  She denies drinking or using any illegal substances.  She is not interested in therapy.  Past Psychiatric History: H/O anxiety, bipolar d/o and inpatient in 2009.  No h/o suicidal attempt.  Lithium  worked well but discontinued due to high creatinine.      Past Medical History:  Diagnosis Date   Anxiety    Arthritis 02/14/2013   Bipolar disorder (HCC)    Depression 1961   depression & anxiety all my life   Esophageal  reflux 09/19/2012   only slight with gallbladder problems   Essential hypertension 09/28/2018   Hypoglycemia    Hypothyroidism    Other malaise and fatigue 09/19/2012   PONV (postoperative nausea and vomiting)    Preventative health care 06/09/2016   Preventative health care 06/09/2016   PTSD (post-traumatic stress disorder)    Renal insufficiency 12/26/2012   Thyroid  disease     Outpatient Encounter Medications as of 10/19/2023  Medication Sig   acetaminophen  (TYLENOL ) 650 MG CR tablet Take 650 mg by mouth 3 (three) times daily as needed.   amitriptyline  (ELAVIL ) 50 MG tablet TAKE 1 TABLET BY MOUTH EVERY NIGHT AT BEDTIME   amLODipine  (NORVASC ) 2.5 MG tablet Take 1 tablet (2.5 mg total) by mouth daily.   carbamazepine  (CARBATROL ) 200 MG 12 hr capsule TAKE 1 CAPSULE BY MOUTH TWICE DAILY   levothyroxine  (SYNTHROID ) 112 MCG tablet Take 1 tablet (112 mcg total) by mouth daily before breakfast.   risperiDONE  (RISPERDAL ) 2 MG tablet Take 1 tablet (2 mg total) by mouth at bedtime.   Vitamin D , Ergocalciferol , (DRISDOL ) 1.25 MG (50000 UNIT) CAPS capsule Take 1 capsule (50,000 Units total) by mouth every 7 (seven) days.   No facility-administered encounter medications on file as of 10/19/2023.    Recent Results (from the past 2160 hours)  Carbamazepine  Level (Tegretol ), total     Status: None   Collection Time: 08/07/23  2:09 PM  Result Value Ref Range   Carbamazepine   Lvl 6.4 4.0 - 12.0 mg/L  CBC with Differential/Platelet     Status: Abnormal   Collection Time: 08/07/23  2:09 PM  Result Value Ref Range   WBC 7.2 3.8 - 10.8 Thousand/uL   RBC 4.72 3.80 - 5.10 Million/uL   Hemoglobin 15.6 (H) 11.7 - 15.5 g/dL   HCT 53.6 (H) 64.9 - 54.9 %   MCV 98.1 80.0 - 100.0 fL   MCH 33.1 (H) 27.0 - 33.0 pg   MCHC 33.7 32.0 - 36.0 g/dL    Comment: For adults, a slight decrease in the calculated MCHC value (in the range of 30 to 32 g/dL) is most likely not clinically significant; however, it should  be interpreted with caution in correlation with other red cell parameters and the patient's clinical condition.    RDW 12.6 11.0 - 15.0 %   Platelets 241 140 - 400 Thousand/uL   MPV 9.7 7.5 - 12.5 fL   Neutro Abs 5,285 1,500 - 7,800 cells/uL   Absolute Lymphocytes 1,123 850 - 3,900 cells/uL   Absolute Monocytes 626 200 - 950 cells/uL   Eosinophils Absolute 137 15 - 500 cells/uL   Basophils Absolute 29 0 - 200 cells/uL   Neutrophils Relative % 73.4 %   Total Lymphocyte 15.6 %   Monocytes Relative 8.7 %   Eosinophils Relative 1.9 %   Basophils Relative 0.4 %  HgB A1c     Status: Abnormal   Collection Time: 08/07/23  2:09 PM  Result Value Ref Range   Hgb A1c MFr Bld 5.9 (H) <5.7 %    Comment: For someone without known diabetes, a hemoglobin  A1c value between 5.7% and 6.4% is consistent with prediabetes and should be confirmed with a  follow-up test. . For someone with known diabetes, a value <7% indicates that their diabetes is well controlled. A1c targets should be individualized based on duration of diabetes, age, comorbid conditions, and other considerations. . This assay result is consistent with an increased risk of diabetes. . Currently, no consensus exists regarding use of hemoglobin A1c for diagnosis of diabetes for children. .    Mean Plasma Glucose 123 mg/dL   eAG (mmol/L) 6.8 mmol/L  Lipid panel     Status: Abnormal   Collection Time: 08/07/23  2:09 PM  Result Value Ref Range   Cholesterol 226 (H) <200 mg/dL   HDL 50 > OR = 50 mg/dL   Triglycerides 673 (H) <150 mg/dL    Comment: . If a non-fasting specimen was collected, consider repeat triglyceride testing on a fasting specimen if clinically indicated.  Veatrice et al. J. of Clin. Lipidol. 2015;9:129-169. SABRA    LDL Cholesterol (Calc) 129 (H) mg/dL (calc)    Comment: Reference range: <100 . Desirable range <100 mg/dL for primary prevention;   <70 mg/dL for patients with CHD or diabetic patients   with > or = 2 CHD risk factors. SABRA LDL-C is now calculated using the Martin-Hopkins  calculation, which is a validated novel method providing  better accuracy than the Friedewald equation in the  estimation of LDL-C.  Gladis APPLETHWAITE et al. SANDREA. 7986;689(80): 2061-2068  (http://education.[facility].com/faq/FAQ164)    Total CHOL/HDL Ratio 4.5 <5.0 (calc)   Non-HDL Cholesterol (Calc) 176 (H) <130 mg/dL (calc)    Comment: For patients with diabetes plus 1 major ASCVD risk  factor, treating to a non-HDL-C goal of <100 mg/dL  (LDL-C of <29 mg/dL) is considered a therapeutic  option.   TSH     Status: None  Collection Time: 08/07/23  2:09 PM  Result Value Ref Range   TSH 2.73 0.40 - 4.50 mIU/L  Vitamin B12     Status: None   Collection Time: 08/07/23  2:09 PM  Result Value Ref Range   Vitamin B-12 385 200 - 1,100 pg/mL    Comment: . Please Note: Although the reference range for vitamin B12 is 2480194371 pg/mL, it has been reported that between 5 and 10% of patients with values between 200 and 400 pg/mL may experience neuropsychiatric and hematologic abnormalities due to occult B12 deficiency; less than 1% of patients with values above 400 pg/mL will have symptoms. .   VITAMIN D  25 Hydroxy (Vit-D Deficiency, Fractures)     Status: Abnormal   Collection Time: 08/07/23  2:09 PM  Result Value Ref Range   Vit D, 25-Hydroxy 28 (L) 30 - 100 ng/mL    Comment: Vitamin D  Status         25-OH Vitamin D : . Deficiency:                    <20 ng/mL Insufficiency:             20 - 29 ng/mL Optimal:                 > or = 30 ng/mL . For 25-OH Vitamin D  testing on patients on  D2-supplementation and patients for whom quantitation  of D2 and D3 fractions is required, the [facility]ureD(TM) 25-OH VIT D, (D2,D3), LC/MS/MS is recommended: order  code 07111 (patients >39yrs). . See Note 1 . Note 1 . For additional information, please refer to  http://education.[facility].com/faq/FAQ199   (This link is being provided for informational/ educational purposes only.)   Comp Met (CMET)     Status: Abnormal   Collection Time: 08/07/23  2:10 PM  Result Value Ref Range   Glucose, Bld 133 (H) 65 - 99 mg/dL    Comment: .            Fasting reference interval . For someone without known diabetes, a glucose value >125 mg/dL indicates that they may have diabetes and this should be confirmed with a follow-up test. .    BUN 30 (H) 7 - 25 mg/dL   Creat 9.01 9.39 - 8.99 mg/dL   eGFR 61 > OR = 60 fO/fpw/8.26f7   BUN/Creatinine Ratio 31 (H) 6 - 22 (calc)   Sodium 142 135 - 146 mmol/L   Potassium 4.2 3.5 - 5.3 mmol/L   Chloride 108 98 - 110 mmol/L   CO2 22 20 - 32 mmol/L   Calcium  9.8 8.6 - 10.4 mg/dL   Total Protein 6.6 6.1 - 8.1 g/dL   Albumin 4.5 3.6 - 5.1 g/dL   Globulin 2.1 1.9 - 3.7 g/dL (calc)   AG Ratio 2.1 1.0 - 2.5 (calc)   Total Bilirubin 0.3 0.2 - 1.2 mg/dL   Alkaline phosphatase (APISO) 94 37 - 153 U/L   AST 13 10 - 35 U/L   ALT 15 6 - 29 U/L     Psychiatric Specialty Exam: Physical Exam  Review of Systems  Weight 201 lb (91.2 kg).There is no height or weight on file to calculate BMI.  General Appearance: Casual  Eye Contact:  Fair  Speech:  Normal Rate  Volume:  Normal  Mood:  Euthymic  Affect:  Appropriate  Thought Process:  Goal Directed  Orientation:  Full (Time, Place, and Person)  Thought Content:  Logical  Suicidal Thoughts:  No  Homicidal Thoughts:  No  Memory:  Immediate;   Good Recent;   Good Remote;   Good  Judgement:  Good  Insight:  Present  Psychomotor Activity:  Normal  Concentration:  Concentration: Good and Attention Span: Fair  Recall:  Good  Fund of Knowledge:  Good  Language:  Good  Akathisia:  No  Handed:  Right  AIMS (if indicated):     Assets:  Communication Skills Desire for Improvement Housing Transportation  ADL's:  Intact  Cognition:  WNL  Sleep:  ok       09/01/2022    1:11 PM 04/16/2022    3:45 PM  08/20/2021    2:30 PM 02/21/2021    4:00 PM 09/21/2018    9:36 AM  Depression screen PHQ 2/9  Decreased Interest 0 0 0 0 0  Down, Depressed, Hopeless 0 0 1 1 0  PHQ - 2 Score 0 0 1 1 0  Altered sleeping 0  0    Tired, decreased energy 0  0    Change in appetite 0  0    Feeling bad or failure about yourself  0  0    Trouble concentrating 0  0    Moving slowly or fidgety/restless 0  0    Suicidal thoughts 0  0    PHQ-9 Score 0  1    Difficult doing work/chores Not difficult at all  Not difficult at all      Assessment/Plan: Bipolar 1 disorder (HCC) - Plan: amitriptyline  (ELAVIL ) 50 MG tablet, carbamazepine  (CARBATROL ) 200 MG 12 hr capsule, risperiDONE  (RISPERDAL ) 2 MG tablet  Anxiety - Plan: amitriptyline  (ELAVIL ) 50 MG tablet  Patient is stable on current medication.  Reviewed blood work results and notes from primary care.  Her Tegretol  level is 6.4 which was done in June 2025.  She has no major side effects of the medication.  Continue risperidone  2 mg at bedtime, Tegretol  200 mg twice a day and amitriptyline  50 mg at bedtime.  She is not interested in therapy.  Recommend to call back if she has any question or any concern.  Follow-up in 3 months.   Follow Up Instructions:     I discussed the assessment and treatment plan with the patient. The patient was provided an opportunity to ask questions and all were answered. The patient agreed with the plan and demonstrated an understanding of the instructions.   The patient was advised to call back or seek an in-person evaluation if the symptoms worsen or if the condition fails to improve as anticipated.    Collaboration of Care: Other provider involved in patient's care AEB notes are available in epic to review  Patient/Guardian was advised Release of Information must be obtained prior to any record release in order to collaborate their care with an outside provider. Patient/Guardian was advised if they have not already done so to  contact the registration department to sign all necessary forms in order for us  to release information regarding their care.   Consent: Patient/Guardian gives verbal consent for treatment and assignment of benefits for services provided during this visit. Patient/Guardian expressed understanding and agreed to proceed.     Total encounter time 18 minutes which includes face-to-face time, chart reviewed, care coordination, order entry and documentation during this encounter.   Note: This document was prepared by [employer] voice dictation technology and any errors that results from this process are unintentional.    Leni ONEIDA Client, MD 10/19/2023

## 2023-10-23 ENCOUNTER — Ambulatory Visit (INDEPENDENT_AMBULATORY_CARE_PROVIDER_SITE_OTHER): Admitting: *Deleted

## 2023-10-23 ENCOUNTER — Telehealth: Payer: Self-pay | Admitting: *Deleted

## 2023-10-23 ENCOUNTER — Other Ambulatory Visit: Payer: Self-pay | Admitting: *Deleted

## 2023-10-23 VITALS — Ht 64.0 in | Wt 201.0 lb

## 2023-10-23 DIAGNOSIS — Z5982 Transportation insecurity: Secondary | ICD-10-CM

## 2023-10-23 DIAGNOSIS — Z5941 Food insecurity: Secondary | ICD-10-CM | POA: Diagnosis not present

## 2023-10-23 DIAGNOSIS — Z780 Asymptomatic menopausal state: Secondary | ICD-10-CM

## 2023-10-23 DIAGNOSIS — Z0000 Encounter for general adult medical examination without abnormal findings: Secondary | ICD-10-CM

## 2023-10-23 DIAGNOSIS — M8580 Other specified disorders of bone density and structure, unspecified site: Secondary | ICD-10-CM | POA: Diagnosis not present

## 2023-10-23 NOTE — Patient Instructions (Addendum)
 Ms. Felling , Thank you for taking time out of your busy schedule to complete your Annual Wellness Visit with me. I enjoyed our conversation and look forward to speaking with you again next year. I, as well as your care team,  appreciate your ongoing commitment to your health goals. Please review the following plan we discussed and let me know if I can assist you in the future. Your Game plan/ To Do List   Referrals: If you haven't heard from the office you've been referred to, please reach out to them at the phone provided.   Mammogram / Bone Density at [facility] (at your earliest convenience):  [phone]  Social Work referral (transportation / food assistance):  please let us  know if you have not been contacted about this referral in 3-4 weeks.  Follow up Visits: Next Medicare AWV with our clinical staff: 10/26/24 1:40pm, telephone.  Next Office Visit with your provider: 11/05/23 3pm, Dr Domenica (physical)  Clinician Recommendations:  Aim for 30 minutes of exercise or brisk walking, 6-8 glasses of water, and 5 servings of fruits and vegetables each day.       This is a list of the screening recommended for you and due dates:  Health Maintenance  Topic Date Due   Zoster (Shingles) Vaccine (1 of 2) Never done   Medicare Annual Wellness Visit  04/16/2023   Breast Cancer Screening  04/23/2023   Flu Shot  09/11/2023   Pneumococcal Vaccine for age over 97 (1 of 1 - PCV) 03/04/2024*   DTaP/Tdap/Td vaccine (3 - Td or Tdap) 09/20/2028   DEXA scan (bone density measurement)  Completed   Hepatitis C Screening  Completed   HPV Vaccine  Aged Out   Meningitis B Vaccine  Aged Out   Colon Cancer Screening  Discontinued   COVID-19 Vaccine  Discontinued   Cologuard (Stool DNA test)  Discontinued  *Topic was postponed. The date shown is not the original due date.    Advanced directives: (In Chart) A copy of your advanced directives are scanned into your chart should your provider ever need  it. Advance Care Planning is important because it:  [x]  Makes sure you receive the medical care that is consistent with your values, goals, and preferences  [x]  It provides guidance to your family and loved ones and reduces their decisional burden about whether or not they are making the right decisions based on your wishes.  Follow the link provided in your after visit summary or read over the paperwork we have mailed to you to help you started getting your Advance Directives in place. If you need assistance in completing these, please reach out to us  so that we can help you!  See attachments for Preventive Care and Fall Prevention Tips.

## 2023-10-23 NOTE — Progress Notes (Signed)
 Please attest this visit in the absence of patient primary care provider.    Subjective:   Margaret [name] is a 73 y.o. who presents for a Medicare Wellness preventive visit.  As a reminder, Annual Wellness Visits don't include a physical exam, and some assessments may be limited, especially if this visit is performed virtually. We may recommend an in-person follow-up visit with your provider if needed.  Visit Complete: Virtual I connected with  Margaret [name] on 10/23/23 by a audio enabled telemedicine application and verified that I am speaking with the correct person using two identifiers.  Patient Location: Home  Provider Location: Office/Clinic  I discussed the limitations of evaluation and management by telemedicine. The patient expressed understanding and agreed to proceed.  Vital Signs: Because this visit was a virtual/telehealth visit, some criteria may be missing or patient reported. Any vitals not documented were not able to be obtained and vitals that have been documented are patient reported.  VideoDeclined- This patient declined Librarian, academic. Therefore the visit was completed with audio only.  Persons Participating in Visit: Patient.  AWV Questionnaire: Yes: Patient Medicare AWV questionnaire was completed by the patient on 10/17/23; I have confirmed that all information answered by patient is correct and no changes since this date.  Cardiac Risk Factors include: advanced age (>21men, >68 women);hypertension;dyslipidemia     Objective:    Today's Vitals   10/23/23 1341  Weight: 201 lb (91.2 kg)  Height: 5' 4 (1.626 m)   Body mass index is 34.5 kg/m.     10/23/2023    2:07 PM 04/16/2022    3:42 PM 02/21/2021    3:48 PM 08/26/2016    3:00 PM 08/09/2016    7:53 AM  Advanced Directives  Does Patient Have a Medical Advance Directive? Yes Yes Yes Yes  Yes   Type of Advance Directive Living will Healthcare Power of [place];Living will  Healthcare Power of [place];Living will  Living will  Does patient want to make changes to medical advance directive? No - Patient declined No - Patient declined     Copy of Healthcare Power of Attorney in Chart?  Yes - validated most recent copy scanned in chart (See row information) No - copy requested       Data saved with a previous flowsheet row definition    Current Medications (verified) Outpatient Encounter Medications as of 10/23/2023  Medication Sig   acetaminophen  (TYLENOL ) 650 MG CR tablet Take 650 mg by mouth 3 (three) times daily as needed.   amitriptyline  (ELAVIL ) 50 MG tablet TAKE 1 TABLET BY MOUTH EVERY NIGHT AT BEDTIME   carbamazepine  (CARBATROL ) 200 MG 12 hr capsule TAKE 1 CAPSULE BY MOUTH TWICE DAILY   levothyroxine  (SYNTHROID ) 112 MCG tablet Take 1 tablet (112 mcg total) by mouth daily before breakfast.   risperiDONE  (RISPERDAL ) 2 MG tablet Take 1 tablet (2 mg total) by mouth at bedtime.   amLODipine  (NORVASC ) 2.5 MG tablet Take 1 tablet (2.5 mg total) by mouth daily.   No facility-administered encounter medications on file as of 10/23/2023.    Allergies (verified) Codeine, Darvon, Fentanyl , Novocain [procaine], and Sulfa drugs cross reactors   History: Past Medical History:  Diagnosis Date   Anxiety    Arthritis 02/14/2013   Bipolar disorder (HCC)    Depression 1961   depression & anxiety all my life   Esophageal reflux 09/19/2012   only slight with gallbladder problems   Essential hypertension 09/28/2018   Hypoglycemia  Hypothyroidism    Other malaise and fatigue 09/19/2012   PONV (postoperative nausea and vomiting)    Preventative health care 06/09/2016   Preventative health care 06/09/2016   PTSD (post-traumatic stress disorder)    Renal insufficiency 12/26/2012   Thyroid  disease    Past Surgical History:  Procedure Laterality Date   CHOLECYSTECTOMY N/A 09/01/2016   Procedure: LAPAROSCOPIC CHOLECYSTECTOMY WITH INTRAOPERATIVE CHOLANGIOGRAM;   Surgeon: Curvin Deward MOULD, MD;  Location: [facility] OR;  Service: General;  Laterality: N/A;   DG 4TH DIGIT LEFT FOOT     EYE SURGERY     when i was a child   fooet surgery Left 2014   reconstruction of foot and ankle to correct deformity   NECK SURGERY     OTHER SURGICAL HISTORY     OTHER SURGICAL HISTORY     OTHER SURGICAL HISTORY     TUBAL LIGATION     reversal   Family History  Problem Relation Age of Onset   Diabetes Mother        Brother 1 of 2   Heart failure Mother    Arthritis Mother    Depression Mother    Early death Mother    Heart disease Mother    Prostate cancer Father    Parkinsonism Father        deceased   Hearing loss Father    Hypertension Brother    ADD / ADHD Brother    Anxiety disorder Brother    Esophageal cancer Maternal Grandmother    Cancer Paternal Grandmother        cancer of jawbone 1 of 2   Melanoma Brother    Alcohol abuse Brother    Anxiety disorder Brother    Diabetes Brother    Hearing loss Brother    Breast cancer Neg Hx    Colon cancer Neg Hx    Social History   Socioeconomic History   Marital status: Divorced    Spouse name: Not on file   Number of children: Not on file   Years of education: Not on file   Highest education level: Associate degree: occupational, Scientist, product/process development, or vocational program  Occupational History   Not on file  Tobacco Use   Smoking status: Former    Current packs/day: 0.00    Average packs/day: 2.0 packs/day for 10.0 years (20.0 ttl pk-yrs)    Types: Cigarettes    Start date: 02/11/1983    Quit date: 02/10/1993    Years since quitting: 30.7   Smokeless tobacco: Never   Tobacco comments:    will never smoke again  Vaping Use   Vaping status: Never Used  Substance and Sexual Activity   Alcohol use: No   Drug use: No   Sexual activity: Not Currently    Birth control/protection: Abstinence  Other Topics Concern   Not on file  Social History Narrative   Lives alone. No children. Dogs-2 pitbulls   Social  Drivers of Health   Financial Resource Strain: Medium Risk (10/23/2023)   Overall Financial Resource Strain (CARDIA)    Difficulty of Paying Living Expenses: Somewhat hard  Food Insecurity: No Food Insecurity (10/23/2023)   Hunger Vital Sign    Worried About Running Out of Food in the Last Year: Never true    Ran Out of Food in the Last Year: Never true  Recent Concern: Food Insecurity - Food Insecurity Present (08/01/2023)   Hunger Vital Sign    Worried About Running Out of Food in the  Last Year: Sometimes true    Ran Out of Food in the Last Year: Sometimes true  Transportation Needs: Unmet Transportation Needs (10/23/2023)   PRAPARE - Administrator, Civil Service (Medical): Yes    Lack of Transportation (Non-Medical): No  Physical Activity: Sufficiently Active (10/23/2023)   Exercise Vital Sign    Days of Exercise per Week: 7 days    Minutes of Exercise per Session: 30 min  Stress: No Stress Concern Present (10/23/2023)   [employer] of Occupational Health - Occupational Stress Questionnaire    Feeling of Stress: Not at all  Social Connections: Socially Isolated (10/23/2023)   Social Connection and Isolation Panel    Frequency of Communication with Friends and Family: More than three times a week    Frequency of Social Gatherings with Friends and Family: Once a week    Attends Religious Services: Never    Database administrator or Organizations: No    Attends Engineer, structural: Never    Marital Status: Divorced    Tobacco Counseling Counseling given: Not Answered Tobacco comments: will never smoke again    Clinical Intake:  Pre-visit preparation completed: Yes        BMI - recorded: 34.5 Nutritional Status: BMI > 30  Obese Nutritional Risks: None Diabetes: No  Lab Results  Component Value Date   HGBA1C 5.9 (H) 08/07/2023   HGBA1C 6.0 03/05/2023   HGBA1C 5.8 09/01/2022     How often do you need to have someone help you when you read  instructions, pamphlets, or other written materials from your doctor or pharmacy?: 1 - Never What is the last grade level you completed in school?: associate's degree  Interpreter Needed?: No  Information entered by :: Andrzej Scully, CMA (AAMA)   Activities of Daily Living     10/17/2023    3:16 PM  In your present state of health, do you have any difficulty performing the following activities:  Hearing? 0  Vision? 0  Difficulty concentrating or making decisions? 0  Walking or climbing stairs? 0  Dressing or bathing? 0  Doing errands, shopping? 1  Comment can't afford vehicle at present  Preparing Food and eating ? N  Using the Toilet? N  In the past six months, have you accidently leaked urine? N  Do you have problems with loss of bowel control? N  Managing your Medications? N  Managing your Finances? N  Housekeeping or managing your Housekeeping? N    Patient Care Team: Domenica Harlene LABOR, MD as PCP - General (Family Medicine) [facility])  I have updated your Care Teams any recent Medical Services you may have received from other providers in the past year.     Assessment:   This is a routine wellness examination for Daissy.  Hearing/Vision screen Hearing Screening - Comments:: Has hearing aids Vision Screening - Comments:: [facility] [place]   Goals Addressed   None    Depression Screen     10/23/2023    1:54 PM 10/23/2023    1:53 PM 09/01/2022    1:11 PM 04/16/2022    3:45 PM 08/20/2021    2:30 PM 02/21/2021    4:00 PM 09/21/2018    9:36 AM  PHQ 2/9 Scores  PHQ - 2 Score 0 0 0 0 1 1 0  PHQ- 9 Score 3  0  1      Fall Risk     10/17/2023    3:16 PM 09/01/2022  1:10 PM 04/16/2022    3:42 PM 08/20/2021    2:30 PM 02/21/2021    3:51 PM  Fall Risk   Falls in the past year? 0 0 0 1 1  Number falls in past yr: 0 0 0 1 0  Injury with Fall? 0 0 0 1 0  Risk for fall due to :   No Fall Risks History of fall(s)   Follow up  Falls evaluation  completed Falls evaluation completed Falls evaluation completed  Falls prevention discussed      Data saved with a previous flowsheet row definition    MEDICARE RISK AT HOME:  Medicare Risk at Home Any stairs in or around the home?: (Patient-Rptd) No Home free of loose throw rugs in walkways, pet beds, electrical cords, etc?: (Patient-Rptd) No Adequate lighting in your home to reduce risk of falls?: (Patient-Rptd) Yes Life alert?: (Patient-Rptd) No Use of a cane, walker or w/c?: (Patient-Rptd) Yes Grab bars in the bathroom?: (Patient-Rptd) Yes Shower chair or bench in shower?: (Patient-Rptd) No Elevated toilet seat or a handicapped toilet?: (Patient-Rptd) No  TIMED UP AND GO:  Was the test performed?  No,audio  Cognitive Function: 6CIT completed        10/23/2023    2:09 PM 04/16/2022    3:48 PM  6CIT Screen  What Year? 0 points 0 points  What month? 0 points 0 points  What time? 0 points 0 points  Count back from 20 0 points 0 points  Months in reverse 0 points 0 points  Repeat phrase 0 points 0 points  Total Score 0 points 0 points    Immunizations Immunization History  Administered Date(s) Administered   Influenza Split 11/11/2011   Influenza-Unspecified 12/13/2012   PPD Test 11/11/2011, 01/11/2021   Td 02/11/2008   Tdap 09/21/2018    Screening Tests Health Maintenance  Topic Date Due   Mammogram  04/23/2023   Pneumococcal Vaccine: 50+ Years (1 of 1 - PCV) 03/04/2024 (Originally 09/15/2000)   Influenza Vaccine  05/10/2024 (Originally 09/11/2023)   Zoster Vaccines- Shingrix (1 of 2) 10/22/2024 (Originally 09/15/1969)   Medicare Annual Wellness (AWV)  10/22/2024   DTaP/Tdap/Td (3 - Td or Tdap) 09/20/2028   DEXA SCAN  Completed   Hepatitis C Screening  Completed   HPV VACCINES  Aged Out   Meningococcal B Vaccine  Aged Out   Colonoscopy  Discontinued   COVID-19 Vaccine  Discontinued   Fecal DNA (Cologuard)  Discontinued    Health Maintenance Items  Addressed: Flu vaccine, mammogram, DEXA all pending transportation for pt.  Additional Screening:  Vision Screening: Recommended annual ophthalmology exams for early detection of glaucoma and other disorders of the eye. Is the patient up to date with their annual eye exam?  Yes  Who is the provider or what is the name of the office in which the patient attends annual eye exams? [facility]  Dental Screening: Recommended annual dental exams for proper oral hygiene  Community Resource Referral / Chronic Care Management: CRR required this visit?  Yes   CCM required this visit?  No   Plan:    I have personally reviewed and noted the following in the patient's chart:   Medical and social history Use of alcohol, tobacco or illicit drugs  Current medications and supplements including opioid prescriptions. Patient is not currently taking opioid prescriptions. Functional ability and status Nutritional status Physical activity Advanced directives List of other physicians Hospitalizations, surgeries, and ER visits in previous 12 months Vitals  Screenings to include cognitive, depression, and falls Referrals and appointments  In addition, I have reviewed and discussed with patient certain preventive protocols, quality metrics, and best practice recommendations. A written personalized care plan for preventive services as well as general preventive health recommendations were provided to patient.   Lolita Libra, CMA   10/23/2023   After Visit Summary: (MyChart) Due to this being a telephonic visit, the after visit summary with patients personalized plan was offered to patient via MyChart   Notes: see phone note

## 2023-10-23 NOTE — Telephone Encounter (Signed)
 Pt states she was prescribed blood pressure medication in July and has not started it. She is requesting that we remove Amlodipine  from her medication list. States BP readings at home are always within normal range.  States that she has white coat syndrome and never wants the automated blood pressure machine used on her again as she felt it was abnormally tight. Last 3 home BP readings are: 10/30/23  124/70, pulse 88.  09/29/23  124/74, pulse 77.  09/27/23 121/76, pulse 83.  Pt has follow up with PCP on 11/05/23 and has been advised to bring her home monitor with her for comparison to in office readings and she voices understanding.

## 2023-10-24 ENCOUNTER — Other Ambulatory Visit: Payer: Self-pay | Admitting: Student

## 2023-10-26 ENCOUNTER — Other Ambulatory Visit: Payer: Self-pay | Admitting: Family Medicine

## 2023-11-01 NOTE — Assessment & Plan Note (Deleted)
 Encourage heart healthy diet such as MIND or DASH diet, increase exercise, avoid trans fats, simple carbohydrates and processed foods, consider a krill or fish or flaxseed oil cap daily.

## 2023-11-01 NOTE — Assessment & Plan Note (Deleted)
 On Levothyroxine, continue to monitor

## 2023-11-01 NOTE — Assessment & Plan Note (Deleted)
Patient encouraged to maintain heart healthy diet, regular exercise, adequate sleep. Consider daily probiotics. Take medications as prescribed. Labs ordered and reviewed. MGM and Dexa in March of 2023. Repeat mgm in 1-2 years. Dexa in 2-5 years. Declines all colonoscopy and further paps due to pain

## 2023-11-01 NOTE — Assessment & Plan Note (Deleted)
.  stable on current meds  

## 2023-11-01 NOTE — Assessment & Plan Note (Deleted)
 Well controlled, no changes to meds. Encouraged heart healthy diet such as the DASH diet and exercise as tolerated.

## 2023-11-01 NOTE — Assessment & Plan Note (Deleted)
 Hydrate and monitor

## 2023-11-01 NOTE — Assessment & Plan Note (Deleted)
 hgba1c acceptable, minimize simple carbs. Increase exercise as tolerated.

## 2023-11-05 ENCOUNTER — Encounter: Payer: No Typology Code available for payment source | Admitting: Family Medicine

## 2023-11-05 DIAGNOSIS — N289 Disorder of kidney and ureter, unspecified: Secondary | ICD-10-CM

## 2023-11-05 DIAGNOSIS — R739 Hyperglycemia, unspecified: Secondary | ICD-10-CM

## 2023-11-05 DIAGNOSIS — F3111 Bipolar disorder, current episode manic without psychotic features, mild: Secondary | ICD-10-CM

## 2023-11-05 DIAGNOSIS — E785 Hyperlipidemia, unspecified: Secondary | ICD-10-CM

## 2023-11-05 DIAGNOSIS — I10 Essential (primary) hypertension: Secondary | ICD-10-CM

## 2023-11-05 DIAGNOSIS — E039 Hypothyroidism, unspecified: Secondary | ICD-10-CM

## 2023-11-05 DIAGNOSIS — Z0000 Encounter for general adult medical examination without abnormal findings: Secondary | ICD-10-CM

## 2023-11-09 ENCOUNTER — Telehealth (HOSPITAL_BASED_OUTPATIENT_CLINIC_OR_DEPARTMENT_OTHER): Payer: Self-pay

## 2024-01-18 ENCOUNTER — Encounter (HOSPITAL_COMMUNITY): Payer: Self-pay | Admitting: Psychiatry

## 2024-01-18 ENCOUNTER — Telehealth (HOSPITAL_COMMUNITY): Admitting: Psychiatry

## 2024-01-18 VITALS — Wt 201.0 lb

## 2024-01-18 DIAGNOSIS — F419 Anxiety disorder, unspecified: Secondary | ICD-10-CM

## 2024-01-18 DIAGNOSIS — F319 Bipolar disorder, unspecified: Secondary | ICD-10-CM

## 2024-01-18 MED ORDER — RISPERIDONE 2 MG PO TABS: 2.0000 mg | ORAL_TABLET | Freq: Every day | ORAL | 0 refills | Status: AC

## 2024-01-18 MED ORDER — AMITRIPTYLINE HCL 50 MG PO TABS: ORAL_TABLET | Freq: Every day | ORAL | 0 refills | Status: AC

## 2024-01-18 MED ORDER — CARBAMAZEPINE ER 200 MG PO CP12: ORAL_CAPSULE | Freq: Two times a day (BID) | ORAL | 0 refills | Status: AC

## 2024-01-18 NOTE — Progress Notes (Signed)
 [facility] MD Virtual Progress Note   Patient Location: Home Provider Location: Home Office  I connect with patient by video and verified that I am speaking with correct person by using two identifiers. I discussed the limitations of evaluation and management by telemedicine and the availability of in person appointments. I also discussed with the patient that there may be a patient responsible charge related to this service. The patient expressed understanding and agreed to proceed.  Margaret [name] [id number] 73 y.o.  01/18/2024 3:25 PM  History of Present Illness:  Patient is evaluated by video session.  She was not able to see me on the video but I was able to see her and talk to her.  She reported things are unchanged from the past.  She is still have chronic pain and have difficulty walking.  She has no transportation and sometimes she is asking help from her neighbors or sister-in-law but lately sister-in-law is also have multiple health issues.  She tried to get ride from her insurance but they did not approved.  Patient checks her blood pressure at home.  She has to cancel her appointment with the primary care because no one was able to take take her to the doctor's appointment.  However she is taking all her medication as prescribed.  She sleeps good but usually she sleeps on recliner.  She denies any panic attack, crying spells or feeling of hopelessness or worthlessness.  She denies any agitation or any active or passive suicidal thoughts.  She reported medicine working and helping her mania.  She is compliant with amitriptyline , risperidone  and carbamazepine .  She denies drinking or using any illegal substances.  Past Psychiatric History: H/O anxiety, bipolar d/o and inpatient in 2009.  No h/o suicidal attempt.  Lithium  worked well but discontinued due to high creatinine.      Past Medical History:  Diagnosis Date   Anxiety    Arthritis 02/14/2013   Bipolar disorder  (HCC)    Depression 1961   depression & anxiety all my life   Esophageal reflux 09/19/2012   only slight with gallbladder problems   Essential hypertension 09/28/2018   Hypoglycemia    Hypothyroidism    Other malaise and fatigue 09/19/2012   PONV (postoperative nausea and vomiting)    Preventative health care 06/09/2016   Preventative health care 06/09/2016   PTSD (post-traumatic stress disorder)    Renal insufficiency 12/26/2012   Thyroid  disease     Outpatient Encounter Medications as of 01/18/2024  Medication Sig   levothyroxine  (SYNTHROID ) 112 MCG tablet Take 1 tablet (112 mcg total) by mouth daily before breakfast.   acetaminophen  (TYLENOL ) 650 MG CR tablet Take 650 mg by mouth 3 (three) times daily as needed.   amitriptyline  (ELAVIL ) 50 MG tablet TAKE 1 TABLET BY MOUTH EVERY NIGHT AT BEDTIME   amLODipine  (NORVASC ) 2.5 MG tablet TAKE 1 TABLET(2.5 MG) BY MOUTH DAILY   carbamazepine  (CARBATROL ) 200 MG 12 hr capsule TAKE 1 CAPSULE BY MOUTH TWICE DAILY   risperiDONE  (RISPERDAL ) 2 MG tablet Take 1 tablet (2 mg total) by mouth at bedtime.   No facility-administered encounter medications on file as of 01/18/2024.    No results found for this or any previous visit (from the past 2160 hours).    Psychiatric Specialty Exam: Physical Exam  Review of Systems  Weight 201 lb (91.2 kg).There is no height or weight on file to calculate BMI.  General Appearance: Casual  Eye Contact:  Fair  Speech:  Normal Rate  Volume:  Normal  Mood:  Anxious  Affect:  Labile  Thought Process:  Goal Directed  Orientation:  Full (Time, Place, and Person)  Thought Content:  Logical  Suicidal Thoughts:  No  Homicidal Thoughts:  No  Memory:  Immediate;   Good Recent;   Good Remote;   Good  Judgement:  Good  Insight:  Present  Psychomotor Activity:  Normal  Concentration:  Concentration: Good and Attention Span: Fair  Recall:  Good  Fund of Knowledge:  Good  Language:  Good  Akathisia:  No   Handed:  Right  AIMS (if indicated):     Assets:  Communication Skills Desire for Improvement Housing Transportation  ADL's:  Intact  Cognition:  WNL  Sleep:  ok       10/23/2023    1:54 PM 10/23/2023    1:53 PM 09/01/2022    1:11 PM 04/16/2022    3:45 PM 08/20/2021    2:30 PM  Depression screen PHQ 2/9  Decreased Interest 0 0 0 0 0  Down, Depressed, Hopeless 0 0 0 0 1  PHQ - 2 Score 0 0 0 0 1  Altered sleeping 3  0  0  Tired, decreased energy 0  0  0  Change in appetite 0  0  0  Feeling bad or failure about yourself  0  0  0  Trouble concentrating 0  0  0  Moving slowly or fidgety/restless 0  0  0  Suicidal thoughts 0  0  0  PHQ-9 Score 3   0   1   Difficult doing work/chores   Not difficult at all  Not difficult at all     Data saved with a previous flowsheet row definition    Assessment/Plan: Bipolar 1 disorder (HCC) - Plan: carbamazepine  (CARBATROL ) 200 MG 12 hr capsule, amitriptyline  (ELAVIL ) 50 MG tablet, risperiDONE  (RISPERDAL ) 2 MG tablet  Anxiety - Plan: amitriptyline  (ELAVIL ) 50 MG tablet  Patient is 73 year old Caucasian female with history of hypertension, acid reflux, hypothyroidism, bipolar disorder and anxiety disorder.  Reviewed current medication.  She has not seen PCP or had blood work in a while.  Today her camera was working on and off.  I was able to see her but she has difficulty seeing me but we were able to talk without any problem.  She like the current medication.  She has no tremor or shakes or any EPS.  I will continue risperidone  2 mg at bedtime, Tegretol  200 mg twice a day and amitriptyline  50 mg at bedtime.  I recommend to have next visit in person so we can get labs.  Will follow-up in 3 months.  I encouraged to call back if she has any question or any concern or need a sooner appointment.  Follow Up Instructions:     I discussed the assessment and treatment plan with the patient. The patient was provided an opportunity to ask questions and all  were answered. The patient agreed with the plan and demonstrated an understanding of the instructions.   The patient was advised to call back or seek an in-person evaluation if the symptoms worsen or if the condition fails to improve as anticipated.    Collaboration of Care: Other provider involved in patient's care AEB notes are available in epic to review  Patient/Guardian was advised Release of Information must be obtained prior to any record release in order to collaborate their care with an outside provider.  Patient/Guardian was advised if they have not already done so to contact the registration department to sign all necessary forms in order for us  to release information regarding their care.   Consent: Patient/Guardian gives verbal consent for treatment and assignment of benefits for services provided during this visit. Patient/Guardian expressed understanding and agreed to proceed.     Total encounter time 16 minutes which includes face-to-face time, chart reviewed, care coordination, order entry and documentation during this encounter.   Note: This document was prepared by [employer] voice dictation technology and any errors that results from this process are unintentional.    Leni ONEIDA Client, MD 01/18/2024

## 2024-01-20 ENCOUNTER — Other Ambulatory Visit: Payer: Self-pay | Admitting: Family Medicine

## 2024-02-08 ENCOUNTER — Ambulatory Visit: Admitting: Family Medicine

## 2024-04-21 ENCOUNTER — Ambulatory Visit (HOSPITAL_COMMUNITY): Admitting: Psychiatry

## 2024-05-23 ENCOUNTER — Encounter: Admitting: Family Medicine

## 2024-07-21 ENCOUNTER — Encounter: Admitting: Family Medicine

## 2024-08-22 ENCOUNTER — Encounter: Admitting: Family Medicine

## 2024-09-22 ENCOUNTER — Encounter: Admitting: Family Medicine

## 2024-10-26 ENCOUNTER — Ambulatory Visit
